# Patient Record
Sex: Male | Born: 1935 | Race: White | Hispanic: No | Marital: Married | Smoking: Former smoker
Health system: Southern US, Community
[De-identification: ages and names within clinical notes are randomized; demographics above are authoritative.]

## PROBLEM LIST (undated history)

## (undated) DIAGNOSIS — H3530 Unspecified macular degeneration: Secondary | ICD-10-CM

## (undated) DIAGNOSIS — N400 Enlarged prostate without lower urinary tract symptoms: Secondary | ICD-10-CM

## (undated) DIAGNOSIS — Z9289 Personal history of other medical treatment: Secondary | ICD-10-CM

## (undated) DIAGNOSIS — M1990 Unspecified osteoarthritis, unspecified site: Secondary | ICD-10-CM

## (undated) DIAGNOSIS — E785 Hyperlipidemia, unspecified: Secondary | ICD-10-CM

## (undated) DIAGNOSIS — K56609 Unspecified intestinal obstruction, unspecified as to partial versus complete obstruction: Principal | ICD-10-CM

## (undated) DIAGNOSIS — R803 Bence Jones proteinuria: Secondary | ICD-10-CM

## (undated) DIAGNOSIS — R531 Weakness: Secondary | ICD-10-CM

## (undated) DIAGNOSIS — G629 Polyneuropathy, unspecified: Secondary | ICD-10-CM

## (undated) DIAGNOSIS — J90 Pleural effusion, not elsewhere classified: Secondary | ICD-10-CM

## (undated) DIAGNOSIS — I2510 Atherosclerotic heart disease of native coronary artery without angina pectoris: Secondary | ICD-10-CM

## (undated) DIAGNOSIS — R569 Unspecified convulsions: Secondary | ICD-10-CM

## (undated) DIAGNOSIS — R809 Proteinuria, unspecified: Secondary | ICD-10-CM

## (undated) HISTORY — DX: Proteinuria, unspecified: R80.9

## (undated) HISTORY — DX: Pleural effusion, not elsewhere classified: J90

## (undated) HISTORY — DX: Atherosclerotic heart disease of native coronary artery without angina pectoris: I25.10

## (undated) HISTORY — DX: Hyperlipidemia, unspecified: E78.5

## (undated) HISTORY — DX: Unspecified intestinal obstruction, unspecified as to partial versus complete obstruction: K56.609

## (undated) HISTORY — DX: Weakness: R53.1

## (undated) HISTORY — DX: Unspecified convulsions: R56.9

## (undated) HISTORY — DX: Bence Jones proteinuria: R80.3

---
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
## 1990-09-20 HISTORY — PX: TRANSURETHRAL RESECTION OF PROSTATE: SHX73

## 1995-09-21 HISTORY — PX: ANKLE FRACTURE SURGERY: SHX122

## 1998-09-20 HISTORY — PX: HAND SURGERY: SHX662

## 1999-07-07 ENCOUNTER — Ambulatory Visit (HOSPITAL_BASED_OUTPATIENT_CLINIC_OR_DEPARTMENT_OTHER): Admission: RE | Admit: 1999-07-07 | Discharge: 1999-07-07 | Payer: Self-pay | Admitting: Orthopedic Surgery

## 2002-01-18 ENCOUNTER — Ambulatory Visit (HOSPITAL_COMMUNITY): Admission: RE | Admit: 2002-01-18 | Discharge: 2002-01-18 | Payer: Self-pay | Admitting: Gastroenterology

## 2002-01-18 ENCOUNTER — Encounter (INDEPENDENT_AMBULATORY_CARE_PROVIDER_SITE_OTHER): Payer: Self-pay | Admitting: Specialist

## 2002-09-20 DIAGNOSIS — Z9289 Personal history of other medical treatment: Secondary | ICD-10-CM

## 2002-09-20 HISTORY — DX: Personal history of other medical treatment: Z92.89

## 2002-09-20 HISTORY — PX: CORONARY ARTERY BYPASS GRAFT: SHX141

## 2003-01-19 DIAGNOSIS — I2510 Atherosclerotic heart disease of native coronary artery without angina pectoris: Secondary | ICD-10-CM

## 2003-01-19 HISTORY — DX: Atherosclerotic heart disease of native coronary artery without angina pectoris: I25.10

## 2003-01-27 ENCOUNTER — Encounter: Payer: Self-pay | Admitting: Emergency Medicine

## 2003-01-27 ENCOUNTER — Inpatient Hospital Stay (HOSPITAL_COMMUNITY): Admission: EM | Admit: 2003-01-27 | Discharge: 2003-02-02 | Payer: Self-pay | Admitting: Emergency Medicine

## 2003-01-27 HISTORY — PX: CARDIAC CATHETERIZATION: SHX172

## 2003-01-28 ENCOUNTER — Encounter: Payer: Self-pay | Admitting: Thoracic Surgery (Cardiothoracic Vascular Surgery)

## 2003-01-28 ENCOUNTER — Encounter: Payer: Self-pay | Admitting: Cardiothoracic Surgery

## 2003-01-29 ENCOUNTER — Encounter: Payer: Self-pay | Admitting: Thoracic Surgery (Cardiothoracic Vascular Surgery)

## 2003-01-30 ENCOUNTER — Encounter: Payer: Self-pay | Admitting: Thoracic Surgery (Cardiothoracic Vascular Surgery)

## 2003-02-01 ENCOUNTER — Encounter: Payer: Self-pay | Admitting: Thoracic Surgery (Cardiothoracic Vascular Surgery)

## 2003-02-25 ENCOUNTER — Encounter (HOSPITAL_COMMUNITY): Admission: RE | Admit: 2003-02-25 | Discharge: 2003-05-26 | Payer: Self-pay | Admitting: Cardiology

## 2010-07-30 ENCOUNTER — Ambulatory Visit: Payer: Self-pay | Admitting: Cardiology

## 2011-01-13 ENCOUNTER — Telehealth: Payer: Self-pay | Admitting: Cardiology

## 2011-01-13 DIAGNOSIS — E785 Hyperlipidemia, unspecified: Secondary | ICD-10-CM

## 2011-01-13 MED ORDER — ATORVASTATIN CALCIUM 40 MG PO TABS: 40.0000 mg | ORAL_TABLET | Freq: Every day | ORAL | Status: DC

## 2011-01-13 NOTE — Telephone Encounter (Signed)
Patient would like to pick up a prescription for Lipitor. He also has a question regarding the generic of Lipitor

## 2011-01-13 NOTE — Telephone Encounter (Signed)
Called wanting RX for Lipitor so he can mail to [facility]. Not sure if he wants generic. Will print Rx to mail to pt.

## 2011-01-22 ENCOUNTER — Other Ambulatory Visit: Payer: Self-pay | Admitting: Cardiology

## 2011-01-22 NOTE — Telephone Encounter (Signed)
LM that he did not get Rx Lipitor.  Left him a message that was mailed out same day I spoke w/ him.  Apparently address has changed. Updated by front office. LM to call back if needs for me to mail another RX.

## 2011-01-22 NOTE — Telephone Encounter (Signed)
Pt wants to know if you mailed the Lipitor prescription to him yet. He has not received it.

## 2011-01-22 NOTE — Telephone Encounter (Signed)
PATIENT'S DEMOGRAPHICS HAVE BEEN UPDATED - ADDRESS CHANGE.  This is why he did not get his Lipitor prescription

## 2011-01-25 ENCOUNTER — Other Ambulatory Visit: Payer: Self-pay | Admitting: Cardiology

## 2011-01-25 DIAGNOSIS — E785 Hyperlipidemia, unspecified: Secondary | ICD-10-CM

## 2011-01-25 MED ORDER — ATORVASTATIN CALCIUM 40 MG PO TABS: 40.0000 mg | ORAL_TABLET | Freq: Every day | ORAL | Status: DC

## 2011-01-25 NOTE — Telephone Encounter (Signed)
Called stating he never received Rx for Lipitor. Apparently we did not have current mailing address. Will reprint Rx and he will come by to pick up.

## 2011-01-25 NOTE — Telephone Encounter (Signed)
CALL PATIENT ABOUT A SCRIPT THAT WAS SENT TO THE WRONG ADDRESS.

## 2011-02-05 NOTE — H&P (Signed)
Brandon Ray NO.:  [id number]   MEDICAL RECORD NO.:  [id number]                   PATIENT TYPE:  EMS   LOCATION:  [facility]                                 FACILITY:  [facility]   PHYSICIAN:  Peter M. Martinique, M.D.               DATE OF BIRTH:  01/10/1935   DATE OF ADMISSION:  01/27/2003  DATE OF DISCHARGE:                                HISTORY & PHYSICAL   HISTORY OF PRESENT ILLNESS:  [name] is a 76 year old white male  generally in good health who presents with refractory chest pain today. He  states his pain began Friday morning at work. He thought it improved after  taking Tums; however, after lunch, it recurred and he presented to [facility] where apparently ECG was normal. The patient was given a  prescription for Aciphex and scheduled for followup stress test this week;  however, he reports that over the weekend his pain has never resolved but  has waxed and waned in intensity. Today, his pain became more severe, rated  as a 7/10 and was unrelenting, and he came to the emergency room. The  patient is described as mid sternal with radiation to both arms. Describes  it as a severe heaviness. He has no shortness of breath, nausea, vomiting,  diaphoresis. He has no prior history of cardiac disease.   PAST MEDICAL HISTORY:  1. Significant for seizure disorder.  2. He has had a history of prostate surgery for BPH.  3. He has had a tendon release in his left hand.  4. He has a remote left foot injury.   CURRENT MEDICATIONS:  1. Dilantin 200 mg q.h.s.  2. Phenobarbital 100 mg q.h.s.  3. Aspirin 81 mg per day.   He has no known allergies.   FAMILY HISTORY:  His father died in his 84s with myocardial infarction.  Mother died of rheumatic heart disease at age 73.   SOCIAL HISTORY:  The patient works as a Pharmacist, hospital.  He is married and has four children. He quit smoking in 1965 and rarely  drinks  alcohol.   REVIEW OF SYSTEMS:  He reports his prior cholesterol level was 205. He has  no history of hypertension or diabetes. He is active and exercises  regularly. He has had some intermittent right groin discomfort but reports  he had evaluation by surgery, and they felt that he did not have a hernia.  Other review of systems are negative.   PHYSICAL EXAMINATION:  GENERAL:  The patient is a pleasant white male in mid  distress.  VITAL SIGNS:  Blood pressure is 132/72, pulse is 80 and regular.  Respirations are 20. He is afebrile.  HEENT:  He wears glasses. Pupils are equal, round, and reactive.  Conjunctivae are clear. Oropharynx is clear.  NECK:  Without JVD, adenopathy, or bruits.  LUNGS:  Clear.  CARDIAC:  Reveals regular rate and rhythm without murmurs, rubs, gallops, or  clicks.  ABDOMEN:  Soft and nontender. Bowel sounds are positive.  EXTREMITIES:  Without edema. Pulses are 2+ and symmetric.  NEUROLOGICAL:  Intact.   LABORATORY DATA:  ECG shows normal sinus rhythm with incomplete right bundle  branch block and nonspecific ST abnormalities. Chest x-ray shows no active  disease. White count is 4,900, hemoglobin 14.7, hematocrit 43.9, platelets  235,000. Sodium 140, potassium 4.0, chloride 105, CO2 29, BUN 14, creatinine  0.8, glucose 107. LFTs are normal. Lipase is normal at 19. CK is 132 with  5.5 MB, troponin 0.23.   IMPRESSION:  1. Non-Q-wave myocardial infarction with ongoing chest pain.  2. Mild hypercholesterolemia.  3. History of seizure disorder.   PLAN:  The patient will be admitted to the transitional care unit. He will  be treated aggressively with antianginal therapy including aspirin, subcu  Lovenox, IV Lopressor, IV Integrilin, and IV nitroglycerin. Followup lipid  panel. Will plan on cardiac catheterization in the morning.                                               Peter M. Martinique, M.D.    PMJ/MEDQ  D:  01/27/2003  T:  01/29/2003  Job:  [id number]    cc:   Lennette Bihari L. Little, M.D.  [address]  [place]  [place] [address]  Fax: [phone]

## 2011-02-05 NOTE — Cardiovascular Report (Signed)
Brandon Ray, Brandon Ray NO.:  [id number]   MEDICAL RECORD NO.:  [id number]                   PATIENT TYPE:  INP   LOCATION:  2303                                 FACILITY:  [facility]   PHYSICIAN:  Peter M. Martinique, M.D.               DATE OF BIRTH:  May 17, 1935   DATE OF PROCEDURE:  DATE OF DISCHARGE:                              CARDIAC CATHETERIZATION   INDICATIONS FOR PROCEDURE:  The patient is a 76 year old white male who  presented with a non Q wave myocardial infarction with ongoing chest pain.  Despite aggressive medical therapy, his pain intensified, and so emergent  cardiac catheterization was recommended.   ACCESS:  Via the right femoral artery using the standard Seldinger  technique.   EQUIPMENT:  1. 6 French 4 cm right and left Judkins catheter.  2. 6 French pigtail catheter.  3. 7 French arterial sheath .   MEDICATIONS:  The patient was  on continuous IV infusion with Integrilin.  Started on a heparin drip at 1200 units per hour. He had already received a  bolus of heparin with and ACT of 327. He was  on IV nitroglycerin at 16  drops per hour. He received Lopressor 5 mg IV, morphine  2 mg IV.   CONTRAST:  125 cc Omnipaque.   HEMODYNAMIC DATA:  Aortic pressure was 124/68 with a mean of 94. Left  ventricular pressure was 121 with an EDP of 128 mmHg.   ANGIOGRAPHIC DATA:  The left coronary artery arises and distributes  normally. The left main coronary has diffuse narrowing up to 30% to 40%. The  left anterior descending artery is diffusely diseased. There is 30%  narrowing proximally. The mid vessel is severely diseased in a segmental  fashion up to 90% with moderate calcification. The 1st diagonal branch is a  moderate sized vessel which has 60% to 70% disease in the proximal vessel.   The left circumflex coronary artery gives rise to 2 marginal branches. The  proximal left circumflex coronary has a focal 95% stenosis. There is a 60%  stenosis in the 1st obtuse marginal branch.   The right coronary artery has moderate irregularities in the proximal to mid  vessel up to 20%. In the distal vessel there is a focal 50% stenosis prior  to  the terminal bifurcation.   Left ventricular angiography is performed in the RAO and LAO cranial views.  This demonstrates normal left ventricular size with mild focal lateral wall  hypokinesia. Overall ejection fraction is estimated at 60%. There is no  mitral regurgitation or prolapse.   FINAL INTERPRETATION:  1. Severe 3-vessel obstructive atherosclerotic coronary artery disease.  2. Good biventricular systolic function.   PLAN:  At this point the patient's chest pain had resolved. His ECG has  remained stable. He is transferred to the coronary intensive care unit for  further medical stabilization. I will obtain a surgical consultation.  Peter M. Martinique, M.D.    PMJ/MEDQ  D:  01/28/2003  T:  01/30/2003  Job:  [id number]   cc:   Lennette Bihari L. Little, M.D.  [address]  [place]  [place] [address]  Fax: [place]. Roxan Hockey, M.D.  [address]  [place]  [place] [address]  Fax: [phone]

## 2011-02-05 NOTE — Procedures (Signed)
[facility]  Patient:    Brandon Ray, Brandon Ray Visit Number: [id number] MRN: [id number]          Service Type: END Location: ENDO Attending Physician:  Sherrin Daisy Dictated by:   Joyice Faster. Oletta Lamas, M.D. Proc. Date: 01/18/02 Admit Date:  01/18/2002   CC:         Lennette Bihari L. Little, M.D.   Procedure Report  DATE OF BIRTH:  [birth year]  PROCEDURE:  Colonoscopy and coagulation of polyp.  ENDOSCOPIST:  Joyice Faster. Edwards, M.D.  MEDICATIONS:  Fentanyl 50 mcg, Versed 3 mg IV.  SCOPE:  Olympus adult videocolonoscope.  INDICATION:  Colon cancer screening.  DESCRIPTION OF PROCEDURE:  The procedure had been explained to the patient and consent obtained.  With the patient in the left lateral decubitus position, the Olympus adult videocolonoscope inserted, advanced under direct visualization.  Prep was excellent and was able to reach to the cecum. Ileocecal valve and appendiceal orifice seen.  The scope withdrawn.  The cecum, ascending colon, hepatic flexure, transverse colon, splenic flexure, descending and sigmoid colon seen well.  Free of polyps.  Scattered diverticula in the sigmoid colon.  Scope withdrawn to the rectum.  In the proximal rectum, a 4-mm sessile polyp that was cauterized.  No other polyp was seen.  The scope withdrawn.  The patient tolerated the procedure well and maintained on low-flow oxygen and pulse oximeter throughout the procedure.  ASSESSMENT:  Rectal polyp cauterized.  PLAN:  Will check pathology and probably recommend repeat colonoscopy in three years. Dictated by:   Joyice Faster. Oletta Lamas, M.D. Attending Physician:  Sherrin Daisy DD:  01/19/02 TD:  01/19/02 Job: [id number]

## 2011-02-05 NOTE — Discharge Summary (Signed)
Brandon Ray, Brandon Ray                         ACCOUNT NO.:  [id number]   MEDICAL RECORD NO.:  [id number]                   PATIENT TYPE:  INP   LOCATION:  2037                                 FACILITY:  [facility]   PHYSICIAN:  Revonda Standard. Roxan Hockey, M.D.         DATE OF BIRTH:  1935/01/06   DATE OF ADMISSION:  01/27/2003  DATE OF DISCHARGE:  02/02/2003                                 DISCHARGE SUMMARY   PHYSICIAN:  1. Revonda Standard Roxan Hockey, M.D., admitting doctor.  2. Priscille Heidelberg. Little, M.D., primary doctor.  3. Peter M. Martinique, M.D., cardiologist.   DISCHARGE DIAGNOSES:  1. Non-Q-wave myocardial infarction on this admission.  2. Unstable angina.  3. Preserved left ventricular function.  4. Severe three vessel coronary artery disease.  5. Acute blood loss anemia postoperatively requiring transfusion.   SECONDARY DIAGNOSES:  1. History of seizure disorder.  2. Benign prostatic hypertrophy, status post prostate surgery.  3. Status post tendon release left hand.  4. Remote left foot injury.  5. Remote history of tobacco abuse.   PROCEDURE:  1. Jan 27, 2003, left heart catheterization, left ventriculogram, and     coronary angiography with Dr. Peter M. Martinique, cardiologist.  The study     showed that the left main has a 30-40% diffuse narrowing, left anterior     descending has a 30% narrowing proximally.  The midpoint has a segmental     disease up to 90%.  The first diagonal is moderate sized and has a 60-70%     proximal stenosis.  The left circumflex has a 95% focal proximal     stenosis.  There is a 60% stenosis in the first obtuse marginal.  The     right coronary artery has a 50% stenosis in the distal vessel.  Left     ventriculogram shows ejection fraction estimated at 60%.  The patient has     been maintained on Integrilin and heparin drip as well as IV     nitroglycerin.  He has also been started on Lopressor and Morphine     sulfate.  2. Jan 28, 2003, carotid duplex  ultrasound.  The study shows no     hemodynamically significant internal carotid artery stenosis bilaterally     and his ankle brachial indexes are greater than 1.0 bilaterally.  3. Jan 28, 2003, coronary artery bypass graft surgery x 4 by Dr. Remo Lipps C.     Hendrickson.  In this procedure, the left internal mammary artery was     connected in an end-to-side fashion to the left anterior descending     artery.  Reverse saphenous vein grafts were fashioned from the aorta,     diagonal, to the first obtuse marginal, and to the right coronary artery.     The patient tolerated the procedure well and transferred to the intensive     care unit.  He did have postoperative coagulopathy requiring transfusion  with one unit of packed red blood cells and he was maintained on IV     dopamine and Neo-Synephrine.   DISPOSITION:  The patient is ready for discharge on postoperative day five.  He did have a fever spike on the midnight postoperative day three but it had  abated with Tylenol and had not recurred.  The patient has been on room air  and has ambulated without desaturation.  The patient has maintained sinus  rhythm in the postoperative period.  He did have postoperative acute blood  loss anemia which has corrected his hemoglobin on postoperative day 4 at  9.4.  He did receive transfusions both on the day of surgery and on  postoperative day three for hemoglobin of 7.3.  He is ambulating  independently.  His incisions are healing nicely.  He is taking oral  nourishment and has full bowel function.  The patient will go home on the  following medications.   DISCHARGE MEDICATIONS:  1. Oxycodone 5 mg 1-2 tablets q.4-6h. p.r.n. pain.  2. Toprol XL 25 mg q.d.  3. Lipitor 10 mg q.d. preferably at bed time.  4. Enteric-coated aspirin 325 mg q.d.  5. Dilantin 200 mg at bed time.  6. Phenobarbital 100 mg at bed time.  7. Lasix 40 mg q.d.  8. Potassium chloride 40 mEq.   ACTIVITY:  Both the  diuretic and potassium supplement for a 10-day course.  The patient did have volume overload in the postoperative period and by  postoperative day four was still 11 pounds fluid positive.  The patient is  to walk daily to keep up his strength.  He is asked not to lift more than 10  pounds for the next six weeks.   DIET:  Low sodium, low cholesterol diet.  He may shower.  He is to call Dr.  Remo Lipps C. Hendrickson's office if his incision becomes inflamed or starts to  drain.  He will see Dr. Peter M. Martinique in the office in two weeks and he  will see Dr. Remo Lipps C. Hendrickson on Thursday, February 21, 2003 at 11:45 in the  morning.   HISTORY OF PRESENT ILLNESS:  The patient is a 76 year old male who was in  generally good health but presented to [facility] on Jan 27, 2003 with refractory chest pain.  He says the pain began in the morning  at work and thought it might improve after taking some Tums.  However, after  lunch, it recurred and he presented to the [facility] where an  electrocardiogram was normal.   He was given a prescription for Aciphex and is scheduled for a follow-up  stress test.  However, over the weekend, the pain has not resolved and has  waxed and waned in intensity.   On the day of admission, his pain became more severe, rated as a 7 over 10.  It was unrelenting and he came to the emergency room at [facility].  The patient described that the pain is midsternal with radiation  to both arms.  He describes it as severe heaviness.  He has no shortness of  breath, nausea, vomiting, or diaphoresis.  The patient has no prior history  of cardiac disease.   HOSPITAL COURSE:  After admission to [facility] through  the emergency room with unstable angina and ruled in for myocardial  infarction on Jan 27, 2003, he was admitted to [facility] by Dr.  Peter M. Martinique.  He underwent left heart  catheterization the same  day which showed severe three vessel atherosclerotic coronary artery disease  with preserved left ventricular function.  He was placed on the following  drips, IV nitroglycerin, Integrilin, heparin, and was given IV Lopressor and  IV Morphine sulfate.   Dr. Revonda Standard. Roxan Hockey of [facility] was called in consultation.  After reviewing the CINE films, he  agreed that the patient would benefit from revascularization surgery.  This  was scheduled after the carotid duplex ultrasound was obtained on Jan 28, 2003.  Four bypasses were placed.   The patient has had a fairly unremarkable recovery.  After the surgery, he  was maintained on IV dopamine and Neo-Synephrine for hypertension.  These  were weaned by postoperative day two.  He had a coagulopathy immediately  postoperatively and was transfused with one unit of packed red blood cells.  He had acute blood loss anemia the postoperative period and was transfused  with a second unit of packed red blood cells on postoperative day three for  hemoglobin in the 7.3.   On postoperative day four, his hemoglobin had risen to 9.4.  He maintained  sinus rhythm in the postoperative period and has had no trouble weaning off  oxygen. He was on room air by postoperative day three and has not  desaturated with ambulation after that.  He has had volume overload which  has responded to diuresis throughout the postoperative period, but he will  need continued diuresis for approximately 10 days as an outpatient.  The  patient goes home with the medications and follow-up as dictated above.      Sueanne Margarita, P.A.                    Revonda Standard Roxan Hockey, M.D.    GM/MEDQ  D:  02/01/2003  T:  02/02/2003  Job:  [id number]   cc:   Revonda Standard. Roxan Hockey, M.D.  [address]  [place]  [place] [address]  Fax: [place]. Little, M.D.  [address]  [place]  [place] [address]  Fax: [phone]   Peter M. Martinique, M.D.  [phone], [place], [place] [address]  Fax: [phone]

## 2011-02-05 NOTE — Op Note (Signed)
Brandon Ray, Brandon Ray                         ACCOUNT NO.:  [id number]   MEDICAL RECORD NO.:  [id number]                   PATIENT TYPE:  INP   LOCATION:  2303                                 FACILITY:  [facility]   PHYSICIAN:  Revonda Standard. Roxan Hockey, M.D.         DATE OF BIRTH:  [birth year]   DATE OF PROCEDURE:  01/28/2003  DATE OF DISCHARGE:                                 OPERATIVE REPORT   PREOPERATIVE DIAGNOSIS:  Three vessel coronary disease status post  myocardial infarction.   POSTOPERATIVE DIAGNOSIS:  Three vessel coronary disease status post  myocardial infarction.   PROCEDURE:  Median sternotomy, extracorporeal circulation, coronary artery  bypass grafting x 4 (left internal mammary artery to left anterior  descending , saphenous vein graft to first diagonal, saphenous vein graft to  first obtuse marginal, saphenous vein graft to posterior descending),  endoscopic vein harvest from right thigh, open vein harvest left groin.   SURGEON:  Revonda Standard. Roxan Hockey, M.D.   ASSISTANT:  Sueanne Margarita, P.A.   ANESTHESIA:  General.   FINDINGS:  The LAD and diagonal were small, fair quality vessels.  OM1 and  posterior descending good quality vessels, OM2 and posterolateral too small  to graft.  Good quality conduit from the mammary and right thigh, right  lower leg vein varicosities and sclerotic areas, not usable, good quality  vein from the left groin.   CLINICAL NOTE:  Brandon Ray is a 76 year old gentleman who presented with  waxing and waning chest pain.  The pain became severe on Sunday evening and  he presented and ruled in for MI by enzymes.  He was taken to the cardiac  catheterization laboratory where he was found to have severe three vessel  disease.  At that point, he had stabilized clinically and was having no  further pain.  The patient remained stable overnight and was seen and  evaluated on the morning of Jan 28, 2003.  He was advised to undergo  coronary  artery bypass grafting for survival benefit and relief of symptoms.  He understood the indications, risks, benefits, and alternatives, accepted  the risks, and agreed to proceed with surgery.   OPERATIVE NOTE:  [name] was brought from the CCU to the preop holding  area on Jan 29, 2003.  Lines were placed to monitor arterial, central venous  and pulmonary arterial pressure.  EKG leads were placed for continuous  telemetry.  The patient was taken to the operating room, anesthetized, and  intubated.  A Foley catheter was placed.  Intravenous  antibiotics were  administered.  The chest, abdomen, and legs were prepped and draped in the  usual fashion.  A median sternotomy was performed and the left internal  mammary artery was harvested using standard technique.  This was small  caliber but good quality vessel.  Simultaneously, an incision was made in  the medial aspect of the right leg.  The greater saphenous vein was  harvested from the right thigh endoscopically.  An additional segment was  harvested open from the right lower leg but this was not usable.  Therefore,  an additional segment was harvested from the left groin which was of good  quality.   After harvesting the conduits, the pericardium was opened.  The ascending  aorta was inspected and palpated, there was no palpable atherosclerotic  disease.  The aorta was cannulated via concentric 2-0 Ethibond pledgeted  purse-string sutures.  A dual stage venous cannula was placed through a  purse-string suture in the right atrial appendage.  Cardiopulmonary bypass  was instituted and the patient was cooled to 32 degrees Celsius.  The  coronary arteries were inspected and anastomotic sites were chosen.  The  conduits were inspected and cut to length.  A foam pad was placed in the  pericardium to protect the left phrenic nerve.  A temperature probe was  placed in the myocardial septum.  A cardioplegic cannula was placed in the   ascending aorta.  The aorta was crossclamped, the left ventricle was emptied  via the aortic root vent, cardiac arrest was then achieved with a  combination of cold antegrade blood cardioplegia and topical iced saline.  500 mL of cardioplegia was administered a and the myocardial surface  temperature was 10 degrees Celsius.  The following distal anastomoses were  performed:  First, a reversed saphenous vein graft was placed end-to-side to  the posterior descending branch of the right coronary, posterior descending  of the 1.5 mm good quality target.  The anastomosis was performed end-to-  side with a running segment of Prolene suture.  There was excellent flow  through the graft, cardioplegia was administered, and there was good  hemostasis.   Next, a reversed saphenous vein graft was placed end-to-side to obtuse  marginal one, this vessel was compromised by a tight 99% stenosis in the  circumflex, a 1.5 mm probe did pass retrograde to the level of the tight  stenosis.  This was a 1.5 mm good quality target.  The vein graft was a good  quality.  The anastomosis was performed with a running 7-0 Prolene suture.  There was excellent flow through the graft, cardioplegia was administered,  and there was good hemostasis at the anastomosis.   Next, a reversed saphenous vein graft was placed end-to-side to the first  diagonal branch of the LAD.  This diagonal was a diffusely diseased 1.3 mm  fair quality target.  The vein graft was anastomosed to the diagonal using a  running 7-0 Prolene.  There was good flow through the graft, cardioplegia  was administered, and there was good hemostasis at the anastomosis.   Next, the left internal mammary artery was brought through a window in the  pericardium, the distal end was spatulated, it was anastomosed end-to-side  to the distal LAD.  The LAD Was a 1.3 mm small fair quality target.  The anastomosis was performed with a running 8-0 Prolene suture.  At  the  completion of the mammary to the LAD anastomosis the clamp was removed from  the mammary artery, there was an excellent flash of blood in the distal  vessel and immediately rapid septal rewarming was noted, the bulldog clamp  was replaced.  The mammary pedicle was tacked to the epicardial surface of  the heart with 6-0 Prolene sutures.   The myocardial septal temperature was still 14 degrees Celsius.  No  additional cardioplegia was administered.  The cardioplegia cannula was  removed from the ascending aorta, the vein grafts were cut to length, and  the proximal vein graft anastomoses were performed to 4 mm punch aortotomies  with running 6-0 Prolene sutures.  At the completion of the final proximal  anastomosis, the patient was placed in Trendelenburg position.  The bulldog  clamp was removed from the mammary artery, immediate rewarming was noted.  Lidocaine was administered.  The aortic root was deaired and the aortic  Cross clamp was removed, total crossclamp time was 69 minutes.  The patient  required a single defibrillation with 20 joules and then resumed sinus  rhythm.  All proximal and distal anastomoses were inspected for hemostasis.  Epicardial pacing wires were placed on the right ventricle and right atrium.  A dopamine drip was initiated at 5 mcg per kg per minute.  When the patient  had been rewarmed to 37 degrees Celsius, he was weaned from cardiopulmonary  bypass without difficulty.  Total bypass time was 135 minutes.  The initial  cardiac index was greater than 2 liters per minute per meter squared and the  patient remained hemodynamically stable throughout post bypass.   A test dose of protamine was administered and was well tolerated.  The  atrial and aortic cannulae were removed.  There was good hemostasis at both  cannulation sites.  The remainder of the protamine was administered without  incident.  The chest was irrigated with 1 liter of warm normal saline and  1  gram vancomycin.  Hemostasis was achieved.  The pericardium was  reapproximated with interrupted 3-0 silk sutures.  Left pleural and two  mediastinal chest tubes were placed through separate subcostal incisions.  The sternum was closed with heavy gauge stainless steel wires.  The  pectoralis fascia was closed with running #1 Vicryl suture.  The  subcutaneous tissue was closed  with running 2-0 Vicryl suture and the skin was closed 0 Vicryl subcuticular  suture.  All sponge, instrument, and needle counts were correct at the end  of the procedure.  The patient remained hemodynamically stable and was taken  from the operating room to the surgical intensive care unit in critical but  stable condition.                                               Revonda Standard Roxan Hockey, M.D.    SCH/MEDQ  D:  01/28/2003  T:  01/29/2003  Job:  [id number]   cc:   Peter M. Martinique, M.D.  [address], [place],  [address] Fax: [phone]

## 2011-03-05 ENCOUNTER — Encounter: Payer: Self-pay | Admitting: Cardiology

## 2011-03-11 ENCOUNTER — Encounter: Payer: Self-pay | Admitting: Cardiology

## 2011-03-11 ENCOUNTER — Ambulatory Visit (INDEPENDENT_AMBULATORY_CARE_PROVIDER_SITE_OTHER): Payer: BC Managed Care – PPO | Admitting: Cardiology

## 2011-03-11 DIAGNOSIS — I2510 Atherosclerotic heart disease of native coronary artery without angina pectoris: Secondary | ICD-10-CM | POA: Insufficient documentation

## 2011-03-11 DIAGNOSIS — E785 Hyperlipidemia, unspecified: Secondary | ICD-10-CM | POA: Insufficient documentation

## 2011-03-11 NOTE — Progress Notes (Signed)
   Brandon Ray Date of Birth: 1935/09/10   History of Present Illness: Brandon Ray is seen today for followup. He continues to do very well. He retired this past Thursday and is looking forward to more time travel. He does note some achiness in his legs and wonders if this is related to his Lipitor. He has had no chest pain or shortness of breath.  Current Outpatient Prescriptions on File Prior to Visit  Medication Sig Dispense Refill  . aspirin 81 MG tablet Take 81 mg by mouth daily. 2 DAILY       . atorvastatin (LIPITOR) 40 MG tablet Take 1 tablet (40 mg total) by mouth daily.  90 tablet  3  . calcium-vitamin D (OSCAL WITH D) 500-200 MG-UNIT per tablet Take 1 tablet by mouth daily.        . Coenzyme Q10 (COQ10) 400 MG CAPS Take by mouth daily.        . ergocalciferol (VITAMIN D2) 50000 UNITS capsule Take 2,000 Units by mouth once a week.       . fish oil-omega-3 fatty acids 1000 MG capsule Take 1,200 mg by mouth daily.        . folic acid (FOLVITE) 300 MCG tablet Take 400 mcg by mouth daily.        . Multiple Vitamin (MULTIVITAMIN) tablet Take 1 tablet by mouth daily.        . Multiple Vitamins-Minerals (PRESERVISION AREDS PO) Take by mouth daily. 2 DAILY       . PHENobarbital (LUMINAL) 97.2 MG tablet Take 97.2 mg by mouth daily.        . phenytoin (DILANTIN) 100 MG ER capsule Take 200 mg by mouth daily.          No Known Allergies  Past Medical History  Diagnosis Date  . Coronary artery disease   . Hyperlipidemia   . Seizure disorder     Past Surgical History  Procedure Date  . Cardiac catheterization 01/27/2003    NORMAL LEFT VENTRICULAR SIZE WITH MILD FOCAL LATERAL WALL HYPOKINESIA. EF 60%  . Coronary artery bypass graft 2004    LIMA GRAFT TO THE LAD, SAPHENOUS VEIN GRAFT TO THE DIAGONAL, SAPHENOUS VEIN GRAFT TO THE FIRST OM, SAPHENOUS VEIN GRAFT TO THE PDA  . Transurethral resection of prostate   . Hand surgery     History  Smoking status  . Former Smoker  . Quit  date: 03/05/1971  Smokeless tobacco  . Not on file    History  Alcohol Use No    Family History  Problem Relation Age of Onset  . Asthma Mother   . Heart disease Mother   . Coronary artery disease Father     Review of Systems: The review of systems is positive for myalgias.  All other systems were reviewed and are negative.  Physical Exam: BP 118/70  Pulse 70  Wt 187 lb (84.823 kg) He is a pleasant white male in no acute distress. HEENT exam is unremarkable. No JVD or bruits. Lungs are clear. Cardiac exam reveals a regular rate and rhythm without gallop or murmur. Abdomen is soft and nontender without mass or bruits. She remains without edema. Pedal pulses are good. He is alert and oriented x3. Cranial nerves II through XII are intact. LABORATORY DATA:   Assessment / Plan:

## 2011-03-11 NOTE — Assessment & Plan Note (Signed)
He is experiencing some myalgias and weakness. I recommended a trial off of Lipitor to see if his symptoms resolve. If so we will need to consider alternative therapy possibly with a lower dose statin and niacin. Otherwise we will follow up again in 6 months.

## 2011-03-11 NOTE — Patient Instructions (Signed)
Continue your current medications.  Get regular aerobic exercise.  I will see you back in 6 months.

## 2011-03-11 NOTE — Assessment & Plan Note (Signed)
He remains asymptomatic. His stress Cardiolite study in May of 2001 was normal. We will continue with his current therapy with aspirin and lipid-lowering therapy.

## 2011-05-22 DIAGNOSIS — K56609 Unspecified intestinal obstruction, unspecified as to partial versus complete obstruction: Secondary | ICD-10-CM

## 2011-05-22 DIAGNOSIS — J90 Pleural effusion, not elsewhere classified: Secondary | ICD-10-CM

## 2011-05-22 DIAGNOSIS — G629 Polyneuropathy, unspecified: Secondary | ICD-10-CM

## 2011-05-22 HISTORY — DX: Pleural effusion, not elsewhere classified: J90

## 2011-05-22 HISTORY — PX: ABDOMINAL SURGERY: SHX537

## 2011-05-22 HISTORY — DX: Unspecified intestinal obstruction, unspecified as to partial versus complete obstruction: K56.609

## 2011-05-22 HISTORY — DX: Polyneuropathy, unspecified: G62.9

## 2011-06-16 ENCOUNTER — Inpatient Hospital Stay (HOSPITAL_COMMUNITY)
Admission: EM | Admit: 2011-06-16 | Discharge: 2011-06-26 | DRG: 330 | Disposition: A | Discharge status: Home Health | Payer: Medicare Other | Attending: General Surgery | Admitting: General Surgery

## 2011-06-16 ENCOUNTER — Emergency Department (HOSPITAL_COMMUNITY): Payer: Medicare Other

## 2011-06-16 DIAGNOSIS — K565 Intestinal adhesions [bands], unspecified as to partial versus complete obstruction: Principal | ICD-10-CM | POA: Diagnosis present

## 2011-06-16 DIAGNOSIS — I10 Essential (primary) hypertension: Secondary | ICD-10-CM | POA: Diagnosis present

## 2011-06-16 DIAGNOSIS — K56609 Unspecified intestinal obstruction, unspecified as to partial versus complete obstruction: Secondary | ICD-10-CM

## 2011-06-16 DIAGNOSIS — N138 Other obstructive and reflux uropathy: Secondary | ICD-10-CM | POA: Diagnosis present

## 2011-06-16 DIAGNOSIS — J9819 Other pulmonary collapse: Secondary | ICD-10-CM | POA: Diagnosis not present

## 2011-06-16 DIAGNOSIS — K560 Paralytic ileus: Secondary | ICD-10-CM | POA: Diagnosis not present

## 2011-06-16 DIAGNOSIS — K929 Disease of digestive system, unspecified: Secondary | ICD-10-CM | POA: Diagnosis not present

## 2011-06-16 DIAGNOSIS — Z951 Presence of aortocoronary bypass graft: Secondary | ICD-10-CM

## 2011-06-16 DIAGNOSIS — J988 Other specified respiratory disorders: Secondary | ICD-10-CM | POA: Diagnosis not present

## 2011-06-16 DIAGNOSIS — R339 Retention of urine, unspecified: Secondary | ICD-10-CM | POA: Diagnosis not present

## 2011-06-16 DIAGNOSIS — I2510 Atherosclerotic heart disease of native coronary artery without angina pectoris: Secondary | ICD-10-CM | POA: Diagnosis present

## 2011-06-16 DIAGNOSIS — Z79899 Other long term (current) drug therapy: Secondary | ICD-10-CM

## 2011-06-16 DIAGNOSIS — Z7982 Long term (current) use of aspirin: Secondary | ICD-10-CM

## 2011-06-16 DIAGNOSIS — N401 Enlarged prostate with lower urinary tract symptoms: Secondary | ICD-10-CM | POA: Diagnosis present

## 2011-06-16 DIAGNOSIS — IMO0002 Reserved for concepts with insufficient information to code with codable children: Secondary | ICD-10-CM | POA: Diagnosis not present

## 2011-06-16 LAB — COMPREHENSIVE METABOLIC PANEL
AST: 32 U/L (ref 0–37)
Albumin: 4 g/dL (ref 3.5–5.2)
Alkaline Phosphatase: 95 U/L (ref 39–117)
BUN: 29 mg/dL — ABNORMAL HIGH (ref 6–23)
Potassium: 4.2 mEq/L (ref 3.5–5.1)
Sodium: 141 mEq/L (ref 135–145)
Total Protein: 7.3 g/dL (ref 6.0–8.3)

## 2011-06-16 LAB — DIFFERENTIAL
Basophils Absolute: 0 10*3/uL (ref 0.0–0.1)
Basophils Relative: 0 % (ref 0–1)
Eosinophils Absolute: 0 10*3/uL (ref 0.0–0.7)
Neutro Abs: 8.3 10*3/uL — ABNORMAL HIGH (ref 1.7–7.7)
Neutrophils Relative %: 83 % — ABNORMAL HIGH (ref 43–77)

## 2011-06-16 LAB — POCT I-STAT TROPONIN I: Troponin i, poc: 0.01 ng/mL (ref 0.00–0.08)

## 2011-06-16 LAB — CBC
MCH: 32.8 pg (ref 26.0–34.0)
Platelets: 217 10*3/uL (ref 150–400)
RBC: 4.7 MIL/uL (ref 4.22–5.81)
WBC: 10 10*3/uL (ref 4.0–10.5)

## 2011-06-16 LAB — LIPASE, BLOOD: Lipase: 18 U/L (ref 11–59)

## 2011-06-16 MED ORDER — IOHEXOL 300 MG/ML  SOLN
100.0000 mL | Freq: Once | INTRAMUSCULAR | Status: AC | PRN
  Administered 2011-06-16: 100 mL via INTRAVENOUS

## 2011-06-17 ENCOUNTER — Inpatient Hospital Stay (HOSPITAL_COMMUNITY): Payer: Medicare Other

## 2011-06-17 LAB — CBC
HCT: 43.3 % (ref 39.0–52.0)
Hemoglobin: 14.4 g/dL (ref 13.0–17.0)
MCH: 31.8 pg (ref 26.0–34.0)
MCHC: 33.3 g/dL (ref 30.0–36.0)
MCV: 95.6 fL (ref 78.0–100.0)
Platelets: 206 10*3/uL (ref 150–400)
RBC: 4.53 MIL/uL (ref 4.22–5.81)
RDW: 13.5 % (ref 11.5–15.5)
WBC: 8 10*3/uL (ref 4.0–10.5)

## 2011-06-17 LAB — BASIC METABOLIC PANEL
BUN: 21 mg/dL (ref 6–23)
CO2: 29 mEq/L (ref 19–32)
Calcium: 8.5 mg/dL (ref 8.4–10.5)
Chloride: 106 mEq/L (ref 96–112)
Creatinine, Ser: 0.75 mg/dL (ref 0.50–1.35)
GFR calc Af Amer: >60 mL/min (ref >60)
GFR calc non Af Amer: >60 mL/min (ref >60)
Glucose, Bld: 151 mg/dL — ABNORMAL HIGH (ref 70–99)
Potassium: 4.1 mEq/L (ref 3.5–5.1)
Sodium: 140 mEq/L (ref 135–145)

## 2011-06-17 LAB — DIFFERENTIAL
Basophils Absolute: 0 10*3/uL (ref 0.0–0.1)
Lymphocytes Relative: 8 % — ABNORMAL LOW (ref 12–46)
Monocytes Absolute: 0.5 10*3/uL (ref 0.1–1.0)
Neutro Abs: 6.8 10*3/uL (ref 1.7–7.7)

## 2011-06-18 ENCOUNTER — Inpatient Hospital Stay (HOSPITAL_COMMUNITY): Payer: Medicare Other

## 2011-06-18 DIAGNOSIS — K660 Peritoneal adhesions (postprocedural) (postinfection): Secondary | ICD-10-CM

## 2011-06-18 DIAGNOSIS — K565 Intestinal adhesions [bands], unspecified as to partial versus complete obstruction: Secondary | ICD-10-CM

## 2011-06-20 LAB — BASIC METABOLIC PANEL
BUN: 24 mg/dL — ABNORMAL HIGH (ref 6–23)
CO2: 28 mEq/L (ref 19–32)
Chloride: 102 mEq/L (ref 96–112)
Creatinine, Ser: 0.87 mg/dL (ref 0.50–1.35)

## 2011-06-20 LAB — CBC
HCT: 38.3 % — ABNORMAL LOW (ref 39.0–52.0)
MCV: 96 fL (ref 78.0–100.0)
RBC: 3.99 MIL/uL — ABNORMAL LOW (ref 4.22–5.81)
WBC: 8.4 10*3/uL (ref 4.0–10.5)

## 2011-06-22 DIAGNOSIS — R339 Retention of urine, unspecified: Secondary | ICD-10-CM

## 2011-06-22 LAB — BASIC METABOLIC PANEL
BUN: 10 mg/dL (ref 6–23)
Chloride: 103 mEq/L (ref 96–112)
Creatinine, Ser: 0.58 mg/dL (ref 0.50–1.35)
GFR calc Af Amer: >90 mL/min (ref >90)
Glucose, Bld: 141 mg/dL — ABNORMAL HIGH (ref 70–99)

## 2011-06-22 LAB — CBC
HCT: 33.2 % — ABNORMAL LOW (ref 39.0–52.0)
MCHC: 35.2 g/dL (ref 30.0–36.0)
MCV: 92.5 fL (ref 78.0–100.0)
RDW: 13.4 % (ref 11.5–15.5)
WBC: 6.8 10*3/uL (ref 4.0–10.5)

## 2011-06-22 LAB — HEPARIN INDUCED THROMBOCYTOPENIA PNL
UFH High Dose UFH H: 0 % Release
UFH Low Dose 0.1 IU/mL: 0 % Release

## 2011-06-23 ENCOUNTER — Inpatient Hospital Stay (HOSPITAL_COMMUNITY): Payer: Medicare Other

## 2011-06-23 LAB — URINALYSIS, ROUTINE W REFLEX MICROSCOPIC
Glucose, UA: NEGATIVE mg/dL
Nitrite: NEGATIVE
Protein, ur: 100 mg/dL — AB
Urobilinogen, UA: 1 mg/dL (ref 0.0–1.0)

## 2011-06-23 LAB — URINE MICROSCOPIC-ADD ON

## 2011-06-24 LAB — BASIC METABOLIC PANEL
CO2: 27 mEq/L (ref 19–32)
Chloride: 101 mEq/L (ref 96–112)
Creatinine, Ser: 0.56 mg/dL (ref 0.50–1.35)
GFR calc Af Amer: >90 mL/min (ref >90)
Sodium: 133 mEq/L — ABNORMAL LOW (ref 135–145)

## 2011-06-24 LAB — CBC
HCT: 37 % — ABNORMAL LOW (ref 39.0–52.0)
Hemoglobin: 12.6 g/dL — ABNORMAL LOW (ref 13.0–17.0)
MCH: 32 pg (ref 26.0–34.0)
MCHC: 34.1 g/dL (ref 30.0–36.0)
MCV: 93.9 fL (ref 78.0–100.0)
RBC: 3.94 MIL/uL — ABNORMAL LOW (ref 4.22–5.81)

## 2011-06-24 LAB — URINE CULTURE
Colony Count: NO GROWTH
Culture: NO GROWTH

## 2011-06-28 ENCOUNTER — Telehealth (INDEPENDENT_AMBULATORY_CARE_PROVIDER_SITE_OTHER): Payer: Self-pay

## 2011-06-28 ENCOUNTER — Telehealth (INDEPENDENT_AMBULATORY_CARE_PROVIDER_SITE_OTHER): Payer: Self-pay | Admitting: General Surgery

## 2011-06-28 NOTE — Telephone Encounter (Signed)
Rec'd call from pt wife stating that she wanted to move up the appoint set up for her husband. She stated the sister was the one that called and made the original appt. Advised her (after talking to [facility]) that the appt was the closest available for him and was just over the 3 week post op appt time based on his release date from the hospital. Advised her that if he develops any complications such as fever, discharge, redness/inflammation, or discharge with odor to call us. Spouse asked if there was a cancellation list, advised we did not have one, but if there was a cancellation that we would try to contact to advise.

## 2011-06-28 NOTE — Telephone Encounter (Signed)
Wife called with c/o bilateral  Ankle & Feet swelling. No pain- using compression hose- voiding- per Dr. Barry Dienes call primary provider.  Wife aware, she left message for follow up appointment with [facility]. RMP

## 2011-07-02 ENCOUNTER — Encounter (INDEPENDENT_AMBULATORY_CARE_PROVIDER_SITE_OTHER): Payer: Self-pay | Admitting: General Surgery

## 2011-07-02 ENCOUNTER — Other Ambulatory Visit (INDEPENDENT_AMBULATORY_CARE_PROVIDER_SITE_OTHER): Payer: Self-pay | Admitting: General Surgery

## 2011-07-02 ENCOUNTER — Telehealth (INDEPENDENT_AMBULATORY_CARE_PROVIDER_SITE_OTHER): Payer: Self-pay

## 2011-07-02 ENCOUNTER — Encounter (HOSPITAL_COMMUNITY): Payer: Self-pay

## 2011-07-02 ENCOUNTER — Inpatient Hospital Stay (HOSPITAL_COMMUNITY)
Admission: AD | Admit: 2011-07-02 | Discharge: 2011-07-15 | DRG: 371 | Disposition: A | Discharge status: Home Health | Payer: Medicare Other | Source: Ambulatory Visit | Attending: General Surgery | Admitting: General Surgery

## 2011-07-02 ENCOUNTER — Ambulatory Visit (HOSPITAL_COMMUNITY)
Admission: RE | Admit: 2011-07-02 | Discharge: 2011-07-02 | Disposition: A | Discharge status: Home / Self Care | Payer: Medicare Other | Source: Ambulatory Visit | Attending: General Surgery | Admitting: General Surgery

## 2011-07-02 ENCOUNTER — Ambulatory Visit (INDEPENDENT_AMBULATORY_CARE_PROVIDER_SITE_OTHER): Payer: Medicare Other | Admitting: General Surgery

## 2011-07-02 VITALS — BP 112/58 | HR 98 | Temp 97.2°F | Resp 16 | Ht 73.0 in | Wt 181.0 lb

## 2011-07-02 DIAGNOSIS — R1031 Right lower quadrant pain: Secondary | ICD-10-CM | POA: Insufficient documentation

## 2011-07-02 DIAGNOSIS — K651 Peritoneal abscess: Principal | ICD-10-CM | POA: Diagnosis present

## 2011-07-02 DIAGNOSIS — N400 Enlarged prostate without lower urinary tract symptoms: Secondary | ICD-10-CM | POA: Diagnosis present

## 2011-07-02 DIAGNOSIS — R509 Fever, unspecified: Secondary | ICD-10-CM

## 2011-07-02 DIAGNOSIS — J9819 Other pulmonary collapse: Secondary | ICD-10-CM | POA: Diagnosis present

## 2011-07-02 DIAGNOSIS — K560 Paralytic ileus: Secondary | ICD-10-CM | POA: Diagnosis present

## 2011-07-02 DIAGNOSIS — IMO0002 Reserved for concepts with insufficient information to code with codable children: Secondary | ICD-10-CM

## 2011-07-02 DIAGNOSIS — R5082 Postprocedural fever: Secondary | ICD-10-CM

## 2011-07-02 DIAGNOSIS — Z7982 Long term (current) use of aspirin: Secondary | ICD-10-CM

## 2011-07-02 DIAGNOSIS — J90 Pleural effusion, not elsewhere classified: Secondary | ICD-10-CM | POA: Insufficient documentation

## 2011-07-02 DIAGNOSIS — K56609 Unspecified intestinal obstruction, unspecified as to partial versus complete obstruction: Secondary | ICD-10-CM | POA: Insufficient documentation

## 2011-07-02 DIAGNOSIS — I2510 Atherosclerotic heart disease of native coronary artery without angina pectoris: Secondary | ICD-10-CM | POA: Diagnosis present

## 2011-07-02 DIAGNOSIS — B372 Candidiasis of skin and nail: Secondary | ICD-10-CM | POA: Diagnosis present

## 2011-07-02 DIAGNOSIS — R52 Pain, unspecified: Secondary | ICD-10-CM

## 2011-07-02 DIAGNOSIS — Z79899 Other long term (current) drug therapy: Secondary | ICD-10-CM

## 2011-07-02 DIAGNOSIS — J869 Pyothorax without fistula: Secondary | ICD-10-CM | POA: Diagnosis present

## 2011-07-02 DIAGNOSIS — K4090 Unilateral inguinal hernia, without obstruction or gangrene, not specified as recurrent: Secondary | ICD-10-CM | POA: Insufficient documentation

## 2011-07-02 DIAGNOSIS — Z01818 Encounter for other preprocedural examination: Secondary | ICD-10-CM

## 2011-07-02 LAB — CBC WITH DIFFERENTIAL/PLATELET
Hemoglobin: 12.6 g/dL — ABNORMAL LOW (ref 13.0–17.0)
Lymphs Abs: 0.6 10*3/uL — ABNORMAL LOW (ref 0.7–4.0)
MCH: 33.6 pg (ref 26.0–34.0)
MCHC: 35.2 g/dL (ref 30.0–36.0)
MCV: 95.2 fL (ref 78.0–100.0)
Monocytes Relative: 4 % (ref 3–12)
RBC: 3.75 MIL/uL — ABNORMAL LOW (ref 4.22–5.81)

## 2011-07-02 MED ORDER — IOHEXOL 300 MG/ML  SOLN
100.0000 mL | Freq: Once | INTRAMUSCULAR | Status: AC | PRN
  Administered 2011-07-02: 100 mL via INTRAVENOUS

## 2011-07-02 NOTE — Telephone Encounter (Signed)
Patient's wife called in and states they talked to Dr Harlow Asa last night.  He wanted him to have a CBC today and be seen in urgent office.  I will order the CBC and have them see Dr Zella Richer this pm.

## 2011-07-02 NOTE — Progress Notes (Signed)
Chief Complaint  Patient presents with  . Routine Post Op    pain and fever - to get cbc today    HPI Brandon Ray is a 76 y.o. male.   HPI  He underwent laparoscopic lysis of adhesions and repair of enterotomy on June 18, 2011 for small bowel obstruction. He was discharged June 26, 2011. He was doing well until yesterday when he developed right lower quadrant pain nausea and fever as high as 101.7. He called the office and was told to come in today to be evaluated. He had a CBC done today and a white blood cell count was elevated at 14,000. He's also had some constipation with this.  Past Medical History  Diagnosis Date  . Coronary artery disease   . Hyperlipidemia   . Seizure disorder   . Seizures   . Heart attack   . Fever   . Weakness   . Leg swelling     both legs  . Abdominal distension   . Abdominal pain   . Constipation   . Bruises easily     Past Surgical History  Procedure Date  . Cardiac catheterization 01/27/2003    NORMAL LEFT VENTRICULAR SIZE WITH MILD FOCAL LATERAL WALL HYPOKINESIA. EF 60%  . Coronary artery bypass graft 2004    LIMA GRAFT TO THE LAD, SAPHENOUS VEIN GRAFT TO THE DIAGONAL, SAPHENOUS VEIN GRAFT TO THE FIRST OM, SAPHENOUS VEIN GRAFT TO THE PDA  . Transurethral resection of prostate   . Hand surgery 2000    left  . Abdominal surgery 05/2011    bowel obstruction  . Ankle fracture surgery 1997    left    Family History  Problem Relation Age of Onset  . Asthma Mother   . Heart disease Mother   . Coronary artery disease Father   . Heart disease Father     Social History History  Substance Use Topics  . Smoking status: Former Smoker    Quit date: 07/01/1965  . Smokeless tobacco: Never Used  . Alcohol Use: No    Allergies  Allergen Reactions  . Amoxicillin Rash    Upper torso only.    Current Outpatient Prescriptions  Medication Sig Dispense Refill  . Cholecalciferol (VITAMIN D) 2000 UNITS tablet Take 2,000 Units by mouth  daily.        . furosemide (LASIX) 40 MG tablet Take 40 mg by mouth daily.        Marland Kitchen aspirin 81 MG tablet Take 81 mg by mouth daily. 2 DAILY       . atorvastatin (LIPITOR) 40 MG tablet Take 1 tablet (40 mg total) by mouth daily.  90 tablet  3  . calcium-vitamin D (OSCAL WITH D) 500-200 MG-UNIT per tablet Take 1 tablet by mouth daily.        . Coenzyme Q10 (COQ10) 400 MG CAPS Take by mouth daily.        . fish oil-omega-3 fatty acids 1000 MG capsule Take 1,200 mg by mouth daily.        . folic acid (FOLVITE) 130 MCG tablet Take 400 mcg by mouth daily.        . Multiple Vitamin (MULTIVITAMIN) tablet Take 1 tablet by mouth daily.        . Multiple Vitamins-Minerals (PRESERVISION AREDS PO) Take by mouth daily. 2 DAILY       . PHENobarbital (LUMINAL) 97.2 MG tablet Take 97.2 mg by mouth daily.        Marland Kitchen  phenytoin (DILANTIN) 100 MG ER capsule Take 200 mg by mouth daily.          Review of Systems Review of Systems  Constitutional: Positive for fever.  HENT: Negative for rhinorrhea and postnasal drip.   Respiratory: Negative for cough and shortness of breath.   Cardiovascular: Negative for chest pain.  Gastrointestinal:       As per HPI  Genitourinary: Negative for dysuria, frequency and difficulty urinating.    Blood pressure 112/58, pulse 98, temperature 97.2 F (36.2 C), temperature source Temporal, resp. rate 16, height $RemoveBe'6\' 1"'ZHmxLGgKM$  (1.854 m), weight 181 lb (82.101 kg).  Physical Exam Physical Exam  Constitutional: No distress.       Elderly weak-appearing male. Walks with a walker.  Eyes: Conjunctivae and EOM are normal.  Cardiovascular: Normal rate and regular rhythm.   No murmur heard. Pulmonary/Chest: Effort normal and breath sounds normal.  Abdominal: Soft. He exhibits no distension. There is no tenderness.       Multiple small well-healing scars without erythema.  Musculoskeletal: He exhibits edema.    Data Reviewed CBC with WBC 14k.  Assessment    Postoperative fever and  right lower quadrant pain with leukocytosis. This is suspicious for postop infection possibly intra-abdominal abscess.    Plan       He'll be sent to [facility] for a stat CT scan. If intra-abdominal infection is found he will be admitted, treated with IV abxs and possibly have a drainage procedure.    [name] J 07/02/2011, 6:13 PM

## 2011-07-03 ENCOUNTER — Inpatient Hospital Stay (HOSPITAL_COMMUNITY): Payer: Medicare Other

## 2011-07-03 LAB — COMPREHENSIVE METABOLIC PANEL
ALT: 36 U/L (ref 0–53)
AST: 39 U/L — ABNORMAL HIGH (ref 0–37)
Albumin: 2.1 g/dL — ABNORMAL LOW (ref 3.5–5.2)
Alkaline Phosphatase: 98 U/L (ref 39–117)
Calcium: 8.2 mg/dL — ABNORMAL LOW (ref 8.4–10.5)
Potassium: 4.7 mEq/L (ref 3.5–5.1)
Sodium: 130 mEq/L — ABNORMAL LOW (ref 135–145)
Total Protein: 6.3 g/dL (ref 6.0–8.3)

## 2011-07-03 LAB — CBC
HCT: 32.5 % — ABNORMAL LOW (ref 39.0–52.0)
Hemoglobin: 11.2 g/dL — ABNORMAL LOW (ref 13.0–17.0)
MCH: 32.2 pg (ref 26.0–34.0)
MCHC: 34.5 g/dL (ref 30.0–36.0)
MCV: 93.4 fL (ref 78.0–100.0)

## 2011-07-04 LAB — DIFFERENTIAL
Basophils Relative: 0 % (ref 0–1)
Eosinophils Absolute: 0 10*3/uL (ref 0.0–0.7)
Lymphocytes Relative: 6 % — ABNORMAL LOW (ref 12–46)
Lymphs Abs: 0.6 10*3/uL — ABNORMAL LOW (ref 0.7–4.0)
Monocytes Absolute: 0.8 10*3/uL (ref 0.1–1.0)
Neutro Abs: 9.3 10*3/uL — ABNORMAL HIGH (ref 1.7–7.7)

## 2011-07-04 LAB — CBC
HCT: 29.5 % — ABNORMAL LOW (ref 39.0–52.0)
Hemoglobin: 10.4 g/dL — ABNORMAL LOW (ref 13.0–17.0)
MCH: 32.7 pg (ref 26.0–34.0)
MCHC: 35.3 g/dL (ref 30.0–36.0)
MCV: 92.8 fL (ref 78.0–100.0)
Platelets: 290 10*3/uL (ref 150–400)
RBC: 3.18 MIL/uL — ABNORMAL LOW (ref 4.22–5.81)
RDW: 12.9 % (ref 11.5–15.5)
WBC: 10.7 10*3/uL — ABNORMAL HIGH (ref 4.0–10.5)

## 2011-07-05 ENCOUNTER — Inpatient Hospital Stay (HOSPITAL_COMMUNITY): Payer: Medicare Other

## 2011-07-06 LAB — CBC
Hemoglobin: 11.4 g/dL — ABNORMAL LOW (ref 13.0–17.0)
MCH: 31.8 pg (ref 26.0–34.0)
MCHC: 33.6 g/dL (ref 30.0–36.0)

## 2011-07-06 NOTE — H&P (Signed)
Brandon Ray, Brandon Ray NO.:  [id number]  MEDICAL RECORD NO.:  [id number]  LOCATION:  [facility]                         FACILITY:  [facility]  PHYSICIAN:  Gwenyth Ober, M.D.    DATE OF BIRTH:  22-Feb-1935  DATE OF ADMISSION:  06/16/2011 DATE OF DISCHARGE:                             HISTORY & PHYSICAL   CHIEF COMPLAINT:  The patient is a 76 year old male with a small bowel obstruction.  HISTORY OF PRESENT ILLNESS:  The patient was on a trip to the [place] over the weekend up into the beginning of this week.  He started getting sick on Monday 2 days ago approximately 48 hours with abdominal discomfort and nausea, vomiting.  He was even able to take a flight with the severe nausea, vomiting on Monday right back home where the symptoms continued.  He came in today with continued nausea, vomiting.  X-rays of abdomen showed air-fluid levels and the CT scan demonstrated a high-grade small bowel obstruction.  Surgicalconsultation was obtained.  PAST HISTORY:  Significant for; 1. Benign prostatic obstruction with a TURP in the 90s. 2. Coronary artery disease status post CABG in 2000 and history of     epilepsy.  PAST SURGERIES:  Had CABG times unknown number by Dr. Roxan Hockey in 2000 and a TURP in the 1990s.  FAMILY HISTORY:  Noncontributory.  He is a nonsmoker, nondrinker.  Takes no illicit drugs.  REVIEW OF SYSTEMS:  He has had no fevers or chills.  He has had soft stools and not obstipated.  MEDICATIONS: 1. Dilantin 2. Phenobarbital 3. Lipitor. 4. Baby aspirin. 5. Calcium carbonate. 6. Folic acid 7. Multivitamin. 8. Fish oil.  He claims no allergies.  However when asked specifically, he does say he had a rash with amoxicillin.  On examination, the patient does not appear to be in severe distress although he has got a chronically hacking cough possibly indicative of some mild aspiration.  He is normocephalic and atraumatic.  He  is anicteric.  Neck is supple.  Vital signs; temperature 97.6, pulse of 70, blood pressure 133/67, respirations of 20, O2 sats 99% on room air.  His neck is supple with no bruits.  There is no JVD.  Carotids are without bruits.  Lungs are clear to auscultation.  No rales, rhonchi or wheezing.  His cardiac exam, regular rhythm and rate with no murmurs. His abdomen is soft but mildly distended with no tenderness, mild discomfort diffusely, hypoactive bowel sounds with some were present. No tinkles or rushes.  Rectal exam is noncontributory.  No blood. Prostate was not palpated.  He has no exhibited decubitus ulcers.  LABORATORY STUDIES:  He has got normal electrolyte pattern, however, creatinine is 0.92, BUN is 29, hemoglobin of 15.0 and hematocrit at 48. White cell count 10.  Lactic acid level is 1.2.  CT scan of the abdomen and pelvis demonstrates multiple dilated small bowel loops mostly filled with fluid.  KUB supports a bowel obstruction.  It should be noted on examination, no hernias were noted.  IMPRESSION: 1. Small bowel obstruction, possible high-grade ileus secondary to     unknown etiology.  Possibilities include 2. Gallstone ileus, although this possibility  has no biliary air and     is not likely. 3. Adhesive small bowel obstruction.  No previous bowel abdominal     surgery, although he has had a prostatectomy, there was no     peritoneal penetration. 4. Internal hernia with obstruction.  This is a possibility too with a     transition zone noted on the right lower quadrant and CT scan that     maybe just a twist with a loop around the mesentery 5. High-grade ileus secondary to inflammatory gastroenteritis or     possibly Crohn disease.  PLAN:  To place an NG tube, hydrate the patient with a bolus of 500 mL of saline and increase his IV fluid up to 115 an hour of D5 half-normal saline plus 20k.  We will repeat his labs in the morning along with repeat of his abdominal  films.  An NG tube has been placed and quickly approximately 250 mL of foul-smelling light-brown tense fluid.  If his ileus and/or bowel obstruction does not resolve with simple NG-tube placement for short period of time, he will require exploration.  With his history of no prior abdominal surgery what appears to be a mechanical obstruction on CT and plain films, the likelihood is that he will require surgery for some type of mechanical process.  As mentioned before something differential also should be mentioned as a possible intussusception at the site of the knuckles or otherwise.     Gwenyth Ober, M.D.     JOW/MEDQ  D:  06/16/2011  T:  06/16/2011  Job:  [id number]  Electronically Signed by Judeth Horn M.D. on 07/06/2011 07:29:43 AM

## 2011-07-06 NOTE — Op Note (Signed)
  NAMEDEITRICK, FERRERI             ACCOUNT NO.:  [id number]  MEDICAL RECORD NO.:  [id number]  LOCATION:  5008                         FACILITY:  [facility]  PHYSICIAN:  Stark Klein, MD       DATE OF BIRTH:  1935/08/20  DATE OF PROCEDURE:  06/18/2011 DATE OF DISCHARGE:                              OPERATIVE REPORT   PREOPERATIVE DIAGNOSIS:  Small bowel obstruction.  POSTOPERATIVE DIAGNOSIS:  Small bowel obstruction.  PROCEDURES: 1. Laparoscopic lysis of adhesions. 2. Repair of small bowel enterotomy.  SURGEON:  Stark Klein, MD  ASSISTANT:  Henreitta Cea, PA  ANESTHESIA:  General and local.  FINDINGS:  Single band adhesion and small bowel enterotomy in weakened area from the band.  SPECIMEN:  None.  ESTIMATED BLOOD LOSS:  Minimal.  COMPLICATIONS:  None known.  DESCRIPTION OF PROCEDURE:  Mr. Housel was identified in the holding area and taken to the operating room where he was placed in supine on the operating room table.  General anesthesia was induced.  His left arm was tucked.  The infraumbilical skin was anesthetized with local anesthetic and a transverse incision was made with #11 blade.  The subcutaneous tissues were spread with a Claiborne Billings.  The midline fascia was elevated with 2 Kocher clamps.  This was incised with #11 blade.  The pursestring suture was placed around the fascial incision with a 0 Vicryl.  The Hasson was introduced in to the abdomen.  The trocar was held in place to the abdominal wall with the tails of the suture. Pneumoperitoneum was achieved to a pressure of 15 mmHg.  The patient was placed in to Trendelenburg position and rotated to the left.  A 5-mm trocar was placed in the suprapubic region and one in the left lower quadrant.  The dilated loops of bowel were seen in the pelvis as well as decompressed loops.  These were followed back and there was a single adhesive band overlying the bowel.  This was clipped.  The bowel was run.  In the weak  area of the bowel where the band was, a small enterotomy was created just from the gentle traction.  This was oversewn laparoscopically with 3-0 Vicryl sutures times 3.  The knot pusher was used. The area was tested and there was no residual succus coming out.  The abdomen was irrigated copiously.  A 4-quadrant inspection was performed that demonstrated no other gross pathology.  There was no mass found. The pneumoperitoneum was evacuated.  The pursestring was tied down at the umbilicus.  There was no residual palpable fascial defect.  The skin of all the incisions was closed with 4-0 Monocryl in subcuticular fashion.  The wounds were cleaned, dried, and dressed with Dermabond.  The patient was awakened from anesthesia and taken to the PACU in stable condition.  Needle, sponge, and instrument counts were correct.     Stark Klein, MD     FB/MEDQ  D:  06/18/2011  T:  06/18/2011  Job:  [id number]  Electronically Signed by Stark Klein MD on 07/06/2011 07:47:02 PM

## 2011-07-07 ENCOUNTER — Inpatient Hospital Stay (HOSPITAL_COMMUNITY): Payer: Medicare Other

## 2011-07-07 DIAGNOSIS — J90 Pleural effusion, not elsewhere classified: Secondary | ICD-10-CM

## 2011-07-08 ENCOUNTER — Inpatient Hospital Stay (HOSPITAL_COMMUNITY): Payer: Medicare Other

## 2011-07-08 LAB — CBC
HCT: 32.9 % — ABNORMAL LOW (ref 39.0–52.0)
Hemoglobin: 11.1 g/dL — ABNORMAL LOW (ref 13.0–17.0)
WBC: 9.7 10*3/uL (ref 4.0–10.5)

## 2011-07-08 LAB — BASIC METABOLIC PANEL
BUN: 6 mg/dL (ref 6–23)
Chloride: 95 mEq/L — ABNORMAL LOW (ref 96–112)
Glucose, Bld: 149 mg/dL — ABNORMAL HIGH (ref 70–99)
Potassium: 4 mEq/L (ref 3.5–5.1)

## 2011-07-08 MED ORDER — IOHEXOL 300 MG/ML  SOLN
80.0000 mL | Freq: Once | INTRAMUSCULAR | Status: AC | PRN
  Administered 2011-07-08: 80 mL via INTRAVENOUS

## 2011-07-09 ENCOUNTER — Inpatient Hospital Stay (HOSPITAL_COMMUNITY): Payer: Medicare Other

## 2011-07-09 DIAGNOSIS — J869 Pyothorax without fistula: Secondary | ICD-10-CM

## 2011-07-09 LAB — BASIC METABOLIC PANEL
CO2: 29 mEq/L (ref 19–32)
Calcium: 8 mg/dL — ABNORMAL LOW (ref 8.4–10.5)
GFR calc non Af Amer: >90 mL/min (ref >90)
Glucose, Bld: 174 mg/dL — ABNORMAL HIGH (ref 70–99)
Potassium: 3.6 mEq/L (ref 3.5–5.1)
Sodium: 131 mEq/L — ABNORMAL LOW (ref 135–145)

## 2011-07-09 LAB — BODY FLUID CULTURE: Gram Stain: NONE SEEN

## 2011-07-09 LAB — PREALBUMIN: Prealbumin: 5.5 mg/dL — ABNORMAL LOW (ref 17.0–34.0)

## 2011-07-09 LAB — CBC
Hemoglobin: 11 g/dL — ABNORMAL LOW (ref 13.0–17.0)
Platelets: 343 10*3/uL (ref 150–400)
RBC: 3.53 MIL/uL — ABNORMAL LOW (ref 4.22–5.81)

## 2011-07-09 NOTE — Discharge Summary (Signed)
[name], [name] NO.:  [id number]  MEDICAL RECORD NO.:  [id number]  LOCATION:  7628                         FACILITY:  [facility]  PHYSICIAN:  Brandon Ray, MDDATE OF BIRTH:  Aug 26, 1935  DATE OF ADMISSION:  06/16/2011 DATE OF DISCHARGE:  06/26/2011                              DISCHARGE SUMMARY   ADMITTING PHYSICIAN:  Brandon Ober, MD  DISCHARGING PHYSICIAN:  Brandon Gave, MD  CONSULTANTS:  None.  PROCEDURES:  Laparoscopic lysis of adhesions with a repair of small bowel enterotomy Dr. Stark Ray on June 18, 2011.  REASON FOR ADMISSION:  Brandon Ray is a 76 year old male who was recently on a trip to the [place] portion of [place].  Over the weekend after returning, he began having abdominal discomfort with nausea and vomiting for approximately 48 hours.  He presented to the emergency department due to continued complaints.  An x-Ray of the abdomen revealed air fluid levels.  A CT scan was obtained, which demonstrated a high-grade small bowel obstruction.  Please see admitting history for further details.  ADMITTING DIAGNOSES: 1. Small bowel obstruction. 2. Benign prostatic hypertrophy. 3. Coronary artery disease.  HOSPITAL COURSE:  The patient was admitted.  An NG tube was placed. Repeat abdominal x-rays were obtained, which revealed no changes in his obstructive pattern over the first two hospital days.  Because of continued high-grade bowel obstruction, the patient was taken to the operating room where he underwent diagnostic laparoscopy.  It was found that the patient had a single band adhesion in the mid small bowel, this was wise.  An enterotomy was made, but this was repaired laparoscopically.  The patient tolerated the procedure well. Postoperatively, the patient had an ileus and his NG tube remained in place.  However, by postoperative day #2, the patient did have some positive flatus.  Therefore, his NG tube was  discontinued and he was started on clear liquids.  On postoperative day #3, the patient was decreased back to sips of clears only as he became more distended.  He was still passing some flatus.  On postoperative day #4 due to worsening abdominal distention, he was made n.p.o. except for ice chips.  This continued for several more days.  By postoperative #6, his ileus was resolving.  His abdomen was much softer and nondistended.  His diet was then advanced as tolerated.  By postoperative day #7, the patient was able to tolerate a regular diet.  His abdomen was soft, nontender, and nondistended with active bowel sounds.  The patient did have some urinary retention issues.  Initially, his Foley was discontinued, but had to be replaced.  Ultimately on postoperative #6, his Foley was discontinued again and he was able to void on his own.  By postoperative day #7, this was no longer an issue.  DISCHARGE DIAGNOSES: 1. Small bowel obstruction, status post laparoscopic lysis of     adhesions and repair of small bowel enterotomy. 2. Urinary retention, improved and resolved. 3. Benign prostatic hypertrophy. 4. Coronary artery disease.  DISCHARGE MEDICATIONS:  The patient may resume all home medications including aspirin, calcium, and vitamin D, CoQ10, Dilantin, fish oil, folic acid, Lipitor, multivitamin, phenobarbital, PreserVision, and vitamin  D.  He was given a new prescription for Vicodin 5/325 one to two p.o. q.4 h. p.r.n. pain.  DISCHARGE INSTRUCTIONS:  He may increase his activity slowly and walk up steps.  He may shower; however, he is not to bathe.  He is not to do any heavy lifting for the next 1-2 weeks over 15 pounds.  He has no dietary restrictions.  He is to follow up with Dr. Stark Ray in our office in 2-3 weeks.  He is to call for fever worse over 101.5 or worsening abdominal pain.     Brandon Cea, PA   ______________________________ Brandon Gave,  MD    KEO/MEDQ  D:  06/26/2011  T:  06/26/2011  Job:  [id number]  cc:   Brandon Klein, MD  Electronically Signed by Brandon Danker PA on 07/08/2011 05:15:11 PM Electronically Signed by Brandon Bookbinder MD on 07/09/2011 05:25:52 PM

## 2011-07-10 ENCOUNTER — Inpatient Hospital Stay (HOSPITAL_COMMUNITY): Payer: Medicare Other

## 2011-07-10 DIAGNOSIS — K565 Intestinal adhesions [bands], unspecified as to partial versus complete obstruction: Secondary | ICD-10-CM

## 2011-07-11 ENCOUNTER — Inpatient Hospital Stay (HOSPITAL_COMMUNITY): Payer: Medicare Other

## 2011-07-11 DIAGNOSIS — K565 Intestinal adhesions [bands], unspecified as to partial versus complete obstruction: Secondary | ICD-10-CM

## 2011-07-11 DIAGNOSIS — J869 Pyothorax without fistula: Secondary | ICD-10-CM

## 2011-07-11 LAB — GLUCOSE, CAPILLARY: Glucose-Capillary: 145 mg/dL — ABNORMAL HIGH (ref 70–99)

## 2011-07-12 ENCOUNTER — Inpatient Hospital Stay (HOSPITAL_COMMUNITY): Payer: Medicare Other

## 2011-07-12 DIAGNOSIS — J90 Pleural effusion, not elsewhere classified: Secondary | ICD-10-CM

## 2011-07-13 ENCOUNTER — Inpatient Hospital Stay (HOSPITAL_COMMUNITY): Payer: Medicare Other

## 2011-07-13 DIAGNOSIS — J90 Pleural effusion, not elsewhere classified: Secondary | ICD-10-CM

## 2011-07-13 MED ORDER — IOHEXOL 300 MG/ML  SOLN
80.0000 mL | Freq: Once | INTRAMUSCULAR | Status: AC | PRN
  Administered 2011-07-13: 80 mL via INTRAVENOUS

## 2011-07-14 ENCOUNTER — Inpatient Hospital Stay (HOSPITAL_COMMUNITY): Payer: Medicare Other

## 2011-07-15 DIAGNOSIS — J90 Pleural effusion, not elsewhere classified: Secondary | ICD-10-CM

## 2011-07-15 LAB — PROTHROMBIN GENE MUTATION

## 2011-07-16 ENCOUNTER — Telehealth: Payer: Self-pay | Admitting: *Deleted

## 2011-07-16 DIAGNOSIS — J90 Pleural effusion, not elsewhere classified: Secondary | ICD-10-CM

## 2011-07-16 NOTE — Telephone Encounter (Signed)
Message copied by Inge Rise on Fri Jul 16, 2011  9:09 AM ------      Message from: Chesley Mires      Created: Thu Jul 15, 2011  9:19 AM       Please schedule [name] for HFU with CXR for next week.  He should be d/c home later today, so can call to schedule appointment tomorrow.  Okay to over book if needed.  He will need 15 min slot.  Thanks.

## 2011-07-16 NOTE — Telephone Encounter (Signed)
I spoke with pt and she made pt apt for 1/1 at 9:00. Wife is aware to have pt arrive 15 min to have cxr done first. Nothing further was needed

## 2011-07-20 ENCOUNTER — Encounter (INDEPENDENT_AMBULATORY_CARE_PROVIDER_SITE_OTHER): Payer: Medicare Other | Admitting: General Surgery

## 2011-07-22 ENCOUNTER — Ambulatory Visit (INDEPENDENT_AMBULATORY_CARE_PROVIDER_SITE_OTHER): Payer: Medicare Other | Admitting: Pulmonary Disease

## 2011-07-22 ENCOUNTER — Ambulatory Visit (INDEPENDENT_AMBULATORY_CARE_PROVIDER_SITE_OTHER)
Admission: RE | Admit: 2011-07-22 | Discharge: 2011-07-22 | Disposition: A | Discharge status: Home / Self Care | Payer: Medicare Other | Source: Ambulatory Visit | Attending: Pulmonary Disease | Admitting: Pulmonary Disease

## 2011-07-22 ENCOUNTER — Encounter: Payer: Self-pay | Admitting: Pulmonary Disease

## 2011-07-22 DIAGNOSIS — J90 Pleural effusion, not elsewhere classified: Secondary | ICD-10-CM

## 2011-07-22 DIAGNOSIS — R000 Tachycardia, unspecified: Secondary | ICD-10-CM

## 2011-07-22 NOTE — Progress Notes (Signed)
Chief Complaint  Patient presents with  . HFU w/ CXR    Pt states he is feeling better everyday. Pt states he gets SOB w/ exercise. Pt c/o cough every other day w/ clear phlem.    History of Present Illness:  CC: Faeya Byerly, Peter [place]  76 yo male with pleural effusion.  He has been feeling better.  He denies chest pain, fever, cough, sputum, or wheeze.  He gets winded with PT, but does okay at rest.  He has noticed his heart rate goes up with PT and sometimes at rest.  Past Medical History  Diagnosis Date  . Coronary artery disease   . Hyperlipidemia   . Seizure disorder   . Seizures   . Heart attack   . Fever   . Weakness   . Leg swelling     both legs  . Abdominal distension   . Abdominal pain   . Constipation   . Bruises easily     Past Surgical History  Procedure Date  . Cardiac catheterization 01/27/2003    NORMAL LEFT VENTRICULAR SIZE WITH MILD FOCAL LATERAL WALL HYPOKINESIA. EF 60%  . Coronary artery bypass graft 2004    LIMA GRAFT TO THE LAD, SAPHENOUS VEIN GRAFT TO THE DIAGONAL, SAPHENOUS VEIN GRAFT TO THE FIRST OM, SAPHENOUS VEIN GRAFT TO THE PDA  . Transurethral resection of prostate   . Hand surgery 2000    left  . Abdominal surgery 05/2011    bowel obstruction  . Ankle fracture surgery 1997    left    Current Outpatient Prescriptions on File Prior to Visit  Medication Sig Dispense Refill  . aspirin 81 MG tablet Take 81 mg by mouth daily.       Marland Kitchen atorvastatin (LIPITOR) 40 MG tablet Take 1 tablet (40 mg total) by mouth daily.  90 tablet  3  . calcium-vitamin D (OSCAL WITH D) 500-200 MG-UNIT per tablet Take 1 tablet by mouth daily.        . Cholecalciferol (VITAMIN D) 2000 UNITS tablet Take 2,000 Units by mouth daily.        . Coenzyme Q10 (COQ10) 400 MG CAPS Take by mouth daily.        . fish oil-omega-3 fatty acids 1000 MG capsule Take 1,200 mg by mouth daily.        . folic acid (FOLVITE) 517 MCG tablet Take 400 mcg by mouth daily.        .  furosemide (LASIX) 40 MG tablet Take 40 mg by mouth daily.        . Multiple Vitamin (MULTIVITAMIN) tablet Take 1 tablet by mouth daily.        . Multiple Vitamins-Minerals (PRESERVISION AREDS PO) Take by mouth daily. 2 DAILY       . PHENobarbital (LUMINAL) 97.2 MG tablet Take 97.2 mg by mouth daily.        . phenytoin (DILANTIN) 100 MG ER capsule Take 200 mg by mouth daily.          Allergies  Allergen Reactions  . Amoxicillin Rash    Upper torso only.    Physical Exam:  Blood pressure 120/72, pulse 105, temperature 97.1 F (36.2 C), temperature source Oral, height $RemoveBefo'6\' 1"'aUgbRHhdgaq$  (1.854 m), weight 175 lb 9.6 oz (79.652 kg), SpO2 95.00%.  General - Thin HEENT - no sinus tenderness, no oral exudate, no LAN Cardiac - s1s2 regular Chest - no wheeze/rales, decreased BS Rt base, well healed chest tube site Abdomen -  soft, nontender Extremities - no edema Skin - no rashes Neurologic - normal strength Psychiatric - normal mood, behavior  CHEST - 2 VIEW 07/22/11: Comparison: 07/14/2011, 07/13/2011  Findings: Continued improvement in the small right pleural  effusion. There is residual right lower lobe atelectasis and  pleural thickening. Previous coronary bypass changes noted.  Normal heart size and vascularity. Calcified granuloma noted in  the lingula. Left lung remains clear. No pneumothorax. Trachea  is midline. Degenerative changes of the spine with a mild  scoliosis.  IMPRESSION:  Continued improvement in the right effusion and right lower lobe  atelectasis.  No pneumothorax  Stable chronic findings  Assessment/Plan:  Pleural effusion on right Continues to have clinical and radiographic improvement.  Will continue observation and repeat chest xray in one month.  Tachycardia He has noticed increased heart rate with PT.  Advised him to discuss with primary care and cardiology.     Outpatient Encounter Prescriptions as of 07/22/2011  Medication Sig Dispense Refill  . aspirin  81 MG tablet Take 81 mg by mouth daily.       Marland Kitchen atorvastatin (LIPITOR) 40 MG tablet Take 1 tablet (40 mg total) by mouth daily.  90 tablet  3  . calcium-vitamin D (OSCAL WITH D) 500-200 MG-UNIT per tablet Take 1 tablet by mouth daily.        . Cholecalciferol (VITAMIN D) 2000 UNITS tablet Take 2,000 Units by mouth daily.        . Coenzyme Q10 (COQ10) 400 MG CAPS Take by mouth daily.        . fish oil-omega-3 fatty acids 1000 MG capsule Take 1,200 mg by mouth daily.        . folic acid (FOLVITE) 886 MCG tablet Take 400 mcg by mouth daily.        . furosemide (LASIX) 40 MG tablet Take 40 mg by mouth daily.        . Multiple Vitamin (MULTIVITAMIN) tablet Take 1 tablet by mouth daily.        . Multiple Vitamins-Minerals (PRESERVISION AREDS PO) Take by mouth daily. 2 DAILY       . pantoprazole (PROTONIX) 40 MG tablet Once a day      . PHENobarbital (LUMINAL) 97.2 MG tablet Take 97.2 mg by mouth daily.        . phenytoin (DILANTIN) 100 MG ER capsule Take 200 mg by mouth daily.          Akeema Broder 07/22/2011, 9:34 AM

## 2011-07-22 NOTE — Assessment & Plan Note (Signed)
Continues to have clinical and radiographic improvement.  Will continue observation and repeat chest xray in one month.

## 2011-07-22 NOTE — Patient Instructions (Signed)
Follow up in one month with a chest xray

## 2011-07-22 NOTE — Discharge Summary (Signed)
Brandon Ray, Brandon Ray NO.:  [id number]  MEDICAL RECORD NO.:  [id number]  LOCATION:  2725                         FACILITY:  [facility]  PHYSICIAN:  Gwenyth Ober, M.D.    DATE OF BIRTH:  Jan 18, 1935  DATE OF ADMISSION:  07/02/2011 DATE OF DISCHARGE:  07/15/2011                              DISCHARGE SUMMARY   HISTORY OF PRESENT ILLNESS:  Brandon Ray is a 76 year old gentleman who underwent laparoscopic lysis of adhesions and repair a small enterotomy on June 18, 2011 for small bowel obstruction.  This was performed by Dr. Barry Dienes.  He was subsequently discharged on June 26, 2011 and had been doing well until he developed right lower quadrant pain, nausea, and fever up to 101.  He called the office and was asked to come in to be evaluated.  He saw Dr. Zella Richer in the office and had an elevated white blood cell count.  Decision was made by Dr. Zella Richer to admit the patient, as there was significant suspicion for intra- abdominal abscess.  Again, Dr. Zella Richer decided to admit the patient on July 02, 2011 for IV antibiotics and further workup including CT scan.  [place] COURSE:  The patient was admitted on July 02, 2011, IV antibiotics were initiated, and the patient was sent to Radiology for a CT scan.  The CT scan showed evidence of 2 small pelvic fluid collections but they were not amenable to Interventional Radiology drainage.  Decision was made to continue IV antibiotics and inpatient management.  Clinically, the patient continued to do well.  Incidentally noted on his CT was a small right-sided pleural effusion.  He denied any shortness of breath, but had progressively deteriorating numbers on incentive spirometer.  Chest x-ray showed increased evidence of the pleural effusion.  A thoracentesis was performed on July 05, 2011 yielding about 350 mL of brownish serous fluid, however, the culture ended being negative on that.  The  patient again persisted with improvement of his abdominal complaints and pain.  He no longer had any fever and no longer had any leukocytosis.  The patient's treatment of his pleural effusion consisted of aggressive pulmonary toilet.  However, serial chest x-rays showed continued evidence of effusion and atelectasis.  A followup CT for the abdominal fluid collections on July 08, 2011 showed again increased right base pleural effusion with consolidation.  However, in the abdomen, the fluid collections in the pelvis were significantly smaller and no new fluid collections were seen and no other inflammatory changes were noted.  Antibiotics again were continued, however, based on the CT scan findings, Pulmonary consult was obtained.  They saw the patient on July 09, 2011 and recommended repeat drainage of the pleural effusion, however, this times recommending a pigtail catheter be left in place.  Therefore, Intervention Radiology was consulted on the same day as well for drainage and subsequently proceeded with a CT-guided right chest tube placement on July 10, 2011.  This yielded again serous fluid and the culture again grew out negative.  This was hooked up to a Pleur-Evac suction and monitored.  Symptomatically, the patient essentially had no symptoms regarding shortness of breath or chest discomfort.  His incentive spirometer  numbers improved.  The patient did have some residual gastroesophageal reflux-type symptoms, but was gradually tolerating a regular diet and having good bowel function.  A couple doses of MiraLax allowed for a large bowel movement and after that point bowel movements were fairly regular and the MiraLax was discontinued. Serial x-rays after the chest tube was placed showed improvement in the effusion as well as improved airspace.  The Pulmonary Team had reviewed his findings with the CT Surgery Team and apparently no further recommendations were encountered.   They did not feel he needed a large- bore chest tube, and felt that the chest tube could actually be removed. A CT scan was performed showing significant improvement today in that right pleural effusion and therefore the chest tube was discontinued.  A followup chest x-ray showed no evidence of pneumothorax.  By July 15, 2011, the patient's overall clinical picture looks significantly improved.  He is not having any shortness of breath.  He has not had recurrence of discomfort on the right side since the chest tube was removed.  His abdomen was soft and nontender.  His bowel movements have been fairly regular.  His incisions from his surgery are now well-healed and showed no evidence of complication.  DISCHARGE DIAGNOSES: 1. Postoperative pelvic fluid collections from previous laparoscopic     lysis of adhesions - resolving. 2. Right-sided pleural effusions secondary to intra-abdominal     inflammatory process - resolving after chest tube placement. 3. Multiple medical problems, stable including hyperlipidemia and     coronary artery disease,  hypertension, and seizure disorder.  DISCHARGE MEDICATIONS: 1. Nystatin cream applications twice daily. 2. Percocet 5/325 one tablet q.4-6 hours p.r.n. 3. Protonix 40 mg daily. 4. Zofran 4 mg every 6 hours p.r.n. 5. Aspirin 81 mg daily. 6. Calcium once daily. 7. Enzyme CoQ10 daily. 8. Dilantin 100 mg 2 tablets at bedtime. 9. Fish oil daily. 10.Folic acid daily. 11.Lipitor 20 mg daily. 12.Multivitamin once daily. 13.Phenobarbital 97.2 mg once daily at bedtime. 14.Vitamin D once daily.  DISCHARGE INSTRUCTIONS:  The patient is given discharge instructions regarding abdominal surgery.  He is to encourage Ensure or Boost to supplement his oral intake.  He is to continue activities and exercises as directed.  He will follow up with [facility] Pulmonology regarding his pleural effusion, and will follow up with Dr. Barry Dienes regarding  his abdominal issue.     Ascencion Dike, PA-C   ______________________________ Gwenyth Ober, M.D.    KB/MEDQ  D:  07/21/2011  T:  07/21/2011  Job:  [id number]  Electronically Signed by Ascencion Dike  on 07/22/2011 01:32:05 PM Electronically Signed by Judeth Horn M.D. on 07/22/2011 06:53:09 PM

## 2011-07-22 NOTE — Assessment & Plan Note (Signed)
He has noticed increased heart rate with PT.  Advised him to discuss with primary care and cardiology.

## 2011-07-26 ENCOUNTER — Ambulatory Visit (INDEPENDENT_AMBULATORY_CARE_PROVIDER_SITE_OTHER): Payer: Medicare Other | Admitting: General Surgery

## 2011-07-26 ENCOUNTER — Encounter (INDEPENDENT_AMBULATORY_CARE_PROVIDER_SITE_OTHER): Payer: Self-pay | Admitting: General Surgery

## 2011-07-26 VITALS — BP 136/68 | HR 100 | Temp 97.4°F | Resp 20 | Ht 73.0 in | Wt 178.4 lb

## 2011-07-26 DIAGNOSIS — K56609 Unspecified intestinal obstruction, unspecified as to partial versus complete obstruction: Secondary | ICD-10-CM

## 2011-07-26 NOTE — Progress Notes (Signed)
HISTORY: Pt is doing better since discharge.  He had to be readmitted after his initial hospitalization for abdominal abscess and pleural effusion.  He had to have the pleural effusion drained times 2.  He also had some tachycardia.  He is improving with PT.      EXAM: Head: Normocephalic and atraumatic.  Eyes:  Conjunctivae are normal. Pupils are equal, round, and reactive to light. No scleral icterus.  Neck:  Normal range of motion. Neck supple. No tracheal deviation present. No thyromegaly present.  Resp: No respiratory distress, normal effort. Abd:  Abdomen is soft, non tender, slightly distended. No masses are palpable.  There is no rebound and no guarding. Incisions are well healed.   Neurological: Alert and oriented to person, place, and time. Coordination normal.  Skin: Skin is warm and dry. No rash noted. No diaphoretic. No erythema. No pallor.  Psychiatric: Normal mood and affect. Normal behavior. Judgment and thought content normal.    ASSESSMENT AND PLAN:   Small bowel obstruction, s/p lap LOA, c/b post op abscess Doing well. Increase nutritional intake, especially protein.   Lifting restrictions are over, but pt will still feel debilitated for another 8 weeks, likely. If tachycardia continues, pt may need to see cardiologist.   Increase stool softener to BID and increase water intake for constipation. Follow up with me in 4-6 weeks.      Milus Height, MD Surgical Oncology, [facility]  [name], MD Rex Kras, [employer], MD

## 2011-07-26 NOTE — Assessment & Plan Note (Signed)
Doing well. Increase nutritional intake, especially protein.   Lifting restrictions are over, but pt will still feel debilitated for another 8 weeks, likely. If tachycardia continues, pt may need to see cardiologist.   Increase stool softener to BID and increase water intake for constipation. Follow up with me in 4-6 weeks.

## 2011-07-29 ENCOUNTER — Telehealth (INDEPENDENT_AMBULATORY_CARE_PROVIDER_SITE_OTHER): Payer: Self-pay | Admitting: General Surgery

## 2011-07-29 NOTE — Telephone Encounter (Signed)
Discharging pt from home health care today, and is recommending physical therapy,next step, for mobility and flexibility and the pt would like to go to [facility] @ [employer].  If you have any questions please call.

## 2011-08-03 ENCOUNTER — Ambulatory Visit: Payer: Medicare Other | Attending: General Surgery | Admitting: Physical Therapy

## 2011-08-03 ENCOUNTER — Telehealth (INDEPENDENT_AMBULATORY_CARE_PROVIDER_SITE_OTHER): Payer: Self-pay

## 2011-08-03 DIAGNOSIS — M6281 Muscle weakness (generalized): Secondary | ICD-10-CM | POA: Insufficient documentation

## 2011-08-03 DIAGNOSIS — R5381 Other malaise: Secondary | ICD-10-CM | POA: Insufficient documentation

## 2011-08-03 DIAGNOSIS — IMO0001 Reserved for inherently not codable concepts without codable children: Secondary | ICD-10-CM | POA: Insufficient documentation

## 2011-08-03 DIAGNOSIS — R269 Unspecified abnormalities of gait and mobility: Secondary | ICD-10-CM | POA: Insufficient documentation

## 2011-08-03 NOTE — Telephone Encounter (Signed)
Faxed order for pt's PT to [facility] at [facility].

## 2011-08-05 ENCOUNTER — Ambulatory Visit: Payer: Medicare Other | Admitting: Physical Therapy

## 2011-08-09 ENCOUNTER — Other Ambulatory Visit (INDEPENDENT_AMBULATORY_CARE_PROVIDER_SITE_OTHER): Payer: Self-pay | Admitting: General Surgery

## 2011-08-09 DIAGNOSIS — J90 Pleural effusion, not elsewhere classified: Secondary | ICD-10-CM

## 2011-08-09 DIAGNOSIS — IMO0002 Reserved for concepts with insufficient information to code with codable children: Secondary | ICD-10-CM

## 2011-08-10 ENCOUNTER — Encounter: Payer: Medicare Other | Admitting: Physical Therapy

## 2011-08-11 ENCOUNTER — Ambulatory Visit: Payer: Medicare Other | Admitting: Physical Therapy

## 2011-08-17 ENCOUNTER — Ambulatory Visit: Payer: Medicare Other | Admitting: Physical Therapy

## 2011-08-19 ENCOUNTER — Ambulatory Visit: Payer: Medicare Other | Admitting: Physical Therapy

## 2011-08-20 ENCOUNTER — Ambulatory Visit: Payer: Medicare Other | Admitting: Physical Therapy

## 2011-08-23 ENCOUNTER — Encounter: Payer: Self-pay | Admitting: Pulmonary Disease

## 2011-08-23 ENCOUNTER — Ambulatory Visit (INDEPENDENT_AMBULATORY_CARE_PROVIDER_SITE_OTHER)
Admission: RE | Admit: 2011-08-23 | Discharge: 2011-08-23 | Disposition: A | Discharge status: Home / Self Care | Payer: Medicare Other | Source: Ambulatory Visit | Attending: Pulmonary Disease | Admitting: Pulmonary Disease

## 2011-08-23 ENCOUNTER — Other Ambulatory Visit: Payer: Self-pay | Admitting: Family Medicine

## 2011-08-23 ENCOUNTER — Ambulatory Visit (INDEPENDENT_AMBULATORY_CARE_PROVIDER_SITE_OTHER): Payer: Medicare Other | Admitting: Pulmonary Disease

## 2011-08-23 ENCOUNTER — Other Ambulatory Visit: Payer: Self-pay | Admitting: Pulmonary Disease

## 2011-08-23 DIAGNOSIS — J90 Pleural effusion, not elsewhere classified: Secondary | ICD-10-CM

## 2011-08-23 DIAGNOSIS — M21379 Foot drop, unspecified foot: Secondary | ICD-10-CM

## 2011-08-23 NOTE — Assessment & Plan Note (Signed)
Resolved.  He does not need additional follow up with pulmonary medicine for this.  He can return to pulmonary as needed.

## 2011-08-23 NOTE — Progress Notes (Signed)
Chief Complaint  Patient presents with  . HFU    w/ CXR. Pt states he is feeling fine and has no compliants today. Pt states he is doing outpatient pt   CC: Faeya Byerly, Peter Martinique  History of Present Illness: Korin Hartwell is a 76 y.o. male with pleural effusion.  He has been feeling better.  He denies cough, wheeze, fever, chest pain.  He does not feel like his breathing limits his activity.   Past Medical History  Diagnosis Date  . Coronary artery disease   . Hyperlipidemia   . Seizure disorder   . Seizures   . Heart attack   . Fever   . Weakness   . Leg swelling     both legs  . Abdominal distension   . Abdominal pain   . Constipation   . Bruises easily   . Pleural effusion   . Small bowel obstruction     Past Surgical History  Procedure Date  . Cardiac catheterization 01/27/2003    NORMAL LEFT VENTRICULAR SIZE WITH MILD FOCAL LATERAL WALL HYPOKINESIA. EF 60%  . Coronary artery bypass graft 2004    LIMA GRAFT TO THE LAD, SAPHENOUS VEIN GRAFT TO THE DIAGONAL, SAPHENOUS VEIN GRAFT TO THE FIRST OM, SAPHENOUS VEIN GRAFT TO THE PDA  . Transurethral resection of prostate   . Hand surgery 2000    left  . Abdominal surgery 05/2011    bowel obstruction  . Ankle fracture surgery 1997    left    Current Outpatient Prescriptions on File Prior to Visit  Medication Sig Dispense Refill  . aspirin 81 MG tablet Take 81 mg by mouth daily.       . calcium-vitamin D (OSCAL WITH D) 500-200 MG-UNIT per tablet Take 1 tablet by mouth daily.        . Cholecalciferol (VITAMIN D) 2000 UNITS tablet Take 2,000 Units by mouth daily.        . fish oil-omega-3 fatty acids 1000 MG capsule Take 1,200 mg by mouth daily.        . folic acid (FOLVITE) 833 MCG tablet Take 800 mcg by mouth daily.       . Multiple Vitamin (MULTIVITAMIN) tablet Take 1 tablet by mouth daily.        . Multiple Vitamins-Minerals (PRESERVISION AREDS PO) Take by mouth daily. 2 DAILY       . PHENobarbital (LUMINAL)  97.2 MG tablet Take 97.2 mg by mouth daily.        . phenytoin (DILANTIN) 100 MG ER capsule Take 200 mg by mouth daily.          Allergies  Allergen Reactions  . Amoxicillin Rash    Upper torso only.    Physical Exam:  Blood pressure 104/66, pulse 89, temperature 97.4 F (36.3 C), temperature source Oral, height $RemoveBefo'6\' 1"'uuaFHrzyAag$  (1.854 m), weight 183 lb 3.2 oz (83.099 kg), SpO2 98.00%. Body mass index is 24.17 kg/(m^2).  General - Thin  HEENT - no sinus tenderness, no oral exudate, no LAN  Cardiac - s1s2 regular  Chest - no wheeze/rales  Abdomen - soft, nontender  Extremities - no edema  Skin - no rashes  Neurologic - normal strength  Psychiatric - normal mood, behavior  Dg Chest 2 View  08/23/2011  *RADIOLOGY REPORT*  Clinical Data: Follow up pleural effusion  CHEST - 2 VIEW  Comparison: 07/22/2011  Findings: Cardiomediastinal silhouette is stable.  Status post CABG again noted.  Chronic blunting of the right  costophrenic angle is stable.  No convincing pulmonary edema or pleural effusion.  Stable mild degenerative changes thoracic spine.  Trace fluid is noted in the right fissure.  Calcified granuloma in the lingula is stable.   IMPRESSION: Chronic blunting of the right costophrenic angle is stable.  No convincing pulmonary edema or pleural effusion.  Stable mild degenerative changes thoracic spine.  Trace fluid is noted in the right fissure.   Original Report Authenticated By: Lahoma Crocker, M.D.   Assessment/Plan:  Pleural effusion on right Resolved.  He does not need additional follow up with pulmonary medicine for this.  He can return to pulmonary as needed.     Outpatient Encounter Prescriptions as of 08/23/2011  Medication Sig Dispense Refill  . aspirin 81 MG tablet Take 81 mg by mouth daily.       Marland Kitchen atorvastatin (LIPITOR) 20 MG tablet Take 20 mg by mouth daily.        . calcium-vitamin D (OSCAL WITH D) 500-200 MG-UNIT per tablet Take 1 tablet by mouth daily.        .  Cholecalciferol (VITAMIN D) 2000 UNITS tablet Take 2,000 Units by mouth daily.        . fish oil-omega-3 fatty acids 1000 MG capsule Take 1,200 mg by mouth daily.        . folic acid (FOLVITE) 749 MCG tablet Take 800 mcg by mouth daily.       . Multiple Vitamin (MULTIVITAMIN) tablet Take 1 tablet by mouth daily.        . Multiple Vitamins-Minerals (PRESERVISION AREDS PO) Take by mouth daily. 2 DAILY       . PHENobarbital (LUMINAL) 97.2 MG tablet Take 97.2 mg by mouth daily.        . phenytoin (DILANTIN) 100 MG ER capsule Take 200 mg by mouth daily.        Marland Kitchen DISCONTD: atorvastatin (LIPITOR) 40 MG tablet Take 1 tablet (40 mg total) by mouth daily.  90 tablet  3  . DISCONTD: Coenzyme Q10 (COQ10) 400 MG CAPS Take by mouth daily.        Marland Kitchen DISCONTD: furosemide (LASIX) 40 MG tablet Take 40 mg by mouth daily.        Marland Kitchen DISCONTD: pantoprazole (PROTONIX) 40 MG tablet Once a day        Neysha Criado Pager:  [phone] 08/23/2011, 9:19 AM

## 2011-08-23 NOTE — Patient Instructions (Signed)
Follow up with pulmonary as needed

## 2011-08-24 ENCOUNTER — Ambulatory Visit: Payer: Medicare Other | Attending: General Surgery | Admitting: Physical Therapy

## 2011-08-24 DIAGNOSIS — M6281 Muscle weakness (generalized): Secondary | ICD-10-CM | POA: Insufficient documentation

## 2011-08-24 DIAGNOSIS — R269 Unspecified abnormalities of gait and mobility: Secondary | ICD-10-CM | POA: Insufficient documentation

## 2011-08-24 DIAGNOSIS — R5381 Other malaise: Secondary | ICD-10-CM | POA: Insufficient documentation

## 2011-08-24 DIAGNOSIS — IMO0001 Reserved for inherently not codable concepts without codable children: Secondary | ICD-10-CM | POA: Insufficient documentation

## 2011-08-26 ENCOUNTER — Ambulatory Visit: Payer: Medicare Other | Admitting: Physical Therapy

## 2011-08-27 ENCOUNTER — Ambulatory Visit: Payer: Medicare Other | Admitting: Physical Therapy

## 2011-08-27 ENCOUNTER — Ambulatory Visit
Admission: RE | Admit: 2011-08-27 | Discharge: 2011-08-27 | Disposition: A | Discharge status: Home / Self Care | Payer: Medicare Other | Source: Ambulatory Visit | Attending: Family Medicine | Admitting: Family Medicine

## 2011-08-27 DIAGNOSIS — M21379 Foot drop, unspecified foot: Secondary | ICD-10-CM

## 2011-08-31 ENCOUNTER — Ambulatory Visit: Payer: Medicare Other | Admitting: Physical Therapy

## 2011-09-02 ENCOUNTER — Encounter: Payer: Medicare Other | Admitting: Physical Therapy

## 2011-09-03 ENCOUNTER — Encounter (INDEPENDENT_AMBULATORY_CARE_PROVIDER_SITE_OTHER): Payer: Self-pay | Admitting: General Surgery

## 2011-09-03 ENCOUNTER — Ambulatory Visit (INDEPENDENT_AMBULATORY_CARE_PROVIDER_SITE_OTHER): Payer: Medicare Other | Admitting: General Surgery

## 2011-09-03 ENCOUNTER — Encounter: Payer: Medicare Other | Admitting: Physical Therapy

## 2011-09-03 VITALS — BP 119/62 | HR 84 | Temp 97.1°F | Resp 16 | Ht 73.0 in | Wt 186.8 lb

## 2011-09-03 DIAGNOSIS — K56609 Unspecified intestinal obstruction, unspecified as to partial versus complete obstruction: Secondary | ICD-10-CM

## 2011-09-03 MED ORDER — POLYETHYLENE GLYCOL 3350 17 GM/SCOOP PO POWD: 8.5000 g | Freq: Every day | ORAL | Status: AC

## 2011-09-03 NOTE — Progress Notes (Signed)
HISTORY: Pt doing much better.  He is getting around with much less difficulty.  His appetite has improved.  He is still having some constipation.  He denies pain.     EXAM: Head: Normocephalic and atraumatic.  Eyes:  Conjunctivae are normal. Pupils are equal, round, and reactive to light. No scleral icterus.  Resp: No respiratory distress, normal effort. Abd:  Abdomen is soft, non distended and non tender. No masses are palpable.  There is no rebound and no guarding.  Neurological: Alert and oriented to person, place, and time. Coordination normal.  Skin: Skin is warm and dry. No rash noted. No diaphoretic. No erythema. No pallor.  Psychiatric: Normal mood and affect. Normal behavior. Judgment and thought content normal.    ASSESSMENT AND PLAN:   Small bowel obstruction, s/p lap LOA, c/b post op abscess Doing better Released from PT. Follow up with me prn. Control constipation with stool softeners, laxatives, and drinking lots of water.      Milus Height, MD Surgical Oncology, [facility]  [name], MD Rex Kras, [employer], MD

## 2011-09-03 NOTE — Assessment & Plan Note (Addendum)
Doing better Released from PT. Follow up with me prn. Control constipation with stool softeners, laxatives, and drinking lots of water.

## 2011-09-03 NOTE — Patient Instructions (Signed)
Take Miralax as a laxative daily if needed for constipation  Drink a lot of water!!!

## 2011-09-07 ENCOUNTER — Encounter: Payer: Medicare Other | Admitting: Physical Therapy

## 2011-09-09 ENCOUNTER — Encounter: Payer: Medicare Other | Admitting: Physical Therapy

## 2011-09-10 ENCOUNTER — Encounter: Payer: Medicare Other | Admitting: Physical Therapy

## 2011-09-13 ENCOUNTER — Other Ambulatory Visit: Payer: Self-pay | Admitting: *Deleted

## 2011-09-13 MED ORDER — ATORVASTATIN CALCIUM 20 MG PO TABS: 20.0000 mg | ORAL_TABLET | Freq: Every day | ORAL | Status: DC

## 2011-09-15 ENCOUNTER — Encounter: Payer: Medicare Other | Admitting: Physical Therapy

## 2011-09-16 ENCOUNTER — Telehealth: Payer: Self-pay | Admitting: Cardiology

## 2011-09-16 NOTE — Telephone Encounter (Signed)
New Problem:    [facility] called needing clarification on the patient's Lipitor dose. Please call back.

## 2011-09-16 NOTE — Telephone Encounter (Signed)
[facility] called to clarify Lipitor dosage. According to his d/c summary from Oct. states he is on Lipitor 20 mg. Called pt to clarify dosage. He states he is taking 40 mg and he may have told the doctors wrong dose. Advised [facility] that the dose should be 40 mg.

## 2011-09-17 ENCOUNTER — Encounter: Payer: Medicare Other | Admitting: Physical Therapy

## 2011-09-23 ENCOUNTER — Other Ambulatory Visit: Payer: Self-pay | Admitting: Neurosurgery

## 2011-09-23 DIAGNOSIS — M5418 Radiculopathy, sacral and sacrococcygeal region: Secondary | ICD-10-CM

## 2011-09-28 ENCOUNTER — Ambulatory Visit
Admission: RE | Admit: 2011-09-28 | Discharge: 2011-09-28 | Disposition: A | Discharge status: Home / Self Care | Payer: Medicare Other | Source: Ambulatory Visit | Attending: Neurosurgery | Admitting: Neurosurgery

## 2011-09-28 DIAGNOSIS — M5418 Radiculopathy, sacral and sacrococcygeal region: Secondary | ICD-10-CM

## 2011-09-28 MED ORDER — GADOBENATE DIMEGLUMINE 529 MG/ML IV SOLN
16.0000 mL | Freq: Once | INTRAVENOUS | Status: AC | PRN
  Administered 2011-09-28: 16 mL via INTRAVENOUS

## 2011-10-15 ENCOUNTER — Ambulatory Visit (INDEPENDENT_AMBULATORY_CARE_PROVIDER_SITE_OTHER): Payer: Medicare Other | Admitting: Cardiology

## 2011-10-15 ENCOUNTER — Encounter: Payer: Self-pay | Admitting: Cardiology

## 2011-10-15 VITALS — BP 122/68 | HR 78 | Ht 73.0 in | Wt 188.0 lb

## 2011-10-15 DIAGNOSIS — I2510 Atherosclerotic heart disease of native coronary artery without angina pectoris: Secondary | ICD-10-CM

## 2011-10-15 DIAGNOSIS — E785 Hyperlipidemia, unspecified: Secondary | ICD-10-CM

## 2011-10-15 MED ORDER — ATORVASTATIN CALCIUM 40 MG PO TABS: 40.0000 mg | ORAL_TABLET | Freq: Every day | ORAL | Status: DC

## 2011-10-15 NOTE — Patient Instructions (Signed)
Continue your current medication.  I will see you again in 6 months.   

## 2011-10-15 NOTE — Assessment & Plan Note (Signed)
He is status post CABG in 2007. He remains asymptomatic. His last nuclear stress test in May of 2011 was normal. We will continue with his medical therapy and risk factor modification.

## 2011-10-15 NOTE — Assessment & Plan Note (Signed)
Lipid levels are excellent. Continue current dose of Lipitor.

## 2011-10-15 NOTE — Progress Notes (Signed)
Brandon Ray Date of Birth: Oct 05, 1934   History of Present Illness: Brandon Ray is seen today for followup. Brandon Ray reports Brandon Ray is doing well from a cardiac standpoint. Brandon Ray denies any chest pain, shortness of breath, or palpitations. Brandon Ray did develop a small bowel obstruction in September. Brandon Ray underwent surgery. Brandon Ray subsequently developed a pleural effusion that required a Pleurx drainage. Altogether Brandon Ray was in the hospital for 23 days. Brandon Ray reports that during some of that time his heart rate went up to 108 but since then has come back down. Brandon Ray complains that Brandon Ray lost a lot of muscle mass and is just getting back into exercising. Brandon Ray also has developed some neuropathy with foot drop and is undergoing neurologic evaluation.  Current Outpatient Prescriptions on File Prior to Visit  Medication Sig Dispense Refill  . aspirin 81 MG tablet Take 81 mg by mouth daily.       . calcium-vitamin D (OSCAL WITH D) 500-200 MG-UNIT per tablet Take 1 tablet by mouth daily.        . Cholecalciferol (VITAMIN D) 2000 UNITS tablet Take 2,000 Units by mouth daily.        . fish oil-omega-3 fatty acids 1000 MG capsule Take 1,200 mg by mouth daily.        . folic acid (FOLVITE) 818 MCG tablet Take 800 mcg by mouth daily.       . Multiple Vitamin (MULTIVITAMIN) tablet Take 1 tablet by mouth daily.        . Multiple Vitamins-Minerals (PRESERVISION AREDS PO) Take by mouth daily. 2 DAILY       . PHENobarbital (LUMINAL) 97.2 MG tablet Take 97.2 mg by mouth daily.        . phenytoin (DILANTIN) 100 MG ER capsule Take 200 mg by mouth daily.          Allergies  Allergen Reactions  . Amoxicillin Rash    Upper torso only.    Past Medical History  Diagnosis Date  . Coronary artery disease   . Hyperlipidemia   . Seizure disorder   . Seizures   . Heart attack   . Fever   . Weakness   . Leg swelling     both legs  . Abdominal distension   . Abdominal pain   . Constipation   . Bruises easily   . Pleural effusion   .  Small bowel obstruction     Past Surgical History  Procedure Date  . Cardiac catheterization 01/27/2003    NORMAL LEFT VENTRICULAR SIZE WITH MILD FOCAL LATERAL WALL HYPOKINESIA. EF 60%  . Coronary artery bypass graft 2004    LIMA GRAFT TO THE LAD, SAPHENOUS VEIN GRAFT TO THE DIAGONAL, SAPHENOUS VEIN GRAFT TO THE FIRST OM, SAPHENOUS VEIN GRAFT TO THE PDA  . Transurethral resection of prostate   . Hand surgery 2000    left  . Abdominal surgery 05/2011    bowel obstruction  . Ankle fracture surgery 1997    left    History  Smoking status  . Former Smoker -- 1.0 packs/day for 5 years  . Types: Cigarettes  . Quit date: 07/01/1965  Smokeless tobacco  . Never Used    History  Alcohol Use  . Yes    Family History  Problem Relation Age of Onset  . Asthma Mother   . Heart disease Mother   . Coronary artery disease Father   . Heart disease Father     Review of Systems: As noted in  history of present illness.  All other systems were reviewed and are negative.  Physical Exam: BP 122/68  Pulse 78  Ht $R'6\' 1"'ej$  (1.854 m)  Wt 188 lb (85.276 kg)  BMI 24.80 kg/m2 Brandon Ray is a pleasant white male in no acute distress. HEENT exam is unremarkable. No JVD or bruits. Lungs are clear. Cardiac exam reveals a regular rate and rhythm without gallop or murmur. Abdomen is soft and nontender without mass or bruits. She remains without edema. Pedal pulses are good. Brandon Ray is alert and oriented x3. Cranial nerves II through XII are intact. LABORATORY DATA: Dated 09/30/2011 cholesterol 151, triglycerides 91, LDL 71, HDL 61. TSH, chemistry panel, and CBC were overall normal.  Assessment / Plan:

## 2011-12-01 ENCOUNTER — Other Ambulatory Visit (HOSPITAL_COMMUNITY): Payer: Self-pay | Admitting: Neurology

## 2011-12-01 DIAGNOSIS — E785 Hyperlipidemia, unspecified: Secondary | ICD-10-CM

## 2011-12-01 DIAGNOSIS — Z8669 Personal history of other diseases of the nervous system and sense organs: Secondary | ICD-10-CM

## 2011-12-01 DIAGNOSIS — I2510 Atherosclerotic heart disease of native coronary artery without angina pectoris: Secondary | ICD-10-CM

## 2011-12-01 DIAGNOSIS — R269 Unspecified abnormalities of gait and mobility: Secondary | ICD-10-CM

## 2011-12-01 DIAGNOSIS — G609 Hereditary and idiopathic neuropathy, unspecified: Secondary | ICD-10-CM

## 2011-12-14 ENCOUNTER — Other Ambulatory Visit: Payer: Self-pay | Admitting: Neurology

## 2011-12-14 DIAGNOSIS — R569 Unspecified convulsions: Secondary | ICD-10-CM

## 2011-12-20 ENCOUNTER — Ambulatory Visit
Admission: RE | Admit: 2011-12-20 | Discharge: 2011-12-20 | Disposition: A | Discharge status: Home / Self Care | Payer: Medicare Other | Source: Ambulatory Visit | Attending: Neurology | Admitting: Neurology

## 2011-12-20 DIAGNOSIS — R569 Unspecified convulsions: Secondary | ICD-10-CM

## 2011-12-22 ENCOUNTER — Other Ambulatory Visit (HOSPITAL_COMMUNITY): Payer: Self-pay | Admitting: Neurology

## 2011-12-22 DIAGNOSIS — Z8669 Personal history of other diseases of the nervous system and sense organs: Secondary | ICD-10-CM

## 2011-12-22 DIAGNOSIS — E785 Hyperlipidemia, unspecified: Secondary | ICD-10-CM

## 2011-12-22 DIAGNOSIS — I2510 Atherosclerotic heart disease of native coronary artery without angina pectoris: Secondary | ICD-10-CM

## 2011-12-22 DIAGNOSIS — G609 Hereditary and idiopathic neuropathy, unspecified: Secondary | ICD-10-CM

## 2011-12-22 DIAGNOSIS — R269 Unspecified abnormalities of gait and mobility: Secondary | ICD-10-CM

## 2011-12-29 ENCOUNTER — Other Ambulatory Visit (HOSPITAL_COMMUNITY): Payer: Self-pay | Admitting: Physician Assistant

## 2011-12-30 ENCOUNTER — Ambulatory Visit (HOSPITAL_COMMUNITY)
Admission: RE | Admit: 2011-12-30 | Discharge: 2011-12-30 | Disposition: A | Discharge status: Home / Self Care | Payer: Medicare Other | Source: Ambulatory Visit | Attending: Neurology | Admitting: Neurology

## 2011-12-30 DIAGNOSIS — R269 Unspecified abnormalities of gait and mobility: Secondary | ICD-10-CM

## 2011-12-30 DIAGNOSIS — E785 Hyperlipidemia, unspecified: Secondary | ICD-10-CM

## 2011-12-30 DIAGNOSIS — G609 Hereditary and idiopathic neuropathy, unspecified: Secondary | ICD-10-CM

## 2011-12-30 DIAGNOSIS — I2510 Atherosclerotic heart disease of native coronary artery without angina pectoris: Secondary | ICD-10-CM

## 2011-12-30 DIAGNOSIS — Z8669 Personal history of other diseases of the nervous system and sense organs: Secondary | ICD-10-CM

## 2011-12-30 LAB — CSF CELL COUNT WITH DIFFERENTIAL
RBC Count, CSF: 840 /mm3 — ABNORMAL HIGH
Tube #: 4

## 2011-12-30 LAB — GRAM STAIN

## 2011-12-30 LAB — PROTEIN AND GLUCOSE, CSF: Total  Protein, CSF: 43 mg/dL (ref 15–45)

## 2011-12-30 MED ORDER — ACETAMINOPHEN 325 MG PO TABS: 650.0000 mg | ORAL_TABLET | ORAL | Status: DC | PRN

## 2011-12-30 NOTE — Procedures (Signed)
  Technique:  Informed consent was obtained from the patient prior to the procedure, including potential complications of headache, allergy, and pain.   With the patient prone, the lower back was prepped with Betadine.  1% Lidocaine was used for local anesthesia.  Lumbar puncture was performed at the [L3-L4] level using a [20] gauge needle with return of [tinged] CSF with an opening pressure of [12] cm water.   [11] ml of CSF were obtained for laboratory studies.  The patient tolerated the procedure well and there were no apparent complications.  [Successful lumbar puncture.]  The CSF was tinged.

## 2011-12-30 NOTE — Discharge Instructions (Signed)
Post Lumbar Puncture You have had a lumbar puncture. This is also called a spinal tap. This test is done by inserting a needle between the bones of the low back. A small amount of the fluid that surrounds the spinal cord is then removed. This fluid is tested for signs of infection or bleeding. This procedure will usually cause some soreness around the puncture site for a day or two. Occasionally there is a headache for 1 to 3 days after a lumbar puncture. These are made worse by standing and may be associated with dizziness, nausea or vomiting. These symptoms are best relieved by lying flat for several days and drinking extra fluids. Caffeinated beverages (black tea, coffee, or colas) every 6 hours can help reduce the pain. Only take over-the-counter or prescription medicines for pain, discomfort, or fever as directed by your caregiver. If the headache is very severe or your symptoms are not better after 1 to 2 days of treatment, contact your caregiver. An injection called an epidural blood patch can be used to stop the spinal fluid from further leaking and improve the headache. SEEK IMMEDIATE MEDICAL CARE IF:  You develop a fever, chills, or neck stiffness.   You develop a severe headache, dizziness, passing out, or a seizure.   You develop unusual swelling, pain, or redness at the puncture site.   You develop a rash.   You develop a stiff neck with or without a stiff back.   You develop weakness, numbness, or tingling in your legs.  Document Released: 10/14/2004 Document Revised: 08/26/2011 Document Reviewed: 10/14/2008 [facility] Patient Information 2012 [place].

## 2012-01-03 ENCOUNTER — Telehealth (HOSPITAL_COMMUNITY): Payer: Self-pay | Admitting: *Deleted

## 2012-01-03 LAB — CSF CULTURE W GRAM STAIN

## 2012-01-03 NOTE — Telephone Encounter (Signed)
Pt doing well post lumbar puncture.  Site ok, no leaking, no headache.

## 2012-01-06 ENCOUNTER — Other Ambulatory Visit: Payer: Self-pay | Admitting: Neurology

## 2012-01-10 ENCOUNTER — Other Ambulatory Visit: Payer: Self-pay | Admitting: Neurology

## 2012-01-10 ENCOUNTER — Encounter (HOSPITAL_COMMUNITY)
Admission: RE | Admit: 2012-01-10 | Discharge: 2012-01-10 | Disposition: A | Discharge status: Home / Self Care | Payer: Medicare Other | Source: Ambulatory Visit | Attending: Neurology | Admitting: Neurology

## 2012-01-10 DIAGNOSIS — G6189 Other inflammatory polyneuropathies: Secondary | ICD-10-CM | POA: Insufficient documentation

## 2012-01-10 DIAGNOSIS — G622 Polyneuropathy due to other toxic agents: Secondary | ICD-10-CM | POA: Insufficient documentation

## 2012-01-10 MED ORDER — DIPHENHYDRAMINE HCL 25 MG PO CAPS
25.0000 mg | ORAL_CAPSULE | ORAL | Status: DC | PRN
  Administered 2012-01-10: 25 mg via ORAL
  Filled 2012-01-10: qty 1

## 2012-01-10 MED ORDER — DIPHENHYDRAMINE HCL 25 MG PO CAPS
ORAL_CAPSULE | ORAL | Status: AC
  Filled 2012-01-10: qty 1

## 2012-01-10 MED ORDER — IMMUNE GLOBULIN (HUMAN) 20 GM/200ML IV SOLN
0.4000 g/kg | INTRAVENOUS | Status: DC
  Administered 2012-01-10: 35 g via INTRAVENOUS
  Filled 2012-01-10 (×2): qty 350

## 2012-01-10 MED ORDER — ACETAMINOPHEN 325 MG PO TABS
ORAL_TABLET | ORAL | Status: AC
  Filled 2012-01-10: qty 2

## 2012-01-10 MED ORDER — DEXTROSE 5 % IV SOLN
Freq: Every day | INTRAVENOUS | Status: DC
  Administered 2012-01-10: 250 mL via INTRAVENOUS

## 2012-01-10 MED ORDER — IMMUNE GLOBULIN (HUMAN) 1 GM/10ML IV SOLN: 0.5000 mg/kg | INTRAVENOUS | Status: DC

## 2012-01-10 MED ORDER — ACETAMINOPHEN 325 MG PO TABS
650.0000 mg | ORAL_TABLET | ORAL | Status: DC | PRN
  Administered 2012-01-10: 650 mg via ORAL

## 2012-01-10 NOTE — Progress Notes (Signed)
Pt states he cannot come thurs 25 for IVIG treatment as scheduled per md.  Spoke to sandy Young, RN at dr Greer [place] office OK to do 3 days this week and do #4 on May 7

## 2012-01-10 NOTE — Discharge Instructions (Signed)

## 2012-01-11 ENCOUNTER — Other Ambulatory Visit: Payer: Self-pay | Admitting: Neurology

## 2012-01-11 ENCOUNTER — Encounter (HOSPITAL_COMMUNITY): Payer: Self-pay

## 2012-01-11 ENCOUNTER — Encounter (HOSPITAL_COMMUNITY)
Admission: RE | Admit: 2012-01-11 | Discharge: 2012-01-11 | Disposition: A | Discharge status: Home / Self Care | Payer: Medicare Other | Source: Ambulatory Visit | Attending: Neurology | Admitting: Neurology

## 2012-01-11 MED ORDER — ACETAMINOPHEN 325 MG PO TABS
ORAL_TABLET | ORAL | Status: AC
  Filled 2012-01-11: qty 2

## 2012-01-11 MED ORDER — DIPHENHYDRAMINE HCL 25 MG PO CAPS
ORAL_CAPSULE | ORAL | Status: AC
  Filled 2012-01-11: qty 1

## 2012-01-11 MED ORDER — DIPHENHYDRAMINE HCL 25 MG PO CAPS
25.0000 mg | ORAL_CAPSULE | ORAL | Status: DC | PRN
  Administered 2012-01-11: 25 mg via ORAL

## 2012-01-11 MED ORDER — ACETAMINOPHEN 325 MG PO TABS
650.0000 mg | ORAL_TABLET | ORAL | Status: DC | PRN
  Administered 2012-01-11: 650 mg via ORAL

## 2012-01-11 MED ORDER — DEXTROSE 5 % IV SOLN
Freq: Every day | INTRAVENOUS | Status: DC
  Administered 2012-01-11: 250 mL via INTRAVENOUS

## 2012-01-11 MED ORDER — IMMUNE GLOBULIN (HUMAN) 20 GM/200ML IV SOLN
0.4000 g/kg | INTRAVENOUS | Status: DC
  Administered 2012-01-11: 35 g via INTRAVENOUS
  Filled 2012-01-11 (×2): qty 350

## 2012-01-11 NOTE — Progress Notes (Signed)
Patient ID: Brandon Ray, male   DOB: Mar 23, 1935, 77 y.o.   MRN: [id number]

## 2012-01-11 NOTE — Discharge Instructions (Signed)

## 2012-01-12 ENCOUNTER — Encounter (HOSPITAL_COMMUNITY)
Admission: RE | Admit: 2012-01-12 | Discharge: 2012-01-12 | Disposition: A | Discharge status: Home / Self Care | Payer: Medicare Other | Source: Ambulatory Visit | Attending: Neurology | Admitting: Neurology

## 2012-01-12 ENCOUNTER — Encounter (HOSPITAL_COMMUNITY): Payer: Self-pay

## 2012-01-12 MED ORDER — IMMUNE GLOBULIN (HUMAN) 20 GM/200ML IV SOLN
0.4000 g/kg | INTRAVENOUS | Status: DC
  Administered 2012-01-12: 35 g via INTRAVENOUS
  Filled 2012-01-12 (×2): qty 350

## 2012-01-12 MED ORDER — DEXTROSE 5 % IV SOLN
Freq: Every day | INTRAVENOUS | Status: DC
  Administered 2012-01-12: 11:00:00 via INTRAVENOUS

## 2012-01-12 MED ORDER — DIPHENHYDRAMINE HCL 25 MG PO CAPS
25.0000 mg | ORAL_CAPSULE | ORAL | Status: DC | PRN
  Administered 2012-01-12: 25 mg via ORAL

## 2012-01-12 MED ORDER — DIPHENHYDRAMINE HCL 25 MG PO CAPS
ORAL_CAPSULE | ORAL | Status: AC
  Administered 2012-01-12: 25 mg via ORAL
  Filled 2012-01-12: qty 1

## 2012-01-12 MED ORDER — ACETAMINOPHEN 325 MG PO TABS
ORAL_TABLET | ORAL | Status: AC
  Administered 2012-01-12: 650 mg via ORAL
  Filled 2012-01-12: qty 2

## 2012-01-12 MED ORDER — ACETAMINOPHEN 325 MG PO TABS
650.0000 mg | ORAL_TABLET | ORAL | Status: DC | PRN
  Administered 2012-01-12: 650 mg via ORAL

## 2012-01-12 NOTE — Discharge Instructions (Signed)
Refer to printed sheet for next appointment. Short Stay Phone # [place] IS  01/25/12 1130

## 2012-01-13 ENCOUNTER — Encounter (HOSPITAL_COMMUNITY): Payer: Medicare Other

## 2012-01-25 ENCOUNTER — Encounter (HOSPITAL_COMMUNITY)
Admission: RE | Admit: 2012-01-25 | Discharge: 2012-01-25 | Disposition: A | Discharge status: Home / Self Care | Payer: Medicare Other | Source: Ambulatory Visit | Attending: Neurology | Admitting: Neurology

## 2012-01-25 DIAGNOSIS — G6189 Other inflammatory polyneuropathies: Secondary | ICD-10-CM | POA: Insufficient documentation

## 2012-01-25 DIAGNOSIS — G622 Polyneuropathy due to other toxic agents: Secondary | ICD-10-CM | POA: Insufficient documentation

## 2012-01-25 MED ORDER — DIPHENHYDRAMINE HCL 25 MG PO CAPS
25.0000 mg | ORAL_CAPSULE | ORAL | Status: DC | PRN
Start: 2012-01-25 — End: 2012-01-25

## 2012-01-25 MED ORDER — ACETAMINOPHEN 325 MG PO TABS: 650.0000 mg | ORAL_TABLET | ORAL | Status: DC | PRN

## 2012-01-25 MED ORDER — DEXTROSE 5 % IV SOLN
Freq: Every day | INTRAVENOUS | Status: DC
Start: 2012-01-25 — End: 2012-01-25

## 2012-01-25 MED ORDER — IMMUNE GLOBULIN (HUMAN) 20 GM/200ML IV SOLN: 0.4000 g/kg | INTRAVENOUS | Status: DC

## 2012-01-25 NOTE — Progress Notes (Signed)
Received call from [place] per Dr Krista Blue to Hold IVIG today discharge patient and her office will call with a follow-up appointment in 1-2 weeks

## 2012-01-25 NOTE — Discharge Instructions (Signed)
CONTACT DR Rhea Belton OFFICE FOR ANY INCREASED RASH OR ITCHING. EXPECT A CALL FROM HER OFFICE FOR A FOLLOW-UP APPOINTMENT IN THE NEXT 1-2 WEEKS

## 2012-01-25 NOTE — Progress Notes (Signed)
Pt arrived today and states:on 01/19/12 he developed a rash on back that today has spread to chest and arms. The itching has become most noticeable in the last 2 days but he has not taken anything for it. Pt received IVIG as ordered 01/10/12,01/11/12,01/11/14. Then went to [place] for a family trip. Pt arrives today for #4/4 infusion of the IVIG. Placed call to [place] at Dr Rhea Belton office to describe pt's rash and await orders as to whether to infuse IVIG today or no.t

## 2012-01-28 LAB — FUNGUS CULTURE W SMEAR: Fungal Smear: NONE SEEN

## 2012-03-24 ENCOUNTER — Other Ambulatory Visit (INDEPENDENT_AMBULATORY_CARE_PROVIDER_SITE_OTHER): Payer: Self-pay | Admitting: General Surgery

## 2012-03-30 ENCOUNTER — Telehealth (INDEPENDENT_AMBULATORY_CARE_PROVIDER_SITE_OTHER): Payer: Self-pay | Admitting: General Surgery

## 2012-03-30 NOTE — Telephone Encounter (Signed)
PT CALLED TO ASK FOR A REFILL OF MIRALAX RX/ I REVIEWED THIS WITH DR. BYERLY/ SHE SAID HE COULD PURCHASE IT OVER THE COUNTER OR HAVE HIS MEDICAL DR. PRESCRIBE IT/ I NOTIFIED Brandon Ray/GY

## 2012-05-18 ENCOUNTER — Encounter (HOSPITAL_COMMUNITY): Payer: Medicare Other

## 2012-05-19 ENCOUNTER — Ambulatory Visit (HOSPITAL_COMMUNITY): Admission: RE | Admit: 2012-05-19 | Payer: Medicare Other | Source: Ambulatory Visit

## 2012-05-19 ENCOUNTER — Ambulatory Visit (HOSPITAL_COMMUNITY): Payer: Medicare Other

## 2012-05-25 ENCOUNTER — Ambulatory Visit: Payer: Medicare Other | Admitting: *Deleted

## 2012-06-05 ENCOUNTER — Other Ambulatory Visit: Payer: Self-pay | Admitting: Dermatology

## 2012-06-07 ENCOUNTER — Emergency Department (HOSPITAL_COMMUNITY): Payer: Medicare Other

## 2012-06-07 ENCOUNTER — Encounter (HOSPITAL_COMMUNITY): Payer: Self-pay

## 2012-06-07 ENCOUNTER — Inpatient Hospital Stay (HOSPITAL_COMMUNITY)
Admission: EM | Admit: 2012-06-07 | Discharge: 2012-06-08 | DRG: 556 | Disposition: A | Discharge status: Home Health | Payer: Medicare Other | Attending: Internal Medicine | Admitting: Internal Medicine

## 2012-06-07 DIAGNOSIS — G609 Hereditary and idiopathic neuropathy, unspecified: Secondary | ICD-10-CM | POA: Diagnosis present

## 2012-06-07 DIAGNOSIS — R569 Unspecified convulsions: Secondary | ICD-10-CM

## 2012-06-07 DIAGNOSIS — K56609 Unspecified intestinal obstruction, unspecified as to partial versus complete obstruction: Secondary | ICD-10-CM

## 2012-06-07 DIAGNOSIS — J90 Pleural effusion, not elsewhere classified: Secondary | ICD-10-CM

## 2012-06-07 DIAGNOSIS — M47812 Spondylosis without myelopathy or radiculopathy, cervical region: Secondary | ICD-10-CM | POA: Diagnosis present

## 2012-06-07 DIAGNOSIS — M6281 Muscle weakness (generalized): Secondary | ICD-10-CM

## 2012-06-07 DIAGNOSIS — R209 Unspecified disturbances of skin sensation: Secondary | ICD-10-CM | POA: Diagnosis present

## 2012-06-07 DIAGNOSIS — G40909 Epilepsy, unspecified, not intractable, without status epilepticus: Secondary | ICD-10-CM | POA: Diagnosis present

## 2012-06-07 DIAGNOSIS — R000 Tachycardia, unspecified: Secondary | ICD-10-CM

## 2012-06-07 DIAGNOSIS — I2510 Atherosclerotic heart disease of native coronary artery without angina pectoris: Secondary | ICD-10-CM | POA: Diagnosis present

## 2012-06-07 DIAGNOSIS — Z951 Presence of aortocoronary bypass graft: Secondary | ICD-10-CM

## 2012-06-07 DIAGNOSIS — R202 Paresthesia of skin: Secondary | ICD-10-CM

## 2012-06-07 DIAGNOSIS — I252 Old myocardial infarction: Secondary | ICD-10-CM

## 2012-06-07 DIAGNOSIS — Z87891 Personal history of nicotine dependence: Secondary | ICD-10-CM

## 2012-06-07 DIAGNOSIS — E785 Hyperlipidemia, unspecified: Secondary | ICD-10-CM | POA: Diagnosis present

## 2012-06-07 DIAGNOSIS — R29898 Other symptoms and signs involving the musculoskeletal system: Principal | ICD-10-CM | POA: Diagnosis present

## 2012-06-07 DIAGNOSIS — R531 Weakness: Secondary | ICD-10-CM | POA: Diagnosis present

## 2012-06-07 HISTORY — DX: Polyneuropathy, unspecified: G62.9

## 2012-06-07 LAB — BASIC METABOLIC PANEL
BUN: 18 mg/dL (ref 6–23)
Calcium: 9.7 mg/dL (ref 8.4–10.5)
Chloride: 103 mEq/L (ref 96–112)
Creatinine, Ser: 0.82 mg/dL (ref 0.50–1.35)
GFR calc Af Amer: >90 mL/min (ref >90)

## 2012-06-07 LAB — URINALYSIS, ROUTINE W REFLEX MICROSCOPIC
Bilirubin Urine: NEGATIVE
Glucose, UA: NEGATIVE mg/dL
Ketones, ur: NEGATIVE mg/dL
Nitrite: NEGATIVE
Protein, ur: NEGATIVE mg/dL
pH: 7.5 (ref 5.0–8.0)

## 2012-06-07 LAB — RAPID URINE DRUG SCREEN, HOSP PERFORMED
Amphetamines: NOT DETECTED
Barbiturates: POSITIVE — AB
Opiates: NOT DETECTED
Tetrahydrocannabinol: NOT DETECTED

## 2012-06-07 LAB — CBC WITH DIFFERENTIAL/PLATELET
Basophils Absolute: 0 10*3/uL (ref 0.0–0.1)
Basophils Relative: 1 % (ref 0–1)
Eosinophils Relative: 6 % — ABNORMAL HIGH (ref 0–5)
HCT: 45 % (ref 39.0–52.0)
Hemoglobin: 15.5 g/dL (ref 13.0–17.0)
MCH: 32.8 pg (ref 26.0–34.0)
MCHC: 34.4 g/dL (ref 30.0–36.0)
MCV: 95.1 fL (ref 78.0–100.0)
Monocytes Absolute: 0.2 10*3/uL (ref 0.1–1.0)
Monocytes Relative: 6 % (ref 3–12)
Neutro Abs: 1.9 10*3/uL (ref 1.7–7.7)
RDW: 12.5 % (ref 11.5–15.5)

## 2012-06-07 LAB — POCT I-STAT TROPONIN I: Troponin i, poc: 0 ng/mL (ref 0.00–0.08)

## 2012-06-07 LAB — URINE MICROSCOPIC-ADD ON

## 2012-06-07 MED ORDER — FOLIC ACID 800 MCG PO TABS: 800.0000 ug | ORAL_TABLET | Freq: Every day | ORAL | Status: DC

## 2012-06-07 MED ORDER — PHENOBARBITAL 97.2 MG PO TABS
97.2000 mg | ORAL_TABLET | Freq: Every day | ORAL | Status: DC
  Administered 2012-06-08: 97.2 mg via ORAL
  Filled 2012-06-07 (×2): qty 1

## 2012-06-07 MED ORDER — SODIUM CHLORIDE 0.9 % IV SOLN
Freq: Once | INTRAVENOUS | Status: AC
  Administered 2012-06-07: 10:00:00 via INTRAVENOUS

## 2012-06-07 MED ORDER — ASPIRIN 81 MG PO CHEW
81.0000 mg | CHEWABLE_TABLET | Freq: Every day | ORAL | Status: DC
  Administered 2012-06-08: 81 mg via ORAL
  Filled 2012-06-07: qty 1

## 2012-06-07 MED ORDER — ACETAMINOPHEN 325 MG PO TABS: 650.0000 mg | ORAL_TABLET | Freq: Four times a day (QID) | ORAL | Status: DC | PRN

## 2012-06-07 MED ORDER — CALCIUM CARBONATE-VITAMIN D 500-200 MG-UNIT PO TABS
1.0000 | ORAL_TABLET | Freq: Every day | ORAL | Status: DC
  Administered 2012-06-08: 1 via ORAL
  Filled 2012-06-07: qty 1

## 2012-06-07 MED ORDER — ADULT MULTIVITAMIN W/MINERALS CH
1.0000 | ORAL_TABLET | Freq: Every day | ORAL | Status: DC
  Administered 2012-06-08: 1 via ORAL
  Filled 2012-06-07: qty 1

## 2012-06-07 MED ORDER — ACETAMINOPHEN 650 MG RE SUPP: 650.0000 mg | Freq: Four times a day (QID) | RECTAL | Status: DC | PRN

## 2012-06-07 MED ORDER — SODIUM CHLORIDE 0.9 % IJ SOLN
3.0000 mL | Freq: Two times a day (BID) | INTRAMUSCULAR | Status: DC
  Administered 2012-06-07 – 2012-06-08 (×2): 3 mL via INTRAVENOUS

## 2012-06-07 MED ORDER — PHENYTOIN SODIUM EXTENDED 100 MG PO CAPS
100.0000 mg | ORAL_CAPSULE | Freq: Every day | ORAL | Status: DC
  Administered 2012-06-07 – 2012-06-08 (×2): 100 mg via ORAL
  Filled 2012-06-07 (×2): qty 1

## 2012-06-07 MED ORDER — OMEGA-3-ACID ETHYL ESTERS 1 G PO CAPS
1.0000 g | ORAL_CAPSULE | Freq: Every day | ORAL | Status: DC
  Administered 2012-06-08: 1 g via ORAL
  Filled 2012-06-07: qty 1

## 2012-06-07 MED ORDER — ATORVASTATIN CALCIUM 20 MG PO TABS
20.0000 mg | ORAL_TABLET | Freq: Every day | ORAL | Status: DC
  Administered 2012-06-07: 20 mg via ORAL
  Filled 2012-06-07 (×2): qty 1

## 2012-06-07 MED ORDER — FOLIC ACID 1 MG PO TABS
1.0000 mg | ORAL_TABLET | Freq: Every day | ORAL | Status: DC
  Administered 2012-06-08: 1 mg via ORAL
  Filled 2012-06-07: qty 1

## 2012-06-07 MED ORDER — ONDANSETRON HCL 4 MG PO TABS: 4.0000 mg | ORAL_TABLET | Freq: Four times a day (QID) | ORAL | Status: DC | PRN

## 2012-06-07 MED ORDER — SODIUM CHLORIDE 0.9 % IJ SOLN
3.0000 mL | Freq: Two times a day (BID) | INTRAMUSCULAR | Status: DC
  Administered 2012-06-08: 3 mL via INTRAVENOUS

## 2012-06-07 MED ORDER — FISH OIL 1200 MG PO CAPS: 1200.0000 mg | ORAL_CAPSULE | Freq: Every day | ORAL | Status: DC

## 2012-06-07 MED ORDER — ONDANSETRON HCL 4 MG/2ML IJ SOLN: 4.0000 mg | Freq: Four times a day (QID) | INTRAMUSCULAR | Status: DC | PRN

## 2012-06-07 NOTE — ED Notes (Signed)
Attempted to collect urine pt could not void.

## 2012-06-07 NOTE — ED Provider Notes (Signed)
Assumed pt care in CDU.  Pt presents with R sided paresthesia, last normal 10 hrs ago.  No prior hx of stroke.  Pt currently awaits Brain MRI for further evaluation.  Neurology will be consulted and pt received ASA prior to arrival.  Pt has an ABCD2 score of 5, putting him at moderate risk.  On exam, eye with PEERL, EOMI, normal field of gaze, pt has subjective paresthesia to R forarm and fingers, however has normal grip strength and FROM, no overlying skin changes.  Pt also has normal strength to BLE.  Pt does not want to ambulate because he feels weak.  I consulted with neurologist Dr. Tressie Ellis, who recommend noncontrast MRI of Cspine for further evaluation and to call him with result.  Will order the appropriate test.    3:05 PM Report given to oncoming PA, who will f/u on Cspine MRI and will consult neurology.  Pt remains NPO at this time.      Domenic Moras, PA-C 06/08/12 [phone]

## 2012-06-07 NOTE — ED Provider Notes (Signed)
History     CSN: [id number]  Arrival date & time 06/07/12  0750   First MD Initiated Contact with Patient 06/07/12 [phone]      Chief Complaint  Patient presents with  . Numbness    (Consider location/radiation/quality/duration/timing/severity/associated sxs/prior treatment) The history is provided by the patient, the spouse and medical records.    Brandon Ray is a 77 y.o. male presents to the emergency department complaining of numbness and weakness.  The onset of the symptoms was  abrupt starting 10 hours ago.  The patient has associated paresthesias and weakness of the R leg.  The symptoms have been  persistent, gradually worsened.  nothing makes the symptoms worse and nothing makes symptoms better.  The patient denies fever, chills, headache, changes in vision, slurred speech, facial droop, chest pain, shortness of breath, abdominal pain, nausea, vomiting, diarrhea.  He states last eye before going to bed he noticed there was some paresthesias in his anterior forearm.  He woke this morning he complains of paresthesias down the entire right arm. He also complained of paresthesias down the right leg. When he got out of bed this morning his right leg gave way and he caught himself before falling. He denies bowel or bladder incontinence, saddle anesthesia.  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 He also states he had a lumbar puncture in May 2013 for diagnosis of this. He's never had any back surgery, no history of spinal hematoma, no history of DVT.    Past Medical History  Diagnosis Date  . Coronary artery disease   . Hyperlipidemia   . Seizure disorder   . Seizures   . Heart attack   . Fever   . Weakness   . Leg swelling     both legs  . Abdominal distension   . Abdominal pain   . Constipation   . Bruises easily   . Pleural effusion   . Small bowel obstruction   . Peripheral neuropathy     Past  Surgical History  Procedure Date  . Cardiac catheterization 01/27/2003    NORMAL LEFT VENTRICULAR SIZE WITH MILD FOCAL LATERAL WALL HYPOKINESIA. EF 60%  . Coronary artery bypass graft 2004    LIMA GRAFT TO THE LAD, SAPHENOUS VEIN GRAFT TO THE DIAGONAL, SAPHENOUS VEIN GRAFT TO THE FIRST OM, SAPHENOUS VEIN GRAFT TO THE PDA  . Transurethral resection of prostate   . Hand surgery 2000    left  . Abdominal surgery 05/2011    bowel obstruction  . Ankle fracture surgery 1997    left    Family History  Problem Relation Age of Onset  . Asthma Mother   . Heart disease Mother   . Coronary artery disease Father   . Heart disease Father     History  Substance Use Topics  . Smoking status: Former Smoker -- 1.0 packs/day for 5 years    Types: Cigarettes    Quit date: 07/01/1965  . Smokeless tobacco: Never Used  . Alcohol Use: Yes      Review of Systems  Constitutional: Negative for fever, diaphoresis, appetite change, fatigue and unexpected weight change.  HENT: Negative for mouth sores, neck pain and neck stiffness.   Eyes: Negative for visual disturbance.  Respiratory: Negative for cough, chest tightness, shortness of breath and wheezing.   Cardiovascular: Negative for chest pain.  Gastrointestinal: Negative for nausea, vomiting, abdominal pain, diarrhea and constipation.  Genitourinary: Negative for dysuria, urgency, frequency and hematuria.  Musculoskeletal: Positive for gait problem. Negative for back pain.  Skin: Negative for rash.  Neurological: Positive for weakness and numbness. Negative for syncope, light-headedness and headaches.  Psychiatric/Behavioral: Negative for disturbed wake/sleep cycle. The patient is not nervous/anxious.     Allergies  Amoxicillin  Home Medications   Current Outpatient Rx  Name Route Sig Dispense Refill  . ASPIRIN 81 MG PO TABS Oral Take 81 mg by mouth daily.     . ATORVASTATIN CALCIUM 20 MG PO TABS Oral Take 20 mg by mouth daily.    Marland Kitchen  CALCIUM CARBONATE-VITAMIN D 500-200 MG-UNIT PO TABS Oral Take 1 tablet by mouth daily.      Marland Kitchen VITAMIN D 2000 UNITS PO TABS Oral Take 2,000 Units by mouth daily.      . CO Q-10 PO Oral Take 1 tablet by mouth daily.    Marland Kitchen FOLIC ACID 762 MCG PO TABS Oral Take 800 mcg by mouth daily.     Marland Kitchen ONE-DAILY MULTI VITAMINS PO TABS Oral Take 1 tablet by mouth daily.      Marland Kitchen PRESERVISION AREDS PO Oral Take 1 tablet by mouth 2 (two) times daily.     Marland Kitchen FISH OIL 1200 MG PO CAPS Oral Take 1,200 mg by mouth daily.    Marland Kitchen PHENOBARBITAL 97.2 MG PO TABS Oral Take 97.2 mg by mouth daily.      Marland Kitchen PHENYTOIN SODIUM EXTENDED 100 MG PO CAPS Oral Take 100 mg by mouth daily.       BP 111/55  Pulse 66  Temp 98 F (36.7 C) (Oral)  Resp 14  Ht 6' (1.829 m)  Wt 190 lb (86.183 kg)  BMI 25.77 kg/m2  SpO2 95%  Physical Exam  Nursing note and vitals reviewed. Constitutional: He is oriented to person, place, and time. He appears well-developed and well-nourished. No distress.  HENT:  Head: Normocephalic and atraumatic.  Right Ear: Tympanic membrane, external ear and ear canal normal.  Left Ear: Tympanic membrane, external ear and ear canal normal.  Nose: Nose normal.  Mouth/Throat: Uvula is midline, oropharynx is clear and moist and mucous membranes are normal. No oropharyngeal exudate.  Eyes: Conjunctivae normal are normal. No scleral icterus. Right pupil is round and reactive. Left pupil is round and reactive. Pupils are unequal.       Pupils round reactive to light bilaterally Left pupil approx 60mm larger than right pupil  Neck: Normal range of motion. Neck supple.  Cardiovascular: Normal rate, regular rhythm, normal heart sounds and intact distal pulses.  Exam reveals no gallop and no friction rub.   No murmur heard. Pulmonary/Chest: Effort normal and breath sounds normal. No respiratory distress. He has no wheezes. He has no rales.  Abdominal: Soft. Bowel sounds are normal. He exhibits no mass. There is no tenderness.  There is no rebound and no guarding.  Musculoskeletal: Normal range of motion. He exhibits no edema and no tenderness.  Lymphadenopathy:    He has no cervical adenopathy.  Neurological: He is alert and oriented to person, place, and time. He displays abnormal reflex (decreased deep tendon reflex of the lower extremities bilaterally, patient states this is normal). No cranial nerve deficit. He exhibits normal muscle tone. Coordination normal.       Speech is clear and goal oriented, follows commands Major Cranial nerves without deficit, no facial droop Strength in Left upper and lower extremity including dorsiflexion and plantar flexion, strong with strong  Strength  in right upper and lower extremity decreased including dorsiflexion and grip strength  (patient states dorsiflexion weakness on the right is normal, however weakness in the right leg is abnormal.) Sensation normal to light and sharp touch in the upper extremities Sensation decreased to light and sharp touch in the lower extremities Moves extremities without ataxia, coordination intact Normal finger to nose and rapid alternating movements Neg modified romberg, no pronator drift Balance and gait not assessed at this time secondary to weakness of the right leg and concern for fall.  Skin: Skin is warm and dry. No rash noted. He is not diaphoretic.  Psychiatric: He has a normal mood and affect.    ED Course  Procedures (including critical care time)  Labs Reviewed  CBC WITH DIFFERENTIAL - Abnormal; Notable for the following:    WBC 3.8 (*)     Eosinophils Relative 6 (*)     All other components within normal limits  BASIC METABOLIC PANEL - Abnormal; Notable for the following:    Glucose, Bld 102 (*)     GFR calc non Af Amer 85 (*)     All other components within normal limits  URINALYSIS, ROUTINE W REFLEX MICROSCOPIC - Abnormal; Notable for the following:    Leukocytes, UA TRACE (*)     All other components within normal  limits  URINE RAPID DRUG SCREEN (HOSP PERFORMED) - Abnormal; Notable for the following:    Barbiturates POSITIVE (*)     All other components within normal limits  GLUCOSE, CAPILLARY - Abnormal; Notable for the following:    Glucose-Capillary 101 (*)     All other components within normal limits  PROTIME-INR  APTT  POCT I-STAT TROPONIN I  URINE MICROSCOPIC-ADD ON   Ct Head Wo Contrast  06/07/2012  *RADIOLOGY REPORT*  Clinical Data: Right-sided extremity numbness.  CT HEAD WITHOUT CONTRAST  Technique:  Contiguous axial images were obtained from the base of the skull through the vertex without contrast.  Comparison: MRI brain 12/20/2011.  Findings: The ventricles are normal.  No extra-axial fluid collections.  No CT findings for acute hemispheric infarction or intracranial hemorrhage.  Dilated perivascular spaces are noted. No mass lesion.  The brainstem and cerebellum appear normal.  The bony structures are intact.  The paranasal sinuses and mastoid air cells are clear except for scattered ethmoid disease.  The globes are intact.  IMPRESSION:  No acute intracranial findings or mass lesion.   Original Report Authenticated By: P. Kalman Jewels, M.D.      ECG:  Date: 06/07/2012  Rate: 64  Rhythm: normal sinus rhythm  QRS Axis: normal  Intervals: normal  ST/T Wave abnormalities: normal  Conduction Disutrbances:none  Narrative Interpretation: NSR without evidence of ischemia or infarct  Old EKG Reviewed: changes noted    1. Weakness   2. Paresthesias       MDM  [name] resents her right-sided weakness onset greater than 10 hours ago.  Concern for CVA versus spinal epidural hematoma. We'll proceed with a CVA workup and reassess.  BMP, PT/INR, APTT and i-STAT troponin all unremarkable.  CBC with white blood cells 3.8 but otherwise unremarkable. Head CT negative. ECG negative. AP CT to score is a 5 exam at moderate risk.  Reevaluation patient continues to have right leg and  right arm weakness.  MRI ordered.  Will consult neurology.  Patient transferred to the CDU awaiting MRI.  Neurology will be consulted after MRI results. Report given to Domenic Moras PA-C.  Jarrett Soho  [place], PA-C 06/07/12 1417

## 2012-06-07 NOTE — ED Notes (Signed)
Gave report to [place], Therapist, sports in CDU.  Pt will be transferred to CDU 1.

## 2012-06-07 NOTE — ED Notes (Signed)
Pt states he went to bed last night around 2100 and noticed some mild numbness in his right forearm. Woke up this morning at 0600 and felt the rest of the right arm was numb and the right knee down to the foot was numb as well. Did take 325 mg EC ASA at 0700 this morning as well. States his elbow hurts a little but not significant.

## 2012-06-07 NOTE — ED Notes (Signed)
Attempted to collect urine, pt could not void

## 2012-06-07 NOTE — H&P (Signed)
Brandon Ray is an 77 y.o. male.  Patient was seen and examined on June 07, 2012 at 9:10 PM. PCP - Dr. Hulan Fess. Chief Complaint: Right-sided weakness and numbness. HPI: 77 year old male with history of CAD status post CABG, seizure disorder, hyperlipidemia and polyneuropathy for which patient was initially treated in May of this year with IVIG and subsequently developed some rash and was discontinued presents with complaints of increasing numbness in the right upper since last night which worsened today morning with some weakness and difficulty to walk. In the ER patient had MRI of the brain and C-spine with MRA of the brain. MRI C-spine was showing neural encroachment at C4-C5 and C6-C7 levels. Neurologist Dr. Leonel Ramsay has already evaluated the patient and feels patient's weakness may be related to the neural encroachment at the C-spine level but suggested further workup including an admission, MRI L-spine and also EEG. Patient otherwise denies any difficulty speaking, swallowing, visual disturbances, headache, chest pain shortness of breath nausea or vomiting.  Past Medical History  Diagnosis Date  . Coronary artery disease   . Hyperlipidemia   . Seizure disorder   . Seizures   . Heart attack   . Fever   . Weakness   . Leg swelling     both legs  . Abdominal distension   . Abdominal pain   . Constipation   . Bruises easily   . Pleural effusion   . Small bowel obstruction   . Peripheral neuropathy     Past Surgical History  Procedure Date  . Cardiac catheterization 01/27/2003    NORMAL LEFT VENTRICULAR SIZE WITH MILD FOCAL LATERAL WALL HYPOKINESIA. EF 60%  . Coronary artery bypass graft 2004    LIMA GRAFT TO THE LAD, SAPHENOUS VEIN GRAFT TO THE DIAGONAL, SAPHENOUS VEIN GRAFT TO THE FIRST OM, SAPHENOUS VEIN GRAFT TO THE PDA  . Transurethral resection of prostate   . Hand surgery 2000    left  . Abdominal surgery 05/2011    bowel obstruction  . Ankle fracture  surgery 1997    left    Family History  Problem Relation Age of Onset  . Asthma Mother   . Heart disease Mother   . Coronary artery disease Father   . Heart disease Father    Social History:  reports that he quit smoking about 46 years ago. His smoking use included Cigarettes. He has a 5 pack-year smoking history. He has never used smokeless tobacco. He reports that he drinks alcohol. He reports that he does not use illicit drugs.  Allergies:  Allergies  Allergen Reactions  . Amoxicillin Rash    Upper torso only.     (Not in a hospital admission)  Results for orders placed during the hospital encounter of 06/07/12 (from the past 48 hour(s))  CBC WITH DIFFERENTIAL     Status: Abnormal   Collection Time   06/07/12  8:14 AM      Component Value Range Comment   WBC 3.8 (*) 4.0 - 10.5 K/uL    RBC 4.73  4.22 - 5.81 MIL/uL    Hemoglobin 15.5  13.0 - 17.0 g/dL    HCT 45.0  39.0 - 52.0 %    MCV 95.1  78.0 - 100.0 fL    MCH 32.8  26.0 - 34.0 pg    MCHC 34.4  30.0 - 36.0 g/dL    RDW 12.5  11.5 - 15.5 %    Platelets 158  150 - 400 K/uL  Neutrophils Relative 50  43 - 77 %    Neutro Abs 1.9  1.7 - 7.7 K/uL    Lymphocytes Relative 36  12 - 46 %    Lymphs Abs 1.4  0.7 - 4.0 K/uL    Monocytes Relative 6  3 - 12 %    Monocytes Absolute 0.2  0.1 - 1.0 K/uL    Eosinophils Relative 6 (*) 0 - 5 %    Eosinophils Absolute 0.2  0.0 - 0.7 K/uL    Basophils Relative 1  0 - 1 %    Basophils Absolute 0.0  0.0 - 0.1 K/uL   BASIC METABOLIC PANEL     Status: Abnormal   Collection Time   06/07/12  8:14 AM      Component Value Range Comment   Sodium 139  135 - 145 mEq/L    Potassium 4.4  3.5 - 5.1 mEq/L    Chloride 103  96 - 112 mEq/L    CO2 29  19 - 32 mEq/L    Glucose, Bld 102 (*) 70 - 99 mg/dL    BUN 18  6 - 23 mg/dL    Creatinine, Ser 0.82  0.50 - 1.35 mg/dL    Calcium 9.7  8.4 - 10.5 mg/dL    GFR calc non Af Amer 85 (*) >90 mL/min    GFR calc Af Amer >90  >90 mL/min   POCT I-STAT  TROPONIN I     Status: Normal   Collection Time   06/07/12  8:37 AM      Component Value Range Comment   Troponin i, poc 0.00  0.00 - 0.08 ng/mL    Comment 3            PROTIME-INR     Status: Normal   Collection Time   06/07/12  8:38 AM      Component Value Range Comment   Prothrombin Time 13.6  11.6 - 15.2 seconds    INR 1.05  0.00 - 1.49   APTT     Status: Normal   Collection Time   06/07/12  8:38 AM      Component Value Range Comment   aPTT 31  24 - 37 seconds   GLUCOSE, CAPILLARY     Status: Abnormal   Collection Time   06/07/12  9:01 AM      Component Value Range Comment   Glucose-Capillary 101 (*) 70 - 99 mg/dL    Comment 1 Notify RN      Comment 2 Documented in Chart     URINALYSIS, ROUTINE W REFLEX MICROSCOPIC     Status: Abnormal   Collection Time   06/07/12 12:26 PM      Component Value Range Comment   Color, Urine YELLOW  YELLOW    APPearance CLEAR  CLEAR    Specific Gravity, Urine 1.013  1.005 - 1.030    pH 7.5  5.0 - 8.0    Glucose, UA NEGATIVE  NEGATIVE mg/dL    Hgb urine dipstick NEGATIVE  NEGATIVE    Bilirubin Urine NEGATIVE  NEGATIVE    Ketones, ur NEGATIVE  NEGATIVE mg/dL    Protein, ur NEGATIVE  NEGATIVE mg/dL    Urobilinogen, UA 0.2  0.0 - 1.0 mg/dL    Nitrite NEGATIVE  NEGATIVE    Leukocytes, UA TRACE (*) NEGATIVE   URINE RAPID DRUG SCREEN (HOSP PERFORMED)     Status: Abnormal   Collection Time   06/07/12 12:26 PM  Component Value Range Comment   Opiates NONE DETECTED  NONE DETECTED    Cocaine NONE DETECTED  NONE DETECTED    Benzodiazepines NONE DETECTED  NONE DETECTED    Amphetamines NONE DETECTED  NONE DETECTED    Tetrahydrocannabinol NONE DETECTED  NONE DETECTED    Barbiturates POSITIVE (*) NONE DETECTED   URINE MICROSCOPIC-ADD ON     Status: Normal   Collection Time   06/07/12 12:26 PM      Component Value Range Comment   Squamous Epithelial / LPF RARE  RARE    WBC, UA 0-2  <3 WBC/hpf    RBC / HPF 0-2  <3 RBC/hpf    Bacteria, UA RARE   RARE    Ct Head Wo Contrast  06/07/2012  *RADIOLOGY REPORT*  Clinical Data: Right-sided extremity numbness.  CT HEAD WITHOUT CONTRAST  Technique:  Contiguous axial images were obtained from the base of the skull through the vertex without contrast.  Comparison: MRI brain 12/20/2011.  Findings: The ventricles are normal.  No extra-axial fluid collections.  No CT findings for acute hemispheric infarction or intracranial hemorrhage.  Dilated perivascular spaces are noted. No mass lesion.  The brainstem and cerebellum appear normal.  The bony structures are intact.  The paranasal sinuses and mastoid air cells are clear except for scattered ethmoid disease.  The globes are intact.  IMPRESSION:  No acute intracranial findings or mass lesion.   Original Report Authenticated By: P. Kalman Jewels, M.D.    Mr Jodene Nam Head Wo Contrast  06/07/2012  *RADIOLOGY REPORT*  Clinical Data:  Numbness and weakness.  Rule out stroke.  MRI HEAD WITHOUT CONTRAST MRA HEAD WITHOUT CONTRAST  Technique:  Multiplanar, multiecho pulse sequences of the brain and surrounding structures were obtained without intravenous contrast. Angiographic images of the head were obtained using MRA technique without contrast.  Comparison:  CT 06/07/2012, MRI 12/20/2011  MRI HEAD  Findings:  Negative for acute infarct.  Small white matter hyperintensities in the cerebral white matter bilaterally, unchanged from the prior MRI and appear chronic.  These are likely related to chronic microvascular ischemia.  Negative for intracranial hemorrhage or mass lesion.  No edema or mass effect.  Chronic sinusitis is present.  IMPRESSION: Mild chronic microvascular ischemic change.  No acute abnormality.  MRA HEAD  Findings: Both vertebral arteries are patent to the basilar. Dominant right PICA is widely patent.  Left PICA not visualized. The superior cerebellar and posterior cerebral arteries are patent bilaterally.  The posterior communicating artery is patent  bilaterally.  Internal carotid artery is patent bilaterally.  Anterior and middle cerebral arteries are patent bilaterally.  Negative for cerebral aneurysm.  IMPRESSION: Negative   Original Report Authenticated By: Truett Perna, M.D.    Mr Brain Wo Contrast  06/07/2012  *RADIOLOGY REPORT*  Clinical Data:  Numbness and weakness.  Rule out stroke.  MRI HEAD WITHOUT CONTRAST MRA HEAD WITHOUT CONTRAST  Technique:  Multiplanar, multiecho pulse sequences of the brain and surrounding structures were obtained without intravenous contrast. Angiographic images of the head were obtained using MRA technique without contrast.  Comparison:  CT 06/07/2012, MRI 12/20/2011  MRI HEAD  Findings:  Negative for acute infarct.  Small white matter hyperintensities in the cerebral white matter bilaterally, unchanged from the prior MRI and appear chronic.  These are likely related to chronic microvascular ischemia.  Negative for intracranial hemorrhage or mass lesion.  No edema or mass effect.  Chronic sinusitis is present.  IMPRESSION: Mild chronic microvascular ischemic  change.  No acute abnormality.  MRA HEAD  Findings: Both vertebral arteries are patent to the basilar. Dominant right PICA is widely patent.  Left PICA not visualized. The superior cerebellar and posterior cerebral arteries are patent bilaterally.  The posterior communicating artery is patent bilaterally.  Internal carotid artery is patent bilaterally.  Anterior and middle cerebral arteries are patent bilaterally.  Negative for cerebral aneurysm.  IMPRESSION: Negative   Original Report Authenticated By: Truett Perna, M.D.    Mr Cervical Spine Wo Contrast  06/07/2012  *RADIOLOGY REPORT*  Clinical Data: Right arm and right leg numbness and weakness.  MRI CERVICAL SPINE WITHOUT CONTRAST  Technique:  Multiplanar and multiecho pulse sequences of the cervical spine, to include the craniocervical junction and cervicothoracic junction, were obtained according to standard  protocol without intravenous contrast.  Comparison: None.  Findings: Moderately exaggerated cervical lordosis.  No traumatic subluxation or compression fracture.  Heterogeneous marrow likely reflects aging.  Mild pannus.   Normal cord signal throughout.  The individual disc spaces were examined as follows:  C2-3:  Mild bulge.  Mild facet arthropathy.  No neural compression.  C3-4:  Central and rightward protrusion.  Asymmetric right-sided facet arthropathy.  Right C4 nerve root encroachment. Slight effacement anterior subarachnoid space without cord compression.  C4-5:  Mild bulge.  Moderate facet arthropathy.  No definite C5 nerve root encroachment.  Slight effacement anterior subarachnoid space without cord compression.  C5-6:  Central protrusion.  Bilateral facet arthropathy.  Mild effacement anterior subarachnoid space that cord compression. Slight bilateral foraminal narrowing without clear-cut C6 nerve root encroachment.  C6-7:  Central and rightward protrusion with right greater than left uncinate spurring and disc space narrowing.  Slight effacement anterior subarachnoid space without cord compression.  Right greater than left C7 nerve root encroachment likely.  C7-T1:  2 mm facet mediated anterolisthesis.  No disc protrusion or spinal stenosis.  Mild facet arthropathy.  IMPRESSION: Multilevel spondylosis as described.  Potentially symptomatic right- sided neural encroachment could occur at C3-4, and/or C6-7.  No significant cord compression or abnormal cord signal to suggest a myelopathic process.   Original Report Authenticated By: Staci Righter, M.D.     Review of Systems  Constitutional: Negative.   HENT: Negative.   Eyes: Negative.   Respiratory: Negative.   Cardiovascular: Negative.   Gastrointestinal: Negative.   Genitourinary: Negative.   Musculoskeletal: Negative.   Skin: Negative.   Neurological: Positive for tingling.       Numbness of the upper extremity with weaknes of lower  extremity.  Endo/Heme/Allergies: Negative.   Psychiatric/Behavioral: Negative.     Blood pressure 133/58, pulse 65, temperature 98 F (36.7 C), temperature source Oral, resp. rate 14, height 6' (1.829 m), weight 86.183 kg (190 lb), SpO2 95.00%. Physical Exam  Constitutional: He is oriented to person, place, and time. He appears well-developed and well-nourished. No distress.  HENT:  Head: Normocephalic and atraumatic.  Right Ear: External ear normal.  Left Ear: External ear normal.  Nose: Nose normal.  Mouth/Throat: Oropharynx is clear and moist. No oropharyngeal exudate.  Eyes: Conjunctivae normal are normal. Pupils are equal, round, and reactive to light. Right eye exhibits no discharge. Left eye exhibits no discharge. No scleral icterus.  Neck: Normal range of motion. Neck supple.  Cardiovascular: Normal rate, regular rhythm and normal heart sounds.   Respiratory: Effort normal and breath sounds normal. No respiratory distress. He has no wheezes. He has no rales.  GI: Soft. Bowel sounds are normal. He  exhibits no distension. There is no tenderness. There is no rebound.  Musculoskeletal: He exhibits no edema and no tenderness.  Neurological: He is alert and oriented to person, place, and time.       Mild weakness in the upper and lower extremities. No facial asymmetry.  Tongue is midline.  Skin: Skin is warm and dry. He is not diaphoretic.  Psychiatric: His behavior is normal.     Assessment/Plan #1. Right-sided weakness and numbness with difficulty walking  - patient will be placed on neurochecks. MRI L-spine and EEG has been ordered. PT consult. Further recommendations per neurologist.  #2. CAD status post CABG - presently has no chest pain. Continue present medications. #3. Seizure disorder - continue present medications.  CODE STATUS - full code.  Rise Patience 06/07/2012, 9:23 PM

## 2012-06-07 NOTE — ED Provider Notes (Signed)
Assumed care of patient at change of shift.  Sign out received from Brandon Moras, PA-C.  Patient with right sided extremity paresthesias.  Brain MRI/MRA negative.  Plan is for MRI c-spine, consult to neurology following result.    Pt seen in CDU, awaiting c-spine MRI.  Pt reports mild change in right arm numbness, states it is now starting to ache more than it is numb.  Reports continued weakness on his right side.  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  States this is largely unchanged.  CN II-XII intact, EOMs intact, no pronator drift, grip strengths equal bilaterally; strength decreased in triceps of right arm, otherwise strength is equal in all extremities, sensation intact in arms, decreased uniformly in lower extremities.  Unable to test gait as pt states he cannot walk due to current symptoms.    5:28 PM C-spine MRI has resulted.  Will consult neurology as planned.    5:49 PM Pt states he continues to have some sensation return to his right arm.  Does note that he is not feeling confident in his right leg, is unsure if he is going to be able to walk.    6:56 PM I spoke with Dr Brandon Ray regarding c-spine MRI.  He recommends [facility] admission, he will consult. I have discussed this with patient who verbalizes understanding and agrees with plan.    7:29 PM I spoke with Dr Brandon Ray, [facility] hospitalist, who will admit the patient.  Requests that I not place bed request at this time - he will do so after speaking with the neurologist.    Neurologist Dr Brandon Ray has seen and examined patient and agrees with admission.     Results for orders placed during the hospital encounter of 06/07/12  CBC WITH DIFFERENTIAL      Component Value Range   WBC 3.8 (*) 4.0 - 10.5 K/uL   RBC 4.73  4.22 - 5.81 MIL/uL   Hemoglobin 15.5  13.0 - 17.0 g/dL   HCT 45.0  39.0 - 52.0 %   MCV 95.1  78.0 - 100.0 fL   MCH 32.8  26.0 - 34.0 pg   MCHC 34.4  30.0 -  36.0 g/dL   RDW 12.5  11.5 - 15.5 %   Platelets 158  150 - 400 K/uL   Neutrophils Relative 50  43 - 77 %   Neutro Abs 1.9  1.7 - 7.7 K/uL   Lymphocytes Relative 36  12 - 46 %   Lymphs Abs 1.4  0.7 - 4.0 K/uL   Monocytes Relative 6  3 - 12 %   Monocytes Absolute 0.2  0.1 - 1.0 K/uL   Eosinophils Relative 6 (*) 0 - 5 %   Eosinophils Absolute 0.2  0.0 - 0.7 K/uL   Basophils Relative 1  0 - 1 %   Basophils Absolute 0.0  0.0 - 0.1 K/uL  BASIC METABOLIC PANEL      Component Value Range   Sodium 139  135 - 145 mEq/L   Potassium 4.4  3.5 - 5.1 mEq/L   Chloride 103  96 - 112 mEq/L   CO2 29  19 - 32 mEq/L   Glucose, Bld 102 (*) 70 - 99 mg/dL   BUN 18  6 - 23 mg/dL   Creatinine, Ser 0.82  0.50 - 1.35 mg/dL   Calcium 9.7  8.4 - 10.5 mg/dL   GFR calc non Af Amer 85 (*) >90 mL/min  GFR calc Af Amer >90  >90 mL/min  URINALYSIS, ROUTINE W REFLEX MICROSCOPIC      Component Value Range   Color, Urine YELLOW  YELLOW   APPearance CLEAR  CLEAR   Specific Gravity, Urine 1.013  1.005 - 1.030   pH 7.5  5.0 - 8.0   Glucose, UA NEGATIVE  NEGATIVE mg/dL   Hgb urine dipstick NEGATIVE  NEGATIVE   Bilirubin Urine NEGATIVE  NEGATIVE   Ketones, ur NEGATIVE  NEGATIVE mg/dL   Protein, ur NEGATIVE  NEGATIVE mg/dL   Urobilinogen, UA 0.2  0.0 - 1.0 mg/dL   Nitrite NEGATIVE  NEGATIVE   Leukocytes, UA TRACE (*) NEGATIVE  PROTIME-INR      Component Value Range   Prothrombin Time 13.6  11.6 - 15.2 seconds   INR 1.05  0.00 - 1.49  APTT      Component Value Range   aPTT 31  24 - 37 seconds  URINE RAPID DRUG SCREEN (HOSP PERFORMED)      Component Value Range   Opiates NONE DETECTED  NONE DETECTED   Cocaine NONE DETECTED  NONE DETECTED   Benzodiazepines NONE DETECTED  NONE DETECTED   Amphetamines NONE DETECTED  NONE DETECTED   Tetrahydrocannabinol NONE DETECTED  NONE DETECTED   Barbiturates POSITIVE (*) NONE DETECTED  POCT I-STAT TROPONIN I      Component Value Range   Troponin i, poc 0.00  0.00 - 0.08  ng/mL   Comment 3           GLUCOSE, CAPILLARY      Component Value Range   Glucose-Capillary 101 (*) 70 - 99 mg/dL   Comment 1 Notify RN     Comment 2 Documented in Chart    URINE MICROSCOPIC-ADD ON      Component Value Range   Squamous Epithelial / LPF RARE  RARE   WBC, UA 0-2  <3 WBC/hpf   RBC / HPF 0-2  <3 RBC/hpf   Bacteria, UA RARE  RARE   Ct Head Wo Contrast  06/07/2012  *RADIOLOGY REPORT*  Clinical Data: Right-sided extremity numbness.  CT HEAD WITHOUT CONTRAST  Technique:  Contiguous axial images were obtained from the base of the skull through the vertex without contrast.  Comparison: MRI brain 12/20/2011.  Findings: The ventricles are normal.  No extra-axial fluid collections.  No CT findings for acute hemispheric infarction or intracranial hemorrhage.  Dilated perivascular spaces are noted. No mass lesion.  The brainstem and cerebellum appear normal.  The bony structures are intact.  The paranasal sinuses and mastoid air cells are clear except for scattered ethmoid disease.  The globes are intact.  IMPRESSION:  No acute intracranial findings or mass lesion.   Original Report Authenticated By: P. Loralie Champagne, M.D.    Mr Brandon Ray Head Wo Contrast  06/07/2012  *RADIOLOGY REPORT*  Clinical Data:  Numbness and weakness.  Rule out stroke.  MRI HEAD WITHOUT CONTRAST MRA HEAD WITHOUT CONTRAST  Technique:  Multiplanar, multiecho pulse sequences of the brain and surrounding structures were obtained without intravenous contrast. Angiographic images of the head were obtained using MRA technique without contrast.  Comparison:  CT 06/07/2012, MRI 12/20/2011  MRI HEAD  Findings:  Negative for acute infarct.  Small white matter hyperintensities in the cerebral white matter bilaterally, unchanged from the prior MRI and appear chronic.  These are likely related to chronic microvascular ischemia.  Negative for intracranial hemorrhage or mass lesion.  No edema or mass effect.  Chronic sinusitis is  present.   IMPRESSION: Mild chronic microvascular ischemic change.  No acute abnormality.  MRA HEAD  Findings: Both vertebral arteries are patent to the basilar. Dominant right PICA is widely patent.  Left PICA not visualized. The superior cerebellar and posterior cerebral arteries are patent bilaterally.  The posterior communicating artery is patent bilaterally.  Internal carotid artery is patent bilaterally.  Anterior and middle cerebral arteries are patent bilaterally.  Negative for cerebral aneurysm.  IMPRESSION: Negative   Original Report Authenticated By: Truett Perna, M.D.    Mr Brain Wo Contrast  06/07/2012  *RADIOLOGY REPORT*  Clinical Data:  Numbness and weakness.  Rule out stroke.  MRI HEAD WITHOUT CONTRAST MRA HEAD WITHOUT CONTRAST  Technique:  Multiplanar, multiecho pulse sequences of the brain and surrounding structures were obtained without intravenous contrast. Angiographic images of the head were obtained using MRA technique without contrast.  Comparison:  CT 06/07/2012, MRI 12/20/2011  MRI HEAD  Findings:  Negative for acute infarct.  Small white matter hyperintensities in the cerebral white matter bilaterally, unchanged from the prior MRI and appear chronic.  These are likely related to chronic microvascular ischemia.  Negative for intracranial hemorrhage or mass lesion.  No edema or mass effect.  Chronic sinusitis is present.  IMPRESSION: Mild chronic microvascular ischemic change.  No acute abnormality.  MRA HEAD  Findings: Both vertebral arteries are patent to the basilar. Dominant right PICA is widely patent.  Left PICA not visualized. The superior cerebellar and posterior cerebral arteries are patent bilaterally.  The posterior communicating artery is patent bilaterally.  Internal carotid artery is patent bilaterally.  Anterior and middle cerebral arteries are patent bilaterally.  Negative for cerebral aneurysm.  IMPRESSION: Negative   Original Report Authenticated By: Truett Perna, M.D.    Mr  Cervical Spine Wo Contrast  06/07/2012  *RADIOLOGY REPORT*  Clinical Data: Right arm and right leg numbness and weakness.  MRI CERVICAL SPINE WITHOUT CONTRAST  Technique:  Multiplanar and multiecho pulse sequences of the cervical spine, to include the craniocervical junction and cervicothoracic junction, were obtained according to standard protocol without intravenous contrast.  Comparison: None.  Findings: Moderately exaggerated cervical lordosis.  No traumatic subluxation or compression fracture.  Heterogeneous marrow likely reflects aging.  Mild pannus.   Normal cord signal throughout.  The individual disc spaces were examined as follows:  C2-3:  Mild bulge.  Mild facet arthropathy.  No neural compression.  C3-4:  Central and rightward protrusion.  Asymmetric right-sided facet arthropathy.  Right C4 nerve root encroachment. Slight effacement anterior subarachnoid space without cord compression.  C4-5:  Mild bulge.  Moderate facet arthropathy.  No definite C5 nerve root encroachment.  Slight effacement anterior subarachnoid space without cord compression.  C5-6:  Central protrusion.  Bilateral facet arthropathy.  Mild effacement anterior subarachnoid space that cord compression. Slight bilateral foraminal narrowing without clear-cut C6 nerve root encroachment.  C6-7:  Central and rightward protrusion wit  C7-T1:  2 mm facet mediated anterolisthesis.  No disc protrusion or spinal stenosis.  Mild facet arthropathy.  IMPRESSION: Multilevel spondylosis as described.  Potentially symptomatic right- sided neural encroachment could occur at C3-4, and/or C6-7.  No significant cord compression or abnormal cord signal to suggest a myelopathic process.   Original Report Authenticated By: Staci Righter, M.D.       [place], [place] 06/07/12 2046

## 2012-06-07 NOTE — ED Notes (Signed)
CBG completed. [place]

## 2012-06-07 NOTE — Consult Note (Addendum)
Reason for Consult: Right-sided paresthesia and weakness Referring Physician: Vanita Panda, R  CC: Right-sided paresthesia  History is obtained from: Patient, wife  HPI: Brandon Ray is an 77 y.o. 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 He is presenting today with right-sided weakness and paresthesia. He states that last night, he noticed some numbness in his forearm. He subsequently went to sleep, and when he awoke he noticed that he had significant tingling and numbness in his right arm, more on the outside and inside of the arm but the entire length. He also noticed that his leg seems to give way more than it normally does. He was unable to walk, and normally walks unassisted.  He denies any recent illnesses, but did get the flu shot last Friday.   His history of seizures goes back to his mid 50s, with no seizure since that time. He has, however, reduced his Dilantin a couple of months ago. He states that his seizures were "grand mal" but obviously given the time lapse since that diagnosis, this is unclear.  He has a history of lower extremity numbness that started in October of last year. He had an extensive workup performed by Dr. Krista Blue, and received IVIG a single time without improvement. He was referred to [facility], and the physician there is getting records am planning to see him back in October. They may proceed with immunomodulation, per the patient(three choices, one of which is IVIG, but he is unsure what the other two are), but I do not have these records.  He does state that his symptoms, though not resolved, have showed some improvement.  ROS: An 11 point ROS was performed and is negative except as noted in the HPI.  Past Medical History  Diagnosis Date  . Coronary artery disease   . Hyperlipidemia   . Seizure disorder   . Seizures   . Heart  attack   . Fever   . Weakness   . Leg swelling     both legs  . Abdominal distension   . Abdominal pain   . Constipation   . Bruises easily   . Pleural effusion   . Small bowel obstruction   . Peripheral neuropathy     Family History: No history of neuropathy  Social History: Tob: Previous smoker  Exam: Current vital signs: BP 133/58  Pulse 65  Temp 98 F (36.7 C) (Oral)  Resp 14  Ht 6' (1.829 m)  Wt 86.183 kg (190 lb)  BMI 25.77 kg/m2  SpO2 95% Vital signs in last 24 hours: Temp:  [97.7 F (36.5 C)-98 F (36.7 C)] 98 F (36.7 C) (09/18 1009) Pulse Rate:  [59-66] 65  (09/18 1845) Resp:  [8-20] 14  (09/18 1152) BP: (111-139)/(53-63) 133/58 mmHg (09/18 1845) SpO2:  [95 %-98 %] 95 % (09/18 1845) Weight:  [86.183 kg (190 lb)] 86.183 kg (190 lb) (09/18 0756)  General: In bed, no apparent distress CV: Regular rate, no edema Mental Status: Patient is awake, alert, oriented to person, place, month, year, and situation. Immediate and remote memory are intact. Patient is able to give a clear and coherent history. Cranial Nerves: II: Visual Fields are full. Pupils are equal, round, and reactive to light.  Discs are sharp. III,IV, VI: EOMI without ptosis or diploplia.  V,VII: Facial sensation and movement are symmetric.  VIII: hearing is intact to voice X: Uvula  elevates symmetrically XI: Shoulder shrug is symmetric. XII: tongue is midline without atrophy or fasciculations.  Motor: Tone is normal. Bulk is normal.      Right  Left Shoulder Abduction  5/5  5/5 Elbow Flexion   5/5  5/5 Elbow Extension  4/5  5/5 Wrist Flexion   5/5  5/5 Wrist Extension  5/5  5/5 Interossei   5/5  5/5   Able to stand from seated position, but needed to use his arms extensively.  Hip Flexion   4+/5  4+/5 Knee Flexion   4/5  4/5 Knee Extension  4/5  4/5 Ankle Dorsiflexion  4-/5  4/5 Ankle Plantarflexion  4-/5  4/5  Sensory: Sensation is symmetric in the legs to light touch and  temperature, no change circumferentially, but he has decreased sensation bilaterally below the knees.   Deep Tendon Reflexes: 2+ and symmetric in the biceps and triceps, absent at the ankle and knee.  Cerebellar: FNF intact bilaterally Gait: Patient is able to stand with assistance, but is very unsteady.    I have reviewed labs in epic and the results pertinent to this consultation are: CBC, BMP unremarkable  I have reviewed the images obtained:MRI brain-no acute stroke or other cause of his symptoms  Impression: 77 year old male with right arm paresthesias, elbow extension weakness, and right greater than left leg weakness. His history is symmetric polyneuropathy is suggestive that many of his leg symptoms are chronic. The fact that he does feel that his right leg is worse, is concerning. His cervical spine disease could certainly be causing a C7 radiculopathy responsible for paresthesias and elbow extension weakness, but would not explain the worsening of his right leg. If his symptoms are not improving, then a neurosurgical evaluation may be helpful.   His history of seizure and recent reduction in AEDs raises the specter of possible Todd's paralysis. His history of lower back pain and assymetric symptoms would also prompt me to perform an MRI of his L-spine.   An autoimmune cause of both right arm and leg weakness would be unusual, as I suspect a symmetric polyneuropathy previously, not a mononeuritis multiplex.  Recommendations: 1) MRI L-Spine 2) EEG in the morning to look for left-sided seizure focus that could explain a todd's phenomenon. 3) PT evaluation tomorrow.  4) It would be helpful to obtain workup records from Dr. Rhea Belton office as well as office note from [place], MD [facility] [phone]

## 2012-06-07 NOTE — ED Notes (Signed)
Dr. Lockwood at the bedside. 

## 2012-06-07 NOTE — ED Notes (Signed)
Family at bedside. 

## 2012-06-07 NOTE — ED Notes (Signed)
Hannah, PA at the bedside.  

## 2012-06-07 NOTE — ED Notes (Signed)
Patient remains unable to void at this time.

## 2012-06-08 ENCOUNTER — Inpatient Hospital Stay (HOSPITAL_COMMUNITY): Payer: Medicare Other

## 2012-06-08 DIAGNOSIS — R5383 Other fatigue: Secondary | ICD-10-CM

## 2012-06-08 DIAGNOSIS — R5381 Other malaise: Secondary | ICD-10-CM

## 2012-06-08 DIAGNOSIS — R209 Unspecified disturbances of skin sensation: Secondary | ICD-10-CM

## 2012-06-08 LAB — COMPREHENSIVE METABOLIC PANEL
Alkaline Phosphatase: 93 U/L (ref 39–117)
BUN: 16 mg/dL (ref 6–23)
CO2: 28 mEq/L (ref 19–32)
Chloride: 106 mEq/L (ref 96–112)
Creatinine, Ser: 0.76 mg/dL (ref 0.50–1.35)
GFR calc Af Amer: >90 mL/min (ref >90)
GFR calc non Af Amer: 88 mL/min — ABNORMAL LOW (ref >90)
Glucose, Bld: 90 mg/dL (ref 70–99)
Potassium: 4.1 mEq/L (ref 3.5–5.1)
Total Bilirubin: 0.4 mg/dL (ref 0.3–1.2)

## 2012-06-08 LAB — CBC WITH DIFFERENTIAL/PLATELET
Basophils Absolute: 0 10*3/uL (ref 0.0–0.1)
Eosinophils Relative: 7 % — ABNORMAL HIGH (ref 0–5)
Lymphocytes Relative: 33 % (ref 12–46)
MCV: 93.5 fL (ref 78.0–100.0)
Neutro Abs: 2.6 10*3/uL (ref 1.7–7.7)
Neutrophils Relative %: 53 % (ref 43–77)
Platelets: 163 10*3/uL (ref 150–400)
RBC: 4.17 MIL/uL — ABNORMAL LOW (ref 4.22–5.81)
RDW: 12.7 % (ref 11.5–15.5)
WBC: 4.9 10*3/uL (ref 4.0–10.5)

## 2012-06-08 LAB — TSH: TSH: 2.954 u[IU]/mL (ref 0.350–4.500)

## 2012-06-08 NOTE — Care Management Note (Unsigned)
    Page 1 of 1   06/08/2012     4:16:51 PM   CARE MANAGEMENT NOTE 06/08/2012  Patient:  PHILLIPPE, ORLICK   Account Number:  [id number]  Date Initiated:  06/08/2012  Documentation initiated by:  [facility]  Subjective/Objective Assessment:   Admitted with rt sided weakness and numbness.Lives with spouse.     Action/Plan:   PT/OT evals-recommending HHPT, no OT or equipment recommended   Anticipated DC Date:  06/09/2012   Anticipated DC Plan:  [place]  CM consult      Choice offered to / List presented to:  C-1 Patient        [place] arranged  [facility]      [place].   Status of service:  In process, will continue to follow Medicare Important Message given?   (If response is "NO", the following Medicare IM given date fields will be blank) Date Medicare IM given:   Date Additional Medicare IM given:    Discharge Disposition:    Per UR Regulation:  Reviewed for med. necessity/level of care/duration of stay  If discussed at [place] of Stay Meetings, dates discussed:    Comments:  06/08/12 PT recommending HHPT. Spoke with patient and his wife about HHC, they selected [facility] from the [place] list. Alfonzo Beers at [facility] and requested HHPT, order pending. No requipment needs identified. Will continue to follow for d/c needs. Fuller Plan RN, BSN, CCM

## 2012-06-08 NOTE — ED Provider Notes (Signed)
This patient was seen by myself and PA Mut[place]baugh, and sent to the CDU for completion of his care.  I was available for consult during the patient's CDU course, while I was in the ED.  Carmin Muskrat, MD 06/08/12 0800

## 2012-06-08 NOTE — ED Provider Notes (Signed)
Medical screening examination/treatment/procedure(s) were conducted as a shared visit with non-physician practitioner(s) and myself.  I personally evaluated the patient during the encounter On my exam the patient was in no distress.  Deficits noted on PA exam.  I saw the ECG, relevant labs and studies - I agree with the interpretation.  Patient sent to CDU for completion of his care.  Carmin Muskrat, MD 06/08/12 [phone]

## 2012-06-08 NOTE — Progress Notes (Signed)
Occupational Therapy Evaluation Patient Details Name: Brandon Ray MRN: [id number] DOB: January 06, 1935 Today's Date: 06/08/2012 Time: 8786-7672 OT Time Calculation (min): 19 min  OT Assessment / Plan / Recommendation Clinical Impression  Pt admitted with RUE and RLE weakness thus affecting PLOF. MRI revealed cervical spine compression at C3/4, C6/7. Will benefit from acute OT services to address below problem list in prep for d/c home with wife.    OT Assessment  Patient does not need any further OT services    Follow Up Recommendations  Supervision/Assistance - 24 hour;No OT follow up    Barriers to Discharge      Equipment Recommendations  None recommended by OT    Recommendations for Other Services    Frequency       Precautions / Restrictions Precautions Precautions: Fall Restrictions Weight Bearing Restrictions: No   Pertinent Vitals/Pain See vitals    ADL  Eating/Feeding: Performed;Independent Where Assessed - Eating/Feeding: Chair Lower Body Bathing: Simulated;Supervision/safety Where Assessed - Lower Body Bathing: Unsupported sitting Lower Body Dressing: Performed;Supervision/safety Where Assessed - Lower Body Dressing: Unsupported sitting Toilet Transfer: Simulated;Minimal assistance Toilet Transfer Method: Sit to stand Toilet Transfer Equipment:  (chair) Equipment Used: Gait belt Transfers/Ambulation Related to ADLs: min assist for steadying due to posterior lean ADL Comments: Pt able to don socks/shoes with supervision for safety while sitting in chair and leaning anteriorly to reach feet.  Pt able to tie laces on shoes using RUE with no difficulty. Educated pt and wife on using shower seat during bathing for safety and to decrease fall risk.    OT Diagnosis:    OT Problem List:   OT Treatment Interventions:     OT Goals Acute Rehab OT Goals OT Goal Formulation: With patient Time For Goal Achievement: 06/15/12 Potential to Achieve Goals: Good ADL  Goals Pt Will Perform Grooming: with modified independence;Standing at sink ADL Goal: Grooming - Progress: Goal set today Pt Will Transfer to Toilet: with modified independence;Ambulation;Comfort height toilet;with DME ADL Goal: Toilet Transfer - Progress: Goal set today Pt Will Perform Tub/Shower Transfer: Shower transfer;with modified independence;Ambulation;with DME;Shower seat with back ADL Goal: Tub/Shower Transfer - Progress: Goal set today  Visit Information  Last OT Received On: 06/08/12 Assistance Needed: +1    Subjective Data      Prior Functioning  Vision/Perception  Home Living Lives With: Spouse Available Help at Discharge: Available 24 hours/day (spouse of 16 years, and can have some help from [facility]) Type of Home: House Home Access: Stairs to enter [employer] of Steps: 1 Entrance Stairs-Rails: None Home Layout: Two level;Full bath on main level;Able to live on main level with bedroom/bathroom Alternate Level Stairs-Number of Steps: 14 Alternate Level Stairs-Rails:  (pt doesn't need to go upstairs) Bathroom Shower/Tub: Walk-in shower;Door [employer] Toilet: Standard Bathroom Accessibility: Yes How Accessible: Accessible via walker Home Adaptive Equipment: Shower chair with back;Straight cane;Walker - rolling;Built-in shower seat Prior Function Level of Independence: Independent Able to Take Stairs?: Yes Driving: Yes Vocation: Retired Comments: Theatre manager, walking and hiking.  Very active PTA.  Reports some weakness and fatigue after last hospitalization (10 days in hospital) Communication Communication: No difficulties      Cognition  Overall Cognitive Status: Appears within functional limits for tasks assessed/performed Arousal/Alertness: Awake/alert Orientation Level: Appears intact for tasks assessed Behavior During Session: [facility] for tasks performed    Extremity/Trunk Assessment Right Upper Extremity Assessment RUE ROM/Strength/Tone: [facility]  for tasks assessed (4/5 throughout) RUE ROM/Strength/Tone Deficits: weaker grip right (4/5), weaker biceps and triceps (  4/5) right, shoulder WNL 5/5 RUE Sensation: Deficits RUE Sensation Deficits: reports numbness and tingling in just his fingertips now.  He reported that upon admission his whole right arm was numb and tingly RUE Coordination: WFL - gross/fine motor Left Upper Extremity Assessment LUE ROM/Strength/Tone: Within functional levels LUE Sensation: WFL - Light Touch LUE Coordination: WFL - gross/fine motor Right Lower Extremity Assessment RLE ROM/Strength/Tone: Deficits RLE ROM/Strength/Tone Deficits: 4/5 hip flexion, knee flexion and extension, hip abduct/adduct, foot impaired PTA.  DF3-/5, PF 4/5 RLE Sensation: WFL - Light Touch RLE Coordination: WFL - gross/fine motor Left Lower Extremity Assessment LLE ROM/Strength/Tone: Within functional levels LLE Sensation: WFL - Light Touch LLE Coordination: WFL - gross/fine motor   Mobility  Shoulder Instructions  Bed Mobility Bed Mobility: Not assessed Supine to Sit: 7: Independent;HOB flat Sitting - Scoot to Edge of Bed: 7: Independent Details for Bed Mobility Assistance: no issues with bed mobility.   Transfers Sit to Stand: 4: Min assist;From chair/3-in-1;With upper extremity assist Stand to Sit: 4: Min guard;To chair/3-in-1;With armrests;With upper extremity assist Details for Transfer Assistance: Assist for steadying       Exercise General Exercises - Lower Extremity Long Arc Quad: AROM;Both;10 reps;Seated (5 second holds) Hip ABduction/ADduction: Other (comment) (talked about doing seated abduction with t-band) Hip Flexion/Marching: AROM;Both;10 reps;Seated (5 second holds) Toe Raises: AROM;Both;10 reps;Seated Heel Raises: AROM;Both;10 reps;Seated   Balance Balance Balance Assessed: Yes Static Standing Balance Static Standing - Balance Support: No upper extremity supported Static Standing - Level of Assistance:  4: Min assist Static Standing - Comment/# of Minutes: requires assist due to posterior lean   End of Session OT - End of Session Equipment Utilized During Treatment: Gait belt Activity Tolerance: Patient tolerated treatment well Patient left: in chair;with call bell/phone within reach;with family/visitor present Nurse Communication: Mobility status  GO    06/08/2012 Darrol Jump OTR/L Pager [phone] Office [phone]  Darrol Jump 06/08/2012, 5:00 PM

## 2012-06-08 NOTE — Progress Notes (Signed)
EEG completed as ordered.

## 2012-06-08 NOTE — Progress Notes (Signed)
[facility] PROGRESS NOTE    SUBJECTIVE   Still having tingling sensation in arm in the C5-7 distribution.   OBJECTIVE   Vital signs in last 24 hours: Temp:  [97.2 F (36.2 C)-97.9 F (36.6 C)] 97.3 F (36.3 C) (09/19 1040) Pulse Rate:  [65-69] 67  (09/19 1040) Resp:  [14-18] 18  (09/19 1040) BP: (108-133)/(50-70) 120/50 mmHg (09/19 1040) SpO2:  [95 %-98 %] 98 % (09/19 1040) Weight:  [84.6 kg (186 lb 8.2 oz)] 84.6 kg (186 lb 8.2 oz) (09/18 2310)  Intake/Output from previous day:   Intake/Output this shift:   Nutritional status: Cardiac  Past Medical History  Diagnosis Date  . Coronary artery disease   . Hyperlipidemia   . Seizure disorder   . Seizures   . Heart attack   . Fever   . Weakness   . Leg swelling     both legs  . Abdominal distension   . Abdominal pain   . Constipation   . Bruises easily   . Pleural effusion   . Small bowel obstruction   . Peripheral neuropathy     Neurologic ROS negative with exception of above. Musculoskeletal ROS weakness right arm  Neurologic Exam:   Mental Status: Patient is awake, alert, oriented to person, place, month, year, and situation.  Immediate and remote memory are intact.  Patient is able to give a clear and coherent history.   Cranial Nerves:  II: Visual Fields are full.  Discs are sharp.  III,IV, VI: EOMI intact. Pupils are equal, round, and reactive to light. V,VII: Facial sensation and movement are symmetric.  VIII: hearing is intact to voice  X: Uvula elevates symmetrically  XI: Shoulder shrug is symmetric.  XII: tongue is midline without atrophy or fasciculations.  Motor:  Tone is normal. Bulk is normal.  5/5 throughout with exception of right C6 pincher grasp which I was easily able to break.  Sensory: Sensation is symmetric in the legs to light touch and temperature he has decreased sensation bilaterally below the knees to vibration, cold and  light touch.  Deep Tendon Reflexes:  2+ and symmetric in the biceps and triceps except on right bicep--I felt this was decreased compared to left.  absent at the ankle and knee.  Cerebellar:  FNF intact bilaterally  Gait:  Walking hall with therapy with limited problems.    Lab Results: No components found with this basename: cbc,  bmp,  coags,  chol,  tri,  ldl,  hga1c   Lipid Panel No results found for this basename: CHOL,TRIG,HDL,CHOLHDL,VLDL,LDLCALC in the last 72 hours  Studies/Results: Ct Head Wo Contrast  06/07/2012  *RADIOLOGY REPORT*  Clinical Data: Right-sided extremity numbness.  CT HEAD WITHOUT CONTRAST  Technique:  Contiguous axial images were obtained from the base of the skull through the vertex without contrast.  Comparison: MRI brain 12/20/2011.  Findings: The ventricles are normal.  No extra-axial fluid collections.  No CT findings for acute hemispheric infarction or intracranial hemorrhage.  Dilated perivascular spaces are noted. No mass lesion.  The brainstem and cerebellum appear normal.  The bony structures are intact.  The paranasal sinuses and mastoid air cells are clear except for scattered ethmoid disease.  The globes are intact.  IMPRESSION:  No acute intracranial findings  or mass lesion.   Original Report Authenticated By: P. Kalman Jewels, M.D.    Mr Jodene Nam Head Wo Contrast  06/07/2012  *RADIOLOGY REPORT*  Clinical Data:  Numbness and weakness.  Rule out stroke.  MRI HEAD WITHOUT CONTRAST MRA HEAD WITHOUT CONTRAST  Technique:  Multiplanar, multiecho pulse sequences of the brain and surrounding structures were obtained without intravenous contrast. Angiographic images of the head were obtained using MRA technique without contrast.  Comparison:  CT 06/07/2012, MRI 12/20/2011  MRI HEAD  Findings:  Negative for acute infarct.  Small white matter hyperintensities in the cerebral white matter bilaterally, unchanged from the prior MRI and appear chronic.  These are likely  related to chronic microvascular ischemia.  Negative for intracranial hemorrhage or mass lesion.  No edema or mass effect.  Chronic sinusitis is present.  IMPRESSION: Mild chronic microvascular ischemic change.  No acute abnormality.  MRA HEAD  Findings: Both vertebral arteries are patent to the basilar. Dominant right PICA is widely patent.  Left PICA not visualized. The superior cerebellar and posterior cerebral arteries are patent bilaterally.  The posterior communicating artery is patent bilaterally.  Internal carotid artery is patent bilaterally.  Anterior and middle cerebral arteries are patent bilaterally.  Negative for cerebral aneurysm.  IMPRESSION: Negative   Original Report Authenticated By: Truett Perna, M.D.    Mr Brain Wo Contrast  06/07/2012  *RADIOLOGY REPORT*  Clinical Data:  Numbness and weakness.  Rule out stroke.  MRI HEAD WITHOUT CONTRAST MRA HEAD WITHOUT CONTRAST  Technique:  Multiplanar, multiecho pulse sequences of the brain and surrounding structures were obtained without intravenous contrast. Angiographic images of the head were obtained using MRA technique without contrast.  Comparison:  CT 06/07/2012, MRI 12/20/2011  MRI HEAD  Findings:  Negative for acute infarct.  Small white matter hyperintensities in the cerebral white matter bilaterally, unchanged from the prior MRI and appear chronic.  These are likely related to chronic microvascular ischemia.  Negative for intracranial hemorrhage or mass lesion.  No edema or mass effect.  Chronic sinusitis is present.  IMPRESSION: Mild chronic microvascular ischemic change.  No acute abnormality.  MRA HEAD  Findings: Both vertebral arteries are patent to the basilar. Dominant right PICA is widely patent.  Left PICA not visualized. The superior cerebellar and posterior cerebral arteries are patent bilaterally.  The posterior communicating artery is patent bilaterally.  Internal carotid artery is patent bilaterally.  Anterior and middle  cerebral arteries are patent bilaterally.  Negative for cerebral aneurysm.  IMPRESSION: Negative   Original Report Authenticated By: Truett Perna, M.D.    Mr Cervical Spine Wo Contrast  06/07/2012  *RADIOLOGY REPORT*  Clinical Data: Right arm and right leg numbness and weakness.  MRI CERVICAL SPINE WITHOUT CONTRAST  Technique:  Multiplanar and multiecho pulse sequences of the cervical spine, to include the craniocervical junction and cervicothoracic junction, were obtained according to standard protocol without intravenous contrast.  Comparison: None.  Findings: Moderately exaggerated cervical lordosis.  No traumatic subluxation or compression fracture.  Heterogeneous marrow likely reflects aging.  Mild pannus.   Normal cord signal throughout.  The individual disc spaces were examined as follows:  C2-3:  Mild bulge.  Mild facet arthropathy.  No neural compression.  C3-4:  Central and rightward protrusion.  Asymmetric right-sided facet arthropathy.  Right C4 nerve root encroachment. Slight effacement anterior subarachnoid space without cord compression.  C4-5:  Mild bulge.  Moderate facet arthropathy.  No definite C5 nerve root encroachment.  Slight effacement anterior  subarachnoid space without cord compression.  C5-6:  Central protrusion.  Bilateral facet arthropathy.  Mild effacement anterior subarachnoid space that cord compression. Slight bilateral foraminal narrowing without clear-cut C6 nerve root encroachment.  C6-7:  Central and rightward protrusion with right greater than left uncinate spurring and disc space narrowing.  Slight effacement anterior subarachnoid space without cord compression.  Right greater than left C7 nerve root encroachment likely.  C7-T1:  2 mm facet mediated anterolisthesis.  No disc protrusion or spinal stenosis.  Mild facet arthropathy.  IMPRESSION: Multilevel spondylosis as described.  Potentially symptomatic right- sided neural encroachment could occur at C3-4, and/or C6-7.  No  significant cord compression or abnormal cord signal to suggest a myelopathic process.   Original Report Authenticated By: Staci Righter, M.D.    Mr Lumbar Spine Wo Contrast  06/08/2012  *RADIOLOGY REPORT*  Clinical Data: Right leg pain and weakness.  MRI LUMBAR SPINE WITHOUT CONTRAST  Technique:  Multiplanar and multiecho pulse sequences of the lumbar spine were obtained without intravenous contrast.  Comparison: 09/28/2011 sacral MR.  08/27/2011 lumbar spine MR.  Findings: Last fully open disc space is labeled L5-S1.  Present examination incorporates from T11-12 disc space through the S3 level.  Conus just below the T12-L1 disc space.  Bladder is full.  Visualized paravertebral structures otherwise unremarkable.  T11-12:  Minimal Schmorl's node deformity.  T12-L1: Minimal Schmorl's node deformity.  Right lateral disc/osteophyte touches but does not compress the exiting nerve root.  Minimal facet joint degenerative changes on the left.  L1-2:  Minimal facet joint degenerative changes.  L2-3:  Minimal facet joint degenerative changes.  L3-4:  Bulge slightly greater right lateral position approaching but not compressing the exiting right L3 nerve root.  Minimal impression upon the right ventral thecal sac.  Mild facet joint degenerative changes.  L4-5:  Bulge/osteophyte greater right lateral position approaching but not compressing the exiting right L4 nerve root.  Mild facet joint degenerative changes.  L5-S1:  Mild bulge/spur.  Facet joint degenerative changes.  No significant spinal stenosis or nerve root compression.  IMPRESSION: Mild degenerative changes without significant nerve root compression as detailed above.   Original Report Authenticated By: Doug Sou, M.D.     Medications:     Scheduled:    . aspirin  81 mg Oral Daily  . atorvastatin  20 mg Oral Daily  . calcium-vitamin D  1 tablet Oral Daily  . folic acid  1 mg Oral Daily  . multivitamin with minerals  1 tablet Oral Daily  .  omega-3 acid ethyl esters  1 g Oral Daily  . PHENobarbital  97.2 mg Oral Daily  . phenytoin  100 mg Oral Daily  . sodium chloride  3 mL Intravenous Q12H  . sodium chloride  3 mL Intravenous Q12H  . DISCONTD: Fish Oil  1,200 mg Oral Daily  . DISCONTD: folic acid  008 mcg Oral Daily    Assessment/Plan:    Patient Active Hospital Problem List: Right sided weakness (06/07/2012)   Assessment: Right- sided neural encroachment could occur at C3-4, and/or C6-7.   Recommend out patient neurosurgical evaluation.   Seizure (06/07/2012)   Assessment: No further seizures    Plan: Continue current regime  EEG pending.   If EEG normal will sign off.  I have discussed with Dr. Algis Liming.   Etta Quill PA-C [facility] [phone]  06/08/2012, 1:45 PM

## 2012-06-08 NOTE — Discharge Summary (Signed)
Physician Discharge Summary  DEVUN ANNA FYB:[id number] DOB: [birth year] DOA: 06/07/2012  PCP: [name], MD  Admit date: 06/07/2012 Discharge date: 06/08/2012  Recommendations for Outpatient Follow-up:  1. Followup with Dr. Hulan Fess, PCP. 2. Followup with Dr. Marcial Pacas, Neurologist in one week from hospital discharge.  Discharge Diagnoses:  Principal Problem:  *Right sided weakness Active Problems:  Coronary artery disease  Hyperlipidemia  Seizure   Discharge Condition: Improved and stable.  Diet recommendation: Heart healthy diet.  Filed Weights   06/07/12 0756 06/07/12 2310  Weight: 86.183 kg (190 lb) 84.6 kg (186 lb 8.2 oz)    History of present illness:  77 y.o. male with a history of "grand mal seizures" when he was in his 57s, coronary artery disease, peripheral polyneuropathy (currently of unclear etiology, possibly autoimmune) presented on 9/18 with right-sided weakness and paresthesia. Patient said that his last seizure was in 1961 and his Dilantin and Phenobarbital dose was reduced in May 2013 (attributing these medications to his neuropathy). He has had 2 EMGs done recently. He was seen by neurology at [facility] too.   Hospital Course:  1. Right-sided weakness and paresthesia: Unclear etiology. Admitted to the hospital. Neurology consulted. MRI brain did not reveal acute stroke. MRI of the cervical spine revealed spondylosis with concern for encroachment at C3-4 and 6-7 level. EEG is said to be normal. Discussed with neurology who indicated that they do not have clear explanation for his presentation. His symptoms however are improving. Neurology has cleared him for discharge and advised outpatient followup with his primary neurologist. Discussed with neurosurgery on call who reviewed patient's C-spine MRI and indicate that patient has mild spondylosis and would be unlikely to contribute to patient's presentation and did not  recommend any surgical intervention at this time. 2. CAD status post CABG: Asymptomatic and stable. 3. History of seizure disorders: No recent seizure. Continue prior home medications-Dilantin and phenobarbital. 4. History of hyperlipidemia: Continue statins. 5. Peripheral neuropathy: Unclear etiology.? Autoimmune. Outpatient followup with neurology.  Procedures:   EEG.   Consultations:  Neurology  Phone consultation with neurosurgeon on call  Discharge Exam:  Complaints Patient indicates that his right-sided numbness, tingling and weakness are better than last night.  Filed Vitals:   06/08/12 0136 06/08/12 0525 06/08/12 1040 06/08/12 1421  BP: 108/70 119/54 120/50 118/68  Pulse: 69 67 67 86  Temp: 97.9 F (36.6 C) 97.9 F (36.6 C) 97.3 F (36.3 C) 97.8 F (36.6 C)  TempSrc: Oral Oral Oral Oral  Resp: $Remo'16 18 18 18  'tgESW$ Height:      Weight:      SpO2: 95% 95% 98% 93%    General exam: Moderately built and nourished male who is in no obvious distress. Respiratory system: Clear to auscultation. No increased work of breathing. Cardiovascular system: S1 and S2 heard, regular rate and rhythm. No JVD, murmurs or pedal edema. Telemetry shows sinus rhythm. Gastrointestinal system: Abdomen is nondistended, soft and nontender. Normal bowel sounds heard. Central nervous system: Alert and oriented. No cranial nerve deficits. Extremities: Symmetric 5 x 5 power. Patchy decreased sensation to light touch lateral aspect of the right distal forearm and right leg below knee.  Discharge Instructions      Discharge Orders    Future Orders Please Complete By Expires   Diet - low sodium heart healthy      Increase activity slowly      Call MD for:      Comments:  Worsening weakness.       Medication List     As of 06/08/2012  3:25 PM    TAKE these medications         aspirin 81 MG tablet   Take 81 mg by mouth daily.      atorvastatin 20 MG tablet   Commonly known as: LIPITOR    Take 20 mg by mouth daily.      calcium-vitamin D 500-200 MG-UNIT per tablet   Commonly known as: OSCAL WITH D   Take 1 tablet by mouth daily.      CO Q-10 PO   Take 1 tablet by mouth daily.      Fish Oil 1200 MG Caps   Take 1,200 mg by mouth daily.      folic acid 008 MCG tablet   Commonly known as: FOLVITE   Take 800 mcg by mouth daily.      multivitamin tablet   Take 1 tablet by mouth daily.      PHENobarbital 97.2 MG tablet   Commonly known as: LUMINAL   Take 97.2 mg by mouth daily.      phenytoin 100 MG ER capsule   Commonly known as: DILANTIN   Take 100 mg by mouth daily.      PRESERVISION AREDS PO   Take 1 tablet by mouth 2 (two) times daily.      Vitamin D 2000 UNITS tablet   Take 2,000 Units by mouth daily.        Follow-up Information    Schedule an appointment as soon as possible for a visit with [name], MD.   Contact information:   [place] [address] [phone]       Follow up with Marcial Pacas, MD. Schedule an appointment as soon as possible for a visit in 1 week.   Contact information:   [place] [address] [phone]           The results of significant diagnostics from this hospitalization (including imaging, microbiology, ancillary and laboratory) are listed below for reference.    Significant Diagnostic Studies: Ct Head Wo Contrast  06/07/2012  *RADIOLOGY REPORT*  Clinical Data: Right-sided extremity numbness.  CT HEAD WITHOUT CONTRAST  Technique:  Contiguous axial images were obtained from the base of the skull through the vertex without contrast.  Comparison: MRI brain 12/20/2011.  Findings: The ventricles are normal.  No extra-axial fluid collections.  No CT findings for acute hemispheric infarction or intracranial hemorrhage.  Dilated perivascular spaces are noted. No mass lesion.  The brainstem and cerebellum appear normal.  The bony structures are intact.  The paranasal sinuses  and mastoid air cells are clear except for scattered ethmoid disease.  The globes are intact.  IMPRESSION:  No acute intracranial findings or mass lesion.   Original Report Authenticated By: P. Kalman Jewels, M.D.    Mr Jodene Nam Head Wo Contrast  06/07/2012  *RADIOLOGY REPORT*  Clinical Data:  Numbness and weakness.  Rule out stroke.  MRI HEAD WITHOUT CONTRAST MRA HEAD WITHOUT CONTRAST  Technique:  Multiplanar, multiecho pulse sequences of the brain and surrounding structures were obtained without intravenous contrast. Angiographic images of the head were obtained using MRA technique without contrast.  Comparison:  CT 06/07/2012, MRI 12/20/2011  MRI HEAD  Findings:  Negative for acute infarct.  Small white matter hyperintensities in the cerebral white matter bilaterally, unchanged from the prior MRI and appear chronic.  These are likely  related to chronic microvascular ischemia.  Negative for intracranial hemorrhage or mass lesion.  No edema or mass effect.  Chronic sinusitis is present.  IMPRESSION: Mild chronic microvascular ischemic change.  No acute abnormality.  MRA HEAD  Findings: Both vertebral arteries are patent to the basilar. Dominant right PICA is widely patent.  Left PICA not visualized. The superior cerebellar and posterior cerebral arteries are patent bilaterally.  The posterior communicating artery is patent bilaterally.  Internal carotid artery is patent bilaterally.  Anterior and middle cerebral arteries are patent bilaterally.  Negative for cerebral aneurysm.  IMPRESSION: Negative   Original Report Authenticated By: Truett Perna, M.D.    Mr Cervical Spine Wo Contrast  06/07/2012  *RADIOLOGY REPORT*  Clinical Data: Right arm and right leg numbness and weakness.  MRI CERVICAL SPINE WITHOUT CONTRAST  Technique:  Multiplanar and multiecho pulse sequences of the cervical spine, to include the craniocervical junction and cervicothoracic junction, were obtained according to standard protocol without  intravenous contrast.  Comparison: None.  Findings: Moderately exaggerated cervical lordosis.  No traumatic subluxation or compression fracture.  Heterogeneous marrow likely reflects aging.  Mild pannus.   Normal cord signal throughout.  The individual disc spaces were examined as follows:  C2-3:  Mild bulge.  Mild facet arthropathy.  No neural compression.  C3-4:  Central and rightward protrusion.  Asymmetric right-sided facet arthropathy.  Right C4 nerve root encroachment. Slight effacement anterior subarachnoid space without cord compression.  C4-5:  Mild bulge.  Moderate facet arthropathy.  No definite C5 nerve root encroachment.  Slight effacement anterior subarachnoid space without cord compression.  C5-6:  Central protrusion.  Bilateral facet arthropathy.  Mild effacement anterior subarachnoid space that cord compression. Slight bilateral foraminal narrowing without clear-cut C6 nerve root encroachment.  C6-7:  Central and rightward protrusion with right greater than left uncinate spurring and disc space narrowing.  Slight effacement anterior subarachnoid space without cord compression.  Right greater than left C7 nerve root encroachment likely.  C7-T1:  2 mm facet mediated anterolisthesis.  No disc protrusion or spinal stenosis.  Mild facet arthropathy.  IMPRESSION: Multilevel spondylosis as described.  Potentially symptomatic right- sided neural encroachment could occur at C3-4, and/or C6-7.  No significant cord compression or abnormal cord signal to suggest a myelopathic process.   Original Report Authenticated By: Staci Righter, M.D.    Mr Lumbar Spine Wo Contrast  06/08/2012  *RADIOLOGY REPORT*  Clinical Data: Right leg pain and weakness.  MRI LUMBAR SPINE WITHOUT CONTRAST  Technique:  Multiplanar and multiecho pulse sequences of the lumbar spine were obtained without intravenous contrast.  Comparison: 09/28/2011 sacral MR.  08/27/2011 lumbar spine MR.  Findings: Last fully open disc space is labeled  L5-S1.  Present examination incorporates from T11-12 disc space through the S3 level.  Conus just below the T12-L1 disc space.  Bladder is full.  Visualized paravertebral structures otherwise unremarkable.  T11-12:  Minimal Schmorl's node deformity.  T12-L1: Minimal Schmorl's node deformity.  Right lateral disc/osteophyte touches but does not compress the exiting nerve root.  Minimal facet joint degenerative changes on the left.  L1-2:  Minimal facet joint degenerative changes.  L2-3:  Minimal facet joint degenerative changes.  L3-4:  Bulge slightly greater right lateral position approaching but not compressing the exiting right L3 nerve root.  Minimal impression upon the right ventral thecal sac.  Mild facet joint degenerative changes.  L4-5:  Bulge/osteophyte greater right lateral position approaching but not compressing the exiting right L4 nerve root.  Mild facet joint degenerative changes.  L5-S1:  Mild bulge/spur.  Facet joint degenerative changes.  No significant spinal stenosis or nerve root compression.  IMPRESSION: Mild degenerative changes without significant nerve root compression as detailed above.   Original Report Authenticated By: Doug Sou, M.D.     Microbiology: No results found for this or any previous visit (from the past 240 hour(s)).   Labs: Basic Metabolic Panel:  Lab 16/10/96 0535 06/07/12 0814  NA 142 139  K 4.1 4.4  CL 106 103  CO2 28 29  GLUCOSE 90 102*  BUN 16 18  CREATININE 0.76 0.82  CALCIUM 9.0 9.7  MG -- --  PHOS -- --   Liver Function Tests:  Lab 06/08/12 0535  AST 25  ALT 25  ALKPHOS 93  BILITOT 0.4  PROT 6.2  ALBUMIN 3.4*   No results found for this basename: LIPASE:5,AMYLASE:5 in the last 168 hours No results found for this basename: AMMONIA:5 in the last 168 hours CBC:  Lab 06/08/12 0535 06/07/12 0814  WBC 4.9 3.8*  NEUTROABS 2.6 1.9  HGB 13.4 15.5  HCT 39.0 45.0  MCV 93.5 95.1  PLT 163 158   Cardiac Enzymes: No results found for  this basename: CKTOTAL:5,CKMB:5,CKMBINDEX:5,TROPONINI:5 in the last 168 hours BNP: BNP (last 3 results) No results found for this basename: PROBNP:3 in the last 8760 hours CBG:  Lab 06/07/12 0901  GLUCAP 101*    Other lab data: 1. Troponin, POC: 0 2. TSH: 2.954. 3. UA: Negative for features of urinary tract infection. 4. UDS: Positive for barbiturates (patient is taking same for seizures)   Time coordinating discharge:  Greater than 30 minutes  Signed:  Niguel Moure  [facility] 06/08/2012, 3:25 PM

## 2012-06-08 NOTE — Evaluation (Signed)
Physical Therapy Evaluation Patient Details Name: Brandon Ray MRN: [id number] DOB: November 02, 1934 Today's Date: 06/08/2012 Time: 3086-5784 PT Time Calculation (min): 69 min  PT Assessment / Plan / Recommendation Clinical Impression  77 y.o. male admitted to [facility] for acutely progressive weakness and inability to walk.  MRI revealed cercical spine compression at C3/4, C6/7.  He presents with not only right upper, but right lower extremity weakness when compared to the left side.  Roughly 4/5 upper and lower extremity (see notes for specific MMT at each joint).  He also presents with significant balance deficits and weakness with gait as demonstrated by gait pattern.  He would benefit from HHPT with quick transition to outpatient physical therapy (he was scheduled to go to [facility] neurorehabilitation center for balance therapy even before all of this happend-I wonder if they could also try conservative management of his neck?).  I recommend HHPT for transition off of the RW to a cane and progression of HEP and well as a home safety evaluation.      PT Assessment  Patient needs continued PT services    Follow Up Recommendations  Home health PT;Supervision/Assistance - 24 hour    Barriers to Discharge  NA      Equipment Recommendations  None recommended by PT    Recommendations for Other Services   NA  Frequency Min 3X/week    Precautions / Restrictions Precautions Precautions: Fall Restrictions Weight Bearing Restrictions: No   Pertinent Vitals/Pain No reports of pain     Mobility  Bed Mobility Bed Mobility: Supine to Sit;Sitting - Scoot to Edge of Bed Supine to Sit: 7: Independent;HOB flat Sitting - Scoot to Edge of Bed: 7: Independent Details for Bed Mobility Assistance: no issues with bed mobility.   Transfers Transfers: Sit to Stand;Stand to Sit Sit to Stand: 4: Min assist;3: Mod assist;From bed;With upper extremity assist Stand to Sit: 4: Min assist;With upper extremity  assist;With armrests;To chair/3-in-1;To bed Details for Transfer Assistance: min assist to lower to recliner chair, however mod assist at first and min assist with practice to raise up from sitting surface over weak right leg.   Ambulation/Gait Ambulation/Gait Assistance: 1: +2 Total assist Ambulation/Gait: Patient Percentage: 80% Ambulation Distance (Feet): 120 Feet Assistive device: 2 person hand held assist Ambulation/Gait Assistance Details: two person hand held assist needed due to abnormal gait pattern and pt reported weak feeling right leg which at times snaps back into hyperextension due to weak knee stabilizers.  He has increased hip and knee flexion and foot slap type gait pattern that his wife reports is not how he usually walks.  The patient does report he has some issues with toe drag bil feet PTA and thougth that was due to neuropathy and reports "I just had to be careful, nothing like this." Gait Pattern: Step-through pattern;Right hip hike;Wide base of support (right foot slap, increased hip and knee flexion during swing) General Gait Details: high guard gait with wide BOS arms flexed and up    Exercises General Exercises - Lower Extremity Long Arc Quad: AROM;Both;10 reps;Seated (5 second holds) Hip ABduction/ADduction: Other (comment) (talked about doing seated abduction with t-band) Hip Flexion/Marching: AROM;Both;10 reps;Seated (5 second holds) Toe Raises: AROM;Both;10 reps;Seated Heel Raises: AROM;Both;10 reps;Seated   PT Diagnosis: Difficulty walking;Abnormality of gait;Generalized weakness  PT Problem List: Decreased strength;Decreased activity tolerance;Decreased balance;Decreased knowledge of use of DME PT Treatment Interventions: DME instruction;Gait training;Stair training;Functional mobility training;Therapeutic activities;Therapeutic exercise;Balance training;Neuromuscular re-education;Patient/family education   PT Goals Acute Rehab PT Goals  PT Goal Formulation:  With patient/family Time For Goal Achievement: 06/22/12 Potential to Achieve Goals: Good Pt will go Sit to Stand: with supervision PT Goal: Sit to Stand - Progress: Goal set today Pt will go Stand to Sit: with supervision PT Goal: Stand to Sit - Progress: Goal set today Pt will Ambulate: >150 feet;with supervision;with least restrictive assistive device PT Goal: Ambulate - Progress: Goal set today Pt will Perform Home Exercise Program: with supervision, verbal cues required/provided (HEP given on Eval, see copy in hard chart) PT Goal: Perform Home Exercise Program - Progress: Goal set today  Visit Information  Last PT Received On: 06/08/12 Assistance Needed: +1    Subjective Data  Subjective: Pt reports that he normally walks without an assistive device.  He does have a history of significant peripherial neuropathy in his feet bil.   Patient Stated Goal: to go home, improve strength in his right leg and gait.     Prior Functioning  Home Living Lives With: Spouse Available Help at Discharge: Available 24 hours/day (spouse of 65 years, and can have some help from dtr) Type of Home: House Home Access: Stairs to enter [employer] of Steps: 1 Entrance Stairs-Rails: None Home Layout: Two level;Full bath on main level;Able to live on main level with bedroom/bathroom Alternate Level Stairs-Number of Steps: 14 Alternate Level Stairs-Rails:  (pt doesn't need to go upstairs) Bathroom Shower/Tub: Walk-in shower;Door [employer] Toilet: Standard Home Adaptive Equipment: Walker - rolling;Straight cane;Built-in shower seat Prior Function Level of Independence: Independent Able to Take Stairs?: Yes Driving: Yes Vocation: Retired Comments: Theatre manager, walking and hiking.  Very active PTA.  Reports some weakness and fatigue after last hospitalization (10 days in hospital) Communication Communication: No difficulties    Cognition  Overall Cognitive Status: Appears within  functional limits for tasks assessed/performed    Extremity/Trunk Assessment Right Upper Extremity Assessment RUE ROM/Strength/Tone: Deficits RUE ROM/Strength/Tone Deficits: weaker grip right (4/5), weaker biceps and triceps (4/5) right, shoulder WNL 5/5 RUE Sensation: Deficits RUE Sensation Deficits: reports numbness and tingling in just his fingertips now.  He reported that upon admission his whole right arm was numb and tingly RUE Coordination: WFL - gross/fine motor Left Upper Extremity Assessment LUE ROM/Strength/Tone: Within functional levels LUE Sensation: WFL - Light Touch Right Lower Extremity Assessment RLE ROM/Strength/Tone: Deficits RLE ROM/Strength/Tone Deficits: 4/5 hip flexion, knee flexion and extension, hip abduct/adduct, foot impaired PTA.  DF3-/5, PF 4/5 RLE Sensation: WFL - Light Touch RLE Coordination: WFL - gross/fine motor Left Lower Extremity Assessment LLE ROM/Strength/Tone: Within functional levels LLE Sensation: WFL - Light Touch LLE Coordination: WFL - gross/fine motor      End of Session PT - End of Session Activity Tolerance: Patient limited by fatigue Patient left: in chair;with call bell/phone within reach;with family/visitor present (wife in room) Nurse Communication: Other (comment) (d/c recs)   Barbarann Ehlers. [place], [place], DPT [phone]   06/08/2012, 2:49 PM

## 2012-06-08 NOTE — ED Provider Notes (Signed)
Please see my accompanying notes.  Carmin Muskrat, MD 06/08/12 0800

## 2012-06-09 NOTE — Procedures (Signed)
EEG NUMBER:  [id number].  REFERRING PHYSICIAN:  Vernell Leep, MD  INDICATION FOR STUDY:  A 77 year old man with complaint of weakness involving right upper and lower extremities as well as numbness involving right upper extremity.  MRI has not shown signs of stroke nor cervical myelopathy.  Study is being performed to rule out possible epileptic focus.  DESCRIPTION:  This is a routine EEG recording performed during wakefulness.  Predominant background activity consisted of 9 Hz symmetrical alpha rhythm, which attenuated well with eye opening as well as with alerting procedures.  Photic stimulation produced a symmetrical occipital driving response.  Hyperventilation produced a normal transient, symmetrical, generalized slowing response.  No epileptiform discharges were recorded.  There were no areas of abnormal slowing.  INTERPRETATION:  This is a normal EEG recording during wakefulness.  No evidence of an epileptic disorder was demonstrated.     Brandon Char, MD    ZW:CHEN D:  06/08/2012 12:50:08  T:  06/09/2012 03:13:05  Job #:  [id number]

## 2012-06-11 ENCOUNTER — Encounter (HOSPITAL_COMMUNITY): Payer: Self-pay | Admitting: *Deleted

## 2012-06-11 ENCOUNTER — Inpatient Hospital Stay (HOSPITAL_COMMUNITY)
Admission: EM | Admit: 2012-06-11 | Discharge: 2012-06-20 | DRG: 074 | Disposition: A | Discharge status: Rehabilitation Facility | Payer: Medicare Other | Attending: Internal Medicine | Admitting: Internal Medicine

## 2012-06-11 DIAGNOSIS — R292 Abnormal reflex: Secondary | ICD-10-CM

## 2012-06-11 DIAGNOSIS — N319 Neuromuscular dysfunction of bladder, unspecified: Secondary | ICD-10-CM | POA: Diagnosis not present

## 2012-06-11 DIAGNOSIS — I2510 Atherosclerotic heart disease of native coronary artery without angina pectoris: Secondary | ICD-10-CM | POA: Diagnosis present

## 2012-06-11 DIAGNOSIS — D649 Anemia, unspecified: Secondary | ICD-10-CM | POA: Diagnosis not present

## 2012-06-11 DIAGNOSIS — R531 Weakness: Secondary | ICD-10-CM

## 2012-06-11 DIAGNOSIS — Z7982 Long term (current) use of aspirin: Secondary | ICD-10-CM

## 2012-06-11 DIAGNOSIS — R200 Anesthesia of skin: Secondary | ICD-10-CM

## 2012-06-11 DIAGNOSIS — R339 Retention of urine, unspecified: Secondary | ICD-10-CM | POA: Diagnosis present

## 2012-06-11 DIAGNOSIS — Z79899 Other long term (current) drug therapy: Secondary | ICD-10-CM

## 2012-06-11 DIAGNOSIS — Z87891 Personal history of nicotine dependence: Secondary | ICD-10-CM

## 2012-06-11 DIAGNOSIS — E785 Hyperlipidemia, unspecified: Secondary | ICD-10-CM | POA: Diagnosis present

## 2012-06-11 DIAGNOSIS — D696 Thrombocytopenia, unspecified: Secondary | ICD-10-CM | POA: Diagnosis not present

## 2012-06-11 DIAGNOSIS — J9819 Other pulmonary collapse: Secondary | ICD-10-CM | POA: Diagnosis not present

## 2012-06-11 DIAGNOSIS — R569 Unspecified convulsions: Secondary | ICD-10-CM

## 2012-06-11 DIAGNOSIS — Z951 Presence of aortocoronary bypass graft: Secondary | ICD-10-CM

## 2012-06-11 DIAGNOSIS — R338 Other retention of urine: Secondary | ICD-10-CM | POA: Diagnosis not present

## 2012-06-11 DIAGNOSIS — I252 Old myocardial infarction: Secondary | ICD-10-CM

## 2012-06-11 DIAGNOSIS — G6181 Chronic inflammatory demyelinating polyneuritis: Principal | ICD-10-CM | POA: Diagnosis present

## 2012-06-11 DIAGNOSIS — G40909 Epilepsy, unspecified, not intractable, without status epilepticus: Secondary | ICD-10-CM | POA: Diagnosis present

## 2012-06-11 DIAGNOSIS — K5900 Constipation, unspecified: Secondary | ICD-10-CM

## 2012-06-11 LAB — CBC WITH DIFFERENTIAL/PLATELET
Basophils Absolute: 0 10*3/uL (ref 0.0–0.1)
Eosinophils Absolute: 0.2 10*3/uL (ref 0.0–0.7)
Eosinophils Relative: 4 % (ref 0–5)
Lymphocytes Relative: 34 % (ref 12–46)
MCH: 32.8 pg (ref 26.0–34.0)
MCV: 92.9 fL (ref 78.0–100.0)
Neutrophils Relative %: 57 % (ref 43–77)
Platelets: 162 10*3/uL (ref 150–400)
RBC: 4.36 MIL/uL (ref 4.22–5.81)
RDW: 12.4 % (ref 11.5–15.5)
WBC: 4.2 10*3/uL (ref 4.0–10.5)

## 2012-06-11 LAB — COMPREHENSIVE METABOLIC PANEL
ALT: 29 U/L (ref 0–53)
AST: 24 U/L (ref 0–37)
Alkaline Phosphatase: 102 U/L (ref 39–117)
Calcium: 9.1 mg/dL (ref 8.4–10.5)
Potassium: 4.1 mEq/L (ref 3.5–5.1)
Sodium: 139 mEq/L (ref 135–145)
Total Protein: 6.7 g/dL (ref 6.0–8.3)

## 2012-06-11 MED ORDER — ACETAMINOPHEN 650 MG RE SUPP: 650.0000 mg | Freq: Four times a day (QID) | RECTAL | Status: DC | PRN

## 2012-06-11 MED ORDER — ACETAMINOPHEN 325 MG PO TABS: 650.0000 mg | ORAL_TABLET | Freq: Four times a day (QID) | ORAL | Status: DC | PRN

## 2012-06-11 MED ORDER — SODIUM CHLORIDE 0.9 % IV SOLN: INTRAVENOUS | Status: DC

## 2012-06-11 MED ORDER — ASPIRIN 81 MG PO TABS: 81.0000 mg | ORAL_TABLET | Freq: Every day | ORAL | Status: DC

## 2012-06-11 MED ORDER — BISACODYL 10 MG RE SUPP
10.0000 mg | Freq: Every day | RECTAL | Status: DC | PRN
  Administered 2012-06-12 – 2012-06-16 (×2): 10 mg via RECTAL
  Filled 2012-06-11 (×2): qty 1

## 2012-06-11 MED ORDER — FOLIC ACID 800 MCG PO TABS: 800.0000 ug | ORAL_TABLET | Freq: Every day | ORAL | Status: DC

## 2012-06-11 MED ORDER — PHENYTOIN SODIUM EXTENDED 100 MG PO CAPS
100.0000 mg | ORAL_CAPSULE | Freq: Every day | ORAL | Status: DC
  Administered 2012-06-11 – 2012-06-19 (×9): 100 mg via ORAL
  Filled 2012-06-11 (×11): qty 1

## 2012-06-11 MED ORDER — FOLIC ACID 1 MG PO TABS
1.0000 mg | ORAL_TABLET | Freq: Every day | ORAL | Status: DC
  Administered 2012-06-13 – 2012-06-20 (×8): 1 mg via ORAL
  Filled 2012-06-11 (×9): qty 1

## 2012-06-11 MED ORDER — CALCIUM CARBONATE-VITAMIN D 500-200 MG-UNIT PO TABS
1.0000 | ORAL_TABLET | Freq: Every day | ORAL | Status: DC
  Administered 2012-06-12 – 2012-06-17 (×6): 1 via ORAL
  Filled 2012-06-11 (×9): qty 1

## 2012-06-11 MED ORDER — ATORVASTATIN CALCIUM 20 MG PO TABS
20.0000 mg | ORAL_TABLET | Freq: Every day | ORAL | Status: DC
  Administered 2012-06-11 – 2012-06-13 (×3): 20 mg via ORAL
  Filled 2012-06-11 (×4): qty 1

## 2012-06-11 MED ORDER — SODIUM CHLORIDE 0.9 % IJ SOLN
3.0000 mL | Freq: Two times a day (BID) | INTRAMUSCULAR | Status: DC
  Administered 2012-06-11 – 2012-06-20 (×12): 3 mL via INTRAVENOUS

## 2012-06-11 MED ORDER — SODIUM CHLORIDE 0.9 % IJ SOLN: 3.0000 mL | INTRAMUSCULAR | Status: DC | PRN

## 2012-06-11 MED ORDER — SODIUM CHLORIDE 0.9 % IV SOLN: 250.0000 mL | INTRAVENOUS | Status: DC | PRN

## 2012-06-11 MED ORDER — ASPIRIN EC 81 MG PO TBEC
81.0000 mg | DELAYED_RELEASE_TABLET | Freq: Every day | ORAL | Status: DC
  Administered 2012-06-13 – 2012-06-20 (×8): 81 mg via ORAL
  Filled 2012-06-11 (×9): qty 1

## 2012-06-11 MED ORDER — SODIUM CHLORIDE 0.9 % IJ SOLN
3.0000 mL | Freq: Two times a day (BID) | INTRAMUSCULAR | Status: DC
  Administered 2012-06-18 – 2012-06-19 (×2): 3 mL via INTRAVENOUS

## 2012-06-11 MED ORDER — SODIUM CHLORIDE 0.9 % IV SOLN
INTRAVENOUS | Status: DC
  Administered 2012-06-11: 18:00:00 via INTRAVENOUS

## 2012-06-11 MED ORDER — PHENOBARBITAL 97.2 MG PO TABS
97.2000 mg | ORAL_TABLET | Freq: Every day | ORAL | Status: DC
  Administered 2012-06-11 – 2012-06-19 (×9): 97.2 mg via ORAL
  Filled 2012-06-11 (×9): qty 1

## 2012-06-11 MED ORDER — POLYETHYLENE GLYCOL 3350 17 G PO PACK
17.0000 g | PACK | Freq: Every day | ORAL | Status: DC
  Administered 2012-06-12 – 2012-06-18 (×6): 17 g via ORAL
  Filled 2012-06-11 (×9): qty 1

## 2012-06-11 NOTE — Progress Notes (Signed)
Pt has the urge to void but is unable to start, bladder scan showed >300 cc urine. NP Rogue Bussing made aware and new order to I&O cath. I&O cathed 975cc urine returned. Pt resting and tolerated well.

## 2012-06-11 NOTE — ED Provider Notes (Addendum)
History     CSN: [id number]  Arrival date & time 06/11/12  1504   First MD Initiated Contact with Patient 06/11/12 1636      Chief Complaint  Patient presents with  . Weakness  . Numbness    (Consider location/radiation/quality/duration/timing/severity/associated sxs/prior treatment) The history is provided by the patient, the spouse and medical records.   77 year old, male, with a history of peripheral neuropathy, presents emergency department complaining of progressive lower extremity weakness and numbness.  For the past several days.  He, says previously, the numbness was below his knees, and now.  It has progressed up to his lower abdomen. He was admitted to the hospital for similar symptoms.  A few days ago, and had an evaluation for possible stroke.  His evaluation was negative so, he was released to the hospital.  Since being released to his symptoms have progressed.  He has not had a fever, chills, cough, or shortness of breath.  He has not had nausea, vomiting, or diarrhea.  He denies pain anywhere.  His wife states that he had received a vaccination.  Approximately one week ago.  Past Medical History  Diagnosis Date  . Coronary artery disease   . Hyperlipidemia   . Seizure disorder   . Seizures   . Heart attack   . Fever   . Weakness   . Leg swelling     both legs  . Abdominal distension   . Abdominal pain   . Constipation   . Bruises easily   . Pleural effusion   . Small bowel obstruction   . Peripheral neuropathy     Past Surgical History  Procedure Date  . Cardiac catheterization 01/27/2003    NORMAL LEFT VENTRICULAR SIZE WITH MILD FOCAL LATERAL WALL HYPOKINESIA. EF 60%  . Coronary artery bypass graft 2004    LIMA GRAFT TO THE LAD, SAPHENOUS VEIN GRAFT TO THE DIAGONAL, SAPHENOUS VEIN GRAFT TO THE FIRST OM, SAPHENOUS VEIN GRAFT TO THE PDA  . Transurethral resection of prostate   . Hand surgery 2000    left  . Abdominal surgery 05/2011    bowel obstruction  .  Ankle fracture surgery 1997    left    Family History  Problem Relation Age of Onset  . Asthma Mother   . Heart disease Mother   . Coronary artery disease Father   . Heart disease Father     History  Substance Use Topics  . Smoking status: Former Smoker -- 1.0 packs/day for 5 years    Types: Cigarettes    Quit date: 07/01/1965  . Smokeless tobacco: Never Used  . Alcohol Use: Yes      Review of Systems  Constitutional: Negative for fever, chills and diaphoresis.  Respiratory: Negative for cough and shortness of breath.   Cardiovascular: Negative for chest pain.  Gastrointestinal: Positive for constipation. Negative for nausea, vomiting and abdominal pain.  Musculoskeletal: Negative for back pain.  Skin: Negative for rash.  Neurological: Positive for weakness and numbness.  Psychiatric/Behavioral: Negative for confusion.  All other systems reviewed and are negative.    Allergies  Amoxicillin  Home Medications   Current Outpatient Rx  Name Route Sig Dispense Refill  . ASPIRIN 81 MG PO TABS Oral Take 81 mg by mouth daily.     . ATORVASTATIN CALCIUM 20 MG PO TABS Oral Take 20 mg by mouth daily.    Marland Kitchen CALCIUM CARBONATE-VITAMIN D 500-200 MG-UNIT PO TABS Oral Take 1 tablet by mouth daily.      Marland Kitchen  VITAMIN D 2000 UNITS PO TABS Oral Take 2,000 Units by mouth daily.      . CO Q-10 PO Oral Take 1 tablet by mouth daily.    Marland Kitchen FOLIC ACID 030 MCG PO TABS Oral Take 800 mcg by mouth daily.     Marland Kitchen ONE-DAILY MULTI VITAMINS PO TABS Oral Take 1 tablet by mouth daily.      Marland Kitchen PRESERVISION AREDS PO Oral Take 1 tablet by mouth 2 (two) times daily.     Marland Kitchen FISH OIL 1200 MG PO CAPS Oral Take 1,200 mg by mouth daily.    Marland Kitchen PHENOBARBITAL 97.2 MG PO TABS Oral Take 97.2 mg by mouth daily.      Marland Kitchen PHENYTOIN SODIUM EXTENDED 100 MG PO CAPS Oral Take 100 mg by mouth daily.     Marland Kitchen POLYETHYLENE GLYCOL 3350 PO PACK Oral Take 17 g by mouth daily.      BP 123/64  Pulse 75  Temp 97.6 F (36.4 C) (Oral)   Resp 16  Ht 6' (1.829 m)  Wt 187 lb (84.823 kg)  BMI 25.36 kg/m2  SpO2 97%  Physical Exam  Nursing note and vitals reviewed. Constitutional: He is oriented to person, place, and time. He appears well-developed and well-nourished. No distress.  HENT:  Head: Normocephalic and atraumatic.  Eyes: Conjunctivae normal and EOM are normal.  Neck: Normal range of motion. Neck supple.  Cardiovascular: Normal rate, regular rhythm and intact distal pulses.   No murmur heard. Pulmonary/Chest: Effort normal and breath sounds normal.  Abdominal: Soft. Bowel sounds are normal. He exhibits no distension. There is no tenderness.  Musculoskeletal: Normal range of motion. He exhibits no edema and no tenderness.  Neurological: He is alert and oriented to person, place, and time. A sensory deficit is present. No cranial nerve deficit. He displays no Babinski's sign on the right side. He displays no Babinski's sign on the left side.  Reflex Scores:      Patellar reflexes are 0 on the right side and 0 on the left side.      Achilles reflexes are 0 on the right side and 0 on the left side.      He has subjective decreased sensation in his bilateral extremities from his feet.  All the way up to his lower abdomen and around his perineal area.  Decreased strength in dorsi flexion of his ankles.  Skin: Skin is dry. No rash noted.  Psychiatric: He has a normal mood and affect. Thought content normal.    ED Course  Procedures (including critical care time) 77 year old, male, with progressive, numbness, and weakness in his lower extremities, with a reflex.  He, on, examination of his lower extremities.  He has no respiratory symptoms.  My concern is that he may have Guillain-Barr following a vaccination.  Approximately a week ago.  We will perform screening laboratory testing, and consult medicine, and neurology, for admission  Labs Reviewed  COMPREHENSIVE METABOLIC PANEL - Abnormal; Notable for the following:     Glucose, Bld 111 (*)     Total Bilirubin 0.2 (*)     GFR calc non Af Amer 88 (*)     All other components within normal limits  CBC WITH DIFFERENTIAL   No results found.   No diagnosis found.    MDM  Possible Guilian- barre. Lower extremity numbness,weakness, areflexia        Barbara Cower, MD 06/11/12 0923  Barbara Cower, MD 06/11/12 [phone]

## 2012-06-11 NOTE — Progress Notes (Signed)
Pt transferred to 3309 via stretcher with RN and NT on monitor. Pt VVS, alert and oriented, continues to complain of numbness/tingleing/weakness to bilateral lower ext and right upper ext. Will continue to monitor.

## 2012-06-11 NOTE — H&P (Signed)
Brandon Ray is an 77 y.o. male.   PCP - Dr. Hulan Fess. Chief Complaint: Right-sided ue numbness and weakness and LLE numbness  HPI: 77 year old male with history of CAD status post CABG, seizure disorder, hyperlipidemia and polyneuropathy for which patient was initially treated in May of this year with IVIG and subsequently developed some rash and was discontinued presented last week with complaints of increasing numbness in the right upper extremity. Patient had MRI of the brain and C-spine with MRA of the brain. MRI C-spine was showing neural encroachment at C4-C5 and C6-C7 levels. He was discharged home without any new treatment. Neurologist Dr. Leonel Ramsay has already evaluated the patient and feels patient's weakness may be related to the CIDP and that we should attempt to get office records and start plasmapheresis   Past Medical History  Diagnosis Date  . Coronary artery disease   . Hyperlipidemia   . Seizure disorder   . Seizures   . Heart attack   . Fever   . Weakness   . Leg swelling     both legs  . Abdominal distension   . Abdominal pain   . Constipation   . Bruises easily   . Pleural effusion   . Small bowel obstruction   . Peripheral neuropathy     Past Surgical History  Procedure Date  . Cardiac catheterization 01/27/2003    NORMAL LEFT VENTRICULAR SIZE WITH MILD FOCAL LATERAL WALL HYPOKINESIA. EF 60%  . Coronary artery bypass graft 2004    LIMA GRAFT TO THE LAD, SAPHENOUS VEIN GRAFT TO THE DIAGONAL, SAPHENOUS VEIN GRAFT TO THE FIRST OM, SAPHENOUS VEIN GRAFT TO THE PDA  . Transurethral resection of prostate   . Hand surgery 2000    left  . Abdominal surgery 05/2011    bowel obstruction  . Ankle fracture surgery 1997    left    Family History  Problem Relation Age of Onset  . Asthma Mother   . Heart disease Mother   . Coronary artery disease Father   . Heart disease Father    Social History:  reports that he quit smoking about 46 years ago. His  smoking use included Cigarettes. He has a 5 pack-year smoking history. He has never used smokeless tobacco. He reports that he drinks alcohol. He reports that he does not use illicit drugs.  Allergies:  Allergies  Allergen Reactions  . Amoxicillin Rash    Upper torso only.    Medications Prior to Admission  Medication Sig Dispense Refill  . aspirin 81 MG tablet Take 81 mg by mouth daily.       Marland Kitchen atorvastatin (LIPITOR) 20 MG tablet Take 20 mg by mouth daily.      . calcium-vitamin D (OSCAL WITH D) 500-200 MG-UNIT per tablet Take 1 tablet by mouth daily.        . Cholecalciferol (VITAMIN D) 2000 UNITS tablet Take 2,000 Units by mouth daily.        . Coenzyme Q10 (CO Q-10 PO) Take 1 tablet by mouth daily.      . folic acid (FOLVITE) 270 MCG tablet Take 800 mcg by mouth daily.       . Multiple Vitamin (MULTIVITAMIN) tablet Take 1 tablet by mouth daily.        . Multiple Vitamins-Minerals (PRESERVISION AREDS PO) Take 1 tablet by mouth 2 (two) times daily.       . Omega-3 Fatty Acids (FISH OIL) 1200 MG CAPS Take 1,200 mg by mouth  daily.      Marland Kitchen PHENobarbital (LUMINAL) 97.2 MG tablet Take 97.2 mg by mouth daily.        . phenytoin (DILANTIN) 100 MG ER capsule Take 100 mg by mouth daily.       . polyethylene glycol (MIRALAX / GLYCOLAX) packet Take 17 g by mouth daily.        Results for orders placed during the hospital encounter of 06/11/12 (from the past 48 hour(s))  CBC WITH DIFFERENTIAL     Status: Normal   Collection Time   06/11/12  3:18 PM      Component Value Range Comment   WBC 4.2  4.0 - 10.5 K/uL    RBC 4.36  4.22 - 5.81 MIL/uL    Hemoglobin 14.3  13.0 - 17.0 g/dL    HCT 17.0  52.8 - 61.3 %    MCV 92.9  78.0 - 100.0 fL    MCH 32.8  26.0 - 34.0 pg    MCHC 35.3  30.0 - 36.0 g/dL    RDW 36.4  93.2 - 27.9 %    Platelets 162  150 - 400 K/uL    Neutrophils Relative 57  43 - 77 %    Neutro Abs 2.4  1.7 - 7.7 K/uL    Lymphocytes Relative 34  12 - 46 %    Lymphs Abs 1.4  0.7 - 4.0  K/uL    Monocytes Relative 5  3 - 12 %    Monocytes Absolute 0.2  0.1 - 1.0 K/uL    Eosinophils Relative 4  0 - 5 %    Eosinophils Absolute 0.2  0.0 - 0.7 K/uL    Basophils Relative 1  0 - 1 %    Basophils Absolute 0.0  0.0 - 0.1 K/uL   COMPREHENSIVE METABOLIC PANEL     Status: Abnormal   Collection Time   06/11/12  3:18 PM      Component Value Range Comment   Sodium 139  135 - 145 mEq/L    Potassium 4.1  3.5 - 5.1 mEq/L    Chloride 103  96 - 112 mEq/L    CO2 26  19 - 32 mEq/L    Glucose, Bld 111 (*) 70 - 99 mg/dL    BUN 21  6 - 23 mg/dL    Creatinine, Ser 0.07  0.50 - 1.35 mg/dL    Calcium 9.1  8.4 - 54.0 mg/dL    Total Protein 6.7  6.0 - 8.3 g/dL    Albumin 3.6  3.5 - 5.2 g/dL    AST 24  0 - 37 U/L    ALT 29  0 - 53 U/L    Alkaline Phosphatase 102  39 - 117 U/L    Total Bilirubin 0.2 (*) 0.3 - 1.2 mg/dL    GFR calc non Af Amer 88 (*) >90 mL/min    GFR calc Af Amer >90  >90 mL/min    No results found.  Review of Systems  Constitutional: Negative.   HENT: Negative.   Eyes: Negative.   Respiratory: Negative.   Cardiovascular: Negative.   Gastrointestinal: Negative.   Genitourinary: Negative.   Musculoskeletal: Negative.   Skin: Negative.   Neurological: Positive for tingling.       Numbness of the upper extremity with weaknes of lower extremity.  Endo/Heme/Allergies: Negative.   Psychiatric/Behavioral: Negative.     Blood pressure 131/70, pulse 72, temperature 97.6 F (36.4 C), temperature source Oral, resp. rate 14, height  6' (1.829 m), weight 84.823 kg (187 lb), SpO2 97.00%. Physical Exam  Constitutional: He is oriented to person, place, and time. He appears well-developed and well-nourished. No distress.  HENT:  Head: Normocephalic and atraumatic.  Right Ear: External ear normal.  Left Ear: External ear normal.  Nose: Nose normal.  Mouth/Throat: Oropharynx is clear and moist. No oropharyngeal exudate.  Eyes: Conjunctivae normal are normal. Pupils are equal,  round, and reactive to light. Right eye exhibits no discharge. Left eye exhibits no discharge. No scleral icterus.  Neck: Normal range of motion. Neck supple.  Cardiovascular: Normal rate, regular rhythm and normal heart sounds.   Respiratory: Effort normal and breath sounds normal. No respiratory distress. He has no wheezes. He has no rales.  GI: Soft. Bowel sounds are normal. He exhibits no distension. There is no tenderness. There is no rebound.  Musculoskeletal: He exhibits no edema and no tenderness.  Neurological: He is alert and oriented to person, place, and time.       Mild weakness in the upper and lower extremities. No facial asymmetry.  Tongue is midline.  Skin: Skin is warm and dry. He is not diaphoretic.  Psychiatric: His behavior is normal.     Assessment/Plan #1. Multiple neurological deficits suggestive of a polyneuropathy - ? Symptoms are worse now after vaccination 10 days ago. Plan per neurology. #2. CAD status post CABG - presently has no chest pain. Continue home medications.9aspirin and statin) #3. Seizure disorder - continue phenobarbital and phenytoin   CODE STATUS - full code.  Latonia Conrow 06/11/2012, 9:31 PM

## 2012-06-11 NOTE — Consult Note (Signed)
Reason for Consult: Lower extremity weakness Referring Physician: Barbara Cower  CC: Weakness and numbness  History is obtained from: Patient, wife  HPI: Brandon Ray is an 77 y.o. male with a history of progressive lower from a neuropathy that has been progressively getting worse since October of last year. At that time, he had surgery and following surgery had significant worsening. Since that time, he has had steady worsening and was evaluated by Dr. Krista Blue. She felt like it may be an immune mediated neuropathy and therefore gave him a course of IVIG in the past. He states that this may have helped about "10%" but he developed a rash and therefore treatment was aborted after 3 of 5 doses. He has not had other immunosuppression.  He recently saw a physician at [facility] who offered him the choice of plasma exchange, IVIG, or steroids. He had not seen him back yet, but began to have an acute worsening of his symptoms. He states that couple of weeks ago he received multiple vaccinations. Subsequently he noticed worsening of his symptoms, initially in his right leg and right arm, but subsequently in his left leg as well. This has been steadily a sending, and he is now concerned that if he continues to not able to function at home. He also notes that he has been constipated.   ROS: An 11 point ROS was performed and is negative except as noted in the HPI.  Past Medical History  Diagnosis Date  . Coronary artery disease   . Hyperlipidemia   . Seizure disorder   . Seizures   . Heart attack   . Fever   . Weakness   . Leg swelling     both legs  . Abdominal distension   . Abdominal pain   . Constipation   . Bruises easily   . Pleural effusion   . Small bowel obstruction   . Peripheral neuropathy     Family History: No history of neuropathy  Social History: Tob: Previous smoker  Exam: Current vital signs: BP 131/70  Pulse 72  Temp 97.6 F (36.4 C) (Oral)  Resp 14  Ht  6' (1.829 m)  Wt 84.823 kg (187 lb)  BMI 25.36 kg/m2  SpO2 97% Vital signs in last 24 hours: Temp:  [97.6 F (36.4 C)] 97.6 F (36.4 C) (09/22 1512) Pulse Rate:  [70-75] 72  (09/22 1904) Resp:  [14-16] 14  (09/22 1904) BP: (123-131)/(64-70) 131/70 mmHg (09/22 1904) SpO2:  [97 %-98 %] 97 % (09/22 1904) Weight:  [84.823 kg (187 lb)] 84.823 kg (187 lb) (09/22 1514)  General: In bed, no apparent distress CV: Regular rate and rhythm Mental Status: Patient is awake, alert, oriented to person, place, month, year, and situation. Immediate and remote memory are intact. Patient is able to give a clear and coherent history. Cranial Nerves: II: Visual Fields are full. Pupils are equal, round, and reactive to light.  No evidence of papilledema. III,IV, VI: EOMI without ptosis or diploplia.  V,VII: Facial sensation and movement are symmetric.  VIII: hearing is intact to voice X: Uvula elevates symmetrically XI: Shoulder shrug is symmetric. XII: tongue is midline without atrophy or fasciculations.  Motor: Tone is normal. Bulk is normal.     Right  Left Shoulder Abduction  5/5  5/5 Elbow Flexion   5/5  5/5 Elbow Extension  Difficult to test on rightdue to IV placement, but appears to have continued weakness of right extension Left is 5/5. Interossei  5/5  5/5  Hip Flexion   4/5  4+/5 Knee Flexion   4/5  4/5 Knee Extension  4/5  4/5 Ankle Dorsiflexion  4-/5  4/5 Ankle Plantarflexion  4/5  4+/5    Sensory: Sensation is markedly diminished to vibration at the toes and ankle, mildly diminished at the knees bilaterally. Intact to vibration in the fingers Deep Tendon Reflexes: Absent at the knees and ankles, present at the biceps brachii bilaterally Cerebellar: FNF intact bilaterally Gait: Not tested due to safety concerns with patient's weakness.   I have reviewed labs in epic and the results pertinent to this consultation are: CMP unremarkable CBC, no leukocytosis,  unremarkable   I have reviewed the images obtained: MRI L-spine 09/19-no changes to explain his lower extremity weakness  Impression: A 77 year old male with acute worsening of underlying neuropathy in the setting of recent immunizations. 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 I do not have his EMG results to definitively say that this is demyelinating, however by exam he does have a neuropathy and his history does suggest an autoimmune nature.  Given this, I would suggest immune modulating therapy, given his poor response to IVIG coupled with the side effects he experienced previously I feel that he would more likely benefit from plasmapheresis.   He also has a seizure disorder, and without a seizure in 50 years it may be reasonable over the long term to look at reducing his AEDs. I would wait to do this until we have addressed his more acute issues.    Recommendations: 1) admit for plasma exchange 2) I will put a request for IR to place a pheresis catheter tomorrow 3) likely will be able to start plasma exchange tomorrow 4) obtain outside LP and EMG results 5) PT consult 6) Continue phenobarb,dilantin at home doses 7) Will ensure bowel regimen is ordered.    Roland Rack, MD [facility] [phone]

## 2012-06-11 NOTE — ED Notes (Signed)
Patient states he was seen here on Wed and Thurs for weakness and tingling.  Today he has noted weakness and tingling in his left leg and in his lower torso.  Patient has not been able to have bm for 5 days.  He denies incont.  Patient denies headache.  He denies trauma,  Denies back surgery.  Patient had MRI studies during last visit

## 2012-06-12 ENCOUNTER — Inpatient Hospital Stay (HOSPITAL_COMMUNITY): Payer: Medicare Other

## 2012-06-12 ENCOUNTER — Encounter (HOSPITAL_COMMUNITY): Payer: Self-pay | Admitting: Radiology

## 2012-06-12 DIAGNOSIS — R338 Other retention of urine: Secondary | ICD-10-CM | POA: Diagnosis not present

## 2012-06-12 DIAGNOSIS — N319 Neuromuscular dysfunction of bladder, unspecified: Secondary | ICD-10-CM | POA: Diagnosis not present

## 2012-06-12 DIAGNOSIS — R5381 Other malaise: Secondary | ICD-10-CM

## 2012-06-12 DIAGNOSIS — G40909 Epilepsy, unspecified, not intractable, without status epilepticus: Secondary | ICD-10-CM

## 2012-06-12 DIAGNOSIS — R5383 Other fatigue: Secondary | ICD-10-CM

## 2012-06-12 DIAGNOSIS — G6181 Chronic inflammatory demyelinating polyneuritis: Principal | ICD-10-CM

## 2012-06-12 LAB — CBC
HCT: 41.8 % (ref 39.0–52.0)
MCH: 32.4 pg (ref 26.0–34.0)
MCHC: 34.4 g/dL (ref 30.0–36.0)
MCV: 94.1 fL (ref 78.0–100.0)
RDW: 12.7 % (ref 11.5–15.5)

## 2012-06-12 LAB — COMPREHENSIVE METABOLIC PANEL
Albumin: 3.6 g/dL (ref 3.5–5.2)
BUN: 16 mg/dL (ref 6–23)
Calcium: 9.5 mg/dL (ref 8.4–10.5)
Creatinine, Ser: 0.81 mg/dL (ref 0.50–1.35)
GFR calc Af Amer: >90 mL/min (ref >90)
Glucose, Bld: 101 mg/dL — ABNORMAL HIGH (ref 70–99)
Total Protein: 6.6 g/dL (ref 6.0–8.3)

## 2012-06-12 MED ORDER — SODIUM CHLORIDE 0.9 % IV SOLN
INTRAVENOUS | Status: DC
  Administered 2012-06-12: 18:00:00 via INTRAVENOUS
  Filled 2012-06-12 (×9): qty 500

## 2012-06-12 MED ORDER — ALBUMIN HUMAN 25 % IV SOLN
INTRAVENOUS | Status: AC
  Administered 2012-06-12 (×2): via INTRAVENOUS
  Filled 2012-06-12 (×2): qty 800

## 2012-06-12 MED ORDER — CALCIUM CARBONATE ANTACID 500 MG PO CHEW: 2.0000 | CHEWABLE_TABLET | ORAL | Status: DC

## 2012-06-12 MED ORDER — ACD FORMULA A 0.73-2.45-2.2 GM/100ML VI SOLN
Status: AC
  Administered 2012-06-12: 500 mL via INTRAVENOUS
  Filled 2012-06-12: qty 500

## 2012-06-12 MED ORDER — CALCIUM CARBONATE ANTACID 500 MG PO CHEW
2.0000 | CHEWABLE_TABLET | ORAL | Status: DC | PRN
  Filled 2012-06-12 (×2): qty 2

## 2012-06-12 MED ORDER — ACD FORMULA A 0.73-2.45-2.2 GM/100ML VI SOLN
500.0000 mL | Status: DC
  Administered 2012-06-12 – 2012-06-14 (×2): 500 mL via INTRAVENOUS
  Filled 2012-06-12 (×4): qty 500

## 2012-06-12 MED ORDER — SODIUM CHLORIDE 0.9 % IV SOLN
INTRAVENOUS | Status: AC
  Administered 2012-06-12: 16:00:00 via INTRAVENOUS
  Filled 2012-06-12 (×7): qty 1000

## 2012-06-12 MED ORDER — CEFAZOLIN SODIUM-DEXTROSE 2-3 GM-% IV SOLR: 2.0000 g | Freq: Once | INTRAVENOUS | Status: DC

## 2012-06-12 MED ORDER — MIDAZOLAM HCL 5 MG/5ML IJ SOLN
INTRAMUSCULAR | Status: AC | PRN
  Administered 2012-06-12: 1 mg via INTRAVENOUS

## 2012-06-12 MED ORDER — SODIUM CHLORIDE 0.9 % IV SOLN
Freq: Once | INTRAVENOUS | Status: DC
  Filled 2012-06-12: qty 500

## 2012-06-12 MED ORDER — DIPHENHYDRAMINE HCL 25 MG PO CAPS: 25.0000 mg | ORAL_CAPSULE | Freq: Four times a day (QID) | ORAL | Status: DC | PRN

## 2012-06-12 MED ORDER — CALCIUM GLUCONATE 10 % IV SOLN
2.0000 g | INTRAVENOUS | Status: DC | PRN
  Filled 2012-06-12: qty 20

## 2012-06-12 MED ORDER — SODIUM CHLORIDE 0.9 % IV SOLN
4.0000 g | INTRAVENOUS | Status: DC | PRN
  Filled 2012-06-12 (×2): qty 40

## 2012-06-12 MED ORDER — HEPARIN SODIUM (PORCINE) 1000 UNIT/ML IJ SOLN: 1000.0000 [IU] | Freq: Once | INTRAMUSCULAR | Status: DC

## 2012-06-12 MED ORDER — SODIUM CHLORIDE 0.9 % IV SOLN
2.0000 g | INTRAVENOUS | Status: DC | PRN
  Administered 2012-06-16: 2 g via INTRAVENOUS
  Filled 2012-06-12 (×2): qty 20

## 2012-06-12 MED ORDER — VANCOMYCIN HCL IN DEXTROSE 1-5 GM/200ML-% IV SOLN
1000.0000 mg | Freq: Once | INTRAVENOUS | Status: AC
  Administered 2012-06-12: 1000 mg via INTRAVENOUS
  Filled 2012-06-12: qty 200

## 2012-06-12 MED ORDER — HEPARIN SODIUM (PORCINE) 1000 UNIT/ML IJ SOLN
1000.0000 [IU] | Freq: Once | INTRAMUSCULAR | Status: AC
  Administered 2012-06-12: 1000 [IU]

## 2012-06-12 MED ORDER — ONDANSETRON HCL 4 MG/2ML IJ SOLN
4.0000 mg | Freq: Four times a day (QID) | INTRAMUSCULAR | Status: DC | PRN
  Administered 2012-06-12 – 2012-06-16 (×2): 4 mg via INTRAVENOUS
  Filled 2012-06-12 (×2): qty 2

## 2012-06-12 MED ORDER — FENTANYL CITRATE 0.05 MG/ML IJ SOLN
INTRAMUSCULAR | Status: AC | PRN
  Administered 2012-06-12: 25 ug via INTRAVENOUS

## 2012-06-12 NOTE — Progress Notes (Signed)
[facility] Progress Note [place] TEAM 1 - Stepdown/ICU TEAM   MIGUEL CHRISTIANA QAS:[id number] DOB: Oct 02, 1934 DOA: 06/11/2012 PCP: [name], MD  Brief narrative: 77 year old male patient with primary underlying medical problems of coronary artery disease and seizure disorder. He is previously been treated for polyneuropathy of unknown etiology in May of this year. At that time he received IVIG but later developed a rash therefore this medication was discontinued. He once again presented to the hospital last week for increasing numbness in the right upper extremity. Evaluation at that time including MRI revealed neural encroachment at the C4-C5 and C6-C7 levels. He was discharged on 06/08/2012. He was evaluated by neurology during that admission and plans were to follow up in neurology after discharge. At time of discharge no new treatment modalities were initiated. After presentation to the emergency department this time the neurologist Dr. 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 According to the neurologist notes the patient had recently been evaluated by a nurse specialist at [facility] who offered the patient the choice of plasma exchange, IVIG her steroids. Unfortunately the patient had not yet had the chance to return back to this physician before having worsening of his symptoms. He did report to the neurologist that several weeks ago he had received multiple vaccinations and subsequently noticed worsening of his symptoms initially in the right leg and right arm but then also affecting the left leg. The symptoms have steadily been ascending and patient did not been able to function at home. He also endorsed constipation. Because of his significant neurological symptoms he was admitted to the step down unit for further evaluation and treatment. In addition to a neurologist was recommending plasma exchange and has  requested for interventional radiology to place a pheresis catheter on 06/12/2012.  Assessment/Plan:  Chronic inflammatory demyelinating polyneuropathy *Appreciate neurology assistance * as of today in addition to the previously mentioned symptoms this patient is also complaining of sacral weakness that he describes as "feeling like there is a balloon on his bottom" * interventional radiology to place a phoresis catheter and plans are to proceed with plasmapheresis today which is the primary treatment for this condition *Unclear if concomitant use of steroids is indicated  Acute urinary retention/Neurogenic bladder *patient had developed acute urinary retention after arrival and has required in and out catheterization times one * on second episode of retention a Foley catheter was placed and 1 L of urine was obtained so the catheter was left in place *Suspect these symptoms are evidence of the ascending nature of CIDP  Coronary artery disease *asymptomatic and EKG without ischemic change  Hyperlipidemia *Continue statin-no evidence of myopathy or statin induced rhabdomyolysis  Seizure disorder *Continue phenobarbital and Dilantin  DVT prophylaxis:  SCDs Code Status: full Family Communication: spoke directly with patient and his wife Disposition Plan: remain in step down  Consultants: Neurology Interventional radiology  Procedures: Placement of a right IJ approach tunneled HemoSplit HD catheter for the purpose of plasmapheresis interventional radiology  Antibiotics: none  HPI/Subjective: Patient is alert and currently denies chest pain or shortness of breath. States extremity weakness has remained stable and has not worsened. Does complain now of the unusual sensation around his sacrum especially noted when he attempts to stand.   Objective: Blood pressure 149/64, pulse 70, temperature 97.6 F (36.4 C), temperature source Oral, resp. rate 20, height 6' (1.829 m), weight  89.7 kg (197 lb 12 oz), SpO2  99.00%.  Intake/Output Summary (Last 24 hours) at 06/12/12 1404 Last data filed at 06/12/12 1111  Gross per 24 hour  Intake    120 ml  Output   1175 ml  Net  -1055 ml     Exam: General: No acute respiratory distress Lungs: Clear to auscultation bilaterally without wheezes or crackles Cardiovascular: Regular rate and rhythm without murmur gallop or rub normal S1 and S2, no extremity edema Abdomen: Nontender, nondistended, soft, bowel sounds positive, no rebound, no ascites, no appreciable mass Musculoskeletal: No significant cyanosis, clubbing of extremities Neurological: bilateral lower extremity weakness is minimal at 4/5, right upper extremity weakness is negligible when tested extremities are 5/5 and patient seems to be preferential to using the left upper extremity, no other focal neurological deficits appreciated, otherwise patient is alert and oriented x3  Data Reviewed: Basic Metabolic Panel:  Lab 59/09/31 1518 06/08/12 0535 06/07/12 0814  NA 139 142 139  K 4.1 4.1 4.4  CL 103 106 103  CO2 $Re'26 28 29  'xIN$ GLUCOSE 111* 90 102*  BUN $Re'21 16 18  'BVx$ CREATININE 0.76 0.76 0.82  CALCIUM 9.1 9.0 9.7  MG -- -- --  PHOS -- -- --   Liver Function Tests:  Lab 06/11/12 1518 06/08/12 0535  AST 24 25  ALT 29 25  ALKPHOS 102 93  BILITOT 0.2* 0.4  PROT 6.7 6.2  ALBUMIN 3.6 3.4*   CBC:  Lab 06/11/12 1518 06/08/12 0535 06/07/12 0814  WBC 4.2 4.9 3.8*  NEUTROABS 2.4 2.6 1.9  HGB 14.3 13.4 15.5  HCT 40.5 39.0 45.0  MCV 92.9 93.5 95.1  PLT 162 163 158   CBG:  Lab 06/07/12 0901  GLUCAP 101*    Recent Results (from the past 240 hour(s))  MRSA PCR SCREENING     Status: Normal   Collection Time   06/11/12  9:21 PM      Component Value Range Status Comment   MRSA by PCR NEGATIVE  NEGATIVE Final      Studies:  Recent x-ray studies have been reviewed in detail by the Attending Physician  Scheduled Meds:  Reviewed in detail by the Attending  Physician   Erin Hearing, ANP [facility] Office  [phone] Pager [phone]  On-Call/Text Page:      [email or web address]      password [email or web address]  If 7PM-7AM, please contact night-coverage [email or web address] Password [email or web address] 06/12/2012, 2:04 PM   LOS: 1 day   I have personally examined this patient and reviewed the entire database. I have reviewed the above note, made any necessary editorial changes, and agree with its content.  Cherene Altes, MD [facility]

## 2012-06-12 NOTE — Progress Notes (Signed)
Subjective: No new complaints. No change in lower extremity weakness and numbness no numbness involving right upper extremity. No acute events overnight.  Objective: Current vital signs: BP 149/64  Pulse 70  Temp 97.6 F (36.4 C) (Oral)  Resp 20  Ht 6' (1.829 m)  Wt 89.7 kg (197 lb 12 oz)  BMI 26.82 kg/m2  SpO2 99%  Neurologic Exam: Alert and in no acute distress. Mental status was normal. Speech was normal and face was symmetrical. Strength of upper extremities was normal proximally. Hand grip strength was normal bilaterally. Patient had moderate hip flexor weakness bilaterally and moderate to severe weakness of dorsiflexors of his feet bilaterally, right greater than left.  Lab Results: Results for orders placed during the hospital encounter of 06/11/12 (from the past 48 hour(s))  CBC WITH DIFFERENTIAL     Status: Normal   Collection Time   06/11/12  3:18 PM      Component Value Range Comment   WBC 4.2  4.0 - 10.5 K/uL    RBC 4.36  4.22 - 5.81 MIL/uL    Hemoglobin 14.3  13.0 - 17.0 g/dL    HCT 40.5  39.0 - 52.0 %    MCV 92.9  78.0 - 100.0 fL    MCH 32.8  26.0 - 34.0 pg    MCHC 35.3  30.0 - 36.0 g/dL    RDW 12.4  11.5 - 15.5 %    Platelets 162  150 - 400 K/uL    Neutrophils Relative 57  43 - 77 %    Neutro Abs 2.4  1.7 - 7.7 K/uL    Lymphocytes Relative 34  12 - 46 %    Lymphs Abs 1.4  0.7 - 4.0 K/uL    Monocytes Relative 5  3 - 12 %    Monocytes Absolute 0.2  0.1 - 1.0 K/uL    Eosinophils Relative 4  0 - 5 %    Eosinophils Absolute 0.2  0.0 - 0.7 K/uL    Basophils Relative 1  0 - 1 %    Basophils Absolute 0.0  0.0 - 0.1 K/uL   COMPREHENSIVE METABOLIC PANEL     Status: Abnormal   Collection Time   06/11/12  3:18 PM      Component Value Range Comment   Sodium 139  135 - 145 mEq/L    Potassium 4.1  3.5 - 5.1 mEq/L    Chloride 103  96 - 112 mEq/L    CO2 26  19 - 32 mEq/L    Glucose, Bld 111 (*) 70 - 99 mg/dL    BUN 21  6 - 23 mg/dL    Creatinine, Ser 0.76  0.50 -  1.35 mg/dL    Calcium 9.1  8.4 - 10.5 mg/dL    Total Protein 6.7  6.0 - 8.3 g/dL    Albumin 3.6  3.5 - 5.2 g/dL    AST 24  0 - 37 U/L    ALT 29  0 - 53 U/L    Alkaline Phosphatase 102  39 - 117 U/L    Total Bilirubin 0.2 (*) 0.3 - 1.2 mg/dL    GFR calc non Af Amer 88 (*) >90 mL/min    GFR calc Af Amer >90  >90 mL/min   MRSA PCR SCREENING     Status: Normal   Collection Time   06/11/12  9:21 PM      Component Value Range Comment   MRSA by PCR NEGATIVE  NEGATIVE  Studies/Results: Ir Fluoro Guide Cv Line Right  06/12/2012  *RADIOLOGY REPORT*  IR INSERTION OF A TUNNELED HEMODIALYSIS (PERMCATHETER) DIALYSIS ACCESS CATHETER WITH ULTRASOUND AND FLUOROSCOPIC GUIDANCE  Date: 06/12/2012  Clinical History: 77 year old male with myasthenia gravis/autoimmune neuropathy.  He requires durable central venous access for plasmapheresis.  Procedures Performed: 1. Ultrasound-guided puncture of the right internal jugular vein 2.  Fluoroscopic guidance for placement of a tunneled hemodialysis catheter  Interventional Radiologist:  Criselda Peaches, MD  Sedation: Moderate (conscious) sedation was used.  1 mg Versed, 25 mcg Fentanyl were administered intravenously.  The patient's vital signs were monitored continuously by radiology nursing throughout the procedure.  Sedation Time: 18 minutes  Fluoroscopy time: 0.6 minutes  Contrast volume: None  PROCEDURE/FINDINGS:   Informed consent was obtained from the patient following explanation of the procedure, risks, benefits and alternatives. The patient understands, agrees and consents for the procedure. All questions were addressed. A time out was performed.  Maximal barrier sterile technique utilized including caps, mask, sterile gowns, sterile gloves, large sterile drape, hand hygiene, and chlorhexidine skin prep.  The right neck was interrogated with ultrasound.  The right internal jugular vein was identified and found to be widely patent. Local anesthesia was  achieved with infiltration of 1% lidocaine. Under direct sonographic guidance, the right internal jugular vein was punctured with a 21-gauge micropuncture needle.  An image was obtained stored electronic medical record.  The 5-French transitional dilator was then advanced over a 0.018- inch wire.  A 23 cm tip to cuff HemoSplit tunneled hemodialysis catheter was selected.  An appropriate skin exit site was selected inferior to the clavicle.  Local anesthesia was again achieved with infiltration of 1% lidocaine.  A small dermatotomy was made at the skin exit site.  The 23 cm HemoSplit catheter was then tunneled through the subcutaneous soft tissues from the exit site, to the venous entry site.  The 5-French sheath was then exchanged over a J wire for a peel-away sheath.  The HemoSplit catheter was then advanced through the peel-away sheath and the catheter tip positioned in the upper right atrium.  The catheter flushed and aspirated with ease.  It was secured to the skin with 0 Prolene suture.  The dermatotomy overlying the venous access site was closed with a single, interrupted and inverted 3-0 Vicryl suture.  The epidermis was sealed with Dermabond.  The catheter was flushed and locked with heparinized saline (1000 unit/ml).  There was no immediate complication.  The patient tolerated the procedure well and was returned to his room in stable condition.  IMPRESSION:  Successful placement of a right IJ tunneled hemodialysis catheter for the purposes of plasmapheresis.  The catheter tip is positioned in the upper right atrium and is ready for immediate use.  Signed,  Criselda Peaches, MD Vascular & Interventional Radiologist [facility]   Original Report Authenticated By: Myrle Sheng    Ir US Guide Vasc Access Right  06/12/2012  *RADIOLOGY REPORT*  IR INSERTION OF A TUNNELED HEMODIALYSIS (PERMCATHETER) DIALYSIS ACCESS CATHETER WITH ULTRASOUND AND FLUOROSCOPIC GUIDANCE  Date: 06/12/2012  Clinical History:  76 year old male with myasthenia gravis/autoimmune neuropathy.  He requires durable central venous access for plasmapheresis.  Procedures Performed: 1. Ultrasound-guided puncture of the right internal jugular vein 2.  Fluoroscopic guidance for placement of a tunneled hemodialysis catheter  Interventional Radiologist:  Criselda Peaches, MD  Sedation: Moderate (conscious) sedation was used.  1 mg Versed, 25 mcg Fentanyl were administered intravenously.  The patient's vital signs were  monitored continuously by radiology nursing throughout the procedure.  Sedation Time: 18 minutes  Fluoroscopy time: 0.6 minutes  Contrast volume: None  PROCEDURE/FINDINGS:   Informed consent was obtained from the patient following explanation of the procedure, risks, benefits and alternatives. The patient understands, agrees and consents for the procedure. All questions were addressed. A time out was performed.  Maximal barrier sterile technique utilized including caps, mask, sterile gowns, sterile gloves, large sterile drape, hand hygiene, and chlorhexidine skin prep.  The right neck was interrogated with ultrasound.  The right internal jugular vein was identified and found to be widely patent. Local anesthesia was achieved with infiltration of 1% lidocaine. Under direct sonographic guidance, the right internal jugular vein was punctured with a 21-gauge micropuncture needle.  An image was obtained stored electronic medical record.  The 5-French transitional dilator was then advanced over a 0.018- inch wire.  A 23 cm tip to cuff HemoSplit tunneled hemodialysis catheter was selected.  An appropriate skin exit site was selected inferior to the clavicle.  Local anesthesia was again achieved with infiltration of 1% lidocaine.  A small dermatotomy was made at the skin exit site.  The 23 cm HemoSplit catheter was then tunneled through the subcutaneous soft tissues from the exit site, to the venous entry site.  The 5-French sheath was then  exchanged over a J wire for a peel-away sheath.  The HemoSplit catheter was then advanced through the peel-away sheath and the catheter tip positioned in the upper right atrium.  The catheter flushed and aspirated with ease.  It was secured to the skin with 0 Prolene suture.  The dermatotomy overlying the venous access site was closed with a single, interrupted and inverted 3-0 Vicryl suture.  The epidermis was sealed with Dermabond.  The catheter was flushed and locked with heparinized saline (1000 unit/ml).  There was no immediate complication.  The patient tolerated the procedure well and was returned to his room in stable condition.  IMPRESSION:  Successful placement of a right IJ tunneled hemodialysis catheter for the purposes of plasmapheresis.  The catheter tip is positioned in the upper right atrium and is ready for immediate use.  Signed,  Criselda Peaches, MD Vascular & Interventional Radiologist [facility]   Original Report Authenticated By: Myrle Sheng     Medications:  Scheduled:   . aspirin EC  81 mg Oral Daily  . atorvastatin  20 mg Oral QHS  . calcium-vitamin D  1 tablet Oral Q breakfast  . citrate dextrose      . folic acid  1 mg Oral Daily  . heparin  1,000 Units Intracatheter Once  . PHENobarbital  97.2 mg Oral QHS  . phenytoin  100 mg Oral QHS  . polyethylene glycol  17 g Oral Daily  . sodium chloride  3 mL Intravenous Q12H  . sodium chloride  3 mL Intravenous Q12H  . vancomycin  1,000 mg Intravenous Once  . DISCONTD: sodium chloride   Intravenous STAT  . DISCONTD: aspirin  81 mg Oral Daily  . DISCONTD:  ceFAZolin (ANCEF) IV  2 g Intravenous Once  . 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  Patient is being scheduled for plasmapheresis, every other day for total of 5 treatments. Physical therapy intervention  is to continue as well. Hemodialysis catheter has been ordered for plasmapheresis access.  C.R. Nicole Kindred, MD [facility] [phone]  06/12/2012  2:47 PM

## 2012-06-12 NOTE — Procedures (Signed)
Interventional Radiology Procedure Note  Procedure: Placement of a right IJ approach tunneled HemoSplit HD catheter for the purpose of plasmapheresis.  Catheter tip positioned in the upper right artium and ready for immediate use.  Complications: None.  Recommendations: - May begin PLEX when needed - Routine line care - Retaining suture may be removed in 14 days - Return to IR for surgical removal when catheter no longer needed  Signed,  Criselda Peaches, MD Vascular & Interventional Radiologist [facility]

## 2012-06-12 NOTE — Interval H&P Note (Cosign Needed)
History and Physical Interval Note:  06/12/2012 9:59 AM  Brandon Ray  has been admitted with autoimmune neuropathy.  The various methods of treatment have been discussed with the patient and family and after consideration of risks, benefits and other options for treatment, the patient has agreed to plasmapheresis as treatment. IR is requested to place pheresis catheter. The patient's history has been reviewed, patient examined, no change in status, stable for surgery.  I have reviewed the patient's chart and labs.  Questions were answered to the patient's satisfaction.  Consent signed in chart. BP 115/66  Pulse 90  Temp 97.9 F (36.6 C) (Oral)  Resp 20  Ht 6' (1.829 m)  Wt 197 lb 12 oz (89.7 kg)  BMI 26.82 kg/m2  SpO2 96% Neck: no scars or defects, trachea midline Lungs: CTA Heart: Reg  Results for orders placed during the hospital encounter of 06/11/12  CBC WITH DIFFERENTIAL      Component Value Range   WBC 4.2  4.0 - 10.5 K/uL   RBC 4.36  4.22 - 5.81 MIL/uL   Hemoglobin 14.3  13.0 - 17.0 g/dL   HCT 40.5  39.0 - 52.0 %   MCV 92.9  78.0 - 100.0 fL   MCH 32.8  26.0 - 34.0 pg   MCHC 35.3  30.0 - 36.0 g/dL   RDW 12.4  11.5 - 15.5 %   Platelets 162  150 - 400 K/uL   Neutrophils Relative 57  43 - 77 %   Neutro Abs 2.4  1.7 - 7.7 K/uL   Lymphocytes Relative 34  12 - 46 %   Lymphs Abs 1.4  0.7 - 4.0 K/uL   Monocytes Relative 5  3 - 12 %   Monocytes Absolute 0.2  0.1 - 1.0 K/uL   Eosinophils Relative 4  0 - 5 %   Eosinophils Absolute 0.2  0.0 - 0.7 K/uL   Basophils Relative 1  0 - 1 %   Basophils Absolute 0.0  0.0 - 0.1 K/uL  COMPREHENSIVE METABOLIC PANEL      Component Value Range   Sodium 139  135 - 145 mEq/L   Potassium 4.1  3.5 - 5.1 mEq/L   Chloride 103  96 - 112 mEq/L   CO2 26  19 - 32 mEq/L   Glucose, Bld 111 (*) 70 - 99 mg/dL   BUN 21  6 - 23 mg/dL   Creatinine, Ser 0.76  0.50 - 1.35 mg/dL   Calcium 9.1  8.4 - 10.5 mg/dL   Total Protein 6.7  6.0 - 8.3 g/dL   Albumin 3.6  3.5 - 5.2 g/dL   AST 24  0 - 37 U/L   ALT 29  0 - 53 U/L   Alkaline Phosphatase 102  39 - 117 U/L   Total Bilirubin 0.2 (*) 0.3 - 1.2 mg/dL   GFR calc non Af Amer 88 (*) >90 mL/min   GFR calc Af Amer >90  >90 mL/min  MRSA PCR SCREENING      Component Value Range   MRSA by PCR NEGATIVE  NEGATIVE     Seen for Dr. Marlowe Sax, [facility] PA-C

## 2012-06-12 NOTE — Progress Notes (Signed)
Utilization review completed.  

## 2012-06-13 DIAGNOSIS — I2510 Atherosclerotic heart disease of native coronary artery without angina pectoris: Secondary | ICD-10-CM

## 2012-06-13 DIAGNOSIS — R209 Unspecified disturbances of skin sensation: Secondary | ICD-10-CM

## 2012-06-13 MED ORDER — CALCIUM GLUCONATE 10 % IV SOLN
2.0000 g | INTRAVENOUS | Status: DC | PRN
  Administered 2012-06-18: 2 g via INTRAVENOUS
  Filled 2012-06-13 (×3): qty 20

## 2012-06-13 MED ORDER — HEPARIN SODIUM (PORCINE) 1000 UNIT/ML IJ SOLN
1000.0000 [IU] | Freq: Once | INTRAMUSCULAR | Status: DC
  Filled 2012-06-13 (×2): qty 1

## 2012-06-13 MED ORDER — ACD FORMULA A 0.73-2.45-2.2 GM/100ML VI SOLN: 500.0000 mL | Status: DC

## 2012-06-13 MED ORDER — SODIUM CHLORIDE 0.9 % IV SOLN
Freq: Once | INTRAVENOUS | Status: AC
  Administered 2012-06-14: 07:00:00 via INTRAVENOUS_CENTRAL
  Filled 2012-06-13: qty 200

## 2012-06-13 MED ORDER — CALCIUM CARBONATE ANTACID 500 MG PO CHEW
2.0000 | CHEWABLE_TABLET | ORAL | Status: AC
  Administered 2012-06-13 (×2): 400 mg via ORAL
  Filled 2012-06-13 (×2): qty 2

## 2012-06-13 MED ORDER — CALCIUM CARBONATE ANTACID 500 MG PO CHEW
2.0000 | CHEWABLE_TABLET | ORAL | Status: DC | PRN
  Administered 2012-06-14: 400 mg via ORAL
  Filled 2012-06-13: qty 2

## 2012-06-13 MED ORDER — DIPHENHYDRAMINE HCL 25 MG PO CAPS: 25.0000 mg | ORAL_CAPSULE | Freq: Four times a day (QID) | ORAL | Status: DC | PRN

## 2012-06-13 MED ORDER — CALCIUM GLUCONATE 10 % IV SOLN: 2.0000 g | INTRAVENOUS | Status: DC | PRN

## 2012-06-13 MED ORDER — SODIUM CHLORIDE 0.9 % IV SOLN: 4.0000 g | INTRAVENOUS | Status: DC | PRN

## 2012-06-13 MED ORDER — ACETAMINOPHEN 325 MG PO TABS: 650.0000 mg | ORAL_TABLET | ORAL | Status: DC | PRN

## 2012-06-13 MED ORDER — POLYETHYLENE GLYCOL 3350 17 G PO PACK
17.0000 g | PACK | Freq: Every day | ORAL | Status: DC | PRN
  Administered 2012-06-13 – 2012-06-16 (×3): 17 g via ORAL
  Filled 2012-06-13 (×4): qty 1

## 2012-06-13 MED FILL — Midazolam HCl Inj 2 MG/2ML (Base Equivalent): INTRAMUSCULAR | Qty: 2 | Status: AC

## 2012-06-13 MED FILL — Fentanyl Citrate Inj 0.05 MG/ML: INTRAMUSCULAR | Qty: 2 | Status: AC

## 2012-06-13 MED FILL — Heparin Sodium (Porcine) Inj 1000 Unit/ML: INTRAMUSCULAR | Qty: 10 | Status: AC

## 2012-06-13 NOTE — Progress Notes (Signed)
Subjective: Patient other complaints. He's no longer having pain involving his right upper extremity. Tingling is less pronounced than right upper extremity as well.Marland Kitchen Has not experienced any changes in sensation involving lower extremities. He received his first of 5 courses of plasmapheresis yesterday and tolerated the procedure well. Objective: Current vital signs: BP 124/51  Pulse 76  Temp 97.4 F (36.3 C) (Oral)  Resp 13  Ht 6' (1.829 m)  Wt 85.5 kg (188 lb 7.9 oz)  BMI 25.56 kg/m2  SpO2 93%  Neurologic Exam: Alert and in no acute distress. Mental status was normal. Strength of upper extremities was normal proximally and distally. Hip flexor strength on the right was 4/5, and 4 minus over 5 on the left. Moderately severe weakness of dorsiflexors of the feet, right greater than left, is unchanged.  Medications:  Scheduled:   . aspirin EC  81 mg Oral Daily  . atorvastatin  20 mg Oral QHS  . calcium-vitamin D  1 tablet Oral Q breakfast  . folic acid  1 mg Oral Daily  . heparin  1,000 Units Intracatheter Once  . PHENobarbital  97.2 mg Oral QHS  . phenytoin  100 mg Oral QHS  . polyethylene glycol  17 g Oral Daily  . sodium chloride 0.9 % 500 mL with albumin human 25 g infusion   Intravenous Once  . sodium chloride 0.9 % 800 mL with albumin human 50 g infusion   Intravenous Q30 min  . sodium chloride  3 mL Intravenous Q12H  . sodium chloride  3 mL Intravenous Q12H  . vancomycin  1,000 mg Intravenous Once  . DISCONTD: calcium carbonate  2 tablet Oral Q3H  . DISCONTD:  ceFAZolin (ANCEF) IV  2 g Intravenous Once  . 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  We will continue with this course of plasmapheresis is planned. No changes in current management participated.  C.R. Nicole Kindred, MD [facility] [phone]  06/13/2012  10:27 AM

## 2012-06-13 NOTE — Progress Notes (Signed)
[facility] Progress Note [place] TEAM 1 - Stepdown/ICU TEAM   Brandon Ray BLT:[id number] DOB: 02/24/1935 DOA: 06/11/2012 PCP: [name], MD  Brief narrative: 77 year old male patient with primary underlying medical problems of coronary artery disease and seizure disorder. He is previously been treated for polyneuropathy of unknown etiology in May of this year. At that time he received IVIG but later developed a rash therefore this medication was discontinued. He once again presented to the hospital last week for increasing numbness in the right upper extremity. Evaluation at that time including MRI revealed neural encroachment at the C4-C5 and C6-C7 levels. He was discharged on 06/08/2012. He was evaluated by neurology during that admission and plans were to follow up in neurology after discharge. At time of discharge no new treatment modalities were initiated. After presentation to the emergency department this time the neurologist Dr. 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 According to the neurologist notes the patient had recently been evaluated by a nurse specialist at [facility] who offered the patient the choice of plasma exchange, IVIG her steroids. Unfortunately the patient had not yet had the chance to return back to this physician before having worsening of his symptoms. He did report to the neurologist that several weeks ago he had received multiple vaccinations and subsequently noticed worsening of his symptoms initially in the right leg and right arm but then also affecting the left leg. The symptoms have steadily been ascending and patient did not been able to function at home. He also endorsed constipation. Because of his significant neurological symptoms he was admitted to the step down unit for further evaluation and treatment. In addition to a neurologist was recommending plasma exchange and has  requested for interventional radiology to place a pheresis catheter on 06/12/2012.  Assessment/Plan:  Chronic inflammatory demyelinating polyneuropathy *Appreciate neurology assistance * s/p plasmapheresis yesterday with some improvement noted- will have repeat pheresis tomorrow  Acute urinary retention/Neurogenic bladder *patient had developed acute urinary retention after arrival and has required in and out catheterization times one * on second episode of retention, a Foley catheter was placed and 1 L of urine was obtained, therefore, the catheter was left in place *Suspect these symptoms are evidence of the ascending nature of CIDP  Coronary artery disease *asymptomatic and EKG without ischemic change  Hyperlipidemia *Continue statin-no evidence of myopathy or statin induced rhabdomyolysis  Seizure disorder *Continue phenobarbital and Dilantin  DVT prophylaxis:  SCDs Code Status: full Family Communication: spoke directly with patient  Disposition Plan: remain in step down  Consultants: Neurology Interventional radiology  Procedures: Placement of a right IJ approach tunneled HemoSplit HD catheter for the purpose of plasmapheresis interventional radiology  Antibiotics: none  HPI/Subjective: Patient is alert and currently denies chest pain or shortness of breath. States extremity weakness has remained stable and has not worsened. Does complain now of the unusual sensation around his sacrum especially noted when he attempts to stand.   Objective: Blood pressure 124/51, pulse 76, temperature 98.1 F (36.7 C), temperature source Oral, resp. rate 13, height 6' (1.829 m), weight 85.5 kg (188 lb 7.9 oz), SpO2 93.00%.  Intake/Output Summary (Last 24 hours) at 06/13/12 1646 Last data filed at 06/13/12 1530  Gross per 24 hour  Intake      0 ml  Output   1300 ml  Net  -1300 ml     Exam: General: No acute respiratory distress Lungs: Clear to  auscultation bilaterally  without wheezes or crackles Cardiovascular: Regular rate and rhythm without murmur gallop or rub normal S1 and S2, no extremity edema Abdomen: Nontender, nondistended, soft, bowel sounds positive, no rebound, no ascites, no appreciable mass Musculoskeletal: No significant cyanosis, clubbing of extremities Neurological: bilateral lower extremity weakness is minimal at 4/5, right upper extremity weakness is negligible when tested extremities are 5/5 and patient seems to be preferential to using the left upper extremity, no other focal neurological deficits appreciated, otherwise patient is alert and oriented x3  Data Reviewed: Basic Metabolic Panel:  Lab 84/73/08 1434 06/11/12 1518 06/08/12 0535 06/07/12 0814  NA 140 139 142 139  K 4.3 4.1 4.1 4.4  CL 102 103 106 103  CO2 $Re'30 26 28 29  'QAW$ GLUCOSE 101* 111* 90 102*  BUN $Re'16 21 16 18  'ewZ$ CREATININE 0.81 0.76 0.76 0.82  CALCIUM 9.5 9.1 9.0 9.7  MG -- -- -- --  PHOS -- -- -- --   Liver Function Tests:  Lab 06/12/12 1434 06/11/12 1518 06/08/12 0535  AST $Re'22 24 25  'OeG$ ALT $R'26 29 25  'bB$ ALKPHOS 98 102 93  BILITOT 0.4 0.2* 0.4  PROT 6.6 6.7 6.2  ALBUMIN 3.6 3.6 3.4*   CBC:  Lab 06/12/12 1434 06/11/12 1518 06/08/12 0535 06/07/12 0814  WBC 5.3 4.2 4.9 3.8*  NEUTROABS -- 2.4 2.6 1.9  HGB 14.4 14.3 13.4 15.5  HCT 41.8 40.5 39.0 45.0  MCV 94.1 92.9 93.5 95.1  PLT 165 162 163 158   CBG:  Lab 06/07/12 0901  GLUCAP 101*    Recent Results (from the past 240 hour(s))  MRSA PCR SCREENING     Status: Normal   Collection Time   06/11/12  9:21 PM      Component Value Range Status Comment   MRSA by PCR NEGATIVE  NEGATIVE Final      Studies:  Recent x-ray studies have been reviewed in detail by the Attending Physician  Scheduled Meds:  Reviewed in detail by the Attending Physician   Debbe Odea, MD [phone]  If 7PM-7AM, please contact night-coverage [email or web address] Password [facility] 06/13/2012, 4:46 PM   LOS: 2 days

## 2012-06-14 ENCOUNTER — Inpatient Hospital Stay (HOSPITAL_COMMUNITY): Payer: Medicare Other

## 2012-06-14 DIAGNOSIS — N319 Neuromuscular dysfunction of bladder, unspecified: Secondary | ICD-10-CM

## 2012-06-14 MED ORDER — SODIUM CHLORIDE 0.9 % IV SOLN
Freq: Once | INTRAVENOUS | Status: AC
  Administered 2012-06-14: 09:00:00 via INTRAVENOUS_CENTRAL
  Filled 2012-06-14: qty 200

## 2012-06-14 MED ORDER — ACD FORMULA A 0.73-2.45-2.2 GM/100ML VI SOLN
Status: AC
  Filled 2012-06-14: qty 500

## 2012-06-14 MED ORDER — SODIUM CHLORIDE 0.9 % IV SOLN
Freq: Once | INTRAVENOUS | Status: AC
  Administered 2012-06-14: 08:00:00 via INTRAVENOUS_CENTRAL
  Filled 2012-06-14: qty 200

## 2012-06-14 MED ORDER — CALCIUM CARBONATE ANTACID 500 MG PO CHEW
CHEWABLE_TABLET | ORAL | Status: AC
  Filled 2012-06-14: qty 1

## 2012-06-14 NOTE — Progress Notes (Signed)
[facility] PRO[facility]ESS NOTE    SUBJECTIVE   States his numbness in his hand has resolved but the weakness in his LE remains. No adverse affects to the PLX. He has received 2 doses.   OBJECTIVE   Vital signs in last 24 hours: Temp:  [96.7 F (35.9 C)-98.1 F (36.7 C)] 97 F (36.1 C) (09/25 0906) Pulse Rate:  [72-81] 74  (09/25 0906) Resp:  [12-23] 12  (09/25 0906) BP: (103-129)/(44-62) 108/50 mmHg (09/25 0906) SpO2:  [92 %-100 %] 98 % (09/25 0906) Weight:  [84.4 kg (186 lb 1.1 oz)-85.2 kg (187 lb 13.3 oz)] 84.4 kg (186 lb 1.1 oz) (09/25 0700)  Intake/Output from previous day: 09/24 0701 - 09/25 0700 In: 360 [P.O.:360] Out: 2450 [Urine:2450] Intake/Output this shift:   Nutritional status: General  Past Medical History  Diagnosis Date  . Coronary artery disease   . Hyperlipidemia   . Seizure disorder   . Seizures   . Heart attack   . Fever   . Weakness   . Leg swelling     both legs  . Abdominal distension   . Abdominal pain   . Constipation   . Bruises easily   . Pleural effusion   . Small bowel obstruction   . Peripheral neuropathy     Neurologic ROS negative with exception of above . Musculoskeletal ROS negative  Neurologic Exam:   Mental Status: Alert, oriented, thought content appropriate.  Speech fluent without evidence of aphasia.  Able to follow 3 step commands without difficulty. Cranial Nerves: II: Discs flat bilaterally; Visual fields grossly normal, pupils equal, round, reactive to light and accommodation III,IV, VI: ptosis not present, extra-ocular motions intact bilaterally V,VII: smile symmetric, facial light touch sensation normal bilaterally VIII: hearing normal bilaterally IX,X: gag reflex present XI: bilateral shoulder shrug XII: midline tongue extension Motor: Right : Upper extremity   5/5    Left:     Upper extremity   5/5  Lower extremity   4/5     Lower extremity   4/5 Tone and  bulk:normal tone throughout; no atrophy noted Sensory: Pinprick and light touch intact throughout, bilaterally Deep Tendon Reflexes: 2+ UE bilaterally, no KJ or AJ.  Plantars: Right: mute  Left: mute Cerebellar: normal finger-to-nose,  normal heel-to-shin test CV: pulses palpable throughout    Lab Results: No results found for this basename: cbc, bmp, coags, chol, tri, ldl, hga1c   Lipid Panel No results found for this basename: CHOL,TRIG,HDL,CHOLHDL,VLDL,LDLCALC in the last 72 hours  Studies/Results: Ir Fluoro Guide Cv Line Right  06/12/2012  *RADIOLOGY REPORT*  IR INSERTION OF A TUNNELED HEMODIALYSIS (PERMCATHETER) DIALYSIS ACCESS CATHETER WITH ULTRASOUND AND FLUOROSCOPIC GUIDANCE  Date: 06/12/2012  Clinical History: 77 year old male with myasthenia gravis/autoimmune neuropathy.  He requires durable central venous access for plasmapheresis.  Procedures Performed: 1. Ultrasound-guided puncture of the right internal jugular vein 2.  Fluoroscopic guidance for placement of a tunneled hemodialysis catheter  Interventional Radiologist:  Criselda Peaches, MD  Sedation: Moderate (conscious) sedation was used.  1 mg Versed, 25 mcg Fentanyl were administered intravenously.  The patient's vital signs were monitored continuously by radiology nursing throughout the procedure.  Sedation Time: 18 minutes  Fluoroscopy time: 0.6 minutes  Contrast volume: None  PROCEDURE/FINDINGS:   Informed consent  was obtained from the patient following explanation of the procedure, risks, benefits and alternatives. The patient understands, agrees and consents for the procedure. All questions were addressed. A time out was performed.  Maximal barrier sterile technique utilized including caps, mask, sterile gowns, sterile gloves, large sterile drape, hand hygiene, and chlorhexidine skin prep.  The right neck was interrogated with ultrasound.  The right internal jugular vein was identified and found to be widely patent. Local  anesthesia was achieved with infiltration of 1% lidocaine. Under direct sonographic guidance, the right internal jugular vein was punctured with a 21-gauge micropuncture needle.  An image was obtained stored electronic medical record.  The 5-French transitional dilator was then advanced over a 0.018- inch wire.  A 23 cm tip to cuff HemoSplit tunneled hemodialysis catheter was selected.  An appropriate skin exit site was selected inferior to the clavicle.  Local anesthesia was again achieved with infiltration of 1% lidocaine.  A small dermatotomy was made at the skin exit site.  The 23 cm HemoSplit catheter was then tunneled through the subcutaneous soft tissues from the exit site, to the venous entry site.  The 5-French sheath was then exchanged over a J wire for a peel-away sheath.  The HemoSplit catheter was then advanced through the peel-away sheath and the catheter tip positioned in the upper right atrium.  The catheter flushed and aspirated with ease.  It was secured to the skin with 0 Prolene suture.  The dermatotomy overlying the venous access site was closed with a single, interrupted and inverted 3-0 Vicryl suture.  The epidermis was sealed with Dermabond.  The catheter was flushed and locked with heparinized saline (1000 unit/ml).  There was no immediate complication.  The patient tolerated the procedure well and was returned to his room in stable condition.  IMPRESSION:  Successful placement of a right IJ tunneled hemodialysis catheter for the purposes of plasmapheresis.  The catheter tip is positioned in the upper right atrium and is ready for immediate use.  Signed,  Criselda Peaches, MD Vascular & Interventional Radiologist [facility] Radiology   Original Report Authenticated By: Myrle Sheng    Ir US Guide Vasc Access Right  06/12/2012  *RADIOLOGY REPORT*  IR INSERTION OF A TUNNELED HEMODIALYSIS (PERMCATHETER) DIALYSIS ACCESS CATHETER WITH ULTRASOUND AND FLUOROSCOPIC GUIDANCE  Date: 06/12/2012   Clinical History: 77 year old male with myasthenia gravis/autoimmune neuropathy.  He requires durable central venous access for plasmapheresis.  Procedures Performed: 1. Ultrasound-guided puncture of the right internal jugular vein 2.  Fluoroscopic guidance for placement of a tunneled hemodialysis catheter  Interventional Radiologist:  Criselda Peaches, MD  Sedation: Moderate (conscious) sedation was used.  1 mg Versed, 25 mcg Fentanyl were administered intravenously.  The patient's vital signs were monitored continuously by radiology nursing throughout the procedure.  Sedation Time: 18 minutes  Fluoroscopy time: 0.6 minutes  Contrast volume: None  PROCEDURE/FINDINGS:   Informed consent was obtained from the patient following explanation of the procedure, risks, benefits and alternatives. The patient understands, agrees and consents for the procedure. All questions were addressed. A time out was performed.  Maximal barrier sterile technique utilized including caps, mask, sterile gowns, sterile gloves, large sterile drape, hand hygiene, and chlorhexidine skin prep.  The right neck was interrogated with ultrasound.  The right internal jugular vein was identified and found to be widely patent. Local anesthesia was achieved with infiltration of 1% lidocaine. Under direct sonographic guidance, the right internal jugular vein was punctured with a 21-gauge micropuncture needle.  An  image was obtained stored electronic medical record.  The 5-French transitional dilator was then advanced over a 0.018- inch wire.  A 23 cm tip to cuff HemoSplit tunneled hemodialysis catheter was selected.  An appropriate skin exit site was selected inferior to the clavicle.  Local anesthesia was again achieved with infiltration of 1% lidocaine.  A small dermatotomy was made at the skin exit site.  The 23 cm HemoSplit catheter was then tunneled through the subcutaneous soft tissues from the exit site, to the venous entry site.  The 5-French  sheath was then exchanged over a J wire for a peel-away sheath.  The HemoSplit catheter was then advanced through the peel-away sheath and the catheter tip positioned in the upper right atrium.  The catheter flushed and aspirated with ease.  It was secured to the skin with 0 Prolene suture.  The dermatotomy overlying the venous access site was closed with a single, interrupted and inverted 3-0 Vicryl suture.  The epidermis was sealed with Dermabond.  The catheter was flushed and locked with heparinized saline (1000 unit/ml).  There was no immediate complication.  The patient tolerated the procedure well and was returned to his room in stable condition.  IMPRESSION:  Successful placement of a right IJ tunneled hemodialysis catheter for the purposes of plasmapheresis.  The catheter tip is positioned in the upper right atrium and is ready for immediate use.  Signed,  Criselda Peaches, MD Vascular & Interventional Radiologist [facility] Radiology   Original Report Authenticated By: Myrle Sheng     Medications:     Scheduled:   . therapeutic plasma exchange solution   Dialysis Once in dialysis  . therapeutic plasma exchange solution   Dialysis Once in dialysis  . therapeutic plasma exchange solution   Dialysis Once in dialysis  . aspirin EC  81 mg Oral Daily  . atorvastatin  20 mg Oral QHS  . calcium carbonate  2 tablet Oral Q3H  . calcium-vitamin D  1 tablet Oral Q breakfast  . folic acid  1 mg Oral Daily  . heparin  1,000 Units Intracatheter Once  . PHENobarbital  97.2 mg Oral QHS  . phenytoin  100 mg Oral QHS  . polyethylene glycol  17 g Oral Daily  . sodium chloride 0.9 % 500 mL with albumin human 25 g infusion   Intravenous Once  . sodium chloride  3 mL Intravenous Q12H  . 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 He has received 2 doses PLX with continued improvement of right UE but no significant improvement of bilateral proximal weakness. He is scheduled  for 3 more PLX.  We will continue with this course of plasmapheresis is planned. No changes in current management participated  Etta Quill PA-C [facility] [phone]  06/14/2012, 10:52 AM

## 2012-06-14 NOTE — Progress Notes (Signed)
[facility] Progress Note [place] TEAM 1 - Stepdown/ICU TEAM   Brandon Ray ASN:[id number] DOB: 1935/07/15 DOA: 06/11/2012 PCP: [name], MD  Brief narrative: 77 year old male patient with primary underlying medical problems of coronary artery disease and seizure disorder. He is previously been treated for polyneuropathy of unknown etiology in May of this year. At that time he received IVIG but later developed a rash therefore this medication was discontinued. He once again presented to the hospital last week for increasing numbness in the right upper extremity. Evaluation at that time including MRI revealed neural encroachment at the C4-C5 and C6-C7 levels. He was discharged on 06/08/2012. He was evaluated by neurology during that admission and plans were to follow up in neurology after discharge. At time of discharge no new treatment modalities were initiated. After presentation to the emergency department this time the neurologist Dr. 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 According to the neurologist notes the patient had recently been evaluated by a nurse specialist at [facility] who offered the patient the choice of plasma exchange, IVIG her steroids. Unfortunately the patient had not yet had the chance to return back to this physician before having worsening of his symptoms. He did report to the neurologist that several weeks ago he had received multiple vaccinations and subsequently noticed worsening of his symptoms initially in the right leg and right arm but then also affecting the left leg. The symptoms have steadily been ascending and patient did not been able to function at home. He also endorsed constipation. Because of his significant neurological symptoms he was admitted to the step down unit for further evaluation and treatment. In addition to a neurologist was recommending plasma exchange and has  requested for interventional radiology to place a pheresis catheter on 06/12/2012.  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  Coronary artery disease *asymptomatic and EKG without ischemic change  Hyperlipidemia *no evidence of myopathy or statin induced rhabdomyolysis  Seizure disorder *Continue phenobarbital and Dilantin  DVT prophylaxis:  SCDs Code Status: full Family Communication: spoke directly with patient  Disposition Plan: transfer to neuro floor  Consultants: Neurology Interventional radiology  Procedures: Placement of a right IJ approach tunneled HemoSplit HD catheter for the purpose of plasmapheresis interventional radiology  Antibiotics: none  HPI/Subjective: Alert and endorses right arm pain has now resolved and has noticed decreased paresthesias and lower extremities but feels the strength is unchanged.   Objective: Blood pressure 108/50, pulse 74, temperature 97.6 F (36.4 C), temperature source Oral, resp. rate 12, height 6' (1.829 m), weight 84.4 kg (186 lb 1.1 oz), SpO2 98.00%.  Intake/Output Summary (Last 24 hours) at 06/14/12 1257 Last data filed at 06/14/12 1100  Gross per 24 hour  Intake    360 ml  Output   2775 ml  Net  -2415 ml     Exam: General: No acute respiratory distress Lungs: Clear to auscultation bilaterally without wheezes  or crackles  Cardiovascular: Regular rate and rhythm without murmur gallop or rub normal S1 and S2, no extremity edema Abdomen: Nontender, nondistended, soft, bowel sounds positive, no rebound, no ascites, no appreciable mass Musculoskeletal: No significant cyanosis, clubbing of extremities Neurological: bilateral lower extremity weakness is minimal at 4/5, right upper extremity weakness is negligible when tested extremities are 5/5 and patient seems to be preferential to using the left upper extremity, no other focal neurological deficits appreciated, otherwise patient is alert and oriented x3  Data Reviewed: Basic Metabolic Panel:  Lab 40/37/09 1434 06/11/12 1518 06/08/12 0535  NA 140 139 142  K 4.3 4.1 4.1  CL 102 103 106  CO2 $Re'30 26 28  'zaw$ GLUCOSE 101* 111* 90  BUN $Re'16 21 16  'szJ$ CREATININE 0.81 0.76 0.76  CALCIUM 9.5 9.1 9.0  MG -- -- --  PHOS -- -- --   Liver Function Tests:  Lab 06/12/12 1434 06/11/12 1518 06/08/12 0535  AST $Re'22 24 25  'jAI$ ALT $R'26 29 25  'Lm$ ALKPHOS 98 102 93  BILITOT 0.4 0.2* 0.4  PROT 6.6 6.7 6.2  ALBUMIN 3.6 3.6 3.4*   CBC:  Lab 06/12/12 1434 06/11/12 1518 06/08/12 0535  WBC 5.3 4.2 4.9  NEUTROABS -- 2.4 2.6  HGB 14.4 14.3 13.4  HCT 41.8 40.5 39.0  MCV 94.1 92.9 93.5  PLT 165 162 163    Recent Results (from the past 240 hour(s))  MRSA PCR SCREENING     Status: Normal   Collection Time   06/11/12  9:21 PM      Component Value Range Status Comment   MRSA by PCR NEGATIVE  NEGATIVE Final      Studies:  Recent x-ray studies have been reviewed in detail by the Attending Physician  Scheduled Meds:  Reviewed in detail by the Attending Physician   Erin Hearing, ANP [phone]  If 7PM-7AM, please contact night-coverage [email or web address] Password [email or web address] 06/14/2012, 12:57 PM   LOS: 3 days   I have personally examined this patient and reviewed the entire database. I have reviewed the above note, made any necessary editorial changes, and agree with its  content.  Cherene Altes, MD [facility]

## 2012-06-14 NOTE — Progress Notes (Signed)
Transferred Pt. To [facility] room 4 [facility] reported hand been called to [place] Pt resting no complaints at this time.

## 2012-06-15 DIAGNOSIS — R292 Abnormal reflex: Secondary | ICD-10-CM

## 2012-06-15 NOTE — Discharge Summary (Addendum)
Wrong data entry pt not discharged disregard previous data entry

## 2012-06-15 NOTE — Progress Notes (Signed)
Pt ambulated with Pt this afternoon tolerated fairly well , was  In chair at bed side for couple of hours and transfered back to bed .  With 2 person assist . Denied any c/o at this time. No acute distress , V/S stable.

## 2012-06-15 NOTE — Evaluation (Signed)
Physical Therapy Evaluation Patient Details Name: Brandon Ray MRN: [id number] DOB: 1934/12/14 Today's Date: 06/15/2012 Time: 8850-2774 PT Time Calculation (min): 48 min  PT Assessment / Plan / Recommendation Clinical Impression  77 y/o male admitted to [facility] with progressive right-sided UE numbness and weakness in addition to bilateral LE sensory changes and weakness. 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 Presents to PT today with sensory and strength deficits affecting pt's balance, gait and independence with functional mobility. Will benefit physical therapy in the acute setting to address these so as to maxmize function and facilitate safe d/c to next venue. Rec CIR for f/u prior to d/c home.     PT Assessment  Patient needs continued PT services    Follow Up Recommendations  Inpatient Rehab   Barriers to Discharge        Equipment Recommendations  None recommended by PT    Recommendations for Other Services Rehab consult; OT consult  Frequency Min 4X/week    Precautions / Restrictions Precautions Precautions: Fall         Mobility  Bed Mobility Bed Mobility: Rolling Left;Left Sidelying to Sit Rolling Left: 4: Min guard;With rail Left Sidelying to Sit: 4: Min guard;With rails Sitting - Scoot to Edge of Bed: 4: Min guard Details for Bed Mobility Assistance: increased effort with bed mobility however no physical assist needed Transfers Transfers: Sit to Stand;Stand to Sit (3 trials) Sit to Stand: 3: Mod assist;With upper extremity assist;From elevated surface;From bed Stand to Sit: 4: Min assist;With upper extremity assist;To bed;To chair/3-in-1;With armrests Details for Transfer Assistance: verbal sequencing cues and proper body positioning in prep to stand (specifically pt using vision to position his feet), heavy mod facilitation for anterior translation of trunk over BOS and upward facilitation for power up to stand and stabilize  using RW, heavy use of upper extremities to assist secondary to weaker lower extremities; decreased eccentric control lowering to chair and bed needing upper extremity assist Ambulation/Gait Ambulation/Gait Assistance: 4: Min assist (with extra person for chair follow) Ambulation Distance (Feet): 50 Feet Ambulation/Gait Assistance Details: pt very focused on his feet secondary to decreased sensation, cues for tall posture and proper technique with RW, easily fatigued Gait Pattern: Trunk flexed;Narrow base of support;Decreased step length - right;Decreased step length - left;Decreased hip/knee flexion - right;Decreased hip/knee flexion - left;Decreased dorsiflexion - right;Decreased dorsiflexion - left General Gait Details: decreased step height              PT Diagnosis: Difficulty walking;Abnormality of gait;Generalized weakness  PT Problem List: Decreased strength;Impaired sensation;Decreased mobility;Decreased balance;Decreased activity tolerance PT Treatment Interventions: DME instruction;Gait training;Functional mobility training;Therapeutic activities;Therapeutic exercise;Balance training;Neuromuscular re-education;Patient/family education   PT Goals Acute Rehab PT Goals PT Goal Formulation: With patient Time For Goal Achievement: 06/22/12 Potential to Achieve Goals: Good Pt will go Sit to Stand: with supervision PT Goal: Sit to Stand - Progress: Goal set today Pt will go Stand to Sit: with supervision PT Goal: Stand to Sit - Progress: Goal set today Pt will Ambulate: 51 - 150 feet;with supervision;with least restrictive assistive device PT Goal: Ambulate - Progress: Goal set today Pt will Perform Home Exercise Program: Independently PT Goal: Perform Home Exercise Program - Progress: Goal set today  Visit Information  Last PT Received On: 06/15/12 Assistance Needed: +2 (for safety)    Subjective Data  Subjective: I haven't been out of bed since Sunday.    Prior  Functioning  Home Living Lives With: Spouse Available Help at  Discharge: Available 24 hours/day Type of Home: House Home Access: Stairs to enter [employer] of Steps: 1 Entrance Stairs-Rails: None Home Layout: Two level;Able to live on main level with bedroom/bathroom Bathroom Shower/Tub: Walk-in shower;Door [employer] Toilet: Standard Bathroom Accessibility: Yes How Accessible: Accessible via walker Home Adaptive Equipment: Built-in shower seat;Walker - rolling;Straight cane Prior Function Level of Independence: Independent Able to Take Stairs?: Yes Driving: Yes Comments: likes photography, walking and hiking. Very active prior to last admission. For the past two weeks ambulation has been getting much harder for him because of weakness Communication Communication: No difficulties    Cognition  Overall Cognitive Status: Appears within functional limits for tasks assessed/performed Arousal/Alertness: Awake/alert Orientation Level: Appears intact for tasks assessed Behavior During Session: [facility] for tasks performed Cognition - Other Comments: Very pleasant intelligent gentleman    Extremity/Trunk Assessment Right Upper Extremity Assessment RUE ROM/Strength/Tone: Deficits RUE ROM/Strength/Tone Deficits: grossly 4/5 RUE Sensation: Deficits RUE Sensation Deficits: reports diminished LT Right Lower Extremity Assessment RLE Sensation: History of peripheral neuropathy;Deficits RLE Sensation Deficits: diminished LT, proprioception and pin prick sensation as well as increased hypersensitivity to dorsum of feet Left Lower Extremity Assessment LLE Sensation: History of peripheral neuropathy;Deficits LLE Sensation Deficits: diminished LT, proprioception and pin prick sensation as well as increased hypersensitivity to dorsum of feet   Balance Static Standing Balance Static Standing - Level of Assistance: 4: Min assist;3: Mod assist Static Standing - Comment/# of Minutes: pt with  lateral lean left needing min-mod facilitation to correct, heavy reliance on upper extremities to stabilize and support weaker legs  End of Session PT - End of Session Equipment Utilized During Treatment: Gait belt Activity Tolerance: Patient tolerated treatment well;Patient limited by fatigue Patient left: in chair;with call bell/phone within reach Nurse Communication: Mobility status  GP     [place] 06/15/2012, 2:38 PM

## 2012-06-15 NOTE — Progress Notes (Addendum)
[facility] PROGRESS NOTE    SUBJECTIVE   No change in symptoms.  He still has decreased sensation in tips of thumb, index and middle finger. weakness still present in hip flexion bilaterally. He has not been seen by PT yet.    OBJECTIVE   Vital signs in last 24 hours: Temp:  [97.6 F (36.4 C)-98.1 F (36.7 C)] 98.1 F (36.7 C) (09/26 0548) Pulse Rate:  [75-82] 75  (09/26 0548) Resp:  [16-18] 16  (09/26 0548) BP: (109-130)/(60-85) 128/67 mmHg (09/26 0548) SpO2:  [96 %-99 %] 96 % (09/26 0548) Weight:  [83.2 kg (183 lb 6.8 oz)-83.6 kg (184 lb 4.9 oz)] 83.2 kg (183 lb 6.8 oz) (09/26 0548)  Intake/Output from previous day: 09/25 0701 - 09/26 0700 In: -  Out: 2375 [Urine:2375] Intake/Output this shift:   Nutritional status: General  Past Medical History  Diagnosis Date  . Coronary artery disease   . Hyperlipidemia   . Seizure disorder   . Seizures   . Heart attack   . Fever   . Weakness   . Leg swelling     both legs  . Abdominal distension   . Abdominal pain   . Constipation   . Bruises easily   . Pleural effusion   . Small bowel obstruction   . Peripheral neuropathy     Neurologic ROS negative with exception of above. Musculoskeletal ROS weakness  Neurologic Exam:  Mental Status:  Alert, oriented, thought content appropriate. Speech fluent without evidence of aphasia. Able to follow 3 step commands without difficulty.  Cranial Nerves:  II: Discs flat bilaterally; Visual fields grossly normal, pupils equal, round, reactive to light and accommodation  III,IV, VI: ptosis not present, extra-ocular motions intact bilaterally  V,VII: smile symmetric, facial light touch sensation normal bilaterally  VIII: hearing normal bilaterally  IX,X: gag reflex present  XI: bilateral shoulder shrug  XII: midline tongue extension  Motor:  Right : Upper extremity 5/5   Left:  Upper extremity 5/5   Lower extremity 4/5   Lower  extremity 4/5  Tone and bulk:normal tone throughout; no atrophy noted  Sensory: Pinprick and light touch intact throughout, bilaterally with the exception of stated above Deep Tendon Reflexes: 2+ UE bilaterally, no KJ or AJ.  Plantars:  Right: mute Left: mute  Cerebellar:  normal finger-to-nose, normal heel-to-shin test  CV: pulses palpable throughout    Lab Results: No results found for this basename: cbc, bmp, coags, chol, tri, ldl, hga1c   Lipid Panel No results found for this basename: CHOL,TRIG,HDL,CHOLHDL,VLDL,LDLCALC in the last 72 hours  Studies/Results: No results found.  Medications:     Scheduled:   . aspirin EC  81 mg Oral Daily  . calcium-vitamin D  1 tablet Oral Q breakfast  . folic acid  1 mg Oral Daily  . heparin  1,000 Units Intracatheter Once  . PHENobarbital  97.2 mg Oral QHS  . phenytoin  100 mg Oral QHS  . polyethylene glycol  17 g Oral Daily  . sodium chloride 0.9 % 500 mL with albumin human 25 g infusion   Intravenous Once  . sodium chloride  3 mL Intravenous Q12H  . sodium chloride  3 mL Intravenous Q12H  . 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 He has received 3 doses PLX with continued improvement of right UE but no significant improvement of bilateral proximal weakness.   He is scheduled for 2 more PLX.   We will continue with this course of plasmapheresis is planned. No changes in current management participated    Etta Quill PA-C [facility] [phone]  06/15/2012, 9:34 AM

## 2012-06-15 NOTE — Progress Notes (Addendum)
[facility] PROGRESS NOTE  Brandon Ray QMG:[id number] DOB: 12-21-1934 DOA: 06/11/2012 PCP: [name], MD  Assessment/Plan: Principal Problem:  *Chronic inflammatory demyelinating polyneuropathy Active Problems:  Coronary artery disease  Hyperlipidemia  Seizure disorder  Acute urinary retention  Neurogenic bladder  Chronic inflammatory demyelinating polyneuropathy only mild improvement in irritation of right arm, but little to no improvement of the strength of the legs or perineal discomfort or bladder control.   *Appreciate neurology assistance  * Continue PLX x 5 treatments, on 9/27, 9/29?, and two days after * Family asking about steroids - stated someone had mentioned this to them earlier and they were wondering if there would be a need for steroids.    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  Per patient, bladder sensation has not improved. *patient developed acute urinary retention after arrival and has required in and out catheterization times one  * on second episode of retention, a Foley catheter was placed and 1 L of urine was obtained, therefore, the catheter was left in place-would continue Foley catheter for now as we await further improvement in neurological symptoms - will try to DC after completion of PLX x 5.   Coronary artery disease asymptomatic and EKG without ischemic change  Hyperlipidemia no evidence of myopathy or statin induced rhabdomyolysis, however, as statins can cause mild muscle weakness will hold for now and restart when no longer requiring frequent nerve/strength assessments (post PLX) Seizure disorder Continue phenobarbital and Dilantin  Rales at bases:  Likely atelectasis from being in bed.  No fevers or leukocytosis. -  OOB BID -  Incentive spirometry -  Continue PT  DIET:  regular ACCESS:  Right IJ HemoSplit HD catheter placed 9/23 by IR IVF:  none PROPH:  SCDs  Code Status: Full  code Family Communication: Updated patient and wife regarding plan of care Disposition Plan: Pending completion of PLX.  PT consult pending.     Brief narrative:  77 year old male patient with primary underlying medical problems of coronary artery disease and seizure disorder. He is previously been treated for polyneuropathy of unknown etiology in May of this year. At that time he received IVIG but later developed a rash therefore this medication was discontinued. He once again presented to the hospital last week for increasing numbness in the right upper extremity. Evaluation at that time including MRI revealed neural encroachment at the C4-C5 and C6-C7 levels. He was discharged on 06/08/2012. He was evaluated by neurology during that admission and plans were to follow up in neurology after discharge. At time of discharge no new treatment modalities were initiated. After presentation to the emergency department this time the neurologist Dr. 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 According to the neurologist notes the patient had recently been evaluated by a nurse specialist at [facility] who offered the patient the choice of plasma exchange, IVIG her steroids. Unfortunately the patient had not yet had the chance to return back to this physician before having worsening of his symptoms. He did report to the neurologist that several weeks ago he had received multiple vaccinations and subsequently noticed worsening of his symptoms initially in the right leg and right arm but then also affecting the left leg. The symptoms have steadily been ascending and patient did not been able to function at home. He also endorsed constipation. Because of his significant neurological symptoms he was admitted to the step down unit for further  evaluation and treatment. In addition to a neurologist was recommending plasma exchange and has requested for  interventional radiology to place a pheresis catheter on 06/12/2012.   Consultants:  Neurology  Interventional Radiology  Procedures: Placement of a right IJ approach tunneled HemoSplit HD catheter for the purpose of plasmapheresis interventional radiology   Antibiotics:  None  HPI/Subjective:  Patient states that right arm feels irritated, but better than before.  He had the IV removed and some gauze placed under his wrist band, which have helped.  States strength in arm and legs is stable.  He does not have control over bowels and bladder still.  Otherwise, he denies chest pain, shortness of breath, nausea, vomiting, and pain.  Objective: Filed Vitals:   06/14/12 1525 06/14/12 1526 06/14/12 2043 06/15/12 0548  BP: 109/85  130/60 128/67  Pulse: 77  82 75  Temp:   98 F (36.7 C) 98.1 F (36.7 C)  TempSrc:  Oral Oral Oral  Resp: $Remo'16  18 16  'SuZOY$ Height:   '6\' 1"'$  (1.854 m)   Weight:   83.6 kg (184 lb 4.9 oz) 83.2 kg (183 lb 6.8 oz)  SpO2: 97%  99% 96%    Intake/Output Summary (Last 24 hours) at 06/15/12 0715 Last data filed at 06/15/12 0549  Gross per 24 hour  Intake      0 ml  Output   2375 ml  Net  -2375 ml   Filed Weights   06/14/12 0700 06/14/12 2043 06/15/12 0548  Weight: 84.4 kg (186 lb 1.1 oz) 83.6 kg (184 lb 4.9 oz) 83.2 kg (183 lb 6.8 oz)    Exam:  General: No acute respiratory distress  Lungs:  Rales that clear somewhat in bilateral bases, no wheezes or rhonchi Cardiovascular: Regular rate and rhythm without murmur gallop or rub normal S1 and S2, 1+ extremity edema  Abdomen: Nontender, nondistended, soft, bowel sounds positive, no rebound, no ascites, no appreciable mass  Musculoskeletal: No significant cyanosis, clubbing of extremities  Neurological: bilateral lower extremity weakness 4/5 with more difficulty with foot dorsiflexion and hip flexion.  Right upper extremity weakness is negligible when tested extremities are 5/5. Unable to illicit lower extremity  reflexes.  CN II-XII grossly intact. Psych:  A&Ox4  Data Reviewed: Basic Metabolic Panel:  Lab 89/21/19 1434 06/11/12 1518  NA 140 139  K 4.3 4.1  CL 102 103  CO2 30 26  GLUCOSE 101* 111*  BUN 16 21  CREATININE 0.81 0.76  CALCIUM 9.5 9.1  MG -- --  PHOS -- --   Liver Function Tests:  Lab 06/12/12 1434 06/11/12 1518  AST 22 24  ALT 26 29  ALKPHOS 98 102  BILITOT 0.4 0.2*  PROT 6.6 6.7  ALBUMIN 3.6 3.6   No results found for this basename: LIPASE:5,AMYLASE:5 in the last 168 hours No results found for this basename: AMMONIA:5 in the last 168 hours CBC:  Lab 06/12/12 1434 06/11/12 1518  WBC 5.3 4.2  NEUTROABS -- 2.4  HGB 14.4 14.3  HCT 41.8 40.5  MCV 94.1 92.9  PLT 165 162   Cardiac Enzymes: No results found for this basename: CKTOTAL:5,CKMB:5,CKMBINDEX:5,TROPONINI:5 in the last 168 hours BNP (last 3 results) No results found for this basename: PROBNP:3 in the last 8760 hours CBG: No results found for this basename: GLUCAP:5 in the last 168 hours  Recent Results (from the past 240 hour(s))  MRSA PCR SCREENING     Status: Normal   Collection Time   06/11/12  9:21  PM      Component Value Range Status Comment   MRSA by PCR NEGATIVE  NEGATIVE Final      Studies: No results found.  Scheduled Meds:   . therapeutic plasma exchange solution   Dialysis Once in dialysis  . therapeutic plasma exchange solution   Dialysis Once in dialysis  . aspirin EC  81 mg Oral Daily  . calcium-vitamin D  1 tablet Oral Q breakfast  . folic acid  1 mg Oral Daily  . heparin  1,000 Units Intracatheter Once  . PHENobarbital  97.2 mg Oral QHS  . phenytoin  100 mg Oral QHS  . polyethylene glycol  17 g Oral Daily  . sodium chloride 0.9 % 500 mL with albumin human 25 g infusion   Intravenous Once  . sodium chloride  3 mL Intravenous Q12H  . sodium chloride  3 mL Intravenous Q12H  . DISCONTD: atorvastatin  20 mg Oral QHS   Continuous Infusions:   . citrate dextrose 500 mL  (06/14/12 0810)  . sodium chloride 0.9 % 500 mL with albumin human 25 g infusion 20 mL/hr at 06/12/12 1809    Principal Problem:  *Chronic inflammatory demyelinating polyneuropathy Active Problems:  Coronary artery disease  Hyperlipidemia  Seizure disorder  Acute urinary retention  Neurogenic bladder    Time spent: 30    Brandon Ray, [facility] Pager [phone]. If 8PM-8AM, please contact night-coverage at [email or web address], password [facility] 06/15/2012, 7:15 AM  LOS: 4 days

## 2012-06-16 ENCOUNTER — Inpatient Hospital Stay (HOSPITAL_COMMUNITY): Payer: Medicare Other

## 2012-06-16 DIAGNOSIS — E785 Hyperlipidemia, unspecified: Secondary | ICD-10-CM

## 2012-06-16 DIAGNOSIS — G6181 Chronic inflammatory demyelinating polyneuritis: Secondary | ICD-10-CM

## 2012-06-16 LAB — BASIC METABOLIC PANEL
Calcium: 8.6 mg/dL (ref 8.4–10.5)
GFR calc non Af Amer: 88 mL/min — ABNORMAL LOW (ref >90)
Sodium: 137 mEq/L (ref 135–145)

## 2012-06-16 LAB — CBC
Platelets: 122 10*3/uL — ABNORMAL LOW (ref 150–400)
RBC: 3.86 MIL/uL — ABNORMAL LOW (ref 4.22–5.81)
WBC: 5.4 10*3/uL (ref 4.0–10.5)

## 2012-06-16 MED ORDER — LACTATED RINGERS IV SOLN
INTRAVENOUS | Status: DC
  Filled 2012-06-16 (×3): qty 200

## 2012-06-16 MED ORDER — ACD FORMULA A 0.73-2.45-2.2 GM/100ML VI SOLN
Status: AC
  Administered 2012-06-16: 15:00:00
  Filled 2012-06-16: qty 500

## 2012-06-16 MED ORDER — SODIUM CHLORIDE 0.9 % IV SOLN
INTRAVENOUS | Status: AC
  Administered 2012-06-16 (×3): via INTRAVENOUS_CENTRAL
  Filled 2012-06-16 (×3): qty 200

## 2012-06-16 MED ORDER — SODIUM CHLORIDE 0.9 % IV SOLN
INTRAVENOUS | Status: DC
  Filled 2012-06-16 (×2): qty 200

## 2012-06-16 NOTE — Progress Notes (Signed)
[facility] PROGRESS NOTE  Brandon Ray:[id number] DOB: June 06, 1935 DOA: 06/11/2012 PCP: [name], MD  Assessment/Plan: Principal Problem:  *Chronic inflammatory demyelinating polyneuropathy Active Problems:  Coronary artery disease  Hyperlipidemia  Seizure disorder  Acute urinary retention  Neurogenic bladder  Chronic inflammatory demyelinating polyneuropathy with stable irritation of right arm and lack of bowel/bladder control, but improved strength of lower extremities today.   *Appreciate neurology assistance  * Continue PLX x 5 treatments, 3/5 today, then 9/29?, and 10/1? * Family asking about steroids - stated someone had mentioned this to them earlier and they were wondering if there would be a need for steroids.    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  Per patient, bladder sensation has not improved. *patient developed acute urinary retention after arrival and has required in and out catheterization times one  * on second episode of retention, a Foley catheter was placed and 1 L of urine was obtained, therefore, the catheter was left in place-would continue Foley catheter for now as we await further improvement in neurological symptoms - will try to DC after completion of PLX x 5.   Coronary artery disease asymptomatic and EKG without ischemic change -  Continue asas $RemoveBefor'81mg'CSLOmVxCSbLu$  daily -  Will discuss with family addition of beta blocker or ACEI closer to discharge.  Will not add blood pressure medication while undergoing PLX unless BP persistently elevated.  Hyperlipidemia no evidence of myopathy or statin induced rhabdomyolysis, however, as statins can cause mild muscle weakness will hold for now and restart when no longer requiring frequent nerve/strength assessments (post PLX) -  Add statin pending PLX completion  Seizure disorder Continue phenobarbital and Dilantin  Rales at bases:  Likely atelectasis from being in bed.   No fevers or leukocytosis. -  OOB BID -  Incentive spirometry -  Continue PT  DIET:  regular ACCESS:  Right IJ HemoSplit HD catheter placed 9/23 by IR IVF:  none PROPH:  SCDs  Code Status: Full code Family Communication: Updated patient of plan of care Disposition Plan: Pending completion of PLX.  To inpatient rehabilitation.     Brief narrative:  78 year old male patient with primary underlying medical problems of coronary artery disease and seizure disorder. He is previously been treated for polyneuropathy of unknown etiology in May of this year. At that time he received IVIG but later developed a rash therefore this medication was discontinued. He once again presented to the hospital last week for increasing numbness in the right upper extremity. Evaluation at that time including MRI revealed neural encroachment at the C4-C5 and C6-C7 levels. He was discharged on 06/08/2012. He was evaluated by neurology during that admission and plans were to follow up in neurology after discharge. At time of discharge no new treatment modalities were initiated. After presentation to the emergency department this time the neurologist Dr. 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 According to the neurologist notes the patient had recently been evaluated by a nurse specialist at [facility] who offered the patient the choice of plasma exchange, IVIG her steroids. Unfortunately the patient had not yet had the chance to return back to this physician before having worsening of his symptoms. He did report to the neurologist that several weeks ago he had received multiple vaccinations and subsequently noticed worsening of his symptoms initially in the right leg and right arm but then also affecting the left leg. The symptoms have  steadily been ascending and patient did not been able to function at home. He also endorsed constipation. Because of his  significant neurological symptoms he was admitted to the step down unit for further evaluation and treatment. In addition to a neurologist was recommending plasma exchange and has requested for interventional radiology to place a pheresis catheter on 06/12/2012.   Consultants:  Neurology  Interventional Radiology  Procedures: Placement of a right IJ approach tunneled HemoSplit HD catheter for the purpose of plasmapheresis interventional radiology   Antibiotics:  None  HPI/Subjective:  Patient states that right arm feels irritated but the strength in arm and legs is improved somewhat.  He does not have control over bowels and bladder still.  Otherwise, he denies chest pain, shortness of breath, nausea, vomiting, and pain.    Objective: Filed Vitals:   06/15/12 0548 06/15/12 1819 06/15/12 2210 06/16/12 0500  BP: 128/67 149/56 129/54 131/50  Pulse: 75 89 73 76  Temp: 98.1 F (36.7 C) 97.8 F (36.6 C) 97.4 F (36.3 C) 98.4 F (36.9 C)  TempSrc: Oral Oral Oral Oral  Resp: $Remo'16 18 18 18  'rYKbO$ Height:      Weight: 83.2 kg (183 lb 6.8 oz)   84.4 kg (186 lb 1.1 oz)  SpO2: 96% 100% 100% 96%    Intake/Output Summary (Last 24 hours) at 06/16/12 0806 Last data filed at 06/16/12 0600  Gross per 24 hour  Intake    880 ml  Output    825 ml  Net     55 ml   Filed Weights   06/14/12 2043 06/15/12 0548 06/16/12 0500  Weight: 83.6 kg (184 lb 4.9 oz) 83.2 kg (183 lb 6.8 oz) 84.4 kg (186 lb 1.1 oz)    Exam:  General: No acute respiratory distress  Lungs:  CTAB Cardiovascular: Regular rate and rhythm without murmur gallop or rub normal S1 and S2, 1+ extremity edema  Abdomen: Nontender, nondistended, soft, bowel sounds positive, no rebound, no ascites, no appreciable mass  Musculoskeletal: No significant cyanosis, clubbing of extremities  Neurological: bilateral lower extremity weakness 4+/5 with more difficulty with foot dorsiflexion and hip flexion.  Right upper extremity weakness is  negligible when tested extremities are 5/5. Unable to illicit lower extremity reflexes.  CN II-XII grossly intact. Psych:  A&Ox4  Data Reviewed: Basic Metabolic Panel:  Lab 26/33/35 1434 06/11/12 1518  NA 140 139  K 4.3 4.1  CL 102 103  CO2 30 26  GLUCOSE 101* 111*  BUN 16 21  CREATININE 0.81 0.76  CALCIUM 9.5 9.1  MG -- --  PHOS -- --   Liver Function Tests:  Lab 06/12/12 1434 06/11/12 1518  AST 22 24  ALT 26 29  ALKPHOS 98 102  BILITOT 0.4 0.2*  PROT 6.6 6.7  ALBUMIN 3.6 3.6   No results found for this basename: LIPASE:5,AMYLASE:5 in the last 168 hours No results found for this basename: AMMONIA:5 in the last 168 hours CBC:  Lab 06/12/12 1434 06/11/12 1518  WBC 5.3 4.2  NEUTROABS -- 2.4  HGB 14.4 14.3  HCT 41.8 40.5  MCV 94.1 92.9  PLT 165 162   Cardiac Enzymes: No results found for this basename: CKTOTAL:5,CKMB:5,CKMBINDEX:5,TROPONINI:5 in the last 168 hours BNP (last 3 results) No results found for this basename: PROBNP:3 in the last 8760 hours CBG: No results found for this basename: GLUCAP:5 in the last 168 hours  Recent Results (from the past 240 hour(s))  MRSA PCR SCREENING     Status:  Normal   Collection Time   06/11/12  9:21 PM      Component Value Range Status Comment   MRSA by PCR NEGATIVE  NEGATIVE Final      Studies: No results found.  Scheduled Meds:    . therapeutic plasma exchange solution   Dialysis Q1 Hr x 3  . aspirin EC  81 mg Oral Daily  . calcium-vitamin D  1 tablet Oral Q breakfast  . folic acid  1 mg Oral Daily  . heparin  1,000 Units Intracatheter Once  . PHENobarbital  97.2 mg Oral QHS  . phenytoin  100 mg Oral QHS  . polyethylene glycol  17 g Oral Daily  . sodium chloride 0.9 % 500 mL with albumin human 25 g infusion   Intravenous Once  . sodium chloride  3 mL Intravenous Q12H  . sodium chloride  3 mL Intravenous Q12H  . DISCONTD: therapeutic plasma exchange solution   Dialysis Q1 Hr x 3  . DISCONTD: therapeutic  plasma exchange solution   Dialysis Q1 Hr x 3   Continuous Infusions:    . citrate dextrose 500 mL (06/14/12 0810)  . sodium chloride 0.9 % 500 mL with albumin human 25 g infusion 20 mL/hr at 06/12/12 1809    Principal Problem:  *Chronic inflammatory demyelinating polyneuropathy Active Problems:  Coronary artery disease  Hyperlipidemia  Seizure disorder  Acute urinary retention  Neurogenic bladder    Time spent: 30    Takoda Janowiak, [facility] Pager [phone]. If 8PM-8AM, please contact night-coverage at [email or web address], password [facility] 06/16/2012, 8:06 AM  LOS: 5 days

## 2012-06-16 NOTE — Progress Notes (Signed)
Subjective:  Patient with CIDP and PLX . Patient seen this morning He is doing well. He said there is some improvement on the left leg but right leg still not strong enough. He denies any other complaints   Objective: Current vital signs: BP 131/50  Pulse 76  Temp 98.4 F (36.9 C) (Oral)  Resp 18  Ht $R'6\' 1"'CO$  (1.854 m)  Wt 84.4 kg (186 lb 1.1 oz)  BMI 24.55 kg/m2  SpO2 96% Vital signs in last 24 hours: Temp:  [97.4 F (36.3 C)-98.4 F (36.9 C)] 98.4 F (36.9 C) (09/27 0500) Pulse Rate:  [73-89] 76  (09/27 0500) Resp:  [18] 18  (09/27 0500) BP: (129-149)/(50-56) 131/50 mmHg (09/27 0500) SpO2:  [96 %-100 %] 96 % (09/27 0500) Weight:  [84.4 kg (186 lb 1.1 oz)] 84.4 kg (186 lb 1.1 oz) (09/27 0500)  Intake/Output from previous day: 09/26 0701 - 09/27 0700 In: 1120 [P.O.:720] Out: 825 [Urine:825] Intake/Output this shift:   Nutritional status: General  Neurologic Exam: Mental Status: Alert, oriented, thought content appropriate.  Speech fluent without evidence of aphasia.  Able to follow 3 step commands without difficulty. Cranial Nerves: II: visual fields grossly normal, pupils equal, round, reactive to light and accommodation III,IV, VI: ptosis not present, extra-ocular motions intact bilaterally V,VII: smile symmetric, facial light touch sensation normal bilaterally VIII: hearing normal bilaterally IX,X: gag reflex present XI: trapezius strength/neck flexion strength normal bilaterally XII: tongue strength normal  Motor: Right : Upper extremity   5/5    Left:     Upper extremity   5/5  Lower extremity   4/5  And distal is 4-     Lower extremity    4+/5  And distal 4/5 Tone and bulk:normal tone throughout; no atrophy noted Sensory: Pinprick and light touch intact throughout, bilaterally Deep Tendon Reflexes:  Patella and Achilles 0 Plantars: Right: downgoing   Left: downgoing     Lab Results: No results found for this or any previous visit (from the past 48  hour(s)).  Recent Results (from the past 240 hour(s))  MRSA PCR SCREENING     Status: Normal   Collection Time   06/11/12  9:21 PM      Component Value Range Status Comment   MRSA by PCR NEGATIVE  NEGATIVE Final     Lipid Panel No results found for this basename: CHOL,TRIG,HDL,CHOLHDL,VLDL,LDLCALC in the last 72 hours  Studies/Results: No results found.  Medications:  Scheduled:   . therapeutic plasma exchange solution   Dialysis Q1 Hr x 3  . aspirin EC  81 mg Oral Daily  . calcium-vitamin D  1 tablet Oral Q breakfast  . folic acid  1 mg Oral Daily  . heparin  1,000 Units Intracatheter Once  . PHENobarbital  97.2 mg Oral QHS  . phenytoin  100 mg Oral QHS  . polyethylene glycol  17 g Oral Daily  . sodium chloride 0.9 % 500 mL with albumin human 25 g infusion   Intravenous Once  . sodium chloride  3 mL Intravenous Q12H  . sodium chloride  3 mL Intravenous Q12H  . DISCONTD: therapeutic plasma exchange solution   Dialysis Q1 Hr x 3  . DISCONTD: therapeutic plasma exchange solution   Dialysis Q1 Hr x 3    Assessment/Plan: Patient with CIDP Continue with PLX   Today and and on 9/29  Brandon Ray V-P Donnie Mesa., MD., Ph.D.,MS 06/16/2012 9:17 AM

## 2012-06-16 NOTE — Progress Notes (Signed)
Physical Therapy Treatment Patient Details Name: Brandon Ray MRN: [id number] DOB: 1935-08-27 Today's Date: 06/16/2012 Time: 6578-4696 PT Time Calculation (min): 45 min  PT Assessment / Plan / Recommendation Comments on Treatment Session  Brandon Ray is making progress but still demonstrating weakness, sensation changes and resultant balance deficits that I feel would be best be addressed in more intensive rehab. He is very motivated. Patient and wife to perform HEP they recieved from last admission over the weekend.  Continue to recommend CIR for d/c.     Follow Up Recommendations  Inpatient Rehab    Barriers to Discharge        Equipment Recommendations  None recommended by PT    Recommendations for Other Services  OT consult  Frequency     Plan Discharge plan remains appropriate;Frequency remains appropriate    Precautions / Restrictions Precautions Precautions: Fall       Mobility  Bed Mobility Bed Mobility: Scooting to [facility];Sit to Supine Sit to Supine: 4: Min assist Scooting to [facility]: 6: Modified independent (Device/Increase time) Details for Bed Mobility Assistance: cues for efficiency of movement and minA to bring legs to bed level Transfers Sit to Stand: 1: +2 Total assist;With upper extremity assist;From chair/3-in-1 Sit to Stand: Patient Percentage: 60% Stand to Sit: 3: Mod assist;Without upper extremity assist;To bed;To chair/3-in-1 Details for Transfer Assistance: sit<>stand x5 for strengthening and neuro re-ed for improvement of technique and improved use of lower extremities for power up to tall standing; manual facilitation bilaterally for anterior translationof trunk over BOS as well as upward facilitation for extension; verbal cues for technique, initially pt needing RW and heavy use of upper extremities to assist with weaker lower extremities, pt then cued to stand (pushing from chair) to not grab immediately for RW and push for tall standing needing mod  facilitation for trunk extension; mod facilitation to control descent to chair without use of upper extremities to control Ambulation/Gait Ambulation/Gait Assistance: 4: Min assist Ambulation Distance (Feet): 100 Feet Assistive device: Rolling walker Ambulation/Gait Assistance Details: cues for forward gaze and faciltation for upright posture and safe technique with RW, min tactile assist for stability Gait Pattern: Step-through pattern;Narrow base of support General Gait Details: genu valgus knee posture, decreased muscular control of RLE with swing, heavy use of upper extremities on RW secondary to weak lower extremities     PT Goals Acute Rehab PT Goals PT Goal: Sit to Stand - Progress: Progressing toward goal PT Goal: Stand to Sit - Progress: Progressing toward goal PT Goal: Ambulate - Progress: Progressing toward goal PT Goal: Perform Home Exercise Program - Progress: Progressing toward goal  Visit Information  Last PT Received On: 06/16/12 Assistance Needed: +2 (for safety)    Subjective Data  Subjective: I have gotten up twice since you got me up yesterday.    Cognition  Overall Cognitive Status: Appears within functional limits for tasks assessed/performed Arousal/Alertness: Awake/alert Orientation Level: Appears intact for tasks assessed Behavior During Session: [facility] for tasks performed    Balance  Static Standing Balance Static Standing - Balance Support: Bilateral upper extremity supported;No upper extremity supported Static Standing - Level of Assistance: 4: Min assist;5: Stand by assistance Static Standing - Comment/# of Minutes: With RW pt SBA but relying heavily on RW for stability (tense shoulder/neck posture); without upper extremity support pt with several LOB posteriorly/laterally needing min-modA to correct, demonstrating decreased ankle or hip balance strategies Dynamic Standing Balance Dynamic Standing - Balance Support: No upper extremity supported Dynamic  Standing -  Level of Assistance: 1: +2 Total assist (70%) Dynamic Standing - Balance Activities: Forward lean/weight shifting;Lateral lean/weight shifting Dynamic Standing - Comments: pt stood x3 for at least 2 minutes each session with no upper extremity support however needing bilateral support in the case of LOB (which happened several times); pt performed anterior/posterior weight shifting needing mod facilitation at hips for technique as well as forward gaze with a focus on ankle balance strategy; pt performed lateral  weight shifting with flat foot with mod faciliation for stability  as well as lateral weight shift with attempts at heel lift (pt's knees begin to flex with poor control in unilateral stance)   End of Session PT - End of Session Equipment Utilized During Treatment: Gait belt Activity Tolerance: Patient tolerated treatment well Patient left: in bed;with call bell/phone within reach Nurse Communication: Mobility status   GP     [place] 06/16/2012, 3:43 PM

## 2012-06-16 NOTE — Consult Note (Signed)
Physical Medicine and Rehabilitation Consult Reason for Consult: Chronic inflammatory demyelinating polyneuropathy Referring Physician: [facility]   HPI: Brandon Ray is a 77 y.o. right-handed male with history of coronary artery disease, CABG, seizure disorder and progressive  polyneuropathy for which was initially treated in May of this year and followed by Dr. Krista Blue of neurology services with IVIG and subsequent developed some rash and was discontinued. Presented 06/11/2012 with increasing numbness and weakness right upper extremity. Recent MRI of the brain showed mild chronic microvascular ischemic change no acute abnormality and MRA of the head negative. MRI lumbar spine without significant nerve root compression and cervical spine MRI with multilevel spondylosis again without significant cord compression . 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 He remains on Dilantin for history of seizure disorder. Physical therapy evaluation completed 06/15/2012 with recommendations of physical medicine and rehabilitation consult to consider inpatient rehabilitation services.  Review of Systems  Eyes: Negative for double vision.  Musculoskeletal: Positive for myalgias.  Skin: Positive for rash.  Neurological: Positive for tingling, seizures and weakness.  All other systems reviewed and are negative.   Past Medical History  Diagnosis Date  . Coronary artery disease   . Hyperlipidemia   . Seizure disorder   . Seizures   . Heart attack   . Fever   . Weakness   . Leg swelling     both legs  . Abdominal distension   . Abdominal pain   . Constipation   . Bruises easily   . Pleural effusion   . Small bowel obstruction   . Peripheral neuropathy    Past Surgical History  Procedure Date  . Cardiac catheterization 01/27/2003    NORMAL LEFT VENTRICULAR SIZE WITH MILD FOCAL LATERAL WALL HYPOKINESIA. EF 60%  . Coronary artery bypass graft  2004    LIMA GRAFT TO THE LAD, SAPHENOUS VEIN GRAFT TO THE DIAGONAL, SAPHENOUS VEIN GRAFT TO THE FIRST OM, SAPHENOUS VEIN GRAFT TO THE PDA  . Transurethral resection of prostate   . Hand surgery 2000    left  . Abdominal surgery 05/2011    bowel obstruction  . Ankle fracture surgery 1997    left   Family History  Problem Relation Age of Onset  . Asthma Mother   . Heart disease Mother   . Coronary artery disease Father   . Heart disease Father    Social History:  reports that he quit smoking about 46 years ago. His smoking use included Cigarettes. He has a 5 pack-year smoking history. He has never used smokeless tobacco. He reports that he drinks alcohol. He reports that he does not use illicit drugs. Allergies:  Allergies  Allergen Reactions  . Amoxicillin Rash    Upper torso only.   Medications Prior to Admission  Medication Sig Dispense Refill  . aspirin 81 MG tablet Take 81 mg by mouth daily.       Marland Kitchen atorvastatin (LIPITOR) 20 MG tablet Take 20 mg by mouth daily.      . calcium-vitamin D (OSCAL WITH D) 500-200 MG-UNIT per tablet Take 1 tablet by mouth daily.        . Cholecalciferol (VITAMIN D) 2000 UNITS tablet Take 2,000 Units by mouth daily.        . Coenzyme Q10 (CO Q-10 PO) Take 1 tablet by mouth daily.      . folic acid (FOLVITE) 762 MCG tablet Take 800 mcg by mouth daily.       Marland Kitchen  Multiple Vitamin (MULTIVITAMIN) tablet Take 1 tablet by mouth daily.        . Multiple Vitamins-Minerals (PRESERVISION AREDS PO) Take 1 tablet by mouth 2 (two) times daily.       . Omega-3 Fatty Acids (FISH OIL) 1200 MG CAPS Take 1,200 mg by mouth daily.      Marland Kitchen PHENobarbital (LUMINAL) 97.2 MG tablet Take 97.2 mg by mouth daily.        . phenytoin (DILANTIN) 100 MG ER capsule Take 100 mg by mouth daily.       . polyethylene glycol (MIRALAX / GLYCOLAX) packet Take 17 g by mouth daily.        Home: Home Living Lives With: Spouse Available Help at Discharge: Available 24 hours/day Type of  Home: House Home Access: Stairs to enter [employer] of Steps: 1 Entrance Stairs-Rails: None Home Layout: Two level;Able to live on main level with bedroom/bathroom Bathroom Shower/Tub: Walk-in shower;Door [employer] Toilet: Standard Bathroom Accessibility: Yes How Accessible: Accessible via walker Home Adaptive Equipment: Built-in shower seat;Walker - rolling;Straight cane  Functional History: Prior Function Able to Take Stairs?: Yes Driving: Yes Comments: likes photography, walking and hiking. Very active prior to last admission. For the past two weeks ambulation has been getting much harder for him because of weakness Functional Status:  Mobility: Bed Mobility Bed Mobility: Rolling Left;Left Sidelying to Sit Rolling Left: 4: Min guard;With rail Left Sidelying to Sit: 4: Min guard;With rails Sitting - Scoot to Edge of Bed: 4: Min guard Transfers Transfers: Sit to Stand;Stand to Sit Sit to Stand: 3: Mod assist;With upper extremity assist;From elevated surface;From bed Stand to Sit: 4: Min assist;With upper extremity assist;To bed;To chair/3-in-1;With armrests Ambulation/Gait Ambulation/Gait Assistance: 4: Min assist (with extra person for chair follow) Ambulation Distance (Feet): 50 Feet Ambulation/Gait Assistance Details: pt very focused on his feet secondary to decreased sensation, cues for tall posture and proper technique with RW, easily fatigued Gait Pattern: Trunk flexed;Narrow base of support;Decreased step length - right;Decreased step length - left;Decreased hip/knee flexion - right;Decreased hip/knee flexion - left;Decreased dorsiflexion - right;Decreased dorsiflexion - left General Gait Details: decreased step height    ADL:    Cognition: Cognition Arousal/Alertness: Awake/alert Orientation Level: Oriented X4 Cognition Overall Cognitive Status: Appears within functional limits for tasks assessed/performed Arousal/Alertness: Awake/alert Orientation  Level: Appears intact for tasks assessed Behavior During Session: [facility] for tasks performed Cognition - Other Comments: Very pleasant intelligent gentleman  Blood pressure 129/54, pulse 73, temperature 97.4 F (36.3 C), temperature source Oral, resp. rate 18, height $RemoveBe'6\' 1"'zyqQuJwEI$  (1.854 m), weight 83.2 kg (183 lb 6.8 oz), SpO2 100.00%. Physical Exam  Vitals reviewed. Constitutional: He is oriented to person, place, and time. He appears well-developed.  HENT:  Head: Normocephalic.  Eyes:       Pupils round and reactive to light  Neck: Neck supple. No thyromegaly present.  Cardiovascular: Normal rate and regular rhythm.   Pulmonary/Chest: Effort normal and breath sounds normal. No respiratory distress. He has no wheezes.  Abdominal: Bowel sounds are normal. He exhibits no distension. There is no tenderness.  Musculoskeletal: He exhibits no edema.  Neurological: He is alert and oriented to person, place, and time. No cranial nerve deficit.       UE strength grossly 4-5/5. LE grossly 3/5 prox to 3+ to 4 distally. Stocking glove sensory loss in both limbs below the knees. Mild sensory loss over the distal right hand. DTR's are trace  Skin: Skin is warm and dry.  Psychiatric: He  has a normal mood and affect. His behavior is normal. Judgment and thought content normal.    No results found for this or any previous visit (from the past 24 hour(s)). No results found.  Assessment/Plan: Diagnosis: CIDP 1. Does the need for close, 24 hr/day medical supervision in concert with the patient's rehab needs make it unreasonable for this patient to be served in a less intensive setting? Yes 2. Co-Morbidities requiring supervision/potential complications: CAD, Sz d/o,  3. Due to bladder management, bowel management, safety, skin/wound care, disease management, medication administration, pain management and patient education, does the patient require 24 hr/day rehab nursing? Yes 4. Does the patient require  coordinated care of a physician, rehab nurse, PT (1-2 hrs/day, 5 days/week) and OT (1-2 hrs/day, 5 days/week) to address physical and functional deficits in the context of the above medical diagnosis(es)? Yes Addressing deficits in the following areas: balance, endurance, locomotion, strength, transferring, bowel/bladder control, bathing, dressing, feeding, grooming, toileting and psychosocial support 5. Can the patient actively participate in an intensive therapy program of at least 3 hrs of therapy per day at least 5 days per week? Yes 6. The potential for patient to make measurable gains while on inpatient rehab is excellent 7. Anticipated functional outcomes upon discharge from inpatient rehab are mod I to supervision with PT, mod I to supervision with OT, n/a with SLP. 8. Estimated rehab length of stay to reach the above functional goals is: 7-10 days 9. Does the patient have adequate social supports to accommodate these discharge functional goals? Yes 10. Anticipated D/C setting: Home 11. Anticipated post D/C treatments: [place] therapy 12. Overall Rehab/Functional Prognosis: excellent  RECOMMENDATIONS: This patient's condition is appropriate for continued rehabilitative care in the following setting: CIR Patient has agreed to participate in recommended program. Yes Note that insurance prior authorization may be required for reimbursement for recommended care.  Comment: Will follow for progress with therapy and plasmapheresis. He should benefit from a relatively short inpatient rehab admit.  Oval Linsey, MD    06/16/2012

## 2012-06-17 MED ORDER — MUPIROCIN 2 % EX OINT
TOPICAL_OINTMENT | Freq: Two times a day (BID) | CUTANEOUS | Status: DC
  Administered 2012-06-17 – 2012-06-20 (×6): via TOPICAL
  Filled 2012-06-17: qty 22

## 2012-06-17 MED ORDER — POLYETHYLENE GLYCOL 3350 17 G PO PACK
17.0000 g | PACK | Freq: Once | ORAL | Status: AC
  Administered 2012-06-17: 17 g via ORAL
  Filled 2012-06-17: qty 1

## 2012-06-17 NOTE — Progress Notes (Signed)
Subjective:  Patient with CIDP.  S/P PLX  X3 . This morning patient is doing well. He is awake, alert and follows commands. He says he rested well last night and agrees that his weakness has improved significantly compared to yesterday.   Objective: Current vital signs: BP 125/50  Pulse 80  Temp 98.1 F (36.7 C) (Oral)  Resp 18  Ht $R'6\' 1"'lV$  (1.854 m)  Wt 85.8 kg (189 lb 2.5 oz)  BMI 24.96 kg/m2  SpO2 98% Vital signs in last 24 hours: Temp:  [96.9 F (36.1 C)-98.1 F (36.7 C)] 98.1 F (36.7 C) (09/28 1015) Pulse Rate:  [70-92] 80  (09/28 1015) Resp:  [13-21] 18  (09/28 1015) BP: (96-128)/(49-76) 125/50 mmHg (09/28 1015) SpO2:  [93 %-100 %] 98 % (09/28 1015) Weight:  [85.8 kg (189 lb 2.5 oz)] 85.8 kg (189 lb 2.5 oz) (09/28 0708)  Intake/Output from previous day: 09/27 0701 - 09/28 0700 In: 928 [P.O.:925; I.V.:3] Out: 552 [Urine:550; Stool:2] Intake/Output this shift: Total I/O In: -  Out: 975 [Urine:975] Nutritional status: General  Neurologic Exam: Mental Status: Alert, oriented, thought content appropriate.  Speech fluent without evidence of aphasia.  Able to follow 3 step commands without difficulty. Cranial Nerves: II: visual fields grossly normal, pupils equal, round, reactive to light and accommodation III,IV, VI: ptosis not present, extra-ocular motions intact bilaterally V,VII: smile symmetric, facial light touch sensation normal bilaterally VIII: hearing normal bilaterally IX,X: gag reflex present XI: trapezius strength/neck flexion strength normal bilaterally XII: tongue strength normal  Motor: Right : Upper extremity   5-/5    Left:     Upper extremity   5-/5  Lower extremity   -/5     Lower extremity   5-/5 Tone and bulk:normal tone throughout; no atrophy noted Sensory: Pinprick and light touch intact throughout, bilaterally Deep Tendon Reflexes: 0 and symmetric throughout Plantars: Right: downgoing   Left: downgoing Cerebellar: normal finger-to-nose,      Lab Results: Results for orders placed during the hospital encounter of 06/11/12 (from the past 48 hour(s))  BASIC METABOLIC PANEL     Status: Abnormal   Collection Time   06/16/12  4:15 PM      Component Value Range Comment   Sodium 137  135 - 145 mEq/L    Potassium 3.9  3.5 - 5.1 mEq/L    Chloride 101  96 - 112 mEq/L    CO2 28  19 - 32 mEq/L    Glucose, Bld 122 (*) 70 - 99 mg/dL    BUN 19  6 - 23 mg/dL    Creatinine, Ser 0.75  0.50 - 1.35 mg/dL    Calcium 8.6  8.4 - 10.5 mg/dL    GFR calc non Af Amer 88 (*) >90 mL/min    GFR calc Af Amer >90  >90 mL/min   CBC     Status: Abnormal   Collection Time   06/16/12  4:15 PM      Component Value Range Comment   WBC 5.4  4.0 - 10.5 K/uL    RBC 3.86 (*) 4.22 - 5.81 MIL/uL    Hemoglobin 12.4 (*) 13.0 - 17.0 g/dL    HCT 36.3 (*) 39.0 - 52.0 %    MCV 94.0  78.0 - 100.0 fL    MCH 32.1  26.0 - 34.0 pg    MCHC 34.2  30.0 - 36.0 g/dL    RDW 13.2  11.5 - 15.5 %    Platelets 122 (*) 150 -  400 K/uL     Recent Results (from the past 240 hour(s))  MRSA PCR SCREENING     Status: Normal   Collection Time   06/11/12  9:21 PM      Component Value Range Status Comment   MRSA by PCR NEGATIVE  NEGATIVE Final     Lipid Panel No results found for this basename: CHOL,TRIG,HDL,CHOLHDL,VLDL,LDLCALC in the last 72 hours  Studies/Results: No results found.  Medications:  Scheduled:   . therapeutic plasma exchange solution   Dialysis Q1 Hr x 3  . aspirin EC  81 mg Oral Daily  . citrate dextrose      . folic acid  1 mg Oral Daily  . heparin  1,000 Units Intracatheter Once  . PHENobarbital  97.2 mg Oral QHS  . phenytoin  100 mg Oral QHS  . polyethylene glycol  17 g Oral Daily  . polyethylene glycol  17 g Oral Once  . sodium chloride 0.9 % 500 mL with albumin human 25 g infusion   Intravenous Once  . sodium chloride  3 mL Intravenous Q12H  . sodium chloride  3 mL Intravenous Q12H  . DISCONTD: calcium-vitamin D  1 tablet Oral Q breakfast     Assessment/Plan:  Patient with CIDP  On PLX Has two more sessions of PLX  Tomorrow and on Tuesday C/W PT/OT  Brandon Ray V-P Donnie Mesa., MD., Ph.D.,MS 06/17/2012 1:28 PM

## 2012-06-17 NOTE — Progress Notes (Signed)
[facility] PROGRESS NOTE  Brandon Ray DXI:[id number] DOB: 07/06/1935 DOA: 06/11/2012 PCP: [name], MD  Assessment/Plan: Principal Problem:  *Chronic inflammatory demyelinating polyneuropathy Active Problems:  Coronary artery disease  Hyperlipidemia  Seizure disorder  Acute urinary retention  Neurogenic bladder  Chronic inflammatory demyelinating polyneuropathy with improved irritation of right arm.  Patient had BM yesterday and has increased strength of lower extremities.   He has completed 3/5 PLX treatments.   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  Had voiding trial but failed and had 1L of urine with foley placement during second episode of retention so catheter left in.  Per patient, bladder sensation has not improved. *Repeat voiding trial at end of PLX treatments.  Coronary artery disease asymptomatic and EKG without ischemic change -  Continue asas $RemoveBefor'81mg'JsPjBTQIlKGQ$  daily -  Will discuss with family addition of beta blocker or ACEI closer to discharge.  Will not add blood pressure medication while undergoing PLX unless BP persistently elevated.  Hyperlipidemia no evidence of myopathy or statin induced rhabdomyolysis, however, as statins can cause mild muscle weakness will hold for now and restart when no longer requiring frequent nerve/strength assessments (post PLX) -  Restart statin pending PLX completion  Seizure disorder Continue phenobarbital and Dilantin  Rales at bases:  Likely atelectasis from being in bed.  No fevers or leukocytosis. -  OOB BID -  Wife to provide Incentive spirometry -  Continue PT  Constipation, improving: -  Give extra dose of miralax today  DIET:  regular ACCESS:  Right IJ HemoSplit HD catheter placed 9/23 by IR IVF:  none PROPH:  SCDs  Code Status: Full code Family Communication:  Updated patient of plan of care Disposition Plan: Pending completion of PLX.  To inpatient rehabilitation.     Brief narrative:  77 year old male patient with primary underlying medical problems of coronary artery disease and seizure disorder. He is previously been treated for polyneuropathy of unknown etiology in May of this year. At that time he received IVIG but later developed a rash therefore this medication was discontinued. He once again presented to the hospital last week for increasing numbness in the right upper extremity. Evaluation at that time including MRI revealed neural encroachment at the C4-C5 and C6-C7 levels. He was discharged on 06/08/2012. He was evaluated by neurology during that admission and plans were to follow up in neurology after discharge. At time of discharge no new treatment modalities were initiated. After presentation to the emergency department this time the neurologist Dr. 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 According to the neurologist notes the patient had recently been evaluated by a nurse specialist at [facility] who offered the patient the choice of plasma exchange, IVIG her steroids. Unfortunately the patient had not yet had the chance to return back to this physician before having worsening of his symptoms. He did report to the neurologist that several weeks ago he had received multiple vaccinations and subsequently noticed worsening of his symptoms initially in the right leg and right arm but then also affecting the left leg. The symptoms have steadily been ascending and patient did not been able to function at home. He also endorsed constipation. Because of his significant neurological symptoms he was admitted to the step down unit for further evaluation and treatment. In addition  to a neurologist was recommending plasma exchange and has requested for interventional radiology to place a pheresis  catheter on 06/12/2012.   Consultants:  Neurology  Interventional Radiology  Procedures: Placement of a right IJ approach tunneled HemoSplit HD catheter for the purpose of plasmapheresis interventional radiology   Antibiotics:  None  HPI/Subjective:  Patient states that right arm feels less irritated and the strength in arm and legs is continues to improve.  He had several BM last night and had some emesis while straining to have BM.  He also had a cough productive of white sputum overnight. Denies fever, shortness of breath.  Objective: Filed Vitals:   06/16/12 1730 06/16/12 2215 06/17/12 0708 06/17/12 1015  BP: 111/49 122/49 116/54 125/50  Pulse: 89 84 78 80  Temp: 97.7 F (36.5 C) 97.5 F (36.4 C) 98.1 F (36.7 C) 98.1 F (36.7 C)  TempSrc: Temporal Oral Oral Oral  Resp: $Remo'15 16 16 18  'hFQlt$ Height:      Weight:   85.8 kg (189 lb 2.5 oz)   SpO2: 100% 93% 97% 98%    Intake/Output Summary (Last 24 hours) at 06/17/12 1214 Last data filed at 06/17/12 6759  Gross per 24 hour  Intake    378 ml  Output   1527 ml  Net  -1149 ml   Filed Weights   06/15/12 0548 06/16/12 0500 06/17/12 0708  Weight: 83.2 kg (183 lb 6.8 oz) 84.4 kg (186 lb 1.1 oz) 85.8 kg (189 lb 2.5 oz)    Exam:  General: No acute respiratory distress  HEENT:  MMM Lungs:  CTAB Cardiovascular: Regular rate and rhythm without murmur gallop or rub normal S1 and S2, 1+ extremity edema  Abdomen: Nontender, nondistended, soft, bowel sounds positive, no rebound, no ascites, no appreciable mass  Musculoskeletal: No significant cyanosis, clubbing of extremities  Neurological: bilateral lower extremity weakness 4+/5.  Right upper extremity weakness is negligible when tested extremities are 5/5. Unable to illicit lower extremity reflexes.  CN II-XII grossly intact. Psych:  A&Ox4  Data Reviewed: Basic Metabolic Panel:  Lab 16/38/46 1615 06/12/12 1434 06/11/12 1518  NA 137 140 139  K 3.9 4.3 4.1  CL 101 102 103    CO2 $Re'28 30 26  'PGX$ GLUCOSE 122* 101* 111*  BUN $Re'19 16 21  'fWH$ CREATININE 0.75 0.81 0.76  CALCIUM 8.6 9.5 9.1  MG -- -- --  PHOS -- -- --   Liver Function Tests:  Lab 06/12/12 1434 06/11/12 1518  AST 22 24  ALT 26 29  ALKPHOS 98 102  BILITOT 0.4 0.2*  PROT 6.6 6.7  ALBUMIN 3.6 3.6   No results found for this basename: LIPASE:5,AMYLASE:5 in the last 168 hours No results found for this basename: AMMONIA:5 in the last 168 hours CBC:  Lab 06/16/12 1615 06/12/12 1434 06/11/12 1518  WBC 5.4 5.3 4.2  NEUTROABS -- -- 2.4  HGB 12.4* 14.4 14.3  HCT 36.3* 41.8 40.5  MCV 94.0 94.1 92.9  PLT 122* 165 162   Cardiac Enzymes: No results found for this basename: CKTOTAL:5,CKMB:5,CKMBINDEX:5,TROPONINI:5 in the last 168 hours BNP (last 3 results) No results found for this basename: PROBNP:3 in the last 8760 hours CBG: No results found for this basename: GLUCAP:5 in the last 168 hours  Recent Results (from the past 240 hour(s))  MRSA PCR SCREENING     Status: Normal   Collection Time   06/11/12  9:21 PM      Component Value Range Status Comment   MRSA  by PCR NEGATIVE  NEGATIVE Final      Studies: No results found.  Scheduled Meds:    . therapeutic plasma exchange solution   Dialysis Q1 Hr x 3  . aspirin EC  81 mg Oral Daily  . calcium-vitamin D  1 tablet Oral Q breakfast  . citrate dextrose      . folic acid  1 mg Oral Daily  . heparin  1,000 Units Intracatheter Once  . PHENobarbital  97.2 mg Oral QHS  . phenytoin  100 mg Oral QHS  . polyethylene glycol  17 g Oral Daily  . sodium chloride 0.9 % 500 mL with albumin human 25 g infusion   Intravenous Once  . sodium chloride  3 mL Intravenous Q12H  . sodium chloride  3 mL Intravenous Q12H   Continuous Infusions:    . citrate dextrose 500 mL (06/14/12 0810)  . sodium chloride 0.9 % 500 mL with albumin human 25 g infusion 20 mL/hr at 06/12/12 1809    Principal Problem:  *Chronic inflammatory demyelinating polyneuropathy Active  Problems:  Coronary artery disease  Hyperlipidemia  Seizure disorder  Acute urinary retention  Neurogenic bladder    Time spent: 30    Saretta Dahlem, [facility] Pager [phone]. If 8PM-8AM, please contact night-coverage at [email or web address], password [facility] 06/17/2012, 12:14 PM  LOS: 6 days

## 2012-06-18 ENCOUNTER — Inpatient Hospital Stay (HOSPITAL_COMMUNITY): Payer: Medicare Other

## 2012-06-18 LAB — BASIC METABOLIC PANEL
CO2: 27 mEq/L (ref 19–32)
Calcium: 9 mg/dL (ref 8.4–10.5)
Chloride: 103 mEq/L (ref 96–112)
Sodium: 139 mEq/L (ref 135–145)

## 2012-06-18 LAB — CBC
HCT: 36.8 % — ABNORMAL LOW (ref 39.0–52.0)
MCV: 94.6 fL (ref 78.0–100.0)
Platelets: 124 10*3/uL — ABNORMAL LOW (ref 150–400)
RBC: 3.89 MIL/uL — ABNORMAL LOW (ref 4.22–5.81)
WBC: 6.8 10*3/uL (ref 4.0–10.5)

## 2012-06-18 MED ORDER — ACD FORMULA A 0.73-2.45-2.2 GM/100ML VI SOLN
Status: AC
  Filled 2012-06-18: qty 500

## 2012-06-18 MED ORDER — SODIUM CHLORIDE 0.9 % IV SOLN
INTRAVENOUS | Status: AC
  Filled 2012-06-18 (×3): qty 1000

## 2012-06-18 MED ORDER — POLYETHYLENE GLYCOL 3350 17 GM/SCOOP PO POWD
1.0000 | Freq: Once | ORAL | Status: DC
  Filled 2012-06-18: qty 255

## 2012-06-18 MED ORDER — CALCIUM CARBONATE ANTACID 500 MG PO CHEW
CHEWABLE_TABLET | ORAL | Status: AC
  Filled 2012-06-18: qty 2

## 2012-06-18 MED ORDER — SODIUM CHLORIDE 0.9 % IV SOLN
Freq: Once | INTRAVENOUS | Status: DC
  Filled 2012-06-18: qty 500

## 2012-06-18 MED ORDER — POLYETHYLENE GLYCOL 3350 17 G PO PACK
68.0000 g | PACK | Freq: Once | ORAL | Status: AC
Start: 2012-06-18 — End: 2012-06-18
  Administered 2012-06-18: 68 g via ORAL
  Filled 2012-06-18: qty 4

## 2012-06-18 MED ORDER — BISACODYL 5 MG PO TBEC: 10.0000 mg | DELAYED_RELEASE_TABLET | Freq: Every day | ORAL | Status: DC | PRN

## 2012-06-18 MED ORDER — POLYETHYLENE GLYCOL 3350 17 G PO PACK
68.0000 g | PACK | Freq: Once | ORAL | Status: DC
Start: 2012-06-18 — End: 2012-06-18

## 2012-06-18 NOTE — Progress Notes (Addendum)
Subjective:  Patient with CIDP  Today Plasma exchange X4.  No complications overnight  Objective: Current vital signs: BP 130/59  Pulse 76  Temp 98 F (36.7 C) (Oral)  Resp 18  Ht $R'6\' 1"'vX$  (1.854 m)  Wt 85.4 kg (188 lb 4.4 oz)  BMI 24.84 kg/m2  SpO2 96% Vital signs in last 24 hours: Temp:  [97.5 F (36.4 C)-98.1 F (36.7 C)] 98 F (36.7 C) (09/29 0600) Pulse Rate:  [76-85] 76  (09/29 0600) Resp:  [18] 18  (09/29 0600) BP: (120-130)/(50-59) 130/59 mmHg (09/29 0600) SpO2:  [96 %-100 %] 96 % (09/29 0600) Weight:  [85.4 kg (188 lb 4.4 oz)] 85.4 kg (188 lb 4.4 oz) (09/29 0600)  Intake/Output from previous day: 09/28 0701 - 09/29 0700 In: 483 [P.O.:480; I.V.:3] Out: 2425 [Urine:2425] Intake/Output this shift:   Nutritional status: General  Neurologic Exam: Alert, oriented, thought content appropriate. Speech fluent without evidence of aphasia. Able to follow 3 step commands without difficulty.  Cranial Nerves:  II: visual fields grossly normal, pupils equal, round, reactive to light and accommodation  III,IV, VI: ptosis not present, extra-ocular motions intact bilaterally  V,VII: smile symmetric, facial light touch sensation normal bilaterally  VIII: hearing normal bilaterally  IX,X: gag reflex present  XI: trapezius strength/neck flexion strength normal bilaterally  XII: tongue strength normal  Motor:  Right : Upper extremity 5-/5 Left: Upper extremity 5-/5  Lower extremity 4-/5                       Lower extremity 5-/5  Tone and bulk:normal tone throughout; no atrophy noted  Sensory: Pinprick and light touch intact throughout, bilaterally  Deep Tendon Reflexes: 0 and symmetric throughout  Plantars:  Right: downgoing Left: downgoing  Cerebellar:  normal finger-to-nose,   Lab Results: Results for orders placed during the hospital encounter of 06/11/12 (from the past 48 hour(s))  BASIC METABOLIC PANEL     Status: Abnormal   Collection Time   06/16/12  4:15 PM    Component Value Range Comment   Sodium 137  135 - 145 mEq/L    Potassium 3.9  3.5 - 5.1 mEq/L    Chloride 101  96 - 112 mEq/L    CO2 28  19 - 32 mEq/L    Glucose, Bld 122 (*) 70 - 99 mg/dL    BUN 19  6 - 23 mg/dL    Creatinine, Ser 0.75  0.50 - 1.35 mg/dL    Calcium 8.6  8.4 - 10.5 mg/dL    GFR calc non Af Amer 88 (*) >90 mL/min    GFR calc Af Amer >90  >90 mL/min   CBC     Status: Abnormal   Collection Time   06/16/12  4:15 PM      Component Value Range Comment   WBC 5.4  4.0 - 10.5 K/uL    RBC 3.86 (*) 4.22 - 5.81 MIL/uL    Hemoglobin 12.4 (*) 13.0 - 17.0 g/dL    HCT 36.3 (*) 39.0 - 52.0 %    MCV 94.0  78.0 - 100.0 fL    MCH 32.1  26.0 - 34.0 pg    MCHC 34.2  30.0 - 36.0 g/dL    RDW 13.2  11.5 - 15.5 %    Platelets 122 (*) 150 - 400 K/uL   BASIC METABOLIC PANEL     Status: Abnormal   Collection Time   06/18/12  5:15 AM  Component Value Range Comment   Sodium 139  135 - 145 mEq/L    Potassium 4.2  3.5 - 5.1 mEq/L    Chloride 103  96 - 112 mEq/L    CO2 27  19 - 32 mEq/L    Glucose, Bld 107 (*) 70 - 99 mg/dL    BUN 15  6 - 23 mg/dL    Creatinine, Ser 0.78  0.50 - 1.35 mg/dL    Calcium 9.0  8.4 - 10.5 mg/dL    GFR calc non Af Amer 86 (*) >90 mL/min    GFR calc Af Amer >90  >90 mL/min   CBC     Status: Abnormal   Collection Time   06/18/12  5:15 AM      Component Value Range Comment   WBC 6.8  4.0 - 10.5 K/uL    RBC 3.89 (*) 4.22 - 5.81 MIL/uL    Hemoglobin 12.6 (*) 13.0 - 17.0 g/dL    HCT 36.8 (*) 39.0 - 52.0 %    MCV 94.6  78.0 - 100.0 fL    MCH 32.4  26.0 - 34.0 pg    MCHC 34.2  30.0 - 36.0 g/dL    RDW 13.4  11.5 - 15.5 %    Platelets 124 (*) 150 - 400 K/uL     Recent Results (from the past 240 hour(s))  MRSA PCR SCREENING     Status: Normal   Collection Time   06/11/12  9:21 PM      Component Value Range Status Comment   MRSA by PCR NEGATIVE  NEGATIVE Final      A/P  Patient with CIDP,  Improved on Plasma Exchange.  C/W total of 5 Plasma exchange    PT/OT  Evan Osburn V-P Donnie Mesa., MD., Ph.D.,MS 06/18/2012 9:38 AM

## 2012-06-18 NOTE — Progress Notes (Signed)
[facility] PROGRESS NOTE  Brandon Ray ZOX:[id number] DOB: Jul 01, 1935 DOA: 06/11/2012 PCP: [name], MD  Assessment/Plan: Principal Problem:  *Chronic inflammatory demyelinating polyneuropathy Active Problems:  Coronary artery disease  Hyperlipidemia  Seizure disorder  Acute urinary retention  Neurogenic bladder  Chronic inflammatory demyelinating polyneuropathy with improved irritation of right arm.  Patient had BM yesterday and has increased strength of lower extremities.   He has completed 3/5 PLX treatments.   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  Had voiding trial but failed and had 1L of urine with foley placement during second episode of retention so catheter left in.  Per patient, bladder sensation has not improved. *Repeat voiding trial at end of PLX treatments.  Coronary artery disease asymptomatic and EKG without ischemic change -  Continue asas $RemoveBefor'81mg'HgmxUKOTIbGz$  daily -  Will discuss with family addition of beta blocker or ACEI closer to discharge.  Will not add blood pressure medication while undergoing PLX unless BP persistently elevated.  Hyperlipidemia no evidence of myopathy or statin induced rhabdomyolysis, however, as statins can cause mild muscle weakness will hold for now and restart when no longer requiring frequent nerve/strength assessments (post PLX) -  Restart statin pending PLX completion  Seizure disorder Continue phenobarbital and Dilantin  Rales at bases:  Resolved.  Likely atelectasis from being in bed.  No fevers or leukocytosis. -  OOB BID -  Continue PT -  IS  Constipation, becoming more distended  -  Change bisacodyl to PO instead of suppository -  Give extra dose of miralax today  DIET:  regular ACCESS:  Right IJ HemoSplit HD catheter placed 9/23 by IR IVF:  none PROPH:  SCDs  Code  Status: Full code Family Communication: Updated patient of plan of care Disposition Plan: Pending completion of PLX.  To inpatient rehabilitation.     Brief narrative:  77 year old male patient with primary underlying medical problems of coronary artery disease and seizure disorder. He is previously been treated for polyneuropathy of unknown etiology in May of this year. At that time he received IVIG but later developed a rash therefore this medication was discontinued. He once again presented to the hospital last week for increasing numbness in the right upper extremity. Evaluation at that time including MRI revealed neural encroachment at the C4-C5 and C6-C7 levels. He was discharged on 06/08/2012. He was evaluated by neurology during that admission and plans were to follow up in neurology after discharge. At time of discharge no new treatment modalities were initiated. After presentation to the emergency department this time the neurologist Dr. 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 According to the neurologist notes the patient had recently been evaluated by a nurse specialist at [facility] who offered the patient the choice of plasma exchange, IVIG her steroids. Unfortunately the patient had not yet had the chance to return back to this physician before having worsening of his symptoms. He did report to the neurologist that several weeks ago he had received multiple vaccinations and subsequently noticed worsening of his symptoms initially in the right leg and right arm but then also affecting the left leg. The symptoms have steadily been ascending and patient did not been able to function at home. He also endorsed constipation. Because of his significant neurological symptoms he was admitted to the step  down unit for further evaluation and treatment. In addition to a neurologist was recommending plasma exchange and has requested for  interventional radiology to place a pheresis catheter on 06/12/2012.   Consultants:  Neurology  Interventional Radiology  Procedures: Placement of a right IJ approach tunneled HemoSplit HD catheter for the purpose of plasmapheresis interventional radiology   Antibiotics:  None  HPI/Subjective:  Patient continues to feel that his legs are improving.  After two nights ago, he has not had further BMs. His cough is improved and he did not cough overnight last night or today.  Denies fever, shortness of breath.  Objective: Filed Vitals:   06/17/12 1500 06/17/12 2156 06/18/12 0600 06/18/12 1400  BP: 120/54 124/56 130/59 119/48  Pulse: 85 80 76 75  Temp: 97.5 F (36.4 C) 98.1 F (36.7 C) 98 F (36.7 C) 97.1 F (36.2 C)  TempSrc: Oral   Oral  Resp: $Remo'18 18 18 18  'PaBVY$ Height:      Weight:   85.4 kg (188 lb 4.4 oz)   SpO2: 99% 100% 96% 95%    Intake/Output Summary (Last 24 hours) at 06/18/12 1518 Last data filed at 06/18/12 0500  Gross per 24 hour  Intake    483 ml  Output   1450 ml  Net   -967 ml   Filed Weights   06/16/12 0500 06/17/12 0708 06/18/12 0600  Weight: 84.4 kg (186 lb 1.1 oz) 85.8 kg (189 lb 2.5 oz) 85.4 kg (188 lb 4.4 oz)    Exam:  General: No acute respiratory distress  HEENT:  MMM Lungs:  CTAB Cardiovascular: Regular rate and rhythm without murmur gallop or rub normal S1 and S2, trace extremity edema  Abdomen: Nontender, mildly distended, soft, bowel sounds positive Musculoskeletal: No significant cyanosis, clubbing of extremities  Neurological: Right lower extremity weakness 4+/5 with hip flexion and 4/5 foot dorsiflexion.  Left leg 5-/5 except foot dorsiflexion which is 4/5 also.  Right upper extremity weakness is negligible when tested. Unable to illicit lower extremity reflexes.  CN II-XII grossly intact. Psych:  A&Ox4  Data Reviewed: Basic Metabolic Panel:  Lab 25/42/70 0515 06/16/12 1615 06/12/12 1434  NA 139 137 140  K 4.2 3.9 4.3  CL 103 101  102  CO2 $Re'27 28 30  'GAM$ GLUCOSE 107* 122* 101*  BUN $Re'15 19 16  'hHh$ CREATININE 0.78 0.75 0.81  CALCIUM 9.0 8.6 9.5  MG -- -- --  PHOS -- -- --   Liver Function Tests:  Lab 06/12/12 1434  AST 22  ALT 26  ALKPHOS 98  BILITOT 0.4  PROT 6.6  ALBUMIN 3.6   No results found for this basename: LIPASE:5,AMYLASE:5 in the last 168 hours No results found for this basename: AMMONIA:5 in the last 168 hours CBC:  Lab 06/18/12 0515 06/16/12 1615 06/12/12 1434  WBC 6.8 5.4 5.3  NEUTROABS -- -- --  HGB 12.6* 12.4* 14.4  HCT 36.8* 36.3* 41.8  MCV 94.6 94.0 94.1  PLT 124* 122* 165   Cardiac Enzymes: No results found for this basename: CKTOTAL:5,CKMB:5,CKMBINDEX:5,TROPONINI:5 in the last 168 hours BNP (last 3 results) No results found for this basename: PROBNP:3 in the last 8760 hours CBG: No results found for this basename: GLUCAP:5 in the last 168 hours  Recent Results (from the past 240 hour(s))  MRSA PCR SCREENING     Status: Normal   Collection Time   06/11/12  9:21 PM      Component Value Range Status Comment   MRSA by PCR  NEGATIVE  NEGATIVE Final      Studies: No results found.  Scheduled Meds:    . aspirin EC  81 mg Oral Daily  . calcium carbonate      . citrate dextrose      . folic acid  1 mg Oral Daily  . heparin  1,000 Units Intracatheter Once  . mupirocin ointment   Topical BID  . PHENobarbital  97.2 mg Oral QHS  . phenytoin  100 mg Oral QHS  . polyethylene glycol  17 g Oral Daily  . polyethylene glycol  68 g Oral Once  . sodium chloride 0.9 % 1,000 mL with albumin human 50 g infusion   Intravenous Q1 Hr x 3  . sodium chloride 0.9 % 500 mL with albumin human 25 g infusion   Intravenous Once  . sodium chloride  3 mL Intravenous Q12H  . sodium chloride  3 mL Intravenous Q12H  . DISCONTD: sodium chloride 0.9 % 500 mL with albumin human 25 g infusion   Intravenous Once   Continuous Infusions:    . citrate dextrose 500 mL (06/14/12 0810)  . sodium chloride 0.9 % 500 mL  with albumin human 25 g infusion 20 mL/hr at 06/12/12 1809    Principal Problem:  *Chronic inflammatory demyelinating polyneuropathy Active Problems:  Coronary artery disease  Hyperlipidemia  Seizure disorder  Acute urinary retention  Neurogenic bladder    Time spent: 30    Reine Bristow, [facility] Pager [phone]. If 8PM-8AM, please contact night-coverage at [email or web address], password [facility] 06/18/2012, 3:18 PM  LOS: 7 days

## 2012-06-19 NOTE — Progress Notes (Signed)
Pt requested to take Miralax later today, states with the late treatment last night, and then being up several times during night for BM, he is fairly tired today.

## 2012-06-19 NOTE — Evaluation (Signed)
Occupational Therapy Evaluation Patient Details Name: Brandon Ray MRN: [id number] DOB: [birth year] Today's Date: 06/19/2012 Time: 9211-9417 OT Time Calculation (min): 42 min  OT Assessment / Plan / Recommendation Clinical Impression  Pt presents to OT with decreased I with ADL activity due to CIPD and cervical spine compression.  Pt will benefit from skilled OT to increase I with ADL activty and return to [facility]    OT Assessment  Patient needs continued OT Services    Follow Up Recommendations  Inpatient Rehab       Equipment Recommendations  None recommended by OT    Recommendations for Other Services Rehab consult  Frequency  Min 2X/week    Precautions / Restrictions Precautions Precautions: Fall       ADL  Grooming: Simulated;Moderate assistance Where Assessed - Grooming: Supported standing Upper Body Bathing: Simulated;Minimal assistance Where Assessed - Upper Body Bathing: Unsupported sitting Lower Body Bathing: Simulated;Maximal assistance Where Assessed - Lower Body Bathing: Supported sit to stand Upper Body Dressing: Simulated;Minimal assistance Where Assessed - Upper Body Dressing: Unsupported sitting Lower Body Dressing: Simulated;Maximal assistance Where Assessed - Lower Body Dressing: Supported sit to Lobbyist: Performed;Moderate assistance;Other (comment) (walk to bathroom with walker) Toilet Transfer Method: Sit to stand Toilet Transfer Equipment: Comfort height toilet;Grab bars Toileting - Clothing Manipulation and Hygiene: Performed;Maximal assistance Where Assessed - Camera operator Manipulation and Hygiene: Standing    OT Diagnosis: Generalized weakness  OT Problem List: Decreased strength;Impaired balance (sitting and/or standing);Impaired sensation OT Treatment Interventions: Self-care/ADL training;DME and/or AE instruction;Patient/family education   OT Goals Acute Rehab OT Goals OT Goal Formulation: With patient Time For Goal  Achievement: 06/15/12 Potential to Achieve Goals: Good ADL Goals Pt Will Perform Grooming: with modified independence;Standing at sink Pt Will Transfer to Toilet: with modified independence;Ambulation;Comfort height toilet;with DME Pt Will Perform Tub/Shower Transfer: Shower transfer;with modified independence;Ambulation;with DME;Shower seat with back        Prior [place] Lives With: Spouse Available Help at Discharge: Available 24 hours/day Type of Home: House Home Access: Stairs to enter [employer] of Steps: 1 Entrance Stairs-Rails: None Home Layout: Two level;Able to live on main level with bedroom/bathroom Bathroom Shower/Tub: Walk-in shower;Door [employer] Toilet: Standard Bathroom Accessibility: Yes How Accessible: Accessible via walker Home Adaptive Equipment: Built-in shower seat;Walker - rolling;Straight cane Prior Function Level of Independence: Independent Able to Take Stairs?: Yes Driving: Yes Communication Communication: No difficulties            Cognition  Overall Cognitive Status: Appears within functional limits for tasks assessed/performed Arousal/Alertness: Awake/alert Orientation Level: Appears intact for tasks assessed Behavior During Session: [facility] for tasks performed    Extremity/Trunk Assessment Right Upper Extremity Assessment RUE ROM/Strength/Tone: Deficits RUE ROM/Strength/Tone Deficits: grossly 4/5 RUE Sensation: Deficits RUE Sensation Deficits: reports diminished LT. Pt reports a tingly sensation in RUE Left Upper Extremity Assessment LUE ROM/Strength/Tone: WFL for tasks assessed Right Lower Extremity Assessment RLE Sensation: History of peripheral neuropathy;Deficits RLE Sensation Deficits: diminished LT, proprioception and pin prick sensation as well as increased hypersensitivity to dorsum of feet Left Lower Extremity Assessment LLE Sensation: History of peripheral neuropathy;Deficits LLE Sensation  Deficits: diminished LT, proprioception and pin prick sensation as well as increased hypersensitivity to dorsum of feet     Mobility Bed Mobility Bed Mobility: Sit to Supine Rolling Left: 4: Min assist Transfers Transfers: Sit to Stand;Stand to Sit Sit to Stand: 3: Mod assist;From chair/3-in-1;From toilet;With upper extremity assist Stand to Sit: To toilet;To bed;3: Mod  assist;With upper extremity assist              End of Session OT - End of Session Equipment Utilized During Treatment: Gait belt Activity Tolerance: Patient tolerated treatment well Patient left: in bed  GO     [place], Thereasa Parkin 06/19/2012, 3:18 PM

## 2012-06-19 NOTE — Progress Notes (Signed)
Physical Therapy Treatment Patient Details Name: Brandon Ray MRN: [id number] DOB: 06-02-1935 Today's Date: 06/19/2012 Time: 9518-8416 PT Time Calculation (min): 27 min  PT Assessment / Plan / Recommendation Comments on Treatment Session  Mr. Guerette is progressing well but continues to fatigue easily with gait and still requiring modA for transfers. Continue to recommend CIR for d/c.     Follow Up Recommendations  Post acute inpatient rehab    Barriers to Discharge        Equipment Recommendations  None recommended by PT    Recommendations for Other Services    Frequency     Plan Discharge plan remains appropriate;Frequency remains appropriate    Precautions / Restrictions Precautions Precautions: Fall       Mobility  Bed Mobility Bed Mobility: Supine to Sit Rolling Left: 4: Min assist Supine to Sit: 5: Supervision;HOB flat Details for Bed Mobility Assistance: cues for efficiency, improved fluidity of movement today Transfers Transfers: Sit to Stand;Stand to Sit Sit to Stand: With upper extremity assist (with rocking motion to generate momentum for sit>stand) Stand to Sit: 3: Mod assist;Without upper extremity assist;To chair/3-in-1 Details for Transfer Assistance: sit<>stand attempted from lower surface x2 however pt unable to achieve tall standing even with heavy maxA secondary to weak lower extremities, with elevated bed pt continues to need modA however able to clear buttocks and achieve tall standing with facilitation at sacrum/hips for extension and power up;  pt needing to stabilize using RW Ambulation/Gait Ambulation/Gait Assistance: 4: Min assist;3: Mod assist Ambulation Distance (Feet): 100 Feet Assistive device: Rolling walker Ambulation/Gait Assistance Details: minA for stability during ambulation needing facilitation for tall posture and improved control with stepping; pt with steppage gait on the right; as he fatigued pt's hip/knee buckled on the right  requiring min-modA to correct and pt became more impuslive as he fatigues needing increased cueing for safety Gait Pattern: Trunk flexed;Narrow base of support;Right steppage    Exercises General Exercises - Lower Extremity Hip ABduction/ADduction: AROM;Both;10 reps;20 reps;Seated (with yellow theraband and pillow) Toe Raises: AROM;Both;10 reps;Seated Heel Raises: AROM;Both;10 reps;Seated Mini-Sqauts: AROM;Both;10 reps;Standing    PT Goals Acute Rehab PT Goals PT Goal: Sit to Stand - Progress: Progressing toward goal PT Goal: Stand to Sit - Progress: Progressing toward goal PT Goal: Ambulate - Progress: Progressing toward goal PT Goal: Perform Home Exercise Program - Progress: Progressing toward goal  Visit Information  Last PT Received On: 06/19/12 Assistance Needed: +1    Subjective Data  Subjective: I walked to and from the bathroom this weekend and I did my exercises.    Cognition  Overall Cognitive Status: Appears within functional limits for tasks assessed/performed Arousal/Alertness: Awake/alert Orientation Level: Appears intact for tasks assessed Behavior During Session: [facility] for tasks performed    Balance  Static Standing Balance Static Standing - Level of Assistance: 4: Min assist;5: Stand by assistance Static Standing - Comment/# of Minutes: cues for hip and knee extension bilaterally and for tall posture, initially pt needing min-modA for stabilty as pt falls posteriorly with decreased ankle strategies Dynamic Standing Balance Dynamic Standing - Balance Support: No upper extremity supported Dynamic Standing - Level of Assistance: 4: Min assist;3: Mod assist Dynamic Standing - Balance Activities: Forward lean/weight shifting;Other (comment) (truck rotation with head turns) Dynamic Standing - Comments: facilitation at hips for anterior/posterior weight shift and cues for knee extension to improve ankle strategies and strengthening, LOB again sevel times posteriorly with  postural cues to correct with weight shift as opposed to grabbing  for objects  End of Session PT - End of Session Equipment Utilized During Treatment: Gait belt Activity Tolerance: Patient tolerated treatment well;Patient limited by fatigue Patient left: in chair;with call bell/phone within reach Nurse Communication: Mobility status   GP     [place] 06/19/2012, 4:00 PM

## 2012-06-19 NOTE — Progress Notes (Signed)
[facility] PROGRESS NOTE    SUBJECTIVE   No complaints. Legs strength has improved.   OBJECTIVE   Vital signs in last 24 hours: Temp:  [97.1 F (36.2 C)-98.8 F (37.1 C)] 97.9 F (36.6 C) (09/30 0604) Pulse Rate:  [75-89] 81  (09/30 0604) Resp:  [14-20] 18  (09/30 0604) BP: (108-137)/(41-71) 137/56 mmHg (09/30 0604) SpO2:  [95 %-100 %] 98 % (09/30 0604) Weight:  [87.317 kg (192 lb 8 oz)] 87.317 kg (192 lb 8 oz) (09/30 0748)  Intake/Output from previous day: 09/29 0701 - 09/30 0700 In: 480 [P.O.:480] Out: 600 [Urine:600] Intake/Output this shift:   Nutritional status: General  Past Medical History  Diagnosis Date  . Coronary artery disease   . Hyperlipidemia   . Seizure disorder   . Seizures   . Heart attack   . Fever   . Weakness   . Leg swelling     both legs  . Abdominal distension   . Abdominal pain   . Constipation   . Bruises easily   . Pleural effusion   . Small bowel obstruction   . Peripheral neuropathy     Neurologic ROS negative with exception of above. Musculoskeletal ROS negative  Neurologic Exam:   Mental Status: Alert, oriented, thought content appropriate.  Speech fluent without evidence of aphasia.  Able to follow 3 step commands without difficulty. Cranial Nerves: II: Discs flat bilaterally; Visual fields grossly normal, pupils equal, round, reactive to light and accommodation III,IV, VI: ptosis not present, extra-ocular motions intact bilaterally V,VII: smile symmetric, facial light touch sensation normal bilaterally VIII: hearing normal bilaterally IX,X: gag reflex present XI: bilateral shoulder shrug XII: midline tongue extension Motor: Right : Upper extremity   5/5    Left:     Upper extremity   5/5  Lower extremity   5/5     Lower extremity   5/5 Tone and bulk:normal tone throughout; no atrophy noted Sensory: Pinprick and light touch intact throughout, bilaterally  Deep  Tendon Reflexes: Absent in LE bilaterally, Minimal UE bilaterally Plantars: Right: downgoing   Left: downgoing Cerebellar: normal finger-to-nose,   CV: pulses palpable throughout    Lab Results: No results found for this basename: cbc, bmp, coags, chol, tri, ldl, hga1c   Lipid Panel No results found for this basename: CHOL,TRIG,HDL,CHOLHDL,VLDL,LDLCALC in the last 72 hours  Studies/Results: No results found.  Medications:     Scheduled:   . aspirin EC  81 mg Oral Daily  . calcium carbonate      . citrate dextrose      . folic acid  1 mg Oral Daily  . heparin  1,000 Units Intracatheter Once  . mupirocin ointment   Topical BID  . PHENobarbital  97.2 mg Oral QHS  . phenytoin  100 mg Oral QHS  . polyethylene glycol  17 g Oral Daily  . polyethylene glycol  68 g Oral Once  . sodium chloride 0.9 % 1,000 mL with albumin human 50 g infusion   Intravenous Q1 Hr x 3  . sodium chloride 0.9 % 500 mL with albumin human 25 g infusion   Intravenous Once  . sodium chloride  3 mL Intravenous Q12H  . sodium chloride  3 mL Intravenous Q12H  . DISCONTD: polyethylene glycol  68 g Oral Once  . DISCONTD: polyethylene glycol powder  1 Container Oral Once    Assessment/Plan:   Patient with CIDP.  He is scheduled for last PLX on 06/20/12 and then CIR. Continue with PT and OT.   No further recommendations.   Etta Quill PA-C [facility] [phone]  06/19/2012, 9:36 AM

## 2012-06-19 NOTE — Progress Notes (Signed)
[facility] PROGRESS NOTE  Brandon Ray ZOX:[id number] DOB: 02-Dec-1934 DOA: 06/11/2012 PCP: [name], MD  Assessment/Plan: Principal Problem:  *Chronic inflammatory demyelinating polyneuropathy Active Problems:  Coronary artery disease  Hyperlipidemia  Seizure disorder  Acute urinary retention  Neurogenic bladder  Chronic inflammatory demyelinating polyneuropathy with improved irritation of right arm and vastly improved strength of the lower extremities.  Patient had BM yesterday and has increased strength of lower extremities.   He has completed 4/5 PLX treatments.   *Appreciate neurology assistance  * Continue PLX x 5 treatments: last treatment on 10/1  Acute urinary retention/Neurogenic bladder , likely neurogenic from CIDP.  Had voiding trial but failed and had 1L of urine with foley placement during second episode of retention so catheter left in.  Per patient, bladder sensation has not improved. * Repeat voiding trial tomorrow  Coronary artery disease asymptomatic and EKG without ischemic change -  Continue asa $RemoveBefo'81mg'IhkjOQgzShc$  daily -  Will discuss with family addition of beta blocker or ACEI closer to discharge.  Will not add blood pressure medication while undergoing PLX unless BP persistently elevated.  Hyperlipidemia no evidence of myopathy or statin induced rhabdomyolysis, however, as statins can cause mild muscle weakness will hold for now and restart when no longer requiring frequent nerve/strength assessments (post PLX) -  Restart statin pending PLX completion  Seizure disorder Continue phenobarbital and Dilantin  Rales at bases:  Resolved.  Likely atelectasis from being in bed.  No fevers or leukocytosis. -  OOB BID -  Continue PT -  IS  Constipation, resolved and less distended today -  Change bisacodyl to PO instead of suppository  Anemia, normocytic and mild and thrombocytopenia, also mild, are both likely consequences of plasma exchange.  CBC should  be repeated in a week after last PLX treatment to ensure they are trending towards normal.    DIET:  regular ACCESS:  Right IJ HemoSplit HD catheter placed 9/23 by IR IVF:  none PROPH:  SCDs  Code Status: Full code Family Communication: Updated patient of plan of care Disposition Plan: Pending completion of PLX.  To inpatient rehabilitation.     Brief narrative:  77 year old male patient with primary underlying medical problems of coronary artery disease and seizure disorder. He is previously been treated for polyneuropathy of unknown etiology in May of this year. At that time he received IVIG but later developed a rash therefore this medication was discontinued. He once again presented to the hospital last week for increasing numbness in the right upper extremity. Evaluation at that time including MRI revealed neural encroachment at the C4-C5 and C6-C7 levels. He was discharged on 06/08/2012. He was evaluated by neurology during that admission and plans were to follow up in neurology after discharge. At time of discharge no new treatment modalities were initiated. After presentation to the emergency department this time the neurologist Dr. 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 According to the neurologist notes the patient had recently been evaluated by a nurse specialist at [facility] who offered the patient the choice of plasma exchange, IVIG her steroids. Unfortunately the patient had not yet had the chance to return back to this physician before having worsening of his symptoms. He did report to the neurologist that several weeks ago he had received multiple vaccinations and subsequently noticed worsening of his symptoms initially in the right leg and right arm but then also affecting the left leg. The symptoms  have steadily been ascending and patient did not been able to function at home. He also endorsed constipation. Because  of his significant neurological symptoms he was admitted to the step down unit for further evaluation and treatment. In addition to a neurologist was recommending plasma exchange and has requested for interventional radiology to place a pheresis catheter on 06/12/2012.   Consultants:  Neurology  Interventional Radiology  Procedures: Placement of a right IJ approach tunneled HemoSplit HD catheter for the purpose of plasmapheresis interventional radiology   Antibiotics:  None  HPI/Subjective:  Patient continues to feel that his legs are improving.  He has been able to walk to the bathroom with assistance and a rolling walker several times today.  He had plasma exchange late last night and then had several bowel movements overnight after receiving extra miralax last night.  Has intermittent small areas of "fire" sensation on his left arm, but much better compared to prior.  Objective: Filed Vitals:   06/18/12 2252 06/18/12 2300 06/19/12 0604 06/19/12 0748  BP: 120/41 110/43 137/56   Pulse: 82 83 81   Temp: 98.8 F (37.1 C)  97.9 F (36.6 C)   TempSrc: Oral  Oral   Resp: $Remo'16 14 18   'UQZkS$ Height:      Weight:    87.317 kg (192 lb 8 oz)  SpO2: 100% 98% 98%     Intake/Output Summary (Last 24 hours) at 06/19/12 1707 Last data filed at 06/19/12 1539  Gross per 24 hour  Intake   1080 ml  Output    600 ml  Net    480 ml   Filed Weights   06/17/12 0708 06/18/12 0600 06/19/12 0748  Weight: 85.8 kg (189 lb 2.5 oz) 85.4 kg (188 lb 4.4 oz) 87.317 kg (192 lb 8 oz)    Exam:  General: No acute respiratory distress  HEENT:  MMM Lungs:  CTAB Cardiovascular: Regular rate and rhythm without murmur gallop or rub normal S1 and S2, trace extremity edema  Abdomen: Nontender, mildly distended, soft, bowel sounds positive Musculoskeletal: No significant cyanosis, clubbing of extremities  Neurological: Right lower extremity weakness 4+/5 with hip flexion and 4/5 foot dorsiflexion.  Left leg 5-/5  except foot dorsiflexion which is 4/5 also.  BUE 5/5. Unable to illicit lower extremity reflexes.  CN II-XII grossly intact. Psych:  A&Ox4  Data Reviewed: Basic Metabolic Panel:  Lab 36/64/40 0515 06/16/12 1615  NA 139 137  K 4.2 3.9  CL 103 101  CO2 27 28  GLUCOSE 107* 122*  BUN 15 19  CREATININE 0.78 0.75  CALCIUM 9.0 8.6  MG -- --  PHOS -- --   Liver Function Tests: No results found for this basename: AST:5,ALT:5,ALKPHOS:5,BILITOT:5,PROT:5,ALBUMIN:5 in the last 168 hours No results found for this basename: LIPASE:5,AMYLASE:5 in the last 168 hours No results found for this basename: AMMONIA:5 in the last 168 hours CBC:  Lab 06/18/12 0515 06/16/12 1615  WBC 6.8 5.4  NEUTROABS -- --  HGB 12.6* 12.4*  HCT 36.8* 36.3*  MCV 94.6 94.0  PLT 124* 122*   Cardiac Enzymes: No results found for this basename: CKTOTAL:5,CKMB:5,CKMBINDEX:5,TROPONINI:5 in the last 168 hours BNP (last 3 results) No results found for this basename: PROBNP:3 in the last 8760 hours CBG: No results found for this basename: GLUCAP:5 in the last 168 hours  Recent Results (from the past 240 hour(s))  MRSA PCR SCREENING     Status: Normal   Collection Time   06/11/12  9:21 PM  Component Value Range Status Comment   MRSA by PCR NEGATIVE  NEGATIVE Final      Studies: No results found.  Scheduled Meds:    . aspirin EC  81 mg Oral Daily  . calcium carbonate      . citrate dextrose      . folic acid  1 mg Oral Daily  . heparin  1,000 Units Intracatheter Once  . mupirocin ointment   Topical BID  . PHENobarbital  97.2 mg Oral QHS  . phenytoin  100 mg Oral QHS  . polyethylene glycol  17 g Oral Daily  . polyethylene glycol  68 g Oral Once  . sodium chloride 0.9 % 500 mL with albumin human 25 g infusion   Intravenous Once  . sodium chloride  3 mL Intravenous Q12H  . sodium chloride  3 mL Intravenous Q12H   Continuous Infusions:    . citrate dextrose 500 mL (06/14/12 0810)  . sodium chloride  0.9 % 500 mL with albumin human 25 g infusion 20 mL/hr at 06/12/12 1809    Principal Problem:  *Chronic inflammatory demyelinating polyneuropathy Active Problems:  Coronary artery disease  Hyperlipidemia  Seizure disorder  Acute urinary retention  Neurogenic bladder    Time spent: 30    Aiken Withem, [place] [facility] Pager [phone]. If 8PM-8AM, please contact night-coverage at [email or web address], password [facility] 06/19/2012, 5:07 PM  LOS: 8 days

## 2012-06-19 NOTE — Progress Notes (Signed)
Met with pt and his wife at bedside this AM to discuss CIR. Pt would benefit from inpatient rehab and they are in agreement with plan to come to CIR when medically ready. Have begun insurance approval process, clinicals faxed and await notification from [facility]. Please call for questions: [phone].

## 2012-06-20 ENCOUNTER — Inpatient Hospital Stay (HOSPITAL_COMMUNITY)
Admission: RE | Admit: 2012-06-20 | Discharge: 2012-06-27 | DRG: 945 | Disposition: A | Discharge status: Home Health | Payer: Medicare Other | Source: Ambulatory Visit | Attending: Physical Medicine & Rehabilitation | Admitting: Physical Medicine & Rehabilitation

## 2012-06-20 ENCOUNTER — Inpatient Hospital Stay (HOSPITAL_COMMUNITY): Payer: Medicare Other

## 2012-06-20 DIAGNOSIS — G40909 Epilepsy, unspecified, not intractable, without status epilepticus: Secondary | ICD-10-CM | POA: Diagnosis present

## 2012-06-20 DIAGNOSIS — I2510 Atherosclerotic heart disease of native coronary artery without angina pectoris: Secondary | ICD-10-CM | POA: Diagnosis present

## 2012-06-20 DIAGNOSIS — Z87891 Personal history of nicotine dependence: Secondary | ICD-10-CM

## 2012-06-20 DIAGNOSIS — Z951 Presence of aortocoronary bypass graft: Secondary | ICD-10-CM

## 2012-06-20 DIAGNOSIS — R339 Retention of urine, unspecified: Secondary | ICD-10-CM | POA: Diagnosis present

## 2012-06-20 DIAGNOSIS — Z5189 Encounter for other specified aftercare: Principal | ICD-10-CM

## 2012-06-20 DIAGNOSIS — E785 Hyperlipidemia, unspecified: Secondary | ICD-10-CM | POA: Diagnosis present

## 2012-06-20 DIAGNOSIS — K5900 Constipation, unspecified: Secondary | ICD-10-CM

## 2012-06-20 DIAGNOSIS — Z7982 Long term (current) use of aspirin: Secondary | ICD-10-CM

## 2012-06-20 DIAGNOSIS — G6181 Chronic inflammatory demyelinating polyneuritis: Secondary | ICD-10-CM

## 2012-06-20 DIAGNOSIS — N319 Neuromuscular dysfunction of bladder, unspecified: Secondary | ICD-10-CM | POA: Diagnosis present

## 2012-06-20 LAB — BASIC METABOLIC PANEL
BUN: 14 mg/dL (ref 6–23)
GFR calc Af Amer: >90 mL/min (ref >90)
GFR calc non Af Amer: >90 mL/min (ref >90)
Potassium: 3.9 mEq/L (ref 3.5–5.1)
Sodium: 138 mEq/L (ref 135–145)

## 2012-06-20 LAB — CBC
HCT: 35.4 % — ABNORMAL LOW (ref 39.0–52.0)
MCHC: 34.5 g/dL (ref 30.0–36.0)
Platelets: 126 10*3/uL — ABNORMAL LOW (ref 150–400)
RDW: 13.5 % (ref 11.5–15.5)

## 2012-06-20 MED ORDER — ACD FORMULA A 0.73-2.45-2.2 GM/100ML VI SOLN
Status: AC
  Administered 2012-06-20: 14:00:00
  Filled 2012-06-20: qty 500

## 2012-06-20 MED ORDER — SENNA 8.6 MG PO TABS
1.0000 | ORAL_TABLET | Freq: Two times a day (BID) | ORAL | Status: DC
  Administered 2012-06-20 – 2012-06-27 (×14): 8.6 mg via ORAL
  Filled 2012-06-20 (×15): qty 1

## 2012-06-20 MED ORDER — ATORVASTATIN CALCIUM 20 MG PO TABS
20.0000 mg | ORAL_TABLET | Freq: Every day | ORAL | Status: DC
  Administered 2012-06-20 – 2012-06-26 (×7): 20 mg via ORAL
  Filled 2012-06-20 (×8): qty 1

## 2012-06-20 MED ORDER — ACETAMINOPHEN 325 MG PO TABS
325.0000 mg | ORAL_TABLET | ORAL | Status: DC | PRN
  Administered 2012-06-22 – 2012-06-25 (×3): 650 mg via ORAL
  Filled 2012-06-20 (×3): qty 2

## 2012-06-20 MED ORDER — POLYETHYLENE GLYCOL 3350 17 G PO PACK
17.0000 g | PACK | Freq: Every day | ORAL | Status: DC
  Administered 2012-06-24 – 2012-06-27 (×4): 17 g via ORAL
  Filled 2012-06-20 (×8): qty 1

## 2012-06-20 MED ORDER — CALCIUM CARBONATE ANTACID 500 MG PO CHEW
CHEWABLE_TABLET | ORAL | Status: AC
  Administered 2012-06-20: 400 mg
  Filled 2012-06-20: qty 2

## 2012-06-20 MED ORDER — ADULT MULTIVITAMIN W/MINERALS CH
1.0000 | ORAL_TABLET | Freq: Every day | ORAL | Status: DC
  Administered 2012-06-20 – 2012-06-27 (×8): 1 via ORAL
  Filled 2012-06-20 (×9): qty 1

## 2012-06-20 MED ORDER — ONDANSETRON HCL 4 MG/2ML IJ SOLN: 4.0000 mg | Freq: Four times a day (QID) | INTRAMUSCULAR | Status: DC | PRN

## 2012-06-20 MED ORDER — SODIUM CHLORIDE 0.9 % IV SOLN
INTRAVENOUS | Status: AC
  Administered 2012-06-20: 14:00:00 via INTRAVENOUS_CENTRAL
  Filled 2012-06-20: qty 200

## 2012-06-20 MED ORDER — SORBITOL 70 % SOLN: 30.0000 mL | Freq: Every day | Status: DC | PRN

## 2012-06-20 MED ORDER — FOLIC ACID 1 MG PO TABS
1.0000 mg | ORAL_TABLET | Freq: Every day | ORAL | Status: DC
  Administered 2012-06-21 – 2012-06-27 (×7): 1 mg via ORAL
  Filled 2012-06-20 (×8): qty 1

## 2012-06-20 MED ORDER — MUPIROCIN 2 % EX OINT
TOPICAL_OINTMENT | Freq: Two times a day (BID) | CUTANEOUS | Status: DC
  Administered 2012-06-20 – 2012-06-26 (×13): via TOPICAL
  Filled 2012-06-20: qty 22

## 2012-06-20 MED ORDER — SODIUM CHLORIDE 0.9 % IV SOLN
INTRAVENOUS | Status: AC
  Filled 2012-06-20 (×2): qty 200

## 2012-06-20 MED ORDER — ONDANSETRON HCL 4 MG PO TABS: 4.0000 mg | ORAL_TABLET | Freq: Four times a day (QID) | ORAL | Status: DC | PRN

## 2012-06-20 MED ORDER — PHENYTOIN SODIUM EXTENDED 100 MG PO CAPS
100.0000 mg | ORAL_CAPSULE | Freq: Every day | ORAL | Status: DC
  Administered 2012-06-20 – 2012-06-26 (×7): 100 mg via ORAL
  Filled 2012-06-20 (×8): qty 1

## 2012-06-20 MED ORDER — ASPIRIN EC 81 MG PO TBEC
81.0000 mg | DELAYED_RELEASE_TABLET | Freq: Every day | ORAL | Status: DC
  Administered 2012-06-21 – 2012-06-27 (×7): 81 mg via ORAL
  Filled 2012-06-20 (×8): qty 1

## 2012-06-20 MED ORDER — PHENOBARBITAL 97.2 MG PO TABS
97.2000 mg | ORAL_TABLET | Freq: Every day | ORAL | Status: DC
  Administered 2012-06-20 – 2012-06-26 (×7): 97.2 mg via ORAL
  Filled 2012-06-20 (×7): qty 1

## 2012-06-20 MED ORDER — CALCIUM CARBONATE ANTACID 500 MG PO CHEW
2.0000 | CHEWABLE_TABLET | ORAL | Status: DC | PRN
  Administered 2012-06-22: 400 mg via ORAL
  Filled 2012-06-20: qty 2

## 2012-06-20 MED ORDER — OMEGA-3-ACID ETHYL ESTERS 1 G PO CAPS
1.0000 g | ORAL_CAPSULE | Freq: Two times a day (BID) | ORAL | Status: DC
  Administered 2012-06-20 – 2012-06-27 (×14): 1 g via ORAL
  Filled 2012-06-20 (×16): qty 1

## 2012-06-20 NOTE — H&P (View-Only) (Signed)
Physical Medicine and Rehabilitation Admission H&P    Chief Complaint  Patient presents with  . Weakness  . Numbness  : HPI: Brandon Ray is a 77 y.o. right-handed male with history of coronary artery disease, CABG, seizure disorder and progressive polyneuropathy for which was initially treated in May of this year and followed by Dr. Terrace Arabia of neurology services with IVIG and subsequent developed some rash and was discontinued. Presented 06/11/2012 with increasing numbness and weakness right upper extremity. Recent MRI of the brain showed mild chronic microvascular ischemic change no acute abnormality and MRA of the head negative. MRI lumbar spine without significant nerve root compression and cervical spine MRI with multilevel spondylosis again without significant cord compression . 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 Noted bouts of urinary retention with increased post void residuals a Foley catheter tube had been in place and attempt voiding trial. He remains on Dilantin and phenobarbital for history of seizure disorder. Physical therapy evaluation completed 06/15/2012 with recommendations of physical medicine and rehabilitation consult to consider inpatient rehabilitation services. Patient was felt to be a good candidate for inpatient rehabilitation services and was admitted for comprehensive rehabilitation program  Review of Systems  Eyes: Negative for double vision.  Musculoskeletal: Positive for myalgias.  Skin: Positive for rash.  Neurological: Positive for tingling, seizures and weakness.  All other systems reviewed and are negative   Past Medical History  Diagnosis Date  . Coronary artery disease   . Hyperlipidemia   . Seizure disorder   . Seizures   . Heart attack   . Fever   . Weakness   . Leg swelling     both legs  . Abdominal distension   . Abdominal pain   . Constipation   . Bruises easily   .  Pleural effusion   . Small bowel obstruction   . Peripheral neuropathy    Past Surgical History  Procedure Date  . Cardiac catheterization 01/27/2003    NORMAL LEFT VENTRICULAR SIZE WITH MILD FOCAL LATERAL WALL HYPOKINESIA. EF 60%  . Coronary artery bypass graft 2004    LIMA GRAFT TO THE LAD, SAPHENOUS VEIN GRAFT TO THE DIAGONAL, SAPHENOUS VEIN GRAFT TO THE FIRST OM, SAPHENOUS VEIN GRAFT TO THE PDA  . Transurethral resection of prostate   . Hand surgery 2000    left  . Abdominal surgery 05/2011    bowel obstruction  . Ankle fracture surgery 1997    left   Family History  Problem Relation Age of Onset  . Asthma Mother   . Heart disease Mother   . Coronary artery disease Father   . Heart disease Father    Social History:  reports that he quit smoking about 47 years ago. His smoking use included Cigarettes. He has a 5 pack-year smoking history. He has never used smokeless tobacco. He reports that he drinks alcohol. He reports that he does not use illicit drugs. Allergies:  Allergies  Allergen Reactions  . Amoxicillin Rash    Upper torso only.   Medications Prior to Admission  Medication Sig Dispense Refill  . aspirin 81 MG tablet Take 81 mg by mouth daily.       Marland Kitchen atorvastatin (LIPITOR) 20 MG tablet Take 20 mg by mouth daily.      . calcium-vitamin D (OSCAL WITH D) 500-200 MG-UNIT per tablet Take 1 tablet by mouth daily.        . Cholecalciferol (VITAMIN D) 2000 UNITS  tablet Take 2,000 Units by mouth daily.        . Coenzyme Q10 (CO Q-10 PO) Take 1 tablet by mouth daily.      . folic acid (FOLVITE) 833 MCG tablet Take 800 mcg by mouth daily.       . Multiple Vitamin (MULTIVITAMIN) tablet Take 1 tablet by mouth daily.        . Multiple Vitamins-Minerals (PRESERVISION AREDS PO) Take 1 tablet by mouth 2 (two) times daily.       . Omega-3 Fatty Acids (FISH OIL) 1200 MG CAPS Take 1,200 mg by mouth daily.      Marland Kitchen PHENobarbital (LUMINAL) 97.2 MG tablet Take 97.2 mg by mouth daily.          . phenytoin (DILANTIN) 100 MG ER capsule Take 100 mg by mouth daily.       . polyethylene glycol (MIRALAX / GLYCOLAX) packet Take 17 g by mouth daily.        Home: Home Living Lives With: Spouse Available Help at Discharge: Available 24 hours/day Type of Home: House Home Access: Stairs to enter [employer] of Steps: 1 Entrance Stairs-Rails: None Home Layout: Two level;Able to live on main level with bedroom/bathroom Bathroom Shower/Tub: Walk-in shower;Door [employer] Toilet: Standard Bathroom Accessibility: Yes How Accessible: Accessible via walker Home Adaptive Equipment: Built-in shower seat;Walker - rolling;Straight cane   Functional History: Prior Function Able to Take Stairs?: Yes Driving: Yes Comments: likes photography, walking and hiking. Very active prior to last admission. For the past two weeks ambulation has been getting much harder for him because of weakness  Functional Status:  Mobility: Bed Mobility Bed Mobility: Supine to Sit Rolling Left: 4: Min assist Left Sidelying to Sit: 4: Min guard;With rails Supine to Sit: 5: Supervision;HOB flat Sitting - Scoot to Edge of Bed: 4: Min guard Sit to Supine: 4: Min assist Scooting to [facility]: 6: Modified independent (Device/Increase time) Transfers Transfers: Sit to Stand;Stand to Sit Sit to Stand: With upper extremity assist (with rocking motion to generate momentum for sit>stand) Sit to Stand: Patient Percentage: 60% Stand to Sit: 3: Mod assist;Without upper extremity assist;To chair/3-in-1 Ambulation/Gait Ambulation/Gait Assistance: 4: Min assist;3: Mod assist Ambulation Distance (Feet): 100 Feet Assistive device: Rolling walker Ambulation/Gait Assistance Details: minA for stability during ambulation needing facilitation for tall posture and improved control with stepping; pt with steppage gait on the right; as he fatigued pt's hip/knee buckled on the right requiring min-modA to correct and pt became more  impuslive as he fatigues needing increased cueing for safety Gait Pattern: Trunk flexed;Narrow base of support;Right steppage General Gait Details: genu valgus knee posture, decreased muscular control of RLE with swing, heavy use of upper extremities on RW secondary to weak lower extremities    ADL: ADL Grooming: Simulated;Moderate assistance Where Assessed - Grooming: Supported standing Upper Body Bathing: Simulated;Minimal assistance Where Assessed - Upper Body Bathing: Unsupported sitting Lower Body Bathing: Simulated;Maximal assistance Where Assessed - Lower Body Bathing: Supported sit to stand Upper Body Dressing: Simulated;Minimal assistance Where Assessed - Upper Body Dressing: Unsupported sitting Lower Body Dressing: Simulated;Maximal assistance Where Assessed - Lower Body Dressing: Supported sit to Lobbyist: Performed;Moderate assistance;Other (comment) (walk to bathroom with walker) Toilet Transfer Method: Sit to stand Toilet Transfer Equipment: Comfort height toilet;Grab bars  Cognition: Cognition Arousal/Alertness: Awake/alert Orientation Level: Oriented X4 Cognition Overall Cognitive Status: Appears within functional limits for tasks assessed/performed Arousal/Alertness: Awake/alert Orientation Level: Appears intact for tasks assessed Behavior During Session: [employer] for tasks performed  Cognition - Other Comments: Very pleasant intelligent gentleman   Blood pressure 110/49, pulse 83, temperature 98.2 F (36.8 C), temperature source Oral, resp. rate 18, height $RemoveBe'6\' 1"'[id number]$  (1.854 m), weight 86.8 kg (191 lb 5.8 oz), SpO2 97.00%. Physical Exam  Vitals reviewed.  Constitutional: He is oriented to person, place, and time. He appears well-developed.  HENT:  Head: Normocephalic.  Eyes:  Pupils round and reactive to light  Neck: Neck supple. No thyromegaly present. Right IJ catheter in place Cardiovascular: Normal rate and regular rhythm.  Pulmonary/Chest: Effort  normal and breath sounds normal. No respiratory distress. He has no wheezes.  Abdominal: Bowel sounds are normal. He exhibits no distension. There is no tenderness.  Musculoskeletal: He exhibits no edema.  Neurological: He is alert and oriented to person, place, and time. No cranial nerve deficit.  UE strength grossly 4-5/5. LE grossly 3+/5 prox to 3+ to 4 distally. Stocking glove sensory loss in both limbs below the knees which has improved from my last exam. Mild sensory loss over the distal right hand. DTR's are trace  Skin: Skin is warm and dry.  Psychiatric: He has a normal mood and affect. His behavior is normal. Judgment and thought content normal   No results found for this or any previous visit (from the past 48 hour(s)). No results found.  Post Admission Physician Evaluation: 1. Functional deficits secondary  to CIDP with primarily lower extremity weakness and sensory loss. 2. Patient is admitted to receive collaborative, interdisciplinary care between the physiatrist, rehab nursing staff, and therapy team. 3. Patient's level of medical complexity and substantial therapy needs in context of that medical necessity cannot be provided at a lesser intensity of care such as a SNF. 4. Patient has experienced substantial functional loss from his/her baseline which was documented above under the "Functional History" and "Functional Status" headings.  Judging by the patient's diagnosis, physical exam, and functional history, the patient has potential for functional progress which will result in measurable gains while on inpatient rehab.  These gains will be of substantial and practical use upon discharge  in facilitating mobility and self-care at the household level. 5. Physiatrist will provide 24 hour management of medical needs as well as oversight of the therapy plan/treatment and provide guidance as appropriate regarding the interaction of the two. 6. 24 hour rehab nursing will assist with  bladder management, bowel management, safety, skin/wound care, disease management, medication administration, pain management and patient education  and help integrate therapy concepts, techniques,education, etc. 7. PT will assess and treat for:  fxnl mobility, NMR, adaptive equipment, safety.  Goals are: mod I. 8. OT will assess and treat for: Upper ext strength, ROM, fxnl mobility, adaptive equipment, ADL's.   Goals are: mod I to supervision. 9. SLP will assess and treat for: n/a.  Goals are: n/a. 10. Case Management and Social Worker will assess and treat for psychological issues and discharge planning. 11. Team conference will be held weekly to assess progress toward goals and to determine barriers to discharge. 12. Patient will receive at least 3 hours of therapy per day at least 5 days per week. 13. ELOS: 7 days      Prognosis:  excellent   Medical Problem List and Plan: 1. Chronic inflammatory demyelinating polyneuropathy. 5 treatments of plasmapheresis completed 06/20/2012 2. DVT Prophylaxis/Anticoagulation: SCDs. Monitor for signs of DVT 3. urinary retention/neurogenic bladder. Voiding trial underway. Check PVRs x3. Consider adding Flomax and/or Urecholine if needed.. Need to be cautious with noted history of  coronary artery disease 4. Constipation. Adjust bowel program 5. Neuropsych: This patient is capable of making decisions on his/her own behalf. 6. CAD status post CABG 2004. Continue aspirin therapy. Patient no chest pain or shortness of breath 7. Seizure disorder. Dilantin 100 mg each bedtime and phenobarbital 97.2 mg each bedtime. Patient no recent seizure activity  06/20/2012, Oval Linsey, MD

## 2012-06-20 NOTE — Progress Notes (Signed)
Patient arrived to rehab via bed from [place]. Oriented patient to plan of care, rehab routine, and current medications, and butterfly wish.Brandon Ray

## 2012-06-20 NOTE — Progress Notes (Signed)
Foley cath removed per order.  No difficulties.

## 2012-06-20 NOTE — Progress Notes (Signed)
Have received approval from pts insurance for admission to CIR today. Pt and wife in agreement with plan to come to rehab after his final PLX treatment today. Pt's CM and MD aware. Please call for questions: [phone].

## 2012-06-20 NOTE — Interval H&P Note (Signed)
Brandon Ray was admitted today to Inpatient Rehabilitation with the diagnosis of CIDP.  The patient's history has been reviewed, patient examined, and there is no change in status.  Patient continues to be appropriate for intensive inpatient rehabilitation.  I have reviewed the patient's chart and labs.  Questions were answered to the patient's satisfaction.  SWARTZ,ZACHARY T 06/20/2012, 8:51 PM

## 2012-06-20 NOTE — Discharge Summary (Addendum)
Physician Discharge Summary  Brandon Ray ZOX:[id number] DOB: [birth year] DOA: 06/11/2012  PCP: [name], MD  Admit date: 06/11/2012 Discharge date: 06/20/2012  Recommendations for Outpatient Follow-up:  1. To inpatient rehabilitation for ongoing PT and OT 2. Needs IR removal of his tunneled catheter, please consult IR for removal.   3. Repeat CBC in 1 week by primary care doctor to follow up mild anemia, thrombocytopenia, and leukopenia, all likely related to PLX.    Discharge Diagnoses:  Principal Problem:  *Chronic inflammatory demyelinating polyneuropathy Active Problems:  Coronary artery disease  Hyperlipidemia  Seizure disorder  Acute urinary retention  Neurogenic bladder  Constipation   Discharge Condition: stable, improved  Diet recommendation: healthy heart  Wt Readings from Last 3 Encounters:  06/20/12 85.5 kg (188 lb 7.9 oz)  06/20/12 86.8 kg (191 lb 5.8 oz)  06/07/12 84.6 kg (186 lb 8.2 oz)    History of present illness:   77 year old male with history of CAD status post CABG, seizure disorder, hyperlipidemia and polyneuropathy for which patient was initially treated in May of this year with IVIG and subsequently developed some rash and was discontinued presented last week with complaints of increasing numbness in the right upper extremity. Patient had MRI of the brain and C-spine with MRA of the brain. MRI C-spine was showing neural encroachment at C4-C5 and C6-C7 levels. He was discharged home without any new treatment. Neurologist Dr. Leonel Ramsay has already evaluated the patient and feels patient's weakness may be related to the CIDP and that we should attempt to get office records and start plasmapheresis   Hospital Course:   Chronic inflammatory demyelinating polyneuropathy:  Mr. Carrico was admitted with burning and weakness of the right upper extremity, urinary and stool retention, and bilateral lower extremity weakness.  He had rapid improvement  of strength and pain sensation after starting plasma exchange.  He completed 5 rounds of PLX which was done every other day, last day on 81/1, without complication.  He was able to ambulate with walker at the time of discharge.  He should continue aggressive PT and OT at inpatient rehabilitation and follow up with Dr. Leonel Ramsay within 2 weeks.     Acute urinary retention/Neurogenic bladder, likely neurogenic from CIDP.  Early in hospitalization, he had a voiding trial which he failed and after reinsertion of foley catheter, he had 1L of urine output.  Outcome was similar with a second voiding trial, so the foley catheter was left in.  He repeated a voiding trial on 10/1 and he was able to void without difficulty.    Constipation, acute and likely neurogenic from CIDP.  After aggressive miralax and bisacodyl, he started having bowel movements.  He should continue a bowel regimen to prevent constipation as he continues to recover.     Coronary artery disease asymptomatic and EKG without ischemic change.  He continued daily aspirin $RemoveBefor'81mg'[id number]$ .  Because of stable low blood pressures and ongoing PLX, his beta blocker and ACEI were held, but should be resumed at discharge.   Hyperlipidemia no evidence of myopathy or statin induced rhabdomyolysis, however, as statins can cause mild muscle weakness, his statin was held during hospitalization and should be resumed at discharge.   Seizure disorder He had no seizures during admission and continued phenobarbital and dilantin.  He should follow up with his neurologist.   Cough with rales at bases was likely due to atelectasis and resolved with getting out of bed, deep breathing exercises and ambulation.  He remained afebrile and  without leukocytosis.    Anemia, normocytic, leukopenia, and thrombocytopenia, all of which were mild, are both were likely consequences of plasma exchange. CBC should be repeated in a week after last PLX treatment to ensure they are trending  towards normal.   Consultants:  Neurology  Interventional Radiology  Procedures:   Placement of a right IJ approach tunneled HemoSplit HD catheter for the purpose of plasmapheresis interventional radiology   Antibiotics:  None   Discharge Exam: Filed Vitals:   06/20/12 1536  BP: 94/40  Pulse: 88  Temp: 98.2 F (36.8 C)  Resp: 16   Filed Vitals:   06/20/12 1445 06/20/12 1500 06/20/12 1515 06/20/12 1536  BP: 103/55 109/46 110/52 94/40  Pulse: 88 91 83 88  Temp: 96.7 F (35.9 C) 96.9 F (36.1 C) 96.9 F (36.1 C) 98.2 F (36.8 C)  TempSrc: Temporal Temporal Temporal Oral  Resp: $Remo'20 17 15 16  'donoY$ Height:      Weight:      SpO2:  99% 99% 99%    General: No acute respiratory distress  HEENT: MMM  Lungs: CTAB  Cardiovascular: Regular rate and rhythm without murmur gallop or rub normal S1 and S2, trace extremity edema  Abdomen: Nontender, mildly distended, soft, bowel sounds positive  Musculoskeletal: No significant cyanosis, clubbing of extremities  Neurological: Right lower extremity weakness 4+/5 with hip flexion and 4/5 foot dorsiflexion. Left leg 5-/5 except foot dorsiflexion which is 5-/5 also. BUE 5/5. Unable to illicit lower extremity reflexes. CN II-XII grossly intact.  Psych: A&Ox4  Discharge Instructions  Discharge Orders    Future Appointments: Provider: Department: Dept Phone: Center:   06/21/2012 9:00 AM Estelle June, OT [facility] [phone] None   06/21/2012 10:00 AM Lars Masson, PT [facility] [phone] None   06/21/2012 1:00 PM Cindra Presume, OT [facility] [phone] None   06/21/2012 4:00 PM Jeanene Erb, PTA [facility] [phone] None       Medication List     As of 06/20/2012  7:05 AM    Continue these medications:        aspirin 81 MG tablet   Take 81 mg by mouth daily.      atorvastatin 20 MG tablet   Commonly known as: LIPITOR   Take 20 mg by mouth daily.      calcium-vitamin D 500-200 MG-UNIT per  tablet   Commonly known as: OSCAL WITH D   Take 1 tablet by mouth daily.      CO Q-10 PO   Take 1 tablet by mouth daily.      Fish Oil 1200 MG Caps   Take 1,200 mg by mouth daily.      folic acid 812 MCG tablet   Commonly known as: FOLVITE   Take 800 mcg by mouth daily.      multivitamin tablet   Take 1 tablet by mouth daily.      PHENobarbital 97.2 MG tablet   Commonly known as: LUMINAL   Take 97.2 mg by mouth daily.      phenytoin 100 MG ER capsule   Commonly known as: DILANTIN   Take 100 mg by mouth daily.      polyethylene glycol packet   Commonly known as: MIRALAX / GLYCOLAX   Take 17 g by mouth daily.      PRESERVISION AREDS PO   Take 1 tablet by mouth 2 (two) times daily.      Vitamin D 2000  UNITS tablet   Take 2,000 Units by mouth daily.       Follow-up Information    Follow up with Mickie Hillier, MD. Schedule an appointment as soon as possible for a visit in 2 weeks.   Contact information:   [address] [place] [address] [phone]       Follow up with Levert Feinstein, MD. Schedule an appointment as soon as possible for a visit in 2 weeks.   Contact information:   [address] [place] [address] [phone]           The results of significant diagnostics from this hospitalization (including imaging, microbiology, ancillary and laboratory) are listed below for reference.    Significant Diagnostic Studies: Ct Head Wo Contrast  06/07/2012  *RADIOLOGY REPORT*  Clinical Data: Right-sided extremity numbness.  CT HEAD WITHOUT CONTRAST  Technique:  Contiguous axial images were obtained from the base of the skull through the vertex without contrast.  Comparison: MRI brain 12/20/2011.  Findings: The ventricles are normal.  No extra-axial fluid collections.  No CT findings for acute hemispheric infarction or intracranial hemorrhage.  Dilated perivascular spaces are noted. No mass lesion.  The brainstem and cerebellum appear normal.   The bony structures are intact.  The paranasal sinuses and mastoid air cells are clear except for scattered ethmoid disease.  The globes are intact.  IMPRESSION:  No acute intracranial findings or mass lesion.   Original Report Authenticated By: P. Loralie Champagne, M.D.    Mr [name] Head Wo Contrast  06/07/2012  *RADIOLOGY REPORT*  Clinical Data:  Numbness and weakness.  Rule out stroke.  MRI HEAD WITHOUT CONTRAST MRA HEAD WITHOUT CONTRAST  Technique:  Multiplanar, multiecho pulse sequences of the brain and surrounding structures were obtained without intravenous contrast. Angiographic images of the head were obtained using MRA technique without contrast.  Comparison:  CT 06/07/2012, MRI 12/20/2011  MRI HEAD  Findings:  Negative for acute infarct.  Small white matter hyperintensities in the cerebral white matter bilaterally, unchanged from the prior MRI and appear chronic.  These are likely related to chronic microvascular ischemia.  Negative for intracranial hemorrhage or mass lesion.  No edema or mass effect.  Chronic sinusitis is present.  IMPRESSION: Mild chronic microvascular ischemic change.  No acute abnormality.  MRA HEAD  Findings: Both vertebral arteries are patent to the basilar. Dominant right PICA is widely patent.  Left PICA not visualized. The superior cerebellar and posterior cerebral arteries are patent bilaterally.  The posterior communicating artery is patent bilaterally.  Internal carotid artery is patent bilaterally.  Anterior and middle cerebral arteries are patent bilaterally.  Negative for cerebral aneurysm.  IMPRESSION: Negative   Original Report Authenticated By: Camelia Phenes, M.D.    Mr Brain Wo Contrast  06/07/2012  *RADIOLOGY REPORT*  Clinical Data:  Numbness and weakness.  Rule out stroke.  MRI HEAD WITHOUT CONTRAST MRA HEAD WITHOUT CONTRAST  Technique:  Multiplanar, multiecho pulse sequences of the brain and surrounding structures were obtained without intravenous contrast.  Angiographic images of the head were obtained using MRA technique without contrast.  Comparison:  CT 06/07/2012, MRI 12/20/2011  MRI HEAD  Findings:  Negative for acute infarct.  Small white matter hyperintensities in the cerebral white matter bilaterally, unchanged from the prior MRI and appear chronic.  These are likely related to chronic microvascular ischemia.  Negative for intracranial hemorrhage or mass lesion.  No edema or mass effect.  Chronic sinusitis is present.  IMPRESSION:  Mild chronic microvascular ischemic change.  No acute abnormality.  MRA HEAD  Findings: Both vertebral arteries are patent to the basilar. Dominant right PICA is widely patent.  Left PICA not visualized. The superior cerebellar and posterior cerebral arteries are patent bilaterally.  The posterior communicating artery is patent bilaterally.  Internal carotid artery is patent bilaterally.  Anterior and middle cerebral arteries are patent bilaterally.  Negative for cerebral aneurysm.  IMPRESSION: Negative   Original Report Authenticated By: Truett Perna, M.D.    Mr Cervical Spine Wo Contrast  06/07/2012  *RADIOLOGY REPORT*  Clinical Data: Right arm and right leg numbness and weakness.  MRI CERVICAL SPINE WITHOUT CONTRAST  Technique:  Multiplanar and multiecho pulse sequences of the cervical spine, to include the craniocervical junction and cervicothoracic junction, were obtained according to standard protocol without intravenous contrast.  Comparison: None.  Findings: Moderately exaggerated cervical lordosis.  No traumatic subluxation or compression fracture.  Heterogeneous marrow likely reflects aging.  Mild pannus.   Normal cord signal throughout.  The individual disc spaces were examined as follows:  C2-3:  Mild bulge.  Mild facet arthropathy.  No neural compression.  C3-4:  Central and rightward protrusion.  Asymmetric right-sided facet arthropathy.  Right C4 nerve root encroachment. Slight effacement anterior subarachnoid  space without cord compression.  C4-5:  Mild bulge.  Moderate facet arthropathy.  No definite C5 nerve root encroachment.  Slight effacement anterior subarachnoid space without cord compression.  C5-6:  Central protrusion.  Bilateral facet arthropathy.  Mild effacement anterior subarachnoid space that cord compression. Slight bilateral foraminal narrowing without clear-cut C6 nerve root encroachment.  C6-7:  Central and rightward protrusion with right greater than left uncinate spurring and disc space narrowing.  Slight effacement anterior subarachnoid space without cord compression.  Right greater than left C7 nerve root encroachment likely.  C7-T1:  2 mm facet mediated anterolisthesis.  No disc protrusion or spinal stenosis.  Mild facet arthropathy.  IMPRESSION: Multilevel spondylosis as described.  Potentially symptomatic right- sided neural encroachment could occur at C3-4, and/or C6-7.  No significant cord compression or abnormal cord signal to suggest a myelopathic process.   Original Report Authenticated By: Staci Righter, M.D.    Mr Lumbar Spine Wo Contrast  06/08/2012  *RADIOLOGY REPORT*  Clinical Data: Right leg pain and weakness.  MRI LUMBAR SPINE WITHOUT CONTRAST  Technique:  Multiplanar and multiecho pulse sequences of the lumbar spine were obtained without intravenous contrast.  Comparison: 09/28/2011 sacral MR.  08/27/2011 lumbar spine MR.  Findings: Last fully open disc space is labeled L5-S1.  Present examination incorporates from T11-12 disc space through the S3 level.  Conus just below the T12-L1 disc space.  Bladder is full.  Visualized paravertebral structures otherwise unremarkable.  T11-12:  Minimal Schmorl's node deformity.  T12-L1: Minimal Schmorl's node deformity.  Right lateral disc/osteophyte touches but does not compress the exiting nerve root.  Minimal facet joint degenerative changes on the left.  L1-2:  Minimal facet joint degenerative changes.  L2-3:  Minimal facet joint  degenerative changes.  L3-4:  Bulge slightly greater right lateral position approaching but not compressing the exiting right L3 nerve root.  Minimal impression upon the right ventral thecal sac.  Mild facet joint degenerative changes.  L4-5:  Bulge/osteophyte greater right lateral position approaching but not compressing the exiting right L4 nerve root.  Mild facet joint degenerative changes.  L5-S1:  Mild bulge/spur.  Facet joint degenerative changes.  No significant spinal stenosis or nerve root compression.  IMPRESSION: Mild degenerative changes without significant nerve root compression as detailed above.   Original Report Authenticated By: Doug Sou, M.D.    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  He requires durable central venous access for plasmapheresis.  Procedures Performed: 1. Ultrasound-guided puncture of the right internal jugular vein 2.  Fluoroscopic guidance for placement of a tunneled hemodialysis catheter  Interventional Radiologist:  Criselda Peaches, MD  Sedation: Moderate (conscious) sedation was used.  1 mg Versed, 25 mcg Fentanyl were administered intravenously.  The patient's vital signs were monitored continuously by radiology nursing throughout the procedure.  Sedation Time: 18 minutes  Fluoroscopy time: 0.6 minutes  Contrast volume: None  PROCEDURE/FINDINGS:   Informed consent was obtained from the patient following explanation of the procedure, risks, benefits and alternatives. The patient understands, agrees and consents for the procedure. All questions were addressed. A time out was performed.  Maximal barrier sterile technique utilized including caps, mask, sterile gowns, sterile gloves, large sterile drape, hand hygiene, and  chlorhexidine skin prep.  The right neck was interrogated with ultrasound.  The right internal jugular vein was identified and found to be widely patent. Local anesthesia was achieved with infiltration of 1% lidocaine. Under direct sonographic guidance, the right internal jugular vein was punctured with a 21-gauge micropuncture needle.  An image was obtained stored electronic medical record.  The 5-French transitional dilator was then advanced over a 0.018- inch wire.  A 23 cm tip to cuff HemoSplit tunneled hemodialysis catheter was selected.  An appropriate skin exit site was selected inferior to the clavicle.  Local anesthesia was again achieved with infiltration of 1% lidocaine.  A small dermatotomy was made at the skin exit site.  The 23 cm HemoSplit catheter was then tunneled through the subcutaneous soft tissues from the exit site, to the venous entry site.  The 5-French sheath was then exchanged over a J wire for a peel-away sheath.  The HemoSplit catheter was then advanced through the peel-away sheath and the catheter tip positioned in the upper right atrium.  The catheter flushed and aspirated with ease.  It was secured to the skin with 0 Prolene suture.  The dermatotomy overlying the venous access site was closed with a single, interrupted and inverted 3-0 Vicryl suture.  The epidermis was sealed with Dermabond.  The catheter was flushed and locked with heparinized saline (1000 unit/ml).  There was no immediate complication.  The patient tolerated the procedure well and was returned to his room in stable condition.  IMPRESSION:  Successful placement of a right IJ tunneled hemodialysis catheter for the purposes of plasmapheresis.  The catheter tip is positioned in the upper right atrium and is ready for immediate use.  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  He requires durable central venous access for plasmapheresis.  Procedures Performed: 1. Ultrasound-guided puncture of the right internal jugular vein 2.  Fluoroscopic guidance for placement of a tunneled hemodialysis catheter  Interventional Radiologist:  Criselda Peaches,  MD  Sedation: Moderate (conscious) sedation was used.  1 mg Versed, 25 mcg Fentanyl were administered intravenously.  The patient's vital signs were monitored continuously by radiology nursing throughout the procedure.  Sedation Time: 18 minutes  Fluoroscopy time: 0.6 minutes  Contrast volume: None  PROCEDURE/FINDINGS:   Informed consent was obtained from the patient following explanation of the procedure, risks, benefits and alternatives. The patient understands, agrees and consents for the procedure. All questions were addressed. A time out was performed.  Maximal barrier sterile technique utilized including caps, mask, sterile gowns, sterile gloves, large sterile drape, hand hygiene, and chlorhexidine skin prep.  The right neck was interrogated with ultrasound.  The right internal jugular vein was identified and found to be widely patent. Local anesthesia was achieved with infiltration of 1% lidocaine. Under direct sonographic guidance, the right internal jugular vein was punctured with a 21-gauge micropuncture needle.  An image was obtained stored electronic medical record.  The 5-French transitional dilator was then advanced over a 0.018- inch wire.  A 23 cm tip to cuff HemoSplit tunneled hemodialysis catheter was selected.  An appropriate skin exit site was selected inferior to the clavicle.  Local anesthesia was again achieved with infiltration of 1% lidocaine.  A small dermatotomy was made at the  skin exit site.  The 23 cm HemoSplit catheter was then tunneled through the subcutaneous soft tissues from the exit site, to the venous entry site.  The 5-French sheath was then exchanged over a J wire for a peel-away sheath.  The HemoSplit catheter was then advanced through the peel-away sheath and the catheter tip positioned in the upper right atrium.  The catheter flushed and aspirated with ease.  It was secured to the skin with 0 Prolene suture.  The dermatotomy overlying the venous access site was closed with a single, interrupted and inverted 3-0 Vicryl suture.  The epidermis was sealed with Dermabond.  The catheter was flushed and locked with heparinized saline (1000 unit/ml).  There was no immediate complication.  The patient tolerated the procedure well and was returned to his room in stable condition.  IMPRESSION:  Successful placement of a right IJ tunneled hemodialysis catheter for the purposes of plasmapheresis.  The catheter tip is positioned in the upper right atrium and is ready for immediate use.  Signed,  Criselda Peaches, MD Vascular & Interventional Radiologist [facility] Radiology   Original Report Authenticated By: Myrle Sheng     Microbiology: Recent Results (from the past 240 hour(s))  MRSA PCR SCREENING     Status: Normal   Collection Time   06/11/12  9:21 PM      Component Value Range Status Comment   MRSA by PCR NEGATIVE  NEGATIVE Final      Labs: Basic Metabolic Panel:  Lab 29/79/89 1330 06/18/12 0515 06/16/12 1615  NA 138 139 137  K 3.9 4.2 3.9  CL 106 103 101  CO2 $Re'20 27 28  'siU$ GLUCOSE 122* 107* 122*  BUN $Re'14 15 19  'UXe$ CREATININE 0.58 0.78 0.75  CALCIUM 7.1* 9.0 8.6  MG -- -- --  PHOS -- -- --   Liver Function Tests: No results found for this basename: AST:5,ALT:5,ALKPHOS:5,BILITOT:5,PROT:5,ALBUMIN:5 in the last 168 hours No results found for this basename: LIPASE:5,AMYLASE:5 in the last 168 hours No results found for this basename: AMMONIA:5 in the last 168  hours CBC:  Lab 06/20/12 1330 06/18/12 0515 06/16/12 1615  WBC 3.9* 6.8 5.4  NEUTROABS -- -- --  HGB 12.2* 12.6* 12.4*  HCT 35.4* 36.8* 36.3*  MCV 96.2 94.6 94.0  PLT 126* 124* 122*   Cardiac Enzymes: No results found for this basename: CKTOTAL:5,CKMB:5,CKMBINDEX:5,TROPONINI:5 in the last 168 hours BNP: BNP (last 3 results) No results found for this basename: PROBNP:3 in the last 8760 hours CBG: No results found for this basename: GLUCAP:5 in the last 168 hours  Time coordinating discharge: 45 minutes  Signed:  Deshannon Seide  [facility] 06/20/2012, 9:09 PM

## 2012-06-20 NOTE — H&P (Signed)
Physical Medicine and Rehabilitation Admission H&P    Chief Complaint  Patient presents with  . Weakness  . Numbness  : HPI: Brandon Ray is a 77 y.o. right-handed male with history of coronary artery disease, CABG, seizure disorder and progressive polyneuropathy for which was initially treated in May of this year and followed by Dr. Terrace Arabia of neurology services with IVIG and subsequent developed some rash and was discontinued. Presented 06/11/2012 with increasing numbness and weakness right upper extremity. Recent MRI of the brain showed mild chronic microvascular ischemic change no acute abnormality and MRA of the head negative. MRI lumbar spine without significant nerve root compression and cervical spine MRI with multilevel spondylosis again without significant cord compression . 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 Noted bouts of urinary retention with increased post void residuals a Foley catheter tube had been in place and attempt voiding trial. He remains on Dilantin and phenobarbital for history of seizure disorder. Physical therapy evaluation completed 06/15/2012 with recommendations of physical medicine and rehabilitation consult to consider inpatient rehabilitation services. Patient was felt to be a good candidate for inpatient rehabilitation services and was admitted for comprehensive rehabilitation program  Review of Systems  Eyes: Negative for double vision.  Musculoskeletal: Positive for myalgias.  Skin: Positive for rash.  Neurological: Positive for tingling, seizures and weakness.  All other systems reviewed and are negative   Past Medical History  Diagnosis Date  . Coronary artery disease   . Hyperlipidemia   . Seizure disorder   . Seizures   . Heart attack   . Fever   . Weakness   . Leg swelling     both legs  . Abdominal distension   . Abdominal pain   . Constipation   . Bruises easily   .  Pleural effusion   . Small bowel obstruction   . Peripheral neuropathy    Past Surgical History  Procedure Date  . Cardiac catheterization 01/27/2003    NORMAL LEFT VENTRICULAR SIZE WITH MILD FOCAL LATERAL WALL HYPOKINESIA. EF 60%  . Coronary artery bypass graft 2004    LIMA GRAFT TO THE LAD, SAPHENOUS VEIN GRAFT TO THE DIAGONAL, SAPHENOUS VEIN GRAFT TO THE FIRST OM, SAPHENOUS VEIN GRAFT TO THE PDA  . Transurethral resection of prostate   . Hand surgery 2000    left  . Abdominal surgery 05/2011    bowel obstruction  . Ankle fracture surgery 1997    left   Family History  Problem Relation Age of Onset  . Asthma Mother   . Heart disease Mother   . Coronary artery disease Father   . Heart disease Father    Social History:  reports that he quit smoking about 47 years ago. His smoking use included Cigarettes. He has a 5 pack-year smoking history. He has never used smokeless tobacco. He reports that he drinks alcohol. He reports that he does not use illicit drugs. Allergies:  Allergies  Allergen Reactions  . Amoxicillin Rash    Upper torso only.   Medications Prior to Admission  Medication Sig Dispense Refill  . aspirin 81 MG tablet Take 81 mg by mouth daily.       Marland Kitchen atorvastatin (LIPITOR) 20 MG tablet Take 20 mg by mouth daily.      . calcium-vitamin D (OSCAL WITH D) 500-200 MG-UNIT per tablet Take 1 tablet by mouth daily.        . Cholecalciferol (VITAMIN D) 2000 UNITS  tablet Take 2,000 Units by mouth daily.        . Coenzyme Q10 (CO Q-10 PO) Take 1 tablet by mouth daily.      . folic acid (FOLVITE) 503 MCG tablet Take 800 mcg by mouth daily.       . Multiple Vitamin (MULTIVITAMIN) tablet Take 1 tablet by mouth daily.        . Multiple Vitamins-Minerals (PRESERVISION AREDS PO) Take 1 tablet by mouth 2 (two) times daily.       . Omega-3 Fatty Acids (FISH OIL) 1200 MG CAPS Take 1,200 mg by mouth daily.      Marland Kitchen PHENobarbital (LUMINAL) 97.2 MG tablet Take 97.2 mg by mouth daily.          . phenytoin (DILANTIN) 100 MG ER capsule Take 100 mg by mouth daily.       . polyethylene glycol (MIRALAX / GLYCOLAX) packet Take 17 g by mouth daily.        Home: Home Living Lives With: Spouse Available Help at Discharge: Available 24 hours/day Type of Home: House Home Access: Stairs to enter [employer] of Steps: 1 Entrance Stairs-Rails: None Home Layout: Two level;Able to live on main level with bedroom/bathroom Bathroom Shower/Tub: Walk-in shower;Door [employer] Toilet: Standard Bathroom Accessibility: Yes How Accessible: Accessible via walker Home Adaptive Equipment: Built-in shower seat;Walker - rolling;Straight cane   Functional History: Prior Function Able to Take Stairs?: Yes Driving: Yes Comments: likes photography, walking and hiking. Very active prior to last admission. For the past two weeks ambulation has been getting much harder for him because of weakness  Functional Status:  Mobility: Bed Mobility Bed Mobility: Supine to Sit Rolling Left: 4: Min assist Left Sidelying to Sit: 4: Min guard;With rails Supine to Sit: 5: Supervision;HOB flat Sitting - Scoot to Edge of Bed: 4: Min guard Sit to Supine: 4: Min assist Scooting to [facility]: 6: Modified independent (Device/Increase time) Transfers Transfers: Sit to Stand;Stand to Sit Sit to Stand: With upper extremity assist (with rocking motion to generate momentum for sit>stand) Sit to Stand: Patient Percentage: 60% Stand to Sit: 3: Mod assist;Without upper extremity assist;To chair/3-in-1 Ambulation/Gait Ambulation/Gait Assistance: 4: Min assist;3: Mod assist Ambulation Distance (Feet): 100 Feet Assistive device: Rolling walker Ambulation/Gait Assistance Details: minA for stability during ambulation needing facilitation for tall posture and improved control with stepping; pt with steppage gait on the right; as he fatigued pt's hip/knee buckled on the right requiring min-modA to correct and pt became more  impuslive as he fatigues needing increased cueing for safety Gait Pattern: Trunk flexed;Narrow base of support;Right steppage General Gait Details: genu valgus knee posture, decreased muscular control of RLE with swing, heavy use of upper extremities on RW secondary to weak lower extremities    ADL: ADL Grooming: Simulated;Moderate assistance Where Assessed - Grooming: Supported standing Upper Body Bathing: Simulated;Minimal assistance Where Assessed - Upper Body Bathing: Unsupported sitting Lower Body Bathing: Simulated;Maximal assistance Where Assessed - Lower Body Bathing: Supported sit to stand Upper Body Dressing: Simulated;Minimal assistance Where Assessed - Upper Body Dressing: Unsupported sitting Lower Body Dressing: Simulated;Maximal assistance Where Assessed - Lower Body Dressing: Supported sit to Lobbyist: Performed;Moderate assistance;Other (comment) (walk to bathroom with walker) Toilet Transfer Method: Sit to stand Toilet Transfer Equipment: Comfort height toilet;Grab bars  Cognition: Cognition Arousal/Alertness: Awake/alert Orientation Level: Oriented X4 Cognition Overall Cognitive Status: Appears within functional limits for tasks assessed/performed Arousal/Alertness: Awake/alert Orientation Level: Appears intact for tasks assessed Behavior During Session: [facility] for tasks performed  Cognition - Other Comments: Very pleasant intelligent gentleman   Blood pressure 110/49, pulse 83, temperature 98.2 F (36.8 C), temperature source Oral, resp. rate 18, height $RemoveBe'6\' 1"'[id number]$  (1.854 m), weight 86.8 kg (191 lb 5.8 oz), SpO2 97.00%. Physical Exam  Vitals reviewed.  Constitutional: He is oriented to person, place, and time. He appears well-developed.  HENT:  Head: Normocephalic.  Eyes:  Pupils round and reactive to light  Neck: Neck supple. No thyromegaly present. Right IJ catheter in place Cardiovascular: Normal rate and regular rhythm.  Pulmonary/Chest: Effort  normal and breath sounds normal. No respiratory distress. He has no wheezes.  Abdominal: Bowel sounds are normal. He exhibits no distension. There is no tenderness.  Musculoskeletal: He exhibits no edema.  Neurological: He is alert and oriented to person, place, and time. No cranial nerve deficit.  UE strength grossly 4-5/5. LE grossly 3+/5 prox to 3+ to 4 distally. Stocking glove sensory loss in both limbs below the knees which has improved from my last exam. Mild sensory loss over the distal right hand. DTR's are trace  Skin: Skin is warm and dry.  Psychiatric: He has a normal mood and affect. His behavior is normal. Judgment and thought content normal   No results found for this or any previous visit (from the past 48 hour(s)). No results found.  Post Admission Physician Evaluation: 1. Functional deficits secondary  to CIDP with primarily lower extremity weakness and sensory loss. 2. Patient is admitted to receive collaborative, interdisciplinary care between the physiatrist, rehab nursing staff, and therapy team. 3. Patient's level of medical complexity and substantial therapy needs in context of that medical necessity cannot be provided at a lesser intensity of care such as a SNF. 4. Patient has experienced substantial functional loss from his/her baseline which was documented above under the "Functional History" and "Functional Status" headings.  Judging by the patient's diagnosis, physical exam, and functional history, the patient has potential for functional progress which will result in measurable gains while on inpatient rehab.  These gains will be of substantial and practical use upon discharge  in facilitating mobility and self-care at the household level. 5. Physiatrist will provide 24 hour management of medical needs as well as oversight of the therapy plan/treatment and provide guidance as appropriate regarding the interaction of the two. 6. 24 hour rehab nursing will assist with  bladder management, bowel management, safety, skin/wound care, disease management, medication administration, pain management and patient education  and help integrate therapy concepts, techniques,education, etc. 7. PT will assess and treat for:  fxnl mobility, NMR, adaptive equipment, safety.  Goals are: mod I. 8. OT will assess and treat for: Upper ext strength, ROM, fxnl mobility, adaptive equipment, ADL's.   Goals are: mod I to supervision. 9. SLP will assess and treat for: n/a.  Goals are: n/a. 10. Case Management and Social Worker will assess and treat for psychological issues and discharge planning. 11. Team conference will be held weekly to assess progress toward goals and to determine barriers to discharge. 12. Patient will receive at least 3 hours of therapy per day at least 5 days per week. 13. ELOS: 7 days      Prognosis:  excellent   Medical Problem List and Plan: 1. Chronic inflammatory demyelinating polyneuropathy. 5 treatments of plasmapheresis completed 06/20/2012 2. DVT Prophylaxis/Anticoagulation: SCDs. Monitor for signs of DVT 3. urinary retention/neurogenic bladder. Voiding trial underway. Check PVRs x3. Consider adding Flomax and/or Urecholine if needed.. Need to be cautious with noted history of  coronary artery disease 4. Constipation. Adjust bowel program 5. Neuropsych: This patient is capable of making decisions on his/her own behalf. 6. CAD status post CABG 2004. Continue aspirin therapy. Patient no chest pain or shortness of breath 7. Seizure disorder. Dilantin 100 mg each bedtime and phenobarbital 97.2 mg each bedtime. Patient no recent seizure activity  06/20/2012, Oval Linsey, MD

## 2012-06-20 NOTE — PMR Pre-admission (Addendum)
PMR Admission Coordinator Pre-Admission Assessment  Patient: Brandon Ray is an 77 y.o., male MRN: [id number] DOB: 1935/09/14 Height: 6\' 1"  (185.4 cm) Weight: 86.8 kg (191 lb 5.8 oz)  Insurance Information  PPO: yes      PRIMARY: Blue Medicare     Policy#: [id number]      Subscriber: self CM Name: Limmie Patricia        Phone#: [phone] Fax#: [phone] Pre-Cert#:   [id number]                                    Employer:Retired Benefits:  Phone #: [phone]     Name:Sabrina Eff. Date: 09/21/11     Deduct: 0      Out of Pocket Max: $3400 ($2210 met)      Life Max: 0 CIR: ($195?1-6)($0/7+) 80/20%      SNF: ($0/1-10) ($50/11-100) <100 days max per bin period Outpatient: 80/20%     Home Health: need pre-auth     Co-Pay: 0 DME:80/20%     Providers: in network  Emergency [place]    Name Relation Home Work [place] Spouse [phone]  [phone]     Current Medical History  Patient Admitting Diagnosis: Chronic inflammatory demyelinating polyneuropathy  History of Present Illness:77 y.o. right-handed male with history of coronary artery disease, CABG, seizure disorder and progressive polyneuropathy for which was initially treated in May of this year and followed by Dr. Krista Blue of neurology services with IVIG and subsequent developed some rash and was discontinued. Presented 06/11/2012 with increasing numbness and weakness right upper extremity. Recent MRI of the brain showed mild chronic microvascular ischemic change no acute abnormality and MRA of the head negative. MRI lumbar spine without significant nerve root compression and cervical spine MRI with multilevel spondylosis again without significant cord compression . 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 He remains on Dilantin for history of seizure disorder. Total: 2 =NIH   Past Medical History  Past Medical History    Diagnosis Date  . Coronary artery disease   . Hyperlipidemia   . Seizure disorder   . Seizures   . Heart attack   . Fever   . Weakness   . Leg swelling     both legs  . Abdominal distension   . Abdominal pain   . Constipation   . Bruises easily   . Pleural effusion   . Small bowel obstruction   . Peripheral neuropathy     Family History  family history includes Asthma in his mother; Coronary artery disease in his father; and Heart disease in his father and mother.  Prior Rehab/Hospitalizations: none  Current Medications  Current facility-administered medications:0.9 %  sodium chloride infusion, 250 mL, Intravenous, PRN, Sorin June Leap, MD;  acetaminophen (TYLENOL) suppository 650 mg, 650 mg, Rectal, Q6H PRN, Sorin June Leap, MD;  acetaminophen (TYLENOL) tablet 650 mg, 650 mg, Oral, Q6H PRN, Sorin June Leap, MD;  acetaminophen (TYLENOL) tablet 650 mg, 650 mg, Oral, Q4H PRN, Wallie Char albumin human 50 g in sodium chloride 0.9 %, , Dialysis, Q1 Hr x 3, Janece Canterbury, MD;  aspirin EC tablet 81 mg, 81 mg, Oral, Daily, Sorin C Laza, MD, 81 mg at 06/20/12 0948;  bisacodyl (DULCOLAX) EC tablet 10 mg, 10 mg, Oral, Daily PRN, Janece Canterbury, MD;  calcium carbonate (TUMS - dosed in mg elemental  calcium) chewable tablet 400 mg of elemental calcium, 2 tablet, Oral, PRN, Noel Christmas, 400 mg of elemental calcium at 06/14/12 0824 calcium gluconate 2 g in sodium chloride 0.9 % 100 mL IVPB, 2 g, Intravenous, PRN, Noel Christmas, 2 g at 06/16/12 1528;  calcium gluconate 2 g in sodium chloride 0.9 % 100 mL IVPB, 2 g, Intravenous, PRN, Noel Christmas, 2 g at 06/18/12 2045;  calcium gluconate 4 g in sodium chloride 0.9 % 250 mL IVPB, 4 g, Intravenous, PRN, Noel Christmas;  calcium gluconate inj 10% (1 g) URGENT USE ONLY!, 2 g, Intravenous, PRN, Noel Christmas citrate dextrose (ACD-A anticoagulant) solution 500 mL, 500 mL, Intravenous, Continuous, Noel Christmas, 500 mL at 06/14/12 0810;   diphenhydrAMINE (BENADRYL) capsule 25 mg, 25 mg, Oral, Q6H PRN, Noel Christmas;  diphenhydrAMINE (BENADRYL) capsule 25 mg, 25 mg, Oral, Q6H PRN, Noel Christmas;  folic acid (FOLVITE) tablet 1 mg, 1 mg, Oral, Daily, Sorin C Laza, MD, 1 mg at 06/20/12 0948 heparin injection 1,000 Units, 1,000 Units, Intracatheter, Once, Noel Christmas;  mupirocin ointment (BACTROBAN) 2 %, , Topical, BID, Renae Fickle, MD;  ondansetron [facility]) injection 4 mg, 4 mg, Intravenous, Q6H PRN, Caroline More, NP, 4 mg at 06/16/12 2122;  PHENobarbital (LUMINAL) tablet 97.2 mg, 97.2 mg, Oral, QHS, Sorin C Laza, MD, 97.2 mg at 06/19/12 2153 phenytoin (DILANTIN) ER capsule 100 mg, 100 mg, Oral, QHS, Sorin C Laza, MD, 100 mg at 06/19/12 2153;  polyethylene glycol (MIRALAX / GLYCOLAX) packet 17 g, 17 g, Oral, Daily, Sorin C Laza, MD, 17 g at 06/18/12 1036;  polyethylene glycol (MIRALAX / GLYCOLAX) packet 17 g, 17 g, Oral, Daily PRN, Caroline More, NP, 17 g at 06/16/12 2224 sodium chloride 0.9 % 500 mL with albumin human 25 g infusion, , Intravenous, Continuous, Lonia Blood, MD, Last Rate: 20 mL/hr at 06/12/12 1809;  sodium chloride 0.9 % 500 mL with albumin human 25 g infusion, , Intravenous, Once, Renae Fickle, MD;  sodium chloride 0.9 % injection 3 mL, 3 mL, Intravenous, Q12H, Sorin C Laza, MD, 3 mL at 06/20/12 0949 sodium chloride 0.9 % injection 3 mL, 3 mL, Intravenous, Q12H, Sorin C Laza, MD, 3 mL at 06/19/12 7693;  sodium chloride 0.9 % injection 3 mL, 3 mL, Intravenous, PRN, Sorin Luanne Bras, MD  Patients Current Diet: General  Precautions / Restrictions Precautions Precautions: Fall Restrictions Weight Bearing Restrictions: No   Prior Activity Level Community (5-7x/wk): Active daily  Home Assistive Devices / Equipment Home Assistive Devices/Equipment: Eyeglasses;Walker (specify type) Home Adaptive Equipment: Built-in shower seat;Walker - rolling;Straight cane  Prior Functional Level Prior Function Level of  Independence: Independent Able to Take Stairs?: Yes Driving: Yes Comments: likes photography, walking and hiking. Very active prior to last admission. For the past two weeks ambulation has been getting much harder for him because of weakness  Current Functional Level Cognition  Arousal/Alertness: Awake/alert Overall Cognitive Status: Appears within functional limits for tasks assessed/performed Orientation Level: Oriented X4 Cognition - Other Comments: Very pleasant intelligent gentleman    Extremity Assessment (includes Sensation/Coordination)  RUE ROM/Strength/Tone: Deficits RUE ROM/Strength/Tone Deficits: grossly 4/5 RUE Sensation: Deficits RUE Sensation Deficits: reports diminished LT. Pt reports a tingly sensation in RUE  RLE Sensation: History of peripheral neuropathy;Deficits RLE Sensation Deficits: diminished LT, proprioception and pin prick sensation as well as increased hypersensitivity to dorsum of feet    ADLs  Grooming: Simulated;Moderate assistance Where Assessed - Grooming: Supported standing Upper Body Bathing: Simulated;Minimal assistance Where Assessed - Upper  Body Bathing: Unsupported sitting Lower Body Bathing: Simulated;Maximal assistance Where Assessed - Lower Body Bathing: Supported sit to stand Upper Body Dressing: Simulated;Minimal assistance Where Assessed - Upper Body Dressing: Unsupported sitting Lower Body Dressing: Simulated;Maximal assistance Where Assessed - Lower Body Dressing: Supported sit to stand Toilet Transfer: Performed;Moderate assistance;Other (comment) (walk to bathroom with walker) Toilet Transfer Method: Sit to stand Toilet Transfer Equipment: Comfort height toilet;Grab bars Toileting - Clothing Manipulation and Hygiene: Performed;Maximal assistance Where Assessed - Camera operator Manipulation and Hygiene: Standing    Mobility  Bed Mobility: Supine to Sit Rolling Left: 4: Min assist Left Sidelying to Sit: 4: Min guard;With  rails Supine to Sit: 5: Supervision;HOB flat Sitting - Scoot to Edge of Bed: 4: Min guard Sit to Supine: 4: Min assist Scooting to [facility]: 6: Modified independent (Device/Increase time)    Transfers  Transfers: Sit to Stand;Stand to Sit Sit to Stand: With upper extremity assist (with rocking motion to generate momentum for sit>stand) Sit to Stand: Patient Percentage: 60% Stand to Sit: 3: Mod assist;Without upper extremity assist;To chair/3-in-1    Ambulation / Gait / Stairs / Emergency planning/management officer  Ambulation/Gait Ambulation/Gait Assistance: 4: Min assist;3: Mod assist Ambulation Distance (Feet): 100 Feet Assistive device: Rolling walker Ambulation/Gait Assistance Details: minA for stability during ambulation needing facilitation for tall posture and improved control with stepping; pt with steppage gait on the right; as he fatigued pt's hip/knee buckled on the right requiring min-modA to correct and pt became more impuslive as he fatigues needing increased cueing for safety Gait Pattern: Trunk flexed;Narrow base of support;Right steppage General Gait Details: genu valgus knee posture, decreased muscular control of RLE with swing, heavy use of upper extremities on RW secondary to weak lower extremities    Posture / Balance Static Standing Balance Static Standing - Balance Support: Bilateral upper extremity supported;No upper extremity supported Static Standing - Level of Assistance: 4: Min assist;5: Stand by assistance Static Standing - Comment/# of Minutes: cues for hip and knee extension bilaterally and for tall posture, initially pt needing min-modA for stabilty as pt falls posteriorly with decreased ankle strategies Dynamic Standing Balance Dynamic Standing - Balance Support: No upper extremity supported Dynamic Standing - Level of Assistance: 4: Min assist;3: Mod assist Dynamic Standing - Balance Activities: Forward lean/weight shifting;Other (comment) (truck rotation with head  turns) Dynamic Standing - Comments: facilitation at hips for anterior/posterior weight shift and cues for knee extension to improve ankle strategies and strengthening, LOB again sevel times posteriorly with postural cues to correct with weight shift as opposed to grabbing for objects     Previous Home Environment Living Arrangements: Spouse/significant other Lives With: Spouse Available Help at Discharge: Available 24 hours/day Type of Home: House Home Layout: Two level;Able to live on main level with bedroom/bathroom Home Access: Stairs to enter Entrance Stairs-Rails: None Entrance Stairs-Number of Steps: 1 Bathroom Shower/Tub: Walk-in shower;Door [employer] Toilet: Standard Bathroom Accessibility: Yes How Accessible: Accessible via walker Home Care Services: No  Discharge Living Setting Plans for Discharge Living Setting: Patient's home;Lives with (comment) (wife) Type of Home at Discharge: House Discharge Home Layout: Two level;Able to live on main level with bedroom/bathroom Discharge Home Access: Stairs to enter Entrance Stairs-Rails: None Entrance Stairs-Number of Steps: 1 Do you have any problems obtaining your medications?: No  Social/Family/Support Systems Patient Roles: Spouse;Parent Contact Information:  [phone]) Anticipated Caregiver: Wife Anticipated Caregiver's Contact Information: Bharath Bernstein: [phone], cell:[phone] Ability/Limitations of Caregiver: none Caregiver Availability: 24/7 Discharge Plan Discussed with Primary Caregiver: Yes Is Caregiver  In Agreement with Plan?: Yes Does Caregiver/Family have Issues with Lodging/Transportation while Pt is in Rehab?: No  Goals/Additional Needs Patient/Family Goal for Rehab: PT&OT mod I Expected length of stay: 7-10 days Cultural Considerations: none Dietary Needs: regular Equipment Needs: TBD Pt/Family Agrees to Admission and willing to participate: Yes Program Orientation Provided & Reviewed with  Pt/Caregiver Including Roles  & Responsibilities: Yes  Patient Condition: Please see physician update to information in consult dated 06/16/12.  Preadmission Screen Completed By:  Flo Shanks, 06/20/2012 11:59 AM ______________________________________________________________________   Discussed status with Dr. Naaman Plummer on 06/20/12  at 12:15PM and received telephone approval for admission today.  Admission Coordinator:  Flo Shanks, time 12:15 PM/Date10/1/13

## 2012-06-20 NOTE — Progress Notes (Signed)
Pt back from dialysis, no compliants   To rehab via wheelchairl

## 2012-06-20 NOTE — Progress Notes (Signed)
PT Cancellation Note  Treatment cancelled today due to patient receiving procedure or test. Pt off the floor for PLX and noted pt to d/c CIR today.  [facility] HELEN 06/20/2012, 1:45 PM Pager: [phone]

## 2012-06-21 ENCOUNTER — Inpatient Hospital Stay (HOSPITAL_COMMUNITY): Payer: Medicare Other

## 2012-06-21 ENCOUNTER — Inpatient Hospital Stay (HOSPITAL_COMMUNITY): Payer: Medicare Other | Admitting: Occupational Therapy

## 2012-06-21 ENCOUNTER — Inpatient Hospital Stay (HOSPITAL_COMMUNITY): Payer: Medicare Other | Admitting: Physical Therapy

## 2012-06-21 DIAGNOSIS — Z5189 Encounter for other specified aftercare: Secondary | ICD-10-CM

## 2012-06-21 DIAGNOSIS — G6181 Chronic inflammatory demyelinating polyneuritis: Secondary | ICD-10-CM

## 2012-06-21 LAB — CBC WITH DIFFERENTIAL/PLATELET
Basophils Absolute: 0 10*3/uL (ref 0.0–0.1)
Basophils Relative: 1 % (ref 0–1)
Eosinophils Absolute: 0.2 10*3/uL (ref 0.0–0.7)
Lymphs Abs: 1.5 10*3/uL (ref 0.7–4.0)
MCH: 32.4 pg (ref 26.0–34.0)
Neutrophils Relative %: 44 % (ref 43–77)
Platelets: 97 10*3/uL — ABNORMAL LOW (ref 150–400)
RBC: 3.83 MIL/uL — ABNORMAL LOW (ref 4.22–5.81)
RDW: 13.3 % (ref 11.5–15.5)

## 2012-06-21 LAB — COMPREHENSIVE METABOLIC PANEL
ALT: 14 U/L (ref 0–53)
AST: 18 U/L (ref 0–37)
Albumin: 4.5 g/dL (ref 3.5–5.2)
Alkaline Phosphatase: 33 U/L — ABNORMAL LOW (ref 39–117)
Potassium: 3.9 mEq/L (ref 3.5–5.1)
Sodium: 140 mEq/L (ref 135–145)
Total Protein: 5.2 g/dL — ABNORMAL LOW (ref 6.0–8.3)

## 2012-06-21 NOTE — Progress Notes (Signed)
Subjective/Complaints: Wonders when his catheter will come out. Voiding well. Denies pain A 12 point review of systems has been performed and if not noted above is otherwise negative.   Objective: Vital Signs: Blood pressure 115/68, pulse 76, temperature 98.3 F (36.8 C), temperature source Oral, resp. rate 17, height $RemoveBe'6\' 1"'reencLVcF$  (1.854 m), weight 88.4 kg (194 lb 14.2 oz), SpO2 98.00%. No results found.  Basename 06/20/12 1330  WBC 3.9*  HGB 12.2*  HCT 35.4*  PLT 126*    Basename 06/20/12 1330  NA 138  K 3.9  CL 106  CO2 20  GLUCOSE 122*  BUN 14  CREATININE 0.58  CALCIUM 7.1*   CBG (last 3)  No results found for this basename: GLUCAP:3 in the last 72 hours  Wt Readings from Last 3 Encounters:  06/21/12 88.4 kg (194 lb 14.2 oz)  06/20/12 86.8 kg (191 lb 5.8 oz)  06/07/12 84.6 kg (186 lb 8.2 oz)    Physical Exam:  Constitutional: He is oriented to person, place, and time. He appears well-developed.  HENT:  Head: Normocephalic.  Eyes:  Pupils round and reactive to light  Neck: Neck supple. No thyromegaly present. Right IJ catheter in place  Cardiovascular: Normal rate and regular rhythm.  Pulmonary/Chest: Effort normal and breath sounds normal. No respiratory distress. He has no wheezes.  Abdominal: Bowel sounds are normal. He exhibits no distension. There is no tenderness.  Musculoskeletal: He exhibits no edema.  Neurological: He is alert and oriented to person, place, and time. No cranial nerve deficit.  UE strength grossly 4-5/5. LE grossly 3+/5 prox to 3+ to 4 distally. Stocking glove sensory loss in both limbs below the knees which has improved. Mild sensory loss over the distal right hand. DTR's are trace  Skin: Skin is warm and dry.  Psychiatric: He has a normal mood and affect. His behavior is normal. Judgment and thought content normal   Assessment/Plan: 1. Functional deficits secondary to CIDP which require 3+ hours per day of interdisciplinary therapy in a  comprehensive inpatient rehab setting. Physiatrist is providing close team supervision and 24 hour management of active medical problems listed below. Physiatrist and rehab team continue to assess barriers to discharge/monitor patient progress toward functional and medical goals. FIM:                   Comprehension Comprehension Mode: Auditory Comprehension: 7-Follows complex conversation/direction: With no assist  Expression Expression Mode: Verbal Expression: 7-Expresses complex ideas: With no assist  Social Interaction Social Interaction: 7-Interacts appropriately with others - No medications needed.  Problem Solving Problem Solving: 7-Solves complex problems: Recognizes & self-corrects  Memory Memory: 7-Complete Independence: No helper Medical Problem List and Plan:  1. Chronic inflammatory demyelinating polyneuropathy. 5 treatments of plasmapheresis completed 06/20/2012  2. DVT Prophylaxis/Anticoagulation: SCDs. Monitor for signs of DVT  3. urinary retention/neurogenic bladder. Voiding trial underway and going well so far. 4. Constipation. Adjusted bowel program  5. Neuropsych: This patient is capable of making decisions on his/her own behalf.  6. CAD status post CABG 2004. Continue aspirin therapy. Patient no chest pain or shortness of breath  7. Seizure disorder. Dilantin 100 mg each bedtime and phenobarbital 97.2 mg each bedtime. Patient no recent seizure activity   LOS (Days) 1 A FACE TO FACE EVALUATION WAS PERFORMED  Aunika Kirsten T 06/21/2012, 7:09 AM

## 2012-06-21 NOTE — Evaluation (Signed)
Physical Therapy Assessment and Plan  Patient Details  Name: Brandon Ray MRN: [id number] Date of Birth: 02-24-1935  PT Diagnosis: Abnormality of gait, Difficulty walking, Impaired sensation and Muscle weakness Rehab Potential: Excellent ELOS: 5-7 days   Today's Date: 06/21/2012 Time: 1000-1100 Time Calculation (min): 60 min  Problem List:  Patient Active Problem List  Diagnosis  . Coronary artery disease  . Hyperlipidemia  . Small bowel obstruction, s/p lap LOA, c/b post op abscess  . Pleural effusion on right  . Tachycardia  . Right sided weakness  . Seizure disorder  . Chronic inflammatory demyelinating polyneuropathy  . Acute urinary retention  . Neurogenic bladder  . Constipation  . CIDP (chronic inflammatory demyelinating polyneuropathy)    Past Medical History:  Past Medical History  Diagnosis Date  . Coronary artery disease   . Hyperlipidemia   . Seizure disorder   . Seizures   . Heart attack   . Fever   . Weakness   . Leg swelling     both legs  . Abdominal distension   . Abdominal pain   . Constipation   . Bruises easily   . Pleural effusion   . Small bowel obstruction   . Peripheral neuropathy    Past Surgical History:  Past Surgical History  Procedure Date  . Cardiac catheterization 01/27/2003    NORMAL LEFT VENTRICULAR SIZE WITH MILD FOCAL LATERAL WALL HYPOKINESIA. EF 60%  . Coronary artery bypass graft 2004    LIMA GRAFT TO THE LAD, SAPHENOUS VEIN GRAFT TO THE DIAGONAL, SAPHENOUS VEIN GRAFT TO THE FIRST OM, SAPHENOUS VEIN GRAFT TO THE PDA  . Transurethral resection of prostate   . Hand surgery 2000    left  . Abdominal surgery 05/2011    bowel obstruction  . Ankle fracture surgery 1997    left    Assessment & Plan Clinical Impression: Patient is a 77 y.o. year old male with recent admission to the hospital with history of coronary artery disease, CABG, seizure disorder and progressive polyneuropathy for which was initially treated in  May of this year and followed by Dr. Terrace Arabia of neurology services with IVIG and subsequent developed some rash and was discontinued. Presented 06/11/2012 with increasing numbness and weakness right upper extremity. Recent MRI of the brain showed mild chronic microvascular ischemic change no acute abnormality and MRA of the head negative. MRI lumbar spine without significant nerve root compression and cervical spine MRI with multilevel spondylosis again without significant cord compression . 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 Noted bouts of urinary retention with increased post void residuals a Foley catheter tube had been in place and attempt voiding trial. He remains on Dilantin and phenobarbital for history of seizure disorder.  Patient transferred to CIR on 06/20/2012 .   Patient currently requires min with mobility secondary to muscle weakness, unbalanced muscle activation and decreased standing balance and decreased balance strategies.  Prior to hospitalization, patient was independent with mobility and lived with Spouse in a House home.  Home access is 1 for garage entrance, 2 for front door entrance.  Patient will benefit from skilled PT intervention to maximize safe functional mobility, minimize fall risk and decrease caregiver burden for planned discharge home with 24 hour supervision.  Anticipate patient will benefit from follow up OP at discharge.  PT - End of Session Endurance Deficit: Yes PT Assessment Rehab Potential: Excellent Barriers to Discharge: None PT Plan PT Frequency: 1-2 X/day, 60-90  minutes Estimated Length of Stay: 5-7 days PT Treatment/Interventions: Ambulation/gait training;Balance/vestibular training;Community reintegration;Discharge planning;Disease management/prevention;DME/adaptive equipment instruction;Functional electrical stimulation;Functional mobility training;Neuromuscular re-education;Pain  management;Patient/family education;Psychosocial support;Skin care/wound management;Splinting/orthotics;Stair training;Therapeutic Activities;Therapeutic Exercise;UE/LE Strength taining/ROM;UE/LE Coordination activities;Wheelchair propulsion/positioning PT Recommendation Follow Up Recommendations: Outpatient PT Equipment Recommended: None recommended by PT  PT Evaluation Precautions/Restrictions Precautions Precautions: Fall Precaution Comments: h/o of seizure Restrictions Weight Bearing Restrictions: No    Pain Pain Assessment Pain Assessment: No/denies pain Pain Score: 0-No pain Home Living/Prior Functioning Home Living Lives With: Spouse Available Help at Discharge: Family;Available 24 hours/day Type of Home: House Home Access: Stairs to enter [employer] of Steps: 1 for garage entrance, 2 for front door entrance Entrance Stairs-Rails: None Home Layout: Two level;Able to live on main level with bedroom/bathroom Bathroom Shower/Tub: Walk-in shower;Door (w/ 2 built in seats) Biochemist, clinical: Standard Bathroom Accessibility: Yes How Accessible: Accessible via walker Home Adaptive Equipment: Built-in shower seat;Walker - rolling;Straight cane;Bedside commode/3-in-1 Prior Function Level of Independence: Independent with basic ADLs;Independent with gait;Independent with homemaking with ambulation Able to Take Stairs?: Yes Driving: Yes Vocation: Retired Leisure: Hobbies-yes (Comment) Comments: hiking, photography, very active PTA Vision/Perception  Vision - History Baseline Vision: Wears glasses all the time Patient Visual Report: No change from baseline Vision - Assessment Eye Alignment: Within Functional Limits Perception Perception: Within Functional Limits Praxis Praxis: Intact  Cognition Overall Cognitive Status: Appears within functional limits for tasks assessed Arousal/Alertness: Awake/alert Orientation Level: Oriented X4 Memory: Appears  intact Awareness: Appears intact Problem Solving: Appears intact Safety/Judgment: Appears intact Sensation Sensation Light Touch: Impaired Detail Light Touch Impaired Details: Impaired RLE;Impaired LLE (neuropathy per report knee down) Additional Comments: patient reports burning sensation in right UE and numbness & tingling in fingertips; LUE appear intact Coordination Gross Motor Movements are Fluid and Coordinated: Yes Fine Motor Movements are Fluid and Coordinated: Yes Motor  Motor Motor: Other (comment) (generalized weakness, R foot drop/stepage gait)    Locomotion  Ambulation Ambulation/Gait Assistance: 4: Min assist Gait Gait Pattern: Decreased dorsiflexion - left;Right hip hike;Right steppage  Trunk/Postural Assessment  Cervical Assessment Cervical Assessment: Within Functional Limits Thoracic Assessment Thoracic Assessment: Within Functional Limits Lumbar Assessment Lumbar Assessment: Within Functional Limits  Balance Balance Balance Assessed: Yes Static Sitting Balance Static Sitting - Level of Assistance: 6: Modified independent (Device/Increase time) Dynamic Sitting Balance Dynamic Sitting - Level of Assistance: 5: Stand by assistance Static Standing Balance Static Standing - Level of Assistance: 4: Min assist Dynamic Standing Balance Dynamic Standing - Level of Assistance: 4: Min assist Extremity Assessment  RUE Assessment RUE Assessment: Within Functional Limits (5/5 strength) LUE Assessment LUE Assessment: Within Functional Limits (5/5 strength) RLE Assessment RLE Assessment: Exceptions to [facility] (3-/5 ankle df, 3+/5 knee ext & hip, 4/5 hamstring) LLE Assessment LLE Assessment: Exceptions to [facility] (3-/5 ankle df (more than on R), 3+/5 knee and hip)  See FIM for current functional status Refer to Care Plan for Long Term Goals  Recommendations for other services: None  Discharge Criteria: Patient will be discharged from PT if patient refuses treatment 3  consecutive times without medical reason, if treatment goals not met, if there is a change in medical status, if patient makes no progress towards goals or if patient is discharged from hospital.  The above assessment, treatment plan, treatment alternatives and goals were discussed and mutually agreed upon: by patient  Individual therapy treatment initiated with focus on gait, stair training with rails and with RW up/down curb step for home entry, and functional strengthening with Kinetron in seated  and standing position with 2 min intervals of work:rest x 3 reps in sitting and 2 reps in standing. Pt very motivated and hard worked. Discussed possibility of bracing in future (maybe outpatient due to short LOS) for RLE as foot slap noted due to decreased active df and pt reports R knee buckling (did not buckle at eval), but did note  increased flexion in hip and knee for foot clearance. Discussed overall goals with pt and in agreement.  [name] [facility] 06/21/2012, 11:17 AM

## 2012-06-21 NOTE — Progress Notes (Signed)
Occupational Therapy Session Note  Patient Details  Name: Brandon Ray MRN: [id number] Date of Birth: 1934-10-18  Today's Date: 06/21/2012 Time: 8938-1017 Time Calculation (min): 45 min  Skilled Therapeutic Interventions/Progress Updates:    Worked on sit to stand and static standing balance during session.  Had pt sit on adjustable mat and initially raised it up for pt to practice sit to stand transitions.  Pt with decreased timing and sequencing of sit to stand.  Tends to extend knees without simultaneously extending hips.  In standing also exhibits increased posterior weight shift with weight over his heels and LOB backwards.  During transition to sitting he demonstrates decreased forward trunk flexion to maintain his balance over his feet and tends to fall backwards.  Progressively had pt attempt sit to stand from lowered mat and also without using his UEs.  Needed max assist from lowered surfaces and without use of UEs.  Only needs min assist from higher surfaces with use of UEs.  Therapy Documentation Precautions:  Precautions Precautions: Fall Precaution Comments: h/o of seizure Restrictions Weight Bearing Restrictions: No  Pain: Pain Assessment Pain Assessment: No/denies pain  See FIM for current functional status  Therapy/Group: Individual Therapy  Kashia Brossard OTR/L 06/21/2012, 3:00 PM

## 2012-06-21 NOTE — Progress Notes (Signed)
Social Work  Social Work Assessment and Plan  Patient Details  Name: Brandon Ray MRN: [id number] Date of Birth: 08/06/1935  Today's Date: 06/21/2012  Problem List:  Patient Active Problem List  Diagnosis  . Coronary artery disease  . Hyperlipidemia  . Small bowel obstruction, s/p lap LOA, c/b post op abscess  . Pleural effusion on right  . Tachycardia  . Right sided weakness  . Seizure disorder  . Chronic inflammatory demyelinating polyneuropathy  . Acute urinary retention  . Neurogenic bladder  . Constipation  . CIDP (chronic inflammatory demyelinating polyneuropathy)   Past Medical History:  Past Medical History  Diagnosis Date  . Coronary artery disease   . Hyperlipidemia   . Seizure disorder   . Seizures   . Heart attack   . Fever   . Weakness   . Leg swelling     both legs  . Abdominal distension   . Abdominal pain   . Constipation   . Bruises easily   . Pleural effusion   . Small bowel obstruction   . Peripheral neuropathy    Past Surgical History:  Past Surgical History  Procedure Date  . Cardiac catheterization 01/27/2003    NORMAL LEFT VENTRICULAR SIZE WITH MILD FOCAL LATERAL WALL HYPOKINESIA. EF 60%  . Coronary artery bypass graft 2004    LIMA GRAFT TO THE LAD, SAPHENOUS VEIN GRAFT TO THE DIAGONAL, SAPHENOUS VEIN GRAFT TO THE FIRST OM, SAPHENOUS VEIN GRAFT TO THE PDA  . Transurethral resection of prostate   . Hand surgery 2000    left  . Abdominal surgery 05/2011    bowel obstruction  . Ankle fracture surgery 1997    left   Social History:  reports that he quit smoking about 47 years ago. His smoking use included Cigarettes. He has a 5 pack-year smoking history. He has never used smokeless tobacco. He reports that he drinks alcohol. He reports that he does not use illicit drugs.  Family / Support Systems Marital Status: Married Patient Roles: Spouse;Parent Spouse/Significant Other: Edith Lord @ [phone] or [phone] Children: pt and  wife have 4 adult children, however, only one is in [facility]) Anticipated Caregiver: Wife Ability/Limitations of Caregiver: none Caregiver Availability: 24/7 Family Dynamics: pt describes wife as extremenly supportive and involved  Social History Preferred language: English Religion: Catholic Cultural Background: NA Education: college Read: Yes Write: Yes Employment Status: Retired Age Retired: 75  Fish farm manager Issues: none Guardian/Conservator: none   Abuse/Neglect Physical Abuse: Denies Verbal Abuse: Denies Sexual Abuse: Denies Exploitation of patient/patient's resources: Denies Self-Neglect: Denies  Emotional Status Pt's affect, behavior adn adjustment status: very pleasant gentleman who denies any significant emotional distress.  No s/s of depression or anxiety and no hx of this.  Good family support. Recent Psychosocial Issues: none Pyschiatric History: none Substance Abuse History: none  Patient / Family Perceptions, Expectations & Goals Pt/Family understanding of illness & functional limitations: pt able to provide detailed report of medical issues actually beginning last year with a SBO - good understanding of functional limitations Premorbid pt/family roles/activities: Active and independent in the community Anticipated changes in roles/activities/participation: Little change anticipated if reaching mod i goals Pt/family expectations/goals: hopes to reach independent level  [employer]: None Premorbid Home Care/DME Agencies: None Transportation available at discharge: yes  Discharge Planning Living Arrangements: Spouse/significant other Support Systems: Spouse/significant other;Friends/neighbors Type of Residence: Private residence Insurance Resources: [employer] Surveyor, quantity Resources: Restaurant manager, fast food Screen Referred: No Living Expenses: Dietitian  Money Management: Patient Do you have  any problems obtaining your medications?: No Home Management: pt and spouse Patient/Family Preliminary Plans: pt plans to return home with his wife assuming primary care Social Work Anticipated Follow Up Needs: HH/OP Expected length of stay: 1 week  Clinical Impression Pleasant, oriented gentleman who is very motivated for therapies.  Good family support available.  No issues of emotional distress, but will monitor.  Storm Dulski 06/21/2012, 2:33 PM

## 2012-06-21 NOTE — Evaluation (Signed)
Occupational Therapy Assessment and Plan & Session Note  Patient Details  Name: Brandon Ray MRN: [id number] Date of Birth: May 24, 1935  OT Diagnosis: acute pain and muscle weakness (generalized) Rehab Potential: Rehab Potential: Good ELOS: 5-7 days   Today's Date: 06/21/2012  Problem List:  Patient Active Problem List  Diagnosis  . Coronary artery disease  . Hyperlipidemia  . Small bowel obstruction, s/p lap LOA, c/b post op abscess  . Pleural effusion on right  . Tachycardia  . Right sided weakness  . Seizure disorder  . Chronic inflammatory demyelinating polyneuropathy  . Acute urinary retention  . Neurogenic bladder  . Constipation  . CIDP (chronic inflammatory demyelinating polyneuropathy)    Past Medical History:  Past Medical History  Diagnosis Date  . Coronary artery disease   . Hyperlipidemia   . Seizure disorder   . Seizures   . Heart attack   . Fever   . Weakness   . Leg swelling     both legs  . Abdominal distension   . Abdominal pain   . Constipation   . Bruises easily   . Pleural effusion   . Small bowel obstruction   . Peripheral neuropathy    Past Surgical History:  Past Surgical History  Procedure Date  . Cardiac catheterization 01/27/2003    NORMAL LEFT VENTRICULAR SIZE WITH MILD FOCAL LATERAL WALL HYPOKINESIA. EF 60%  . Coronary artery bypass graft 2004    LIMA GRAFT TO THE LAD, SAPHENOUS VEIN GRAFT TO THE DIAGONAL, SAPHENOUS VEIN GRAFT TO THE FIRST OM, SAPHENOUS VEIN GRAFT TO THE PDA  . Transurethral resection of prostate   . Hand surgery 2000    left  . Abdominal surgery 05/2011    bowel obstruction  . Ankle fracture surgery 1997    left    Assessment & Plan  Brandon Ray is a 77 y.o. right-handed male with history of coronary artery disease, CABG, seizure disorder and progressive polyneuropathy for which was initially treated in May of this year and followed by Dr. Krista Blue of neurology services with IVIG and subsequent developed  some rash and was discontinued. Presented 06/11/2012 with increasing numbness and weakness right upper extremity. Recent MRI of the brain showed mild chronic microvascular ischemic change no acute abnormality and MRA of the head negative. MRI lumbar spine without significant nerve root compression and cervical spine MRI with multilevel spondylosis again without significant cord compression . Neurology services follow-up. Suspect CIDP in recommended plasmapheresis x5 doses that began 06/12/2012. Noted bouts of urinary retention with increased post void residuals a Foley catheter tube had been in place and attempt voiding trial. He remains on Dilantin and phenobarbital for history of seizure disorder. Physical therapy evaluation completed 06/15/2012 with recommendations of physical medicine and rehabilitation consult to consider inpatient rehabilitation services. Patient was felt to be a good candidate for inpatient rehabilitation services and was admitted for comprehensive rehabilitation program. Patient transferred to CIR on 06/20/2012 .    Patient currently requires supervision -> steady assist with basic self-care skills and IADL secondary to muscle weakness and decreased standing balance, decreased postural control and decreased balance strategies.  Prior to hospitalization, patient could complete ADLs & IADLs independently, patient was even driving independently PTA.   Patient will benefit from skilled intervention to increase independence with basic self-care skills prior to discharge home with care partner.  Anticipate patient will require intermittent supervision and no further OT follow recommended at this time.  OT - End of Session Activity  Tolerance: Tolerates 30+ min activity with multiple rests Endurance Deficit: Yes OT Assessment Rehab Potential: Good Barriers to Discharge: None (none known at this time) OT Plan OT Frequency: 1-2 X/day, 60-90 minutes Estimated Length of Stay: 5-7 days OT  Treatment/Interventions: Balance/vestibular training;Community reintegration;Discharge planning;DME/adaptive equipment instruction;Functional mobility training;Neuromuscular re-education;Pain management;Patient/family education;Psychosocial support;Self Care/advanced ADL retraining;Skin care/wound managment;Splinting/orthotics;Therapeutic Activities;Therapeutic Exercise;UE/LE Strength taining/ROM;UE/LE Coordination activities;Wheelchair propulsion/positioning OT Recommendation Follow Up Recommendations: None (none at this time) Equipment Recommended: None recommended by OT Equipment Details: patient states he has a 3-in-1 and built in shower seats  Precautions/Restrictions  Precautions Precautions: Fall Restrictions Weight Bearing Restrictions: No  General Chart Reviewed: Yes Family/Caregiver Present: No  Pain Pain Assessment Pain Assessment: No/denies pain Pain Score: 0-No pain  Home Living/Prior Functioning Home Living Lives With: Spouse Available Help at Discharge: Family;Available 24 hours/day Type of Home: House Home Access: Stairs to enter [employer] of Steps: 1 for garage entrance, 2 for front door entrance Entrance Stairs-Rails: None Home Layout: Two level;Able to live on main level with bedroom/bathroom (second floor used mainly for guests) Bathroom Shower/Tub: Psychologist, counselling;Door (w/ 2 built in seats) Firefighter: Standard Bathroom Accessibility: Yes How Accessible: Accessible via walker Home Adaptive Equipment: Built-in shower seat;Walker - rolling;Straight cane;Bedside commode/3-in-1 IADL History Homemaking Responsibilities: Yes Laundry Responsibility: Secondary Cleaning Responsibility: Primary Shopping Responsibility: Secondary Current License: Yes Occupation: Retired Type of Occupation: Administrator, arts Leisure and Hobbies: hike, travel, visit family, Diplomatic Services operational officer, Careers information officer Prior Function Level of Independence:  Independent with basic ADLs;Independent with gait;Independent with homemaking with ambulation Able to Take Stairs?: Yes Driving: Yes Vocation: Retired  ADL - See FIM  Vision/Perception  Vision - History Baseline Vision: Wears glasses all the time Patient Visual Report: No change from baseline Vision - Assessment Eye Alignment: Within Functional Limits Perception Perception: Within Functional Limits Praxis Praxis: Intact   Cognition Overall Cognitive Status: Appears within functional limits for tasks assessed Arousal/Alertness: Awake/alert Orientation Level: Oriented X4 Memory: Appears intact Awareness: Appears intact Problem Solving: Appears intact Safety/Judgment: Appears intact  Sensation Sensation Additional Comments: patient reports burning sensation in right UE and numbness & tingling in fingertips; LUE appear intact Coordination Gross Motor Movements are Fluid and Coordinated: Yes Fine Motor Movements are Fluid and Coordinated: Yes  Motor  Motor  Motor: Other (comment) (generalized weakness, R foot drop/stepage gait)   Locomotion  Ambulation  Ambulation/Gait Assistance: 4: Min assist  Gait  Gait Pattern: Decreased dorsiflexion - left;Right hip hike;Right steppage   Trunk/Postural Assessment  Cervical Assessment  Cervical Assessment: Within Functional Limits  Thoracic Assessment  Thoracic Assessment: Within Functional Limits  Lumbar Assessment  Lumbar Assessment: Within Functional Limits   Balance  Balance  Balance Assessed: Yes  Static Sitting Balance  Static Sitting - Level of Assistance: 6: Modified independent (Device/Increase time)  Dynamic Sitting Balance  Dynamic Sitting - Level of Assistance: 5: Stand by assistance  Static Standing Balance  Static Standing - Level of Assistance: 4: Min assist  Dynamic Standing Balance  Dynamic Standing - Level of Assistance: 4: Min assist  Extremity/Trunk Assessment RUE Assessment RUE Assessment: Within  Functional Limits (5/5 strength) LUE Assessment LUE Assessment: Within Functional Limits (5/5 strength)  See FIM for current functional status  Refer to Care Plan for Long Term Goals  Recommendations for other services: None  Discharge Criteria: Patient will be discharged from OT if patient refuses treatment 3 consecutive times without medical reason, if treatment goals not met, if there is a change in medical status, if patient  makes no progress towards goals or if patient is discharged from hospital.  The above assessment, treatment plan, treatment alternatives and goals were discussed and mutually agreed upon: by patient  SESSION NOTE  [phone] - 55 Minutes Individual Therapy No complaints of pain Initial 1:1 occupational therapy evaluation completed. Focused skilled intervention on ADL retraining at shower level, functional mobility throughout room using rolling walker, dynamic standing balance/tolerance/endurance, overall activity tolerance/endurance, and grooming tasks standing at sink. At end of session left patient seated in recliner with call bell & phone within reach.   Zaiyden Strozier 06/21/2012, 10:12 AM

## 2012-06-21 NOTE — Progress Notes (Signed)
Physical Therapy Note  Patient Details  Name: Brandon Ray MRN: [id number] Date of Birth: 1935-04-17 Today's Date: 06/21/2012  [id number] (25 minutes) individual Pain: no complaint of pain Focus of treatment: Therapeutic exercises focused on bilateral quad strengthening Treatment: Sit to stand from raised bed 5 X 5 reps min focused on concentric and eccentric control. Pt required min vcs to increase forward trunk flexion sit to stand and stand to sit.   Mathhew Buysse,JIM 06/21/2012, 8:49 AM

## 2012-06-21 NOTE — Progress Notes (Signed)
Patient information reviewed and entered into eRehab system by Shona Pardo, RN, CRRN, PPS Coordinator.  Information including medical coding and functional independence measure will be reviewed and updated through discharge.     Per nursing patient was given "Data Collection Information Summary for Patients in Inpatient Rehabilitation Facilities with attached "Privacy Act Statement-Health Care Records" upon admission.  

## 2012-06-21 NOTE — Procedures (Signed)
Successful removal of Rt IJ tunneled plasmapheresis catheter No complications.  Vincent Gros [facility] 06/21/2012 2:37 PM

## 2012-06-22 ENCOUNTER — Inpatient Hospital Stay (HOSPITAL_COMMUNITY): Payer: Medicare Other | Admitting: Occupational Therapy

## 2012-06-22 ENCOUNTER — Inpatient Hospital Stay (HOSPITAL_COMMUNITY): Payer: Medicare Other

## 2012-06-22 MED FILL — Chlorhexidine Gluconate Liquid 4%: CUTANEOUS | Qty: 30 | Status: AC

## 2012-06-22 NOTE — Progress Notes (Signed)
[facility] Individual Statement of Services  Patient Name:  Brandon Ray  Date:  06/22/2012  Welcome to the [facility].  Our goal is to provide you with an individualized program based on your diagnosis and situation, designed to meet your specific needs.  With this comprehensive rehabilitation program, you will be expected to participate in at least 3 hours of rehabilitation therapies Monday-Friday, with modified therapy programming on the weekends.  Your rehabilitation program will include the following services:  Physical Therapy (PT), Occupational Therapy (OT), 24 hour per day rehabilitation nursing, Therapeutic Recreaction (TR), Case Management (Social Worker), Rehabilitation Medicine, Nutrition Services and Pharmacy Services  Weekly team conferences will be held on Tuesdays to discuss your progress.  Your  Social Worker will talk with you frequently to get your input and to update you on team discussions.  Team conferences with you and your family in attendance may also be held.  Expected length of stay: 5-7 days  Overall anticipated outcome: modified independent  Depending on your progress and recovery, your program may change.  Your  Social Worker will coordinate services and will keep you informed of any changes.  Your  Social Worker's name and contact numbers are listed  below.  The following services may also be recommended but are not provided by the [facility] will be made to provide these services after discharge if needed.  Arrangements include referral to agencies that provide these services.  Your insurance has been verified to be:  [employer] Your primary doctor is:  Dr. Hulan Fess  Pertinent information will be shared with your doctor and your insurance  company.  Social Worker:  [place], [place] or ([phone]  Information discussed with and copy given to patient by: Lennart Pall, 06/22/2012, 3:27 PM

## 2012-06-22 NOTE — Progress Notes (Signed)
Occupational Therapy Session Notes  Patient Details  Name: Brandon Ray MRN: [id number] Date of Birth: 1935/07/17  Today's Date: 06/22/2012  Short Term Goals: Week 1:  OT Short Term Goal 1 (Week 1): Short Term Goals = Long Term Goals  Skilled Therapeutic Interventions/Progress Updates:   Session #1 [phone] - 48 Minutes Individual Therapy No complaints of pain Patient found seated in recliner upon entering the room. Patient engaged in functional ambulation throughout room in order to gather necessary items for ADL; patient required steady/min assist during functional mobility. Patient then engaged in ADL retraining at shower level. During ADL focused skilled intervention on UB/LB bathing & dressing, sit/stands, dynamic standing balance/tolerance/endurance, overall activity tolerance/endurance, functional mobility with use of rolling walker, and grooming tasks standing at sink. After ADL patient ambulated -> therapy gym using rolling walker to engage in therapeutic activities. Therapeutic activities focusing on sit/stands with & without UE support, dynamic standing balance task with & without UE support, functional strength, functional endurance, and grip strength testing. Patient's R hand grip strength = 60pounds, L hand grip strength = 80pounds. At end of session left patient seated on therapy mat in gym for next therapy session.   Session #2 [id number] - 30 Minutes Individual Therapy No complaints of pain Patient found seated in recliner. Patient transferred from recliner -> w/c with steady assist from therapist using rolling walker. Patient then propelled self from room -> central elevators. Therapist then propelled patient to outside solarium. Patient ambulated from inside -> outside ~53feet and engaged in a therapeutic activity focusing on overall activity tolerance/endurance, dynamic standing balance/tolerance/endurance with & without UE support, and functional ambulation. Patient  then ambulated back to w/c and propelled self back to 4th floor and back to room with supervision. Left patient seated in recliner with call bell & phone within reach.    Precautions:  Precautions Precautions: Fall Precaution Comments: h/o of seizure Restrictions Weight Bearing Restrictions: No  See FIM for current functional status  [name] 06/22/2012, 9:42 AM

## 2012-06-22 NOTE — Progress Notes (Signed)
Physical Therapy Session Note  Patient Details  Name: Brandon Ray MRN: [id number] Date of Birth: [birth year]  Today's Date: 06/22/2012 Time: 0930-1025 Time Calculation (min): 55 min  Short Term Goals: Week 1:  PT Short Term Goal 1 (Week 1): =LTGs  Skilled Therapeutic Interventions/Progress Updates:    Focus on balance reactions, balance training on compliant surface and Balance Zone while completing reaching outside BOS without UE support, and bouncing and tossing/catching a ball. Required overall min A for balance activities and up to mod A for recovery of LOB posterior most often. Attempted gait short distance without AD and required heavy mod A for a few steps, min A with RW and cueing for posture and tightening through hips/knees to maintain upright posture on unit.   Therapy Documentation Precautions:  Precautions Precautions: Fall Precaution Comments: h/o of seizure Restrictions Weight Bearing Restrictions: No   Pain: Pain Assessment Pain Score: 0-No pain  See FIM for current functional status  Therapy/Group: Individual Therapy  [name] [facility] 06/22/2012, 10:27 AM

## 2012-06-22 NOTE — Progress Notes (Signed)
Subjective/Complaints: IJ out. Denies pain. Pleased with therapy progress yesterday  A 12 point review of systems has been performed and if not noted above is otherwise negative.   Objective: Vital Signs: Blood pressure 117/69, pulse 78, temperature 98.4 F (36.9 C), temperature source Oral, resp. rate 20, height 6\' 1"  (1.854 m), weight 88.4 kg (194 lb 14.2 oz), SpO2 95.00%. Ir Removal Tun Cv Cath W/o Fl  06/21/2012  *RADIOLOGY REPORT*  Clinical Data: Patient with inflammatory polyneuropathy requiring plasmapheresis  REMOVAL TUNNELED CENTRAL VENOUS CATHETER  Comparison: None.  Findings: The patient's right chest and catheter were prepped and draped in the normal sterile fashion.  Heparin was removed from both ports of the catheter.  1% lidocaine was used for local anesthesia.  Using gentle blunt dissection, the cuff of the catheter was exposed, and the catheter was removed in its entirety. Pressure was held until adequate hemostasis was achieved.  Clean, sterile, occlusive dressing was applied.  The patient tolerated procedure well with no immediate complications.  IMPRESSION: Successful plasmapheresis catheter removed at bedside as described above  Read by 08/21/2012 PA-C   Original Report Authenticated By: Brayton El. Judie Petit, M.D.     Basename 06/21/12 0610 06/20/12 1330  WBC 3.9* 3.9*  HGB 12.4* 12.2*  HCT 36.4* 35.4*  PLT 97* 126*    Basename 06/21/12 0610 06/20/12 1330  NA 140 138  K 3.9 3.9  CL 105 106  CO2 28 20  GLUCOSE 105* 122*  BUN 12 14  CREATININE 0.76 0.58  CALCIUM 9.1 7.1*   CBG (last 3)  No results found for this basename: GLUCAP:3 in the last 72 hours  Wt Readings from Last 3 Encounters:  06/21/12 88.4 kg (194 lb 14.2 oz)  06/20/12 86.8 kg (191 lb 5.8 oz)  06/07/12 84.6 kg (186 lb 8.2 oz)    Physical Exam:  Constitutional: He is oriented to person, place, and time. He appears well-developed.  HENT:  Head: Normocephalic.  Eyes:  Pupils round and reactive  to light  Neck: Neck supple. No thyromegaly present. Right IJ catheter in place  Cardiovascular: Normal rate and regular rhythm.  Pulmonary/Chest: Effort normal and breath sounds normal. No respiratory distress. He has no wheezes.  Abdominal: Bowel sounds are normal. He exhibits no distension. There is no tenderness.  Musculoskeletal: He exhibits no edema.  Neurological: He is alert and oriented to person, place, and time. No cranial nerve deficit.  UE strength grossly 4-5/5. LE grossly 4 prox to 4+ distally. Stocking glove sensory loss in both limbs below the knees which has improved. Mild sensory loss over the distal right hand. DTR's are trace  Skin: Skin is warm and dry.  Psychiatric: He has a normal mood and affect. His behavior is normal. Judgment and thought content normal   Assessment/Plan: 1. 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 Physiatrist is providing close team supervision and 24 hour management of active medical problems listed below. Physiatrist and rehab team continue to assess barriers to discharge/monitor patient progress toward functional and medical goals. FIM: FIM - Bathing Bathing Steps Patient Completed: Chest;Right Arm;Left Arm;Abdomen;Front perineal area;Buttocks;Right upper leg;Left upper leg;Right lower leg (including foot);Left lower leg (including foot) Bathing: 5: Supervision: Safety issues/verbal cues  FIM - Upper Body Dressing/Undressing Upper body dressing/undressing steps patient completed: Thread/unthread right sleeve of pullover shirt/dresss;Thread/unthread left sleeve of pullover shirt/dress;Put head through opening of pull over shirt/dress;Pull shirt over trunk Upper  body dressing/undressing: 5: Set-up assist to: Obtain clothing/put away FIM - Lower Body Dressing/Undressing Lower body dressing/undressing steps patient completed: Thread/unthread right  underwear leg;Thread/unthread left underwear leg;Pull underwear up/down;Thread/unthread right pants leg;Thread/unthread left pants leg;Pull pants up/down;Don/Doff right sock;Don/Doff left sock;Don/Doff right shoe;Don/Doff left shoe;Fasten/unfasten right shoe;Fasten/unfasten left shoe Lower body dressing/undressing: 5: Supervision: Safety issues/verbal cues  FIM - Toileting Toileting steps completed by patient: Adjust clothing prior to toileting;Performs perineal hygiene;Adjust clothing after toileting Toileting Assistive Devices: Grab bar or rail for support Toileting: 6: More than reasonable amount of time  FIM - Radio producer Devices: Insurance account manager Transfers: 6-More than reasonable amt of time  FIM - Engineer, site: 5: Supine > Sit: Supervision (verbal cues/safety issues);5: Sit > Supine: Supervision (verbal cues/safety issues);4: Bed > Chair or W/C: Min A (steadying Pt. > 75%);4: Chair or W/C > Bed: Min A (steadying Pt. > 75%)  FIM - Locomotion: Wheelchair Locomotion: Wheelchair: 1: Total Assistance/staff pushes wheelchair (Pt<25%) (gait is primary means) FIM - Locomotion: Ambulation Locomotion: Ambulation Assistive Devices: Administrator Ambulation/Gait Assistance: 4: Min assist Locomotion: Ambulation: 2: Travels 50 - 149 ft with minimal assistance (Pt.>75%)  Comprehension Comprehension Mode: Auditory Comprehension: 7-Follows complex conversation/direction: With no assist  Expression Expression Mode: Verbal Expression: 7-Expresses complex ideas: With no assist  Social Interaction Social Interaction: 7-Interacts appropriately with others - No medications needed.  Problem Solving Problem Solving: 7-Solves complex problems: Recognizes & self-corrects  Memory Memory: 7-Complete Independence: No helper Medical Problem List and Plan:  1. Chronic inflammatory demyelinating polyneuropathy. 5 treatments of plasmapheresis  completed 06/20/2012  2. DVT Prophylaxis/Anticoagulation: SCDs. Monitor for signs of DVT  3. urinary retention/neurogenic bladder. Voiding trial underway and going well so far. 4. Constipation. Adjusted bowel program  5. Neuropsych: This patient is capable of making decisions on his/her own behalf.  6. CAD status post CABG 2004. Continue aspirin therapy. Patient no chest pain or shortness of breath  7. Seizure disorder. Dilantin 100 mg each bedtime and phenobarbital 97.2 mg each bedtime. Patient no recent seizure activity   LOS (Days) 2 A FACE TO FACE EVALUATION WAS PERFORMED  SWARTZ,ZACHARY T 06/22/2012, 7:08 AM

## 2012-06-22 NOTE — Progress Notes (Signed)
Physical Therapy Session Note  Patient Details  Name: KIPTON SKILLEN MRN: [id number] Date of Birth: [birth year]  Today's Date: 06/22/2012 Time: 1130-1159  Short Term Goals: Week 1:  PT Short Term Goal 1 (Week 1): =LTGs  Skilled Therapeutic Interventions/Progress Updates:    Gait to/from therapy gym with steady A, cues for posture. Nustep for endurance, strengthening, and overall activity tolerance x 10 min on level 7 with bilateral UEs and LEs, rest break and then 5 min with LE's only. Transfers with close S/steady A. Rates activity as 13-14 on Borg Scale.  Therapy Documentation Precautions:  Precautions Precautions: Fall Precaution Comments: h/o of seizure Restrictions Weight Bearing Restrictions: No   Pain: Pain Assessment Pain Score: 0-No pain Reports feeling tired from earlier sessions.   Locomotion : Ambulation Ambulation/Gait Assistance: 4: Min assist   See FIM for current functional status  Therapy/Group: Individual Therapy  [name] [facility] 06/22/2012, 11:48 AM

## 2012-06-23 ENCOUNTER — Inpatient Hospital Stay (HOSPITAL_COMMUNITY): Payer: Medicare Other | Admitting: Occupational Therapy

## 2012-06-23 ENCOUNTER — Inpatient Hospital Stay (HOSPITAL_COMMUNITY): Payer: Medicare Other

## 2012-06-23 DIAGNOSIS — Z5189 Encounter for other specified aftercare: Secondary | ICD-10-CM

## 2012-06-23 DIAGNOSIS — G6181 Chronic inflammatory demyelinating polyneuritis: Secondary | ICD-10-CM

## 2012-06-23 NOTE — Progress Notes (Signed)
Physical Therapy Session Note  Patient Details  Name: Brandon Ray MRN: [id number] Date of Birth: 05/15/1935  Today's Date: 06/23/2012 Time: 1100-1155 Time Calculation (min): 55 min  Short Term Goals: Week 1:  PT Short Term Goal 1 (Week 1): =LTGs  Skilled Therapeutic Interventions/Progress Updates:    Pt finishing on Nustep for 10 min total for endurance and functional strengthening. Simulated car transfer with overall S, discussed options for safer method to sit down first and bring legs in. Pt verbalized that is how he transfers to the passenger seat but due to his long legs in the driver seat is unable. Dynamic gait through obstacle course navigating turns, side stepping, walking on compliant surface and stepping over object with close S/steadyA. Trial with R blue rocker AFO to aid with toe clearance but made minimal difference in gait pattern and pt finds it uncomfortable. Discussed recommendation to wait until outpatient to determine if orthotics in shoe or different bracing/orthotics would be beneficial. Pt in agreement and understanding. Gait on unit with RW with close S/steady A intermittently and practice furniture transfers from low couch in family room with S, focus on eccentric control.  Therapy Documentation Precautions:  Precautions Precautions: Fall Precaution Comments: h/o of seizure Restrictions Weight Bearing Restrictions: No   Pain: Pain Assessment Pain Score: 0-No pain Faces Pain Scale: No hurt   Locomotion : Ambulation Ambulation/Gait Assistance: 4: Min guard   See FIM for current functional status  Therapy/Group: Individual Therapy  [name] [facility] 06/23/2012, 11:58 AM

## 2012-06-23 NOTE — Progress Notes (Signed)
Occupational Therapy Session Notes  Patient Details  Name: LINKIN VIZZINI MRN: [id number] Date of Birth: [birth year]  Today's Date: 06/23/2012  Short Term Goals: Week 1:  OT Short Term Goal 1 (Week 1): Short Term Goals = Long Term Goals  Skilled Therapeutic Interventions/Progress Updates:   Session #1 1000-1100 - 60 Minutes Individual Therapy No complaints of pain Patient found seated in recliner beside bed. Engaged in functional ambulation throughout room for shower transfer on/off tub transfer bench in walk-in shower. Patient completed UB/LB bathing and dressing at overall supervision level and grooming tasks standing at sink with steady assist. Patient then ambulated -> ADL apartment for simulated walk-in shower using simulated shower block. Educated patient on safety with rolling walker during walk-in shower transfers with a threshold. Patient performed transfer with supervision and cues for safety. Discussed use of hand-held shower and recommendation of installing grab bars in shower for safety. At end of session patient ambulated -> therapy gym and sat on NUSTEP while waiting on next therapy session.   Session #2 6606-3016 - 28 Minutes Individual Therapy No complaints of pain Treatment emphasis on therapeutic activity focusing on theraputty exercises & in-hand manipulation exercises for left hand. Also focused on education regarding HEP using dumbbells and theraputty. Patient ambulated <-> therapy gym using rolling walker with steady assist from therapist.   Precautions:  Precautions Precautions: Fall Precaution Comments: h/o of seizure Restrictions Weight Bearing Restrictions: No  See FIM for current functional status  Dwyane Dupree 06/23/2012, 10:17 AM

## 2012-06-23 NOTE — Progress Notes (Signed)
Occupational Therapy Session Note  Patient Details  Name: Brandon Ray MRN: [id number] Date of Birth: 01/12/1935  Today's Date: 06/23/2012 Time: 4103-0131 Time Calculation (min): 30 min  Short Term Goals: Week 1:  OT Short Term Goal 1 (Week 1): Short Term Goals = Long Term Goals  Skilled Therapeutic Interventions/Progress Updates:  Therapeutic activity to address patient's impaired balance. Ambulated with RW and supervision to therapy apartment and sat EOB.  With walker out of the way, asked patient to stand from high bed.  Balance activity caused patient to have ~12 LOB posteriorly so he sat back down on the bed each time.  Patient did not use ankle or hip strategy on any of the LOB episodes and use step strategy 2 times yet still fell back onto the bed.  After review of the 3 typical strategies for LOB, patient with fewer LOB episodes and stated, "I did better with my therapy earlier when I was not so tired".  Ambulated back to his room when her practiced sit><stands without use of arms with significant difficulty from recliner with pillows in seat and from elevated bed height.  Therapy Documentation Precautions:  Precautions Precautions: Fall Precaution Comments: h/o of seizure Restrictions Weight Bearing Restrictions: No Pain: Denies pain.  Therapy/Group: Individual Therapy  Arieliz Latino 06/23/2012, 5:04 PM

## 2012-06-23 NOTE — Progress Notes (Signed)
Subjective/Complaints: Slept well "worked on hard in therapy yestserday"  A 12 point review of systems has been performed and if not noted above is otherwise negative.   Objective: Vital Signs: Blood pressure 127/73, pulse 69, temperature 98.1 F (36.7 C), temperature source Oral, resp. rate 20, height $RemoveBe'6\' 1"'[id number]$  (1.854 m), weight 88.4 kg (194 lb 14.2 oz), SpO2 98.00%. Ir Removal Tun Cv Cath W/o Fl  06/21/2012  *RADIOLOGY REPORT*  Clinical Data: Patient with inflammatory polyneuropathy requiring plasmapheresis  REMOVAL TUNNELED CENTRAL VENOUS CATHETER  Comparison: None.  Findings: The patient's right chest and catheter were prepped and draped in the normal sterile fashion.  Heparin was removed from both ports of the catheter.  1% lidocaine was used for local anesthesia.  Using gentle blunt dissection, the cuff of the catheter was exposed, and the catheter was removed in its entirety. Pressure was held until adequate hemostasis was achieved.  Clean, sterile, occlusive dressing was applied.  The patient tolerated procedure well with no immediate complications.  IMPRESSION: Successful plasmapheresis catheter removed at bedside as described above  Read by Ascencion Dike PA-C   Original Report Authenticated By: Jerilynn Mages. Daryll Brod, M.D.     Basename 06/21/12 0610 06/20/12 1330  WBC 3.9* 3.9*  HGB 12.4* 12.2*  HCT 36.4* 35.4*  PLT 97* 126*    Basename 06/21/12 0610 06/20/12 1330  NA 140 138  K 3.9 3.9  CL 105 106  CO2 28 20  GLUCOSE 105* 122*  BUN 12 14  CREATININE 0.76 0.58  CALCIUM 9.1 7.1*   CBG (last 3)  No results found for this basename: GLUCAP:3 in the last 72 hours  Wt Readings from Last 3 Encounters:  06/21/12 88.4 kg (194 lb 14.2 oz)  06/20/12 86.8 kg (191 lb 5.8 oz)  06/07/12 84.6 kg (186 lb 8.2 oz)    Physical Exam:  Constitutional: He is oriented to person, place, and time. He appears well-developed.  HENT:  Head: Normocephalic.  Eyes:  Pupils round and reactive to light    Neck: Neck supple. No thyromegaly present. Right IJ catheter in place  Cardiovascular: Normal rate and regular rhythm.  Pulmonary/Chest: Effort normal and breath sounds normal. No respiratory distress. He has no wheezes.  Abdominal: Bowel sounds are normal. He exhibits no distension. There is no tenderness.  Musculoskeletal: He exhibits no edema.  Neurological: He is alert and oriented to person, place, and time. No cranial nerve deficit.  UE strength grossly 4-5/5. LE grossly 4 prox to 4+ distally. (he remains weak with ADF, trace on right and 1-2 on left)Stocking glove sensory loss in both limbs below the knees which has improved. Mild sensory loss over the distal right hand. DTR's are trace  Skin: Skin is warm and dry.  Psychiatric: He has a normal mood and affect. His behavior is normal. Judgment and thought content normal   Assessment/Plan: 1. 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 Physiatrist is providing close team supervision and 24 hour management of active medical problems listed below. Physiatrist and rehab team continue to assess barriers to discharge/monitor patient progress toward functional and medical goals. FIM: FIM - Bathing Bathing Steps Patient Completed: Chest;Right Arm;Left Arm;Abdomen;Right upper leg;Left upper leg;Front perineal area;Buttocks;Left lower leg (including foot);Right lower leg (including foot) Bathing: 5: Supervision: Safety issues/verbal cues  FIM - Upper Body Dressing/Undressing Upper body dressing/undressing steps patient completed: Thread/unthread right sleeve of pullover shirt/dresss;Thread/unthread left sleeve of pullover shirt/dress;Put  head through opening of pull over shirt/dress;Pull shirt over trunk Upper body dressing/undressing: 5: Supervision: Safety issues/verbal cues FIM - Lower Body Dressing/Undressing Lower body dressing/undressing  steps patient completed: Thread/unthread right underwear leg;Thread/unthread left underwear leg;Pull underwear up/down;Thread/unthread right pants leg;Thread/unthread left pants leg;Pull pants up/down;Don/Doff right sock;Don/Doff left sock;Don/Doff right shoe;Fasten/unfasten right shoe;Don/Doff left shoe;Fasten/unfasten left shoe Lower body dressing/undressing: 5: Supervision: Safety issues/verbal cues  FIM - Toileting Toileting steps completed by patient: Adjust clothing prior to toileting;Performs perineal hygiene;Adjust clothing after toileting Toileting Assistive Devices: Grab bar or rail for support Toileting: 0: Activity did not occur  FIM - Radio producer Devices: Insurance account manager Transfers: 0-Activity did not occur  FIM - Engineer, site: 5: Supine > Sit: Supervision (verbal cues/safety issues);5: Sit > Supine: Supervision (verbal cues/safety issues);4: Bed > Chair or W/C: Min A (steadying Pt. > 75%);4: Chair or W/C > Bed: Min A (steadying Pt. > 75%)  FIM - Locomotion: Wheelchair Locomotion: Wheelchair: 0: Activity did not occur (gait to/from therapy) FIM - Locomotion: Ambulation Locomotion: Ambulation Assistive Devices: Administrator Ambulation/Gait Assistance: 4: Min assist Locomotion: Ambulation: 4: Travels 150 ft or more with minimal assistance (Pt.>75%)  Comprehension Comprehension Mode: Auditory Comprehension: 7-Follows complex conversation/direction: With no assist  Expression Expression Mode: Verbal Expression: 7-Expresses complex ideas: With no assist  Social Interaction Social Interaction: 7-Interacts appropriately with others - No medications needed.  Problem Solving Problem Solving: 7-Solves complex problems: Recognizes & self-corrects  Memory Memory: 7-Complete Independence: No helper Medical Problem List and Plan:  1. Chronic inflammatory demyelinating polyneuropathy. 5 treatments of plasmapheresis  completed 06/20/2012  2. DVT Prophylaxis/Anticoagulation: SCDs. Monitor for signs of DVT  3. urinary retention/neurogenic bladder. Voiding trial underway and going well so far. 4. Constipation. Adjusted bowel program  5. Neuropsych: This patient is capable of making decisions on his/her own behalf.  6. CAD status post CABG 2004. Continue aspirin therapy. Patient denies chest pain or shortness of breath  7. Seizure disorder. Dilantin 100 mg each bedtime and phenobarbital 97.2 mg each bedtime. Patient no recent seizure activity   LOS (Days) 3 A FACE TO FACE EVALUATION WAS PERFORMED  Caedon Bond T 06/23/2012, 7:12 AM

## 2012-06-24 ENCOUNTER — Inpatient Hospital Stay (HOSPITAL_COMMUNITY): Payer: Medicare Other | Admitting: Physical Therapy

## 2012-06-24 NOTE — Progress Notes (Signed)
Subjective/Complaints: Slept well "worked on hard in therapy yestserday"  A 12 point review of systems has been performed and if not noted above is otherwise negative.   Objective: Vital Signs: Blood pressure 126/73, pulse 76, temperature 98.8 F (37.1 C), temperature source Oral, resp. rate 17, height $RemoveBe'6\' 1"'BCrWzpTUz$  (1.854 m), weight 194 lb 14.2 oz (88.4 kg), SpO2 98.00%.  Physical Exam:    well-developed well-nourished male in no acute distress. HEENT exam atraumatic, normocephalic, neck supple without jugular venous distention. Chest clear to auscultation cardiac exam S1-S2 are regular. Abdominal exam overweight with bowel sounds, soft and nontender. Extremities no edema. Neurologic exam is alert with a normal gait.  Assessment/Plan: 1. 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 Physiatrist is providing close team supervision and 24 hour management of active medical problems listed below. Physiatrist and rehab team continue to assess barriers to discharge/monitor patient progress toward functional and medical goals. Medical Problem List and Plan:  1. Chronic inflammatory demyelinating polyneuropathy. 5 treatments of plasmapheresis completed 06/20/2012  2. DVT Prophylaxis/Anticoagulation: SCDs. Monitor for signs of DVT  3. urinary retention/neurogenic bladder. Voiding trial underway and going well so far. 4. Constipation. States BMs are ok now 5. Neuropsych: This patient is capable of making decisions on his/her own behalf.  6. CAD status post CABG 2004. Continue aspirin therapy. Patient denies chest pain or shortness of breath  7. Seizure disorder. Dilantin 100 mg each bedtime and phenobarbital 97.2 mg each bedtime. Patient no recent seizure activity   LOS (Days) 4 A FACE TO [place] 06/24/2012, 9:06 AM

## 2012-06-24 NOTE — Progress Notes (Signed)
Physical Therapy Session Note  Patient Details  Name: Brandon Ray MRN: [id number] Date of Birth: 10-23-1934  Today's Date: 06/24/2012 Time: 1400-1500 Time Calculation (min): 60 min  Short Term Goals: Week 1:  PT Short Term Goal 1 (Week 1): =LTGs  Therapy Documentation Precautions:  Precautions Precautions: Fall Precaution Comments: h/o of seizure Restrictions Weight Bearing Restrictions: No Pain: No pain  Gait Training:(30') Using RW 2 x 200' S/Mod-I (R ankle lands in equinovarus)                               Up/down 3 steps using handrails S/mod-I (verbal cues for safety to advance L LE over step while descending. Patient did not note that back of heel had caught on edge of step while descending due to polyneuropathy with diminished sensation)                               Slow, high stepping in parallel bars to challenge balance with dynamic gait Therapeutic Activity; (15') Transfer training S/Mod-I assist Therapeutic Exercise:(15') Nu-Step, Level 7 x 10 minutes with one rest break   See FIM for current functional status  Therapy/Group: Individual Therapy  Gitty Osterlund J 06/24/2012, 2:10 PM

## 2012-06-25 ENCOUNTER — Inpatient Hospital Stay (HOSPITAL_COMMUNITY): Payer: Medicare Other | Admitting: Occupational Therapy

## 2012-06-25 ENCOUNTER — Inpatient Hospital Stay (HOSPITAL_COMMUNITY): Payer: Medicare Other

## 2012-06-25 NOTE — Progress Notes (Signed)
Subjective/Complaints: Feels well, no complaints   Objective: Vital Signs: Blood pressure 128/72, pulse 95, temperature 98.2 F (36.8 C), temperature source Oral, resp. rate 18, height $RemoveBe'6\' 1"'[id number]$  (1.854 m), weight 194 lb 14.2 oz (88.4 kg), SpO2 95.00%.  Physical Exam:    well-developed well-nourished male in no acute distress. HEENT exam atraumatic, normocephalic, neck supple without jugular venous distention. Chest clear to auscultation cardiac exam S1-S2 are regular. Abdominal exam overweight with bowel sounds, soft and nontender. Extremities no edema. Neurologic exam is alert  Assessment/Plan: 1. 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 Physiatrist is providing close team supervision and 24 hour management of active medical problems listed below. Physiatrist and rehab team continue to assess barriers to discharge/monitor patient progress toward functional and medical goals. Medical Problem List and Plan:  1. Chronic inflammatory demyelinating polyneuropathy. 5 treatments of plasmapheresis completed 06/20/2012 . plasmapharesis catheter removed 4 days ago per patients's report. Dressing removed today 2. DVT Prophylaxis/Anticoagulation: SCDs. Monitor for signs of DVT  3. urinary retention/neurogenic bladder. Voiding trial underway and going well so far. 4. Constipation. States BMs are ok now 5. Neuropsych: This patient is capable of making decisions on his/her own behalf.  6. CAD status post CABG 2004. Continue aspirin therapy. Patient denies chest pain or shortness of breath  7. Seizure disorder. Dilantin 100 mg each bedtime and phenobarbital 97.2 mg each bedtime. Patient no recent seizure activity   LOS (Days) 5 A FACE TO FACE EVALUATION WAS PERFORMED  SWORDS,BRUCE HENRY 06/25/2012, 11:00 AM

## 2012-06-25 NOTE — Progress Notes (Signed)
Occupational Therapy Session Note  Patient Details  Name: Brandon Ray MRN: [id number] Date of Birth: 1935/09/15  Today's Date: 06/25/2012 Time: 1441-1530 and 735-825 Time Calculation (min): 49 min and 89 total minutes  Skilled Therapeutic Interventions/Progress Updates: AM bathing/dressing with focus on dynamic balance for LB ADLs.  Patient demonstrated good, safe balance for dynamic standing and sit to stand balance for pulling up underwear and pants.  PM session:   Home skills hand held and walker level to challenge balance via home skills in ADL apartment.  Also completed squatted balance activities holding onto supportive surface further challenge and endurance    Therapy Documentation Precautions:  Precautions Precautions: Fall Precaution Comments: h/o of seizure Restrictions Weight Bearing Restrictions: No  Pain:denied   See FIM for current functional status  Therapy/Group: Individual Therapy  Alfredia Ferguson [facility] 06/25/2012, 3:45 PM

## 2012-06-25 NOTE — Progress Notes (Signed)
Physical Therapy Session Note  Patient Details  Name: KIKO RIPP MRN: [id number] Date of Birth: 1935-05-27  Today's Date: 06/25/2012 Time: 0905-0950 Time Calculation (min): 45 min  Short Term Goals: Week 1:  PT Short Term Goal 1 (Week 1): =LTGs  Skilled Therapeutic Interventions/Progress Updates:  Patient supervision sit to stand. Patient ambulated multiple times on level tile surfaces with rolling walker and supervision up to 400 feet. Patient worked on balance while weaving through obstacles and stepping over objects as well as bending to pick up objects off of the floor. Patient on Nustep for strength and endurance training x 10 minutes on work load 7. Patient reports Borg exertion at 14. Patient up and down 15 steps with bilateral rails and supervision.  Therapy Documentation Precautions:  Precautions Precautions: Fall Precaution Comments: h/o of seizure Restrictions Weight Bearing Restrictions: No Pain: Pain Assessment Pain Assessment: No/denies pain Locomotion : Ambulation Ambulation/Gait Assistance: 5: Supervision  See FIM for current functional status  Therapy/Group: Individual Therapy  Elder Love M 06/25/2012, 9:57 AM

## 2012-06-26 ENCOUNTER — Inpatient Hospital Stay (HOSPITAL_COMMUNITY): Payer: Medicare Other | Admitting: Occupational Therapy

## 2012-06-26 ENCOUNTER — Encounter (HOSPITAL_COMMUNITY): Payer: Medicare Other | Admitting: Occupational Therapy

## 2012-06-26 ENCOUNTER — Inpatient Hospital Stay (HOSPITAL_COMMUNITY): Payer: Medicare Other

## 2012-06-26 NOTE — Discharge Summary (Signed)
Brandon Ray, Brandon Ray NO.:  [id number]  MEDICAL RECORD NO.:  [id number]  LOCATION:  0102                         FACILITY:  [place]  PHYSICIAN:  Brandon Ray, M.D.DATE OF BIRTH:  03/22/1935  DATE OF ADMISSION:  06/20/2012 DATE OF DISCHARGE:  06/27/2012                              DISCHARGE SUMMARY   DISCHARGE DIAGNOSES:  Chronic inflammatory demyelinating polyneuropathy, sequential compression devices for deep vein thrombosis prophylaxis, urinary retention with neurogenic bladder, constipation, coronary artery disease with bypass grafting, and seizure disorder.  This is a 77 year old right-handed male with history of seizure disorder, as well as progressive polyneuropathy for which was initially treated in May of this and followed by Dr. Krista Ray of Neurology Services with IVIG and subsequently developed some rash and was discontinued. Presented on June 11, 2012, with increasing numbness and weakness in right upper extremity.  Recent MRI of the brain showed mild chronic microvascular ischemic change.  No acute abnormality, and MRA of the head negative.  MRI of lumbar spine without significant nerve root compression at cervical spine.  MRI with multiple level spondylosis again without significant cord compression.  Neurology Service followup suspect chronic inflammatory demyelinating polyneuropathy and recommendations of plasmapheresis x5 days that began on June 12, 2012.  Noted bouts of urinary retention with increased postvoid residual.  A Foley catheter tube had been placed and attempted voiding trial.  The patient remained on Dilantin and phenobarbital for history of seizure disorder.  Therapies ongoing with recommendations of physical Medicine Rehabilitation consult.  PAST MEDICAL HISTORY:  See discharge diagnoses.  SOCIAL HISTORY:  Lives with spouse, 1 level home.  No steps to entry.  FUNCTIONAL HISTORY PRIOR TO ADMISSION:  Able to navigate  stairs and driving.  FUNCTIONAL STATUS UPON ADMISSION TO REHABILITATION SERVICES:  Modified independent, scooting to the head of the bed, ambulating minimum moderate assist 100 feet with a rolling walker.  PHYSICAL EXAMINATION:  VITAL SIGNS:  Blood pressure 110/49, pulse 83, temperature 98.2, respirations 18. GENERAL:  This was an alert male, oriented x3, well developed. HEENT:  Pupils are round and reactive to light. LUNGS:  Clear to auscultation. CARDIAC:  Regular rate and rhythm. ABDOMEN:  Soft, nontender.  Good bowel sounds. EXTREMITIES:  Upper extremity grossly graded 4- to 5/5, lower extremity 3+ to 5 proximal to 3+ to 4 distally.  REHABILITATION HOSPITAL COURSE:  The patient was admitted to inpatient rehab services with therapies initiated on a 3-hour daily basis consisting of physical therapy, occupational therapy, and rehabilitation nursing.  The following issues were addressed during the patient's rehabilitation stay.  Pertaining to [name] chronic inflammatory demyelinating polyneuropathy.  He had completed his plasmapheresis June 20, 2012, and would follow up Neurology Services.  Sequential compression devices were in place for DVT prophylaxis.  His blood pressures remained well controlled.  He remained on Dilantin and phenobarbital for history of seizure disorder with no seizure activity noted.  Lipitor ongoing for hyperlipidemia.  Voiding trial ongoing with improvements in void.  The patient received weekly collaborative interdisciplinary team conferences to discuss estimated length of stay, family teaching, and any barriers to discharge.  He followed full commands, working with sit to stand without a rolling walker  without upper extremities.  The patient unable without use of upper extremities. His strength and endurance continued to improve throughout his rehab course.  Ambulating with a rolling walker and supervision on the unit as well in the room for  toileting, gathering clothing items, and getting dressed prior to therapy session.  Outpatient therapies would be ongoing as recommended.  Full family teaching was completed, and plan will be to discharge to home on  June 27, 2012.  DISCHARGE MEDICATIONS: 1. Aspirin 81 mg p.o. daily. 2. Lipitor 20 mg p.o. daily. 3. Folic acid 1 mg p.o. daily. 4. Multivitamin 1 tablet daily. 5. Lovaza 1 g twice daily. 6. Phenobarbital 97.2 mg daily at bedtime. 7. Dilantin extended release 100 mg at bedtime. 8. MiraLax 17 g daily with 8 ounces of water, hold for loose stool. 9. Senokot tablet 1 p.o. b.i.d..  DIET:  Regular.  SPECIAL INSTRUCTIONS:  Outpatient therapy arranged as per rehab services.  The patient would follow up with Dr. Hulan Ray on July 03, 2012, at 2 p.m.  Dr. Krista Ray, call for appointment.  Dr. Kerry Ray, appointment to be made for ongoing rehab followup.     Brandon Ray, P.A.   ______________________________ Brandon Ray, M.D.    DA/MEDQ  D:  06/26/2012  T:  06/26/2012  Job:  [id number]  cc:   Brandon Ray, M.D. Brandon Pacas, MD

## 2012-06-26 NOTE — Progress Notes (Signed)
Social Work Patient ID: Brandon Ray, male   DOB: [birth year], 77 y.o.   MRN: [id number]  Alerted by PT and OT this morning that they feel pt will be ready for d/c tomorrow.  Have spoken with Dr. Naaman Plummer who is in agreement and with recommendation for OPPT only. Pt and wife very pleased and also agreeable.  Have left VM for insurance CM about d/c plans.  Onya Eutsler

## 2012-06-26 NOTE — Progress Notes (Signed)
Physical Therapy Session Note  Patient Details  Name: Brandon Ray MRN: [id number] Date of Birth: Mar 24, 1935  Today's Date: 06/26/2012 Time: 5825-1898 Time Calculation (min): 55 min  Short Term Goals: Week 1:  PT Short Term Goal 1 (Week 1): =LTGs  Skilled Therapeutic Interventions/Progress Updates:    Discussed option for a community outing to work on community re-integration and pt with limited interest. States he would rather go to a baseball or basketball game. Worked on sit to stands without RW and without UEs, pt unable without use of UEs (used momentum and poor control/safety). With use of UEs x 10 reps x 2 sets with focus on controlling movement an eccentric control from stand to sit. [employer] for functional strengthening in seated (2 min) and standing (2 min x 3 reps) positions. Also utilized [employer] for balance training in standing position with UE support and then no UE support to maintain balance and work on balance reactions and proprioception. Gait with RW with S on unit and in pt room for toileting, gathering clothing items, and getting dressed prior to therapy session.   Therapy Documentation Precautions:  Precautions Precautions: Fall Precaution Comments: h/o of seizure Restrictions Weight Bearing Restrictions: No   Pain:  Denies pain.   See FIM for current functional status  Therapy/Group: Individual Therapy  [name] [facility] 06/26/2012, 9:36 AM

## 2012-06-26 NOTE — Progress Notes (Signed)
Occupational Therapy Session Notes & Discharge Summary  Patient Details  Name: Brandon Ray MRN: [id number] Date of Birth: 01-05-1935  Today's Date: 06/26/2012  SESSION NOTES  Session #1 [id number] - 61 Minutes Individual Therapy No complaints of pain Patient found seated in recliner. Treatment emphasis on ADL retraining at shower level. Focused skilled intervention on functional ambulation/mobility throughout room using rolling walker, dynamic standing balance, UB/LB bathing & dressing, grooming tasks standing at sink, and overall activity tolerance/endurance. Patient functioning at an overall modified independent level. At end of session gave patient two 5lb dumbbells for HEP while in room. Patient left seated in recliner with call bell & phone within reach.   Session #2 [id number] - 57 Minutes Individual Therapy No complaints of pain Treatment emphasis on community integration activity. Patient propelled self from room -> 1st floor atrium area. Patient then ambulated around hospital gift shop using rolling walker and on various surfaces outside using rolling walker. Patient took one seated rest break for ~3 minutes and ambulated back to 4th floor using rolling walker. Patient requested to get on NUSTEP at end of session and wait on next therapist.   --------------------------------------------------------------------------------------------------------------------  DISCHARGE SUMMARY Patient has met 10 of 10 long term goals due to improved activity tolerance, improved balance, postural control, ability to compensate for deficits, functional use of  RIGHT upper and LEFT upper extremity, improved attention, improved awareness and improved coordination.  Patient to discharge at overall Modified Independent level.    Reasons goals not met: n/a, all goals met at this time.  Recommendation: No additional occupational therapy recommended at this time. Patient performing at an overall  modified independent level for BADLs and IADLs.   Equipment: No equipment provided; patient reports he has BSC and built-in shower seats.  Reasons for discharge: treatment goals met and discharge from hospital  Patient/family agrees with progress made and goals achieved: Yes  Precautions/Restrictions  Precautions Precautions: Fall Restrictions Weight Bearing Restrictions: No  Pain Pain Assessment Pain Assessment: No/denies pain Pain Score: 0-No pain Faces Pain Scale: No hurt  ADL - see FIM  Vision/Perception  Vision - History Baseline Vision: Wears glasses all the time Patient Visual Report: No change from baseline Vision - Assessment Eye Alignment: Within Functional Limits Perception Perception: Within Functional Limits Praxis Praxis: Intact   Cognition Overall Cognitive Status: Appears within functional limits for tasks assessed Arousal/Alertness: Awake/alert Orientation Level: Oriented X4 Memory: Appears intact Awareness: Appears intact Problem Solving: Appears intact Safety/Judgment: Appears intact  Sensation Sensation Additional Comments: patient reports numbness and tingling is "getting better"/is better than when admitted to CIR Coordination Gross Motor Movements are Fluid and Coordinated: Yes Fine Motor Movements are Fluid and Coordinated: Yes  Motor - See Discharge Navigator    Mobility - See Discharge Navigator  Trunk/Postural Assessment- See Discharge Navigator   Balance- See Discharge Navigator  Extremity/Trunk Assessment RUE Assessment RUE Assessment: Within Functional Limits LUE Assessment LUE Assessment: Within Functional Limits  See FIM for current functional status  Micheala Morissette 06/26/2012, 12:02 PM

## 2012-06-26 NOTE — Discharge Summary (Signed)
Discharge summary job # [id number]

## 2012-06-26 NOTE — Progress Notes (Signed)
Subjective/Complaints: Sensation getting better in limbs A 12 point review of systems has been performed and if not noted above is otherwise negative.   Objective: Vital Signs: Blood pressure 139/58, pulse 98, temperature 98.1 F (36.7 C), temperature source Oral, resp. rate 18, height $RemoveBe'6\' 1"'NvyhVSMSt$  (1.854 m), weight 88.4 kg (194 lb 14.2 oz), SpO2 98.00%. No results found. No results found for this basename: WBC:2,HGB:2,HCT:2,PLT:2 in the last 72 hours No results found for this basename: NA:2,K:2,CL:2,CO2:2,GLUCOSE:2,BUN:2,CREATININE:2,CALCIUM:2 in the last 72 hours CBG (last 3)  No results found for this basename: GLUCAP:3 in the last 72 hours  Wt Readings from Last 3 Encounters:  06/21/12 88.4 kg (194 lb 14.2 oz)  06/20/12 86.8 kg (191 lb 5.8 oz)  06/07/12 84.6 kg (186 lb 8.2 oz)    Physical Exam:  Constitutional: He is oriented to person, place, and time. He appears well-developed.  HENT:  Head: Normocephalic.  Eyes:  Pupils round and reactive to light  Neck: Neck supple. No thyromegaly present. Right IJ catheter in place  Cardiovascular: Normal rate and regular rhythm.  Pulmonary/Chest: Effort normal and breath sounds normal. No respiratory distress. He has no wheezes.  Abdominal: Bowel sounds are normal. He exhibits no distension. There is no tenderness.  Musculoskeletal: He exhibits no edema.  Neurological: He is alert and oriented to person, place, and time. No cranial nerve deficit.  UE strength grossly 4-5/5. LE grossly 4 prox to 4+ distally. (he remains weak with ADF, trace on right and 1-2 on left)Stocking glove sensory loss in both limbs below the knees which has improved. Mild sensory loss over the distal right hand. DTR's are trace which continues to improve Skin: Skin is warm and dry.  Psychiatric: He has a normal mood and affect. His behavior is normal. Judgment and thought content normal   Assessment/Plan: 1. 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 Physiatrist is providing close team supervision and 24 hour management of active medical problems listed below. Physiatrist and rehab team continue to assess barriers to discharge/monitor patient progress toward functional and medical goals. FIM: FIM - Bathing Bathing Steps Patient Completed: Chest;Right Arm;Left Arm;Abdomen;Front perineal area;Left upper leg;Buttocks;Right upper leg;Right lower leg (including foot);Left lower leg (including foot) (in room shower with lateral leans for periarea onshowerchair) Bathing: 5: Supervision: Safety issues/verbal cues  FIM - Upper Body Dressing/Undressing Upper body dressing/undressing steps patient completed: Thread/unthread right sleeve of pullover shirt/dresss;Thread/unthread left sleeve of pullover shirt/dress;Put head through opening of pull over shirt/dress;Pull shirt over trunk Upper body dressing/undressing: 5: Supervision: Safety issues/verbal cues FIM - Lower Body Dressing/Undressing Lower body dressing/undressing steps patient completed: Thread/unthread right underwear leg;Thread/unthread left underwear leg;Pull underwear up/down;Thread/unthread right pants leg;Thread/unthread left pants leg;Pull pants up/down;Fasten/unfasten pants;Don/Doff right sock;Don/Doff left sock;Don/Doff right shoe;Don/Doff left shoe;Fasten/unfasten right shoe;Fasten/unfasten left shoe Lower body dressing/undressing: 5: Supervision: Safety issues/verbal cues  FIM - Toileting Toileting steps completed by patient: Adjust clothing prior to toileting;Performs perineal hygiene;Adjust clothing after toileting Toileting Assistive Devices: Grab bar or rail for support Toileting: 0: Activity did not occur  FIM - Radio producer Devices: Grab bars Toilet Transfers: 5-To toilet/BSC: Supervision (verbal cues/safety issues);5-From toilet/BSC: Supervision (verbal cues/safety  issues)  FIM - Bed/Chair Transfer Bed/Chair Transfer Assistive Devices: Copy: 5: Bed > Chair or W/C: Supervision (verbal cues/safety issues);5: Chair or W/C > Bed: Supervision (verbal cues/safety issues)  FIM - Locomotion: Wheelchair Locomotion: Wheelchair: 0: Activity did not occur FIM -  Locomotion: Ambulation Locomotion: Ambulation Assistive Devices: Administrator Ambulation/Gait Assistance: 5: Supervision Locomotion: Ambulation: 5: Travels 150 ft or more with supervision/safety issues  Comprehension Comprehension Mode: Auditory Comprehension: 7-Follows complex conversation/direction: With no assist  Expression Expression Mode: Verbal Expression: 7-Expresses complex ideas: With no assist  Social Interaction Social Interaction: 7-Interacts appropriately with others - No medications needed.  Problem Solving Problem Solving: 6-Solves complex problems: With extra time  Memory Memory: 7-Complete Independence: No helper Medical Problem List and Plan:  1. Chronic inflammatory demyelinating polyneuropathy. 5 treatments of plasmapheresis completed 06/20/2012  2. DVT Prophylaxis/Anticoagulation: SCDs. Monitor for signs of DVT  3. urinary retention/neurogenic bladder. Voiding trial underway and going well so far. 4. Constipation. Adjusted bowel program  5. Neuropsych: This patient is capable of making decisions on his/her own behalf.  6. CAD status post CABG 2004. Continue aspirin therapy. Patient denies chest pain or shortness of breath  7. Seizure disorder. Dilantin 100 mg each bedtime and phenobarbital 97.2 mg each bedtime. Patient no recent seizure activity   LOS (Days) 6 A FACE TO FACE EVALUATION WAS PERFORMED  Leslea Vowles T 06/26/2012, 6:32 AM

## 2012-06-26 NOTE — Progress Notes (Signed)
Physical Therapy Discharge Summary  Patient Details  Name: Brandon Ray MRN: [id number] Date of Birth: [birth year]  Today's Date: 06/26/2012 Time: 1696-7893 Time Calculation (min): 45 min Individual therapy. No reports of pain. Finished Berg Balance Test (see below for details) and discussed results/recommendations. The rest of the treatment focused on household mobility with RW in ADL apartment and bed mobility (mod I), up/down flight of stairs (S), reviewed and educated on [place] and other exercises pt already had been doing at home (modifications made as needed), and overall discussion of community re-integration/discharge planning. Pt feels very prepared for d/c home tomorrow and reports no further questions for therapist. Pt is mod I in room using RW.   Patient has met 8 of 8 long term goals due to improved activity tolerance, improved balance and ability to compensate for deficits.  Patient to discharge at an ambulatory level Modified Independent with RW. Recommend supervision for stair negotiation at this time.  Reasons goals not met: n/a. All goals met.  Recommendation:  Patient will benefit from ongoing skilled PT services in outpatient setting to continue to advance safe functional mobility, address ongoing impairments in higher level balance and gait, muscular strength and endurance, and minimize fall risk.  Equipment: No equipment provided. Pt already owns RW.  Reasons for discharge: treatment goals met and discharge from hospital  Patient/family agrees with progress made and goals achieved: Yes  PT Discharge Precautions/Restrictions Precautions Precautions: Fall Precaution Comments: h/o of seizure   Pain  No complaints of pain.  Vision/Perception  Vision - History Baseline Vision: Wears glasses all the time Perception Perception: Within Functional Limits Praxis Praxis: Intact  Cognition Overall Cognitive Status: Appears within functional limits for tasks  assessed Sensation Sensation Light Touch: Impaired Detail Light Touch Impaired Details: Impaired RLE;Impaired LLE Coordination Gross Motor Movements are Fluid and Coordinated: Yes Motor  Motor Motor: Other (comment) (same as admission - mod I with RW)    Locomotion  Ambulation Ambulation/Gait Assistance: 6: Modified independent (Device/Increase time)  Trunk/Postural Assessment  Cervical Assessment Cervical Assessment: Within Functional Limits Thoracic Assessment Thoracic Assessment: Within Functional Limits Lumbar Assessment Lumbar Assessment: Within Functional Limits  Balance Standardized Balance Assessment Standardized Balance Assessment: Berg Balance Test Berg Balance Test Sit to Stand: Able to stand  independently using hands Standing Unsupported: Able to stand 2 minutes with supervision Sitting with Back Unsupported but Feet Supported on Floor or Stool: Able to sit safely and securely 2 minutes Stand to Sit: Sits safely with minimal use of hands Transfers: Able to transfer safely, definite need of hands Standing Unsupported with Eyes Closed: Able to stand 10 seconds with supervision Standing Ubsupported with Feet Together: Able to place feet together independently and stand for 1 minute with supervision From Standing, Reach Forward with Outstretched Arm: Can reach forward >5 cm safely (2") From Standing Position, Pick up Object from Floor: Able to pick up shoe, needs supervision From Standing Position, Turn to Look Behind Over each Shoulder: Turn sideways only but maintains balance Turn 360 Degrees: Needs assistance while turning Standing Unsupported, Alternately Place Feet on Step/Stool: Needs assistance to keep from falling or unable to try Standing Unsupported, One Foot in Front: Loses balance while stepping or standing Standing on One Leg: Unable to try or needs assist to prevent fall Total Score: 30  Static Sitting Balance Static Sitting - Level of Assistance: 7:  Independent Dynamic Sitting Balance Dynamic Sitting - Level of Assistance: 7: Independent Static Standing Balance Static Standing - Level of Assistance:  6: Modified independent (Device/Increase time) (with RW; S without) Dynamic Standing Balance Dynamic Standing - Level of Assistance: 6: Modified independent (Device/Increase time) (with RW; mod A without UE support) Extremity Assessment      RLE Assessment RLE Assessment:  (same as admission; muscular endurance increased) LLE Assessment LLE Assessment:  (same as admission; muscular endurance increase)  See FIM for current functional status  [name] [facility] 06/26/2012, 4:27 PM

## 2012-06-27 ENCOUNTER — Inpatient Hospital Stay (HOSPITAL_COMMUNITY): Payer: Medicare Other

## 2012-06-27 ENCOUNTER — Encounter (HOSPITAL_COMMUNITY): Payer: Medicare Other | Admitting: Occupational Therapy

## 2012-06-27 ENCOUNTER — Inpatient Hospital Stay (HOSPITAL_COMMUNITY): Payer: Medicare Other | Admitting: Occupational Therapy

## 2012-06-27 DIAGNOSIS — Z5189 Encounter for other specified aftercare: Secondary | ICD-10-CM

## 2012-06-27 DIAGNOSIS — G6181 Chronic inflammatory demyelinating polyneuritis: Secondary | ICD-10-CM

## 2012-06-27 MED ORDER — SENNA 8.6 MG PO TABS: 1.0000 | ORAL_TABLET | Freq: Two times a day (BID) | ORAL | Status: DC

## 2012-06-27 NOTE — Patient Care Conference (Signed)
Inpatient RehabilitationTeam Conference Note Date: 06/27/2012   Time: 2:00 PM    Patient Name: Brandon Ray      Medical Record Number: [id number]  Date of Birth: 1935/01/07 Sex: Male         Room/Bed: 4005/4005-01 Payor Info: Payor: [employer] [place] [employer]ICARE  Plan: BLUE MEDICARE [facility]  Product Type: *No Product type*     Admitting Diagnosis: DEMYELINATING POLYNEUROPATHY  Admit Date/Time:  06/20/2012  4:21 PM Admission Comments: No comment available   Primary Diagnosis:  CIDP (chronic inflammatory demyelinating polyneuropathy) Principal Problem: CIDP (chronic inflammatory demyelinating polyneuropathy)  Patient Active Problem List   Diagnosis Date Noted  . CIDP (chronic inflammatory demyelinating polyneuropathy) 06/21/2012  . Constipation 06/20/2012  . Chronic inflammatory demyelinating polyneuropathy 06/12/2012  . Acute urinary retention 06/12/2012  . Neurogenic bladder 06/12/2012  . Right sided weakness 06/07/2012  . Seizure disorder 06/07/2012  . Pleural effusion on right 07/22/2011  . Tachycardia 07/22/2011  . Small bowel obstruction, s/p lap LOA, c/b post op abscess 07/02/2011  . Coronary artery disease   . Hyperlipidemia     Expected Discharge Date:  06/27/2012  Team Members Present:       Current Status/Progress Goal Weekly Team Focus  Medical   cidp. has improved with plasmapharesis.   pain control, improve balance  finalize outpt neuor mgt plan   Bowel/Bladder   Continent of bowel and bladder. LBM 06/26/12  Continent of bowel and bladder  Monitor   Swallow/Nutrition/ Hydration             ADL's   Mod I  Mod I  D/C planning   Mobility   mod I with RW overall  mod I with RW overall  d/c goals met   Communication             Safety/Cognition/ Behavioral Observations            Pain   No c/o pain  <3  Monitor   Skin   CDI  CDI  Encourage turn q2hrs    Rehab Goals Patient on target to meet rehab goals: Yes *See Interdisciplinary  Assessment and Plan and progress notes for long and short-term goals  Barriers to Discharge: none    Possible Resolutions to Barriers:  none    Discharge Planning/Teaching Needs:  home with wife who can provide 24/7 assistance      Team Discussion:  Excellent gains.  No concerns and ready for d/c  Revisions to Treatment Plan:  None   Continued Need for Acute Rehabilitation Level of Care: The patient requires daily medical management by a physician with specialized training in physical medicine and rehabilitation for the following conditions: Daily direction of a multidisciplinary physical rehabilitation program to ensure safe treatment while eliciting the highest outcome that is of practical value to the patient.: Yes Daily medical management of patient stability for increased activity during participation in an intensive rehabilitation regime.: Yes Daily analysis of laboratory values and/or radiology reports with any subsequent need for medication adjustment of medical intervention for : Post surgical problems;Neurological problems;Other  Margaretha Mahan 06/28/2012, 11:20 AM

## 2012-06-27 NOTE — Progress Notes (Signed)
Social Work  Discharge Note  The overall goal for the admission was met for:   Discharge location: Yes - home with wife who is available to provide 24/7 assistance  Length of Stay: Yes - 7 days  Discharge activity level: Yes - modified independent   Home/community participation: Yes  Services provided included: MD, RD, PT, OT, RN, TR, Pharmacy and SW  Financial Services: Medicare (*Blue Medicare)  Follow-up services arranged: Outpatient: PT via [facility] [employer] and Patient/Family has no preference for HH/DME agencies  Comments (or additional information):  Patient/Family verbalized understanding of follow-up arrangements: Yes  Individual responsible for coordination of the follow-up plan: patient  Confirmed correct DME delivered:  NA - had all needed DME  Latayna Ritchie

## 2012-06-27 NOTE — Progress Notes (Signed)
Pt discharged home with family. Discharge instructions provided by Silvestre Mesi, PA. All questions answered. Pt escorted off unit in w/c with family.

## 2012-06-27 NOTE — Progress Notes (Signed)
Subjective/Complaints: Excited to go home   A 12 point review of systems has been performed and if not noted above is otherwise negative.   Objective: Vital Signs: Blood pressure 147/62, pulse 82, temperature 97.7 F (36.5 C), temperature source Oral, resp. rate 19, height 6\' 1"  (1.854 m), weight 88.4 kg (194 lb 14.2 oz), SpO2 98.00%. No results found. No results found for this basename: WBC:2,HGB:2,HCT:2,PLT:2 in the last 72 hours No results found for this basename: NA:2,K:2,CL:2,CO2:2,GLUCOSE:2,BUN:2,CREATININE:2,CALCIUM:2 in the last 72 hours CBG (last 3)  No results found for this basename: GLUCAP:3 in the last 72 hours  Wt Readings from Last 3 Encounters:  06/21/12 88.4 kg (194 lb 14.2 oz)  06/20/12 86.8 kg (191 lb 5.8 oz)  06/07/12 84.6 kg (186 lb 8.2 oz)    Physical Exam:  Constitutional: He is oriented to person, place, and time. He appears well-developed.  HENT:  Head: Normocephalic.  Eyes:  Pupils round and reactive to light  Neck: Neck supple. No thyromegaly present. Right IJ catheter in place  Cardiovascular: Normal rate and regular rhythm.  Pulmonary/Chest: Effort normal and breath sounds normal. No respiratory distress. He has no wheezes.  Abdominal: Bowel sounds are normal. He exhibits no distension. There is no tenderness.  Musculoskeletal: He exhibits no edema.  Neurological: He is alert and oriented to person, place, and time. No cranial nerve deficit.  UE strength grossly 4-5/5. LE grossly 4 prox to 4+ distally. (he remains weak with ADF, trace on right and 1-2 on left)Stocking glove sensory loss in both limbs below the knees which has improved. Mild sensory loss over the distal right hand. DTR's are trace which continues to improve Skin: Skin is warm and dry.  Psychiatric: He has a normal mood and affect. His behavior is normal. Judgment and thought content normal   Assessment/Plan: 1. 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 Physiatrist is providing close team supervision and 24 hour management of active medical problems listed below. Physiatrist and rehab team continue to assess barriers to discharge/monitor patient progress toward functional and medical goals.  Needs neuro follow up and follow up with me upon dc. outpt PT arranged. FIM: FIM - Bathing Bathing Steps Patient Completed: Chest;Right Arm;Left Arm;Abdomen;Front perineal area;Buttocks;Right upper leg;Left upper leg;Right lower leg (including foot);Left lower leg (including foot) Bathing: 6: Assistive device (Comment)  FIM - Upper Body Dressing/Undressing Upper body dressing/undressing steps patient completed: Thread/unthread right sleeve of pullover shirt/dresss;Thread/unthread left sleeve of pullover shirt/dress;Put head through opening of pull over shirt/dress;Pull shirt over trunk Upper body dressing/undressing: 7: Complete Independence: No helper FIM - Lower Body Dressing/Undressing Lower body dressing/undressing steps patient completed: Thread/unthread right underwear leg;Thread/unthread left underwear leg;Pull underwear up/down;Thread/unthread right pants leg;Thread/unthread left pants leg;Pull pants up/down;Fasten/unfasten pants;Don/Doff right sock;Don/Doff left sock;Don/Doff right shoe;Don/Doff left shoe;Fasten/unfasten right shoe;Fasten/unfasten left shoe Lower body dressing/undressing: 7: Complete Independence: No helper  FIM - Toileting Toileting steps completed by patient: Adjust clothing prior to toileting;Performs perineal hygiene;Adjust clothing after toileting Toileting Assistive Devices: Grab bar or rail for support Toileting: 6: Assistive device: No helper  FIM - 06/09/12 Devices: Diplomatic Services operational officer Transfers: 6-Assistive device: No helper  FIM - Brewing technologist Devices:  Banker: 6: Assistive device: no helper  FIM - Locomotion: Wheelchair Locomotion: Wheelchair: 0: Activity did not occur FIM - Locomotion: Ambulation Locomotion: Ambulation Assistive Devices: Therapist, occupational Ambulation/Gait Assistance: 6: Modified  independent (Device/Increase time) Locomotion: Ambulation: 6: Travels 150 ft or more with assistive device/no helper  Comprehension Comprehension Mode: Auditory Comprehension: 7-Follows complex conversation/direction: With no assist  Expression Expression Mode: Verbal Expression: 7-Expresses complex ideas: With no assist  Social Interaction Social Interaction: 7-Interacts appropriately with others - No medications needed.  Problem Solving Problem Solving: 7-Solves complex problems: Recognizes & self-corrects  Memory Memory: 7-Complete Independence: No helper Medical Problem List and Plan:  1. Chronic inflammatory demyelinating polyneuropathy. 5 treatments of plasmapheresis completed 06/20/2012   -will check with neuro regarding maintenance steroid dosing for dc 2. DVT Prophylaxis/Anticoagulation: SCDs. Monitor for signs of DVT  3. urinary retention/neurogenic bladder. Emptying well 4. Constipation. Adjusted bowel program  5. Neuropsych: This patient is capable of making decisions on his/her own behalf.  6. CAD status post CABG 2004. Continue aspirin therapy. Patient denies chest pain or shortness of breath  7. Seizure disorder. Dilantin 100 mg each bedtime and phenobarbital 97.2 mg each bedtime. Patient no recent seizure activity   LOS (Days) 7 A FACE TO FACE EVALUATION WAS PERFORMED  Irven Ingalsbe T 06/27/2012, 7:47 AM

## 2012-06-28 ENCOUNTER — Encounter (HOSPITAL_COMMUNITY): Payer: Medicare Other | Admitting: Occupational Therapy

## 2012-06-28 ENCOUNTER — Inpatient Hospital Stay (HOSPITAL_COMMUNITY): Payer: Medicare Other

## 2012-06-28 ENCOUNTER — Inpatient Hospital Stay (HOSPITAL_COMMUNITY): Payer: Medicare Other | Admitting: Occupational Therapy

## 2012-06-29 ENCOUNTER — Ambulatory Visit: Payer: Medicare Other | Attending: Physical Medicine & Rehabilitation | Admitting: Physical Therapy

## 2012-06-29 ENCOUNTER — Encounter (HOSPITAL_COMMUNITY): Payer: Medicare Other | Admitting: Occupational Therapy

## 2012-06-29 ENCOUNTER — Inpatient Hospital Stay (HOSPITAL_COMMUNITY): Payer: Medicare Other | Admitting: Occupational Therapy

## 2012-06-29 ENCOUNTER — Inpatient Hospital Stay (HOSPITAL_COMMUNITY): Payer: Medicare Other

## 2012-06-29 DIAGNOSIS — IMO0001 Reserved for inherently not codable concepts without codable children: Secondary | ICD-10-CM | POA: Insufficient documentation

## 2012-06-29 DIAGNOSIS — R262 Difficulty in walking, not elsewhere classified: Secondary | ICD-10-CM | POA: Insufficient documentation

## 2012-06-29 DIAGNOSIS — M6281 Muscle weakness (generalized): Secondary | ICD-10-CM | POA: Insufficient documentation

## 2012-06-29 DIAGNOSIS — R5381 Other malaise: Secondary | ICD-10-CM | POA: Insufficient documentation

## 2012-06-30 ENCOUNTER — Encounter (HOSPITAL_COMMUNITY): Payer: Medicare Other | Admitting: Occupational Therapy

## 2012-06-30 ENCOUNTER — Inpatient Hospital Stay (HOSPITAL_COMMUNITY): Payer: Medicare Other | Admitting: Occupational Therapy

## 2012-06-30 ENCOUNTER — Inpatient Hospital Stay (HOSPITAL_COMMUNITY): Payer: Medicare Other

## 2012-07-04 ENCOUNTER — Ambulatory Visit: Payer: Medicare Other | Admitting: Physical Therapy

## 2012-07-05 ENCOUNTER — Encounter: Payer: Self-pay | Admitting: Physical Medicine & Rehabilitation

## 2012-07-05 ENCOUNTER — Encounter: Payer: Medicare Other | Attending: Physical Medicine & Rehabilitation | Admitting: Physical Medicine & Rehabilitation

## 2012-07-05 VITALS — BP 134/61 | HR 82 | Resp 14 | Ht 72.5 in | Wt 186.6 lb

## 2012-07-05 DIAGNOSIS — G6181 Chronic inflammatory demyelinating polyneuritis: Secondary | ICD-10-CM | POA: Insufficient documentation

## 2012-07-05 DIAGNOSIS — R269 Unspecified abnormalities of gait and mobility: Secondary | ICD-10-CM | POA: Insufficient documentation

## 2012-07-05 DIAGNOSIS — G40909 Epilepsy, unspecified, not intractable, without status epilepticus: Secondary | ICD-10-CM

## 2012-07-05 DIAGNOSIS — Z951 Presence of aortocoronary bypass graft: Secondary | ICD-10-CM | POA: Insufficient documentation

## 2012-07-05 DIAGNOSIS — I2510 Atherosclerotic heart disease of native coronary artery without angina pectoris: Secondary | ICD-10-CM | POA: Insufficient documentation

## 2012-07-05 DIAGNOSIS — K5900 Constipation, unspecified: Secondary | ICD-10-CM | POA: Insufficient documentation

## 2012-07-05 DIAGNOSIS — R209 Unspecified disturbances of skin sensation: Secondary | ICD-10-CM | POA: Insufficient documentation

## 2012-07-05 DIAGNOSIS — R279 Unspecified lack of coordination: Secondary | ICD-10-CM | POA: Insufficient documentation

## 2012-07-05 NOTE — Progress Notes (Signed)
Subjective:    Patient ID: Brandon Ray, male    DOB: [birth year], 77 y.o.   MRN: [id number]  HPI  Brandon Ray is back regarding his CIDP. He currently is involved with outpt PT who is addressing stamina, gait, balance, strengthening. He has followed up with Dr. Krista Blue who placed him on prednisone and made a referral to the [facility]. He has had some increase in parasthesias in the right hand over the distal palmar 2/3.  From a sensory standpoint he has numbness in the right foot. The right foot is somewhat problematic with standing and walking still. We attempted to use an AFO while on rehab, but for whatever reason, it did not turn out to provide hiim any benefit.   He occasionally has some issues emptying his bladder, but denies incontinence. He does have some constipation as well. Complains of mild numbness near the anus.   Mood, sleep, appetite have all been good. He lives with his wife who provides assistance as needed, drives him, etc.  Pain Inventory Average Pain 0 Pain Right Now 0 My pain is constant and tingling  In the last 24 hours, has pain interfered with the following? General activity 0 Relation with others 0 Enjoyment of life 0 What TIME of Ray is your pain at its worst? tingling is constant Sleep (in general) Fair  Pain is worse with: no pain Pain improves with: no pain Relief from Meds: no pain  Mobility use a walker how many minutes can you walk? 30 ability to climb steps?  yes do you drive?  yes  Function retired  Neuro/Psych weakness numbness tingling trouble walking  Prior Studies Any changes since last visit?  no  Physicians involved in your care Any changes since last visit?  no   Family History  Problem Relation Age of Onset  . Asthma Mother   . Heart disease Mother   . Coronary artery disease Father   . Heart disease Father    History   Social History  . Marital Status: Married    Spouse Name: N/A    Number of  Children: 2  . Years of Education: N/A   Occupational History  . manager     retired   Social History Main Topics  . Smoking status: Former Smoker -- 1.0 packs/Ray for 5 years    Types: Cigarettes    Quit date: 07/01/1965  . Smokeless tobacco: Never Used  . Alcohol Use: Yes  . Drug Use: No  . Sexually Active: None   Other Topics Concern  . None   Social History Narrative  . None   Past Surgical History  Procedure Date  . Cardiac catheterization 01/27/2003    NORMAL LEFT VENTRICULAR SIZE WITH MILD FOCAL LATERAL WALL HYPOKINESIA. EF 60%  . Coronary artery bypass graft 2004    LIMA GRAFT TO THE LAD, SAPHENOUS VEIN GRAFT TO THE DIAGONAL, SAPHENOUS VEIN GRAFT TO THE FIRST OM, SAPHENOUS VEIN GRAFT TO THE PDA  . Transurethral resection of prostate   . Hand surgery 2000    left  . Abdominal surgery 05/2011    bowel obstruction  . Ankle fracture surgery 1997    left   Past Medical History  Diagnosis Date  . Coronary artery disease   . Hyperlipidemia   . Seizure disorder   . Seizures   . Heart attack   . Fever   . Weakness   . Leg swelling     both legs  . Abdominal  distension   . Abdominal pain   . Constipation   . Bruises easily   . Pleural effusion   . Small bowel obstruction   . Peripheral neuropathy    BP 134/61  Pulse 82  Resp 14  Ht 6' 0.5" (1.842 m)  Wt 186 lb 9.6 oz (84.641 kg)  BMI 24.96 kg/m2  SpO2 98%    Review of Systems  Gastrointestinal: Positive for constipation.  Neurological: Positive for numbness.       Tingling  All other systems reviewed and are negative.       Objective:   Physical Exam  General: Alert and oriented x 3, No apparent distress HEENT: Head is normocephalic, atraumatic, PERRLA, EOMI, sclera anicteric, oral mucosa pink and moist, dentition intact, ext ear canals clear,  Neck: Supple without JVD or lymphadenopathy Heart: Reg rate and rhythm. No murmurs rubs or gallops Chest: CTA bilaterally without wheezes, rales, or  rhonchi; no distress Abdomen: Soft, non-tender, non-distended, bowel sounds positive. Extremities: No clubbing, cyanosis, or edema. Pulses are 2+ Skin: Clean and intact without signs of breakdown Neuro: Pt is cognitively appropriate with normal insight, memory, and awareness. Cranial nerves 2-12 are intact. Sensory exam is decreased over the dorsum of the right foot more so than the left. Also with diminished sensation over the distal 2/3 of right hand, minimal on left hand.. Reflexes are 1+ in all 4's. Fine motor coordination is intact. No tremors. Motor function is grossly 5/5 except for HI which were 4/5 and righ ADF which was 1/5, left ADF which was 2-3/5. HAB which was 4/5 as well as hip extension.Marland Kitchen He ambulates with a steppage gait pattern on there right and the ankle has poor proprioception and control when in stance. Musculoskeletal: Full ROM, No pain with AROM or PROM in the neck, trunk, or extremities. Posture appropriate Psych: Pt's affect is appropriate. Pt is cooperative         Assessment & Plan:  1. CIDP with gait and fine motor deficits as a result   Plan: 1. Prednisone and [facility] Referral per Dr. Krista Blue. He should begin the prednisone immediately 2. Continue with outpt therapies to improve gait and dexterity 3. No driving 4. Recommended the use of an ASO or over the ankle boot to better control his right foot 5. Follow up with me in about 2 months. 30 minutes of face to face patient care time were spent during this visit. All questions were encouraged and answered.

## 2012-07-05 NOTE — Patient Instructions (Signed)
TRY AN ANKLE SLEEVE OR BRACE LIKE IS SHOWED YOU TODAY  TRY HIGH-TOP SHOES OR BOOTS AS WELL  BEGIN YOUR STEROIDS AS ADVISED BY NEUROLOGY.

## 2012-07-07 ENCOUNTER — Ambulatory Visit: Payer: Medicare Other | Admitting: Physical Therapy

## 2012-07-11 ENCOUNTER — Ambulatory Visit: Payer: Medicare Other | Admitting: Physical Therapy

## 2012-07-13 ENCOUNTER — Ambulatory Visit: Payer: Medicare Other | Admitting: Physical Therapy

## 2012-07-14 ENCOUNTER — Encounter: Payer: Medicare Other | Admitting: Physical Therapy

## 2012-07-19 ENCOUNTER — Ambulatory Visit: Payer: Medicare Other | Admitting: Physical Therapy

## 2012-07-21 ENCOUNTER — Ambulatory Visit: Payer: Medicare Other | Attending: Physical Medicine & Rehabilitation | Admitting: Physical Therapy

## 2012-07-21 DIAGNOSIS — R262 Difficulty in walking, not elsewhere classified: Secondary | ICD-10-CM | POA: Insufficient documentation

## 2012-07-21 DIAGNOSIS — M6281 Muscle weakness (generalized): Secondary | ICD-10-CM | POA: Insufficient documentation

## 2012-07-21 DIAGNOSIS — IMO0001 Reserved for inherently not codable concepts without codable children: Secondary | ICD-10-CM | POA: Insufficient documentation

## 2012-07-21 DIAGNOSIS — R5381 Other malaise: Secondary | ICD-10-CM | POA: Insufficient documentation

## 2012-07-25 ENCOUNTER — Ambulatory Visit: Payer: Medicare Other | Admitting: Physical Therapy

## 2012-07-27 ENCOUNTER — Ambulatory Visit: Payer: Medicare Other | Admitting: Physical Therapy

## 2012-08-01 ENCOUNTER — Ambulatory Visit: Payer: Medicare Other | Admitting: Physical Therapy

## 2012-08-02 ENCOUNTER — Ambulatory Visit: Payer: Medicare Other | Admitting: Physical Therapy

## 2012-08-04 ENCOUNTER — Ambulatory Visit: Payer: Medicare Other | Admitting: Physical Therapy

## 2012-08-04 ENCOUNTER — Telehealth: Payer: Self-pay | Admitting: Oncology

## 2012-08-04 NOTE — Telephone Encounter (Signed)
S/W PT IN REF TO NP APPT. ON 08/10/12$RemoveBeforeDE'@3'[id number]$ :00 REFERRING DR Krista Blue DX MONO-PROTEIN MAILED NP PACKET

## 2012-08-04 NOTE — Telephone Encounter (Signed)
C/D 08/04/12 for appt.08/10/12

## 2012-08-08 ENCOUNTER — Encounter: Payer: Self-pay | Admitting: Oncology

## 2012-08-08 ENCOUNTER — Ambulatory Visit: Payer: Medicare Other | Admitting: Physical Therapy

## 2012-08-08 DIAGNOSIS — D472 Monoclonal gammopathy: Secondary | ICD-10-CM | POA: Insufficient documentation

## 2012-08-10 ENCOUNTER — Encounter: Payer: Self-pay | Admitting: Oncology

## 2012-08-10 ENCOUNTER — Ambulatory Visit (HOSPITAL_BASED_OUTPATIENT_CLINIC_OR_DEPARTMENT_OTHER): Payer: Medicare Other | Admitting: Oncology

## 2012-08-10 ENCOUNTER — Other Ambulatory Visit (HOSPITAL_BASED_OUTPATIENT_CLINIC_OR_DEPARTMENT_OTHER): Payer: Medicare Other | Admitting: Lab

## 2012-08-10 ENCOUNTER — Ambulatory Visit (HOSPITAL_BASED_OUTPATIENT_CLINIC_OR_DEPARTMENT_OTHER): Payer: Medicare Other

## 2012-08-10 ENCOUNTER — Telehealth: Payer: Self-pay | Admitting: Oncology

## 2012-08-10 VITALS — BP 146/67 | HR 84 | Temp 96.7°F | Resp 20 | Ht 72.5 in | Wt 195.9 lb

## 2012-08-10 DIAGNOSIS — G6181 Chronic inflammatory demyelinating polyneuritis: Secondary | ICD-10-CM

## 2012-08-10 DIAGNOSIS — D472 Monoclonal gammopathy: Secondary | ICD-10-CM

## 2012-08-10 DIAGNOSIS — R569 Unspecified convulsions: Secondary | ICD-10-CM

## 2012-08-10 LAB — CBC WITH DIFFERENTIAL/PLATELET
BASO%: 0.1 % (ref 0.0–2.0)
Basophils Absolute: 0 10*3/uL (ref 0.0–0.1)
HCT: 41.9 % (ref 38.4–49.9)
HGB: 14.3 g/dL (ref 13.0–17.1)
MONO#: 0.2 10*3/uL (ref 0.1–0.9)
NEUT%: 90.3 % — ABNORMAL HIGH (ref 39.0–75.0)
RDW: 14.1 % (ref 11.0–14.6)
WBC: 6.6 10*3/uL (ref 4.0–10.3)
lymph#: 0.5 10*3/uL — ABNORMAL LOW (ref 0.9–3.3)

## 2012-08-10 LAB — MORPHOLOGY: PLT EST: ADEQUATE

## 2012-08-10 NOTE — Patient Instructions (Addendum)
1.  Issue:  Gait difficulty, weakness; positive monoclonal protein. 2.  Work up:  Need to rule out multiple myeloma.   * Skeletal survey.  * Bone marrow biopsy.  * 24 hour urine collection if has not been done.  3.  Treatment:  Pending work up:   *  Possible diagnosis ranges from MGUS (monoclonal gammopathy of unknown significance) to smoldering myeloma to active myeloma.  Other related disease:  POES (polyneuropathy organomegaly endocrinopathy syndrome); light chain deposition disease, amyloidosis.    4.  Follow up after bone marrow biopsy.

## 2012-08-10 NOTE — Progress Notes (Signed)
Checked in new patient. No financial issues. °

## 2012-08-10 NOTE — Telephone Encounter (Signed)
Gave pt appt for for December 2013 MD only

## 2012-08-10 NOTE — Progress Notes (Signed)
[facility]  Telephone:([phone] [phone]     INITIAL HEMATOLOGY CONSULTATION    Referral MD:   Dr. Marcial Pacas, M.D.  Reason for Referral: positive spot urine sample for monclonal protein,     HPI: [name] is a 77 year-old man with no significant PMH.  I had the chance to review his extensive PMH from admissions, clinic notes from two different Neurology practices.  He was in [place] until about 5 years prior to presentation when he developed progressive lack of sensory to light touch in his feet bilaterally.  He was still able to ambulate well without problem including hiking which he loves doing. In 05/2011, he had a small bowel obstruction and underwent on 06/18/2011 laparoscopic lysis of adhesion and repair of small bowel enterotomy. He then developed ascending bilateral paresthesia, bilateral foot drop.  Evaluation with  Dr. Krista Blue including EMG and MRI confirmed length dependent peripheral neuropathy without issue with myopathy. He underwent treatment with IVIg in 12/2011 with extensive rash and no significant subjective improvement.  Therefore, therapy with IVIg was aborted.   In 05/2012, he received a flu shot along with DTAP.  His peripheral neuropathy extended to his waits, in addition to his right hand up to his elbow.  He could not walk and was quite deconditioned.  His diagnosis was chronic inflammatory demyelinating polyneuropathy (CIDP).  He received plasmapheresis every other day x 5 sessions.  He was started on Prednisone taper.  He also underwent rehab.   He underwent evaluation at [facility] clinic with Dr. Trinna Post.  He did extensive further work up and concurred with treatment with Prednisone.  He was noted to have positive monoclonal lambda light chain on a spot urine sample.  SPEP was reportedly negative for M-spike.  He was kindly referred to the [facility] for evaluation.    Brandon Ray presented to the clinic for the first time today  with his wife.  He reported that he is felling better now compared to 05/2012.  He thinks that he has more sensation in his bilateral feet and right hand compared to before.  He is able to ambulate now using walker.  He feels an abnormal band of tightening sensation across his chest.  With Prednisone, he feels bloated especially in his hip and abdomen.  He also has some bilateral pedal edema.  He denied PND, orthopnea, chest pain, SOB, DOE.  However, he does not exert much physical activities.    Patient denies fever, anorexia, weight loss, fatigue, headache, visual changes, confusion, drenching night sweats, palpable lymph node swelling, mucositis, odynophagia, dysphagia, nausea vomiting, jaundice, chest pain, palpitation, shortness of breath, dyspnea on exertion, productive cough, gum bleeding, epistaxis, hematemesis, hemoptysis, abdominal pain, abdominal swelling, early satiety, melena, hematochezia, hematuria, skin rash, spontaneous bleeding, joint swelling, joint pain, heat or cold intolerance, bowel bladder incontinence, back pain, depression.    Past Medical History  Diagnosis Date  . Coronary artery disease 01/2003    CABG  . Hyperlipidemia   . Seizure disorder 1960  . Weakness   . Pleural effusion 05/2011    from abdominal surgery  . Small bowel obstruction 05/2011    due to adhesion  . Peripheral neuropathy 05/2011    CIDP.  follow up with Dr. Krista Blue.   . Free monoclonal light chain     urine  :    Past Surgical History  Procedure Date  . Cardiac catheterization 01/27/2003    NORMAL LEFT VENTRICULAR SIZE  WITH MILD FOCAL LATERAL WALL HYPOKINESIA. EF 60%  . Coronary artery bypass graft 2004    LIMA GRAFT TO THE LAD, SAPHENOUS VEIN GRAFT TO THE DIAGONAL, SAPHENOUS VEIN GRAFT TO THE FIRST OM, SAPHENOUS VEIN GRAFT TO THE PDA  . Transurethral resection of prostate   . Hand surgery 2000    left  . Abdominal surgery 05/2011    bowel obstruction  . Ankle fracture surgery 1997    left   :   CURRENT MEDS: Current Outpatient Prescriptions  Medication Sig Dispense Refill  . aspirin 81 MG tablet Take 81 mg by mouth daily.       Marland Kitchen atorvastatin (LIPITOR) 20 MG tablet Take 20 mg by mouth daily.      . calcium-vitamin D (OSCAL WITH D) 500-200 MG-UNIT per tablet Take 1 tablet by mouth daily.        . Cholecalciferol (VITAMIN D) 2000 UNITS tablet Take 2,000 Units by mouth daily.        . Coenzyme Q10 (CO Q-10 PO) Take 1 tablet by mouth daily.      . folic acid (FOLVITE) 800 MCG tablet Take 800 mcg by mouth daily.       . Multiple Vitamin (MULTIVITAMIN) tablet Take 1 tablet by mouth daily.        . Multiple Vitamins-Minerals (PRESERVISION AREDS PO) Take 1 tablet by mouth 2 (two) times daily.       . Omega-3 Fatty Acids (FISH OIL) 1200 MG CAPS Take 1,200 mg by mouth daily.      Marland Kitchen PHENobarbital (LUMINAL) 97.2 MG tablet Take 97.2 mg by mouth daily.        . phenytoin (DILANTIN) 100 MG ER capsule Take 100 mg by mouth daily.       . polyethylene glycol (MIRALAX / GLYCOLAX) packet Take 17 g by mouth daily.      . predniSONE (DELTASONE) 20 MG tablet Take 20 mg by mouth. 3 tablets daily          Allergies  Allergen Reactions  . Amoxicillin Rash    Upper torso only.  :  Family History  Problem Relation Age of Onset  . Asthma Mother   . Heart disease Mother 49  . Coronary artery disease Father   . Heart disease Father   . Cancer Sister 18    breast  :  History   Social History  . Marital Status: Married    Spouse Name: N/A    Number of Children: 4  . Years of Education: N/A   Occupational History  . manager     retired, Education administrator.    Social History Main Topics  . Smoking status: Former Smoker -- 1.0 packs/day for 5 years    Types: Cigarettes    Quit date: 07/01/1965  . Smokeless tobacco: Never Used  . Alcohol Use: No  . Drug Use: No  . Sexually Active: Not on file   Other Topics Concern  . Not on file   Social History Narrative  . No  narrative on file  :  REVIEW OF SYSTEM:  The rest of the 14-point review of sytem was negative.   Exam: ECOG 0  General:  well-nourished man, in no acute distress.  Eyes:  no scleral icterus.  ENT:  There were no oropharyngeal lesions.  Neck was without thyromegaly.  Lymphatics:  Negative cervical, supraclavicular or axillary adenopathy.  Respiratory: lungs were clear bilaterally without wheezing or crackles.  Cardiovascular:  Regular rate and  rhythm, S1/S2, without murmur, rub or gallop.  There was trace bilateral pedal edema.  GI:  abdomen was soft, flat, nontender, nondistended, without organomegaly. He did not appear to have buffalo hump; however, his hip appeared fuller compared to the rest of his body.  Muscoloskeletal:  no spinal tenderness of palpation of vertebral spine.  Skin exam was without echymosis, petichae.  Neuro exam was notable to CN II-XII grossly intact.  His motor strength was 5/5 bilaterally in both upper and lower extremities; proximal and distal groups.  Patient needed some assistance to get on and off exam table.  Gait was slow and mearured.  Patient was alerted and oriented.  Attention was good.   Language was appropriate.  Mood was normal without depression.  Speech was not pressured.  Thought content was not tangential.    LABS:  Lab Results  Component Value Date   WBC 6.6 08/10/2012   HGB 14.3 08/10/2012   HCT 41.9 08/10/2012   PLT 262 08/10/2012   GLUCOSE 105* 06/21/2012   ALT 14 06/21/2012   AST 18 06/21/2012   NA 140 06/21/2012   K 3.9 06/21/2012   CL 105 06/21/2012   CREATININE 0.76 06/21/2012   BUN 12 06/21/2012   CO2 28 06/21/2012   INR 1.05 06/07/2012    ASSESSMENT AND PLAN:  1.  Seizure:  On phenytoin and phenobarbital.   2.  Chronic inflammatory demyelinating polyneuropathy:  S/p IVIg in 12/2011; s/p plasmaphoresis in 05/2012; on Prednisone taper currently.   3.  Positive urine lambda light chain:    - Differential:  MGUS (monoclonal gammopathy of  unknown significance) to smoldering myeloma to active myeloma.  Other related disease:  POEMS (polyneuropathy; organomegaly; endocrinopathy; monoclonal gammopathy; skin changes); light chain deposition disease, amyloidosis.  Given the connection between polyneuropathy and M-protein, I will need to rule POEMS.  However, the pretest probability is low given that he does not meet the other criteria.   - Work up:   * Skeletal survey:  At [facility] clinic showed no lytic lesions.   * Bone marrow biopsy.  I discussed with the patient and his wife potential complications of this procedure which include but not limited to infection, bleeding, perforation.  Brandon Ray expressed informed understanding and wished to proceed.    * 24 hour urine collection to quantify the amount of light chain. If bone marrow biopsy is negative for plasmacytosis or amyloidosis, and his 24 hour urine collection shows significant lambda light chain, I may recommend renal biopsy to rule out light chain deposition disease.  -Treatment:  Pending work up:  -  Follow up after bone marrow biopsy next week.     Thank you for this referral.   The length of time of the face-to-face encounter was 60 minutes. More than 50% of time was spent counseling and coordination of care.

## 2012-08-11 ENCOUNTER — Ambulatory Visit: Payer: Medicare Other | Admitting: Physical Therapy

## 2012-08-13 ENCOUNTER — Encounter: Payer: Self-pay | Admitting: Oncology

## 2012-08-14 ENCOUNTER — Telehealth: Payer: Self-pay | Admitting: *Deleted

## 2012-08-14 ENCOUNTER — Other Ambulatory Visit: Payer: Self-pay | Admitting: Oncology

## 2012-08-14 ENCOUNTER — Telehealth: Payer: Self-pay | Admitting: Oncology

## 2012-08-14 ENCOUNTER — Ambulatory Visit: Payer: Medicare Other | Admitting: Physical Therapy

## 2012-08-14 DIAGNOSIS — D472 Monoclonal gammopathy: Secondary | ICD-10-CM

## 2012-08-14 NOTE — Telephone Encounter (Signed)
Called pt and spoke w/ wife.  Reminded of appt tomorrow for BMBx and instructed to come at 9:30 am for labs then will have biopsy.  She verbalized understanding.

## 2012-08-14 NOTE — Telephone Encounter (Signed)
Per 11.25.13 pof pt needs lab..Also stated that pt already aware to come at 9:30.Marland KitchenMarland Kitchen

## 2012-08-15 ENCOUNTER — Ambulatory Visit: Payer: Medicare Other | Admitting: Physical Therapy

## 2012-08-15 ENCOUNTER — Inpatient Hospital Stay (HOSPITAL_COMMUNITY): Admission: RE | Admit: 2012-08-15 | Payer: Self-pay | Source: Ambulatory Visit

## 2012-08-15 ENCOUNTER — Ambulatory Visit (HOSPITAL_BASED_OUTPATIENT_CLINIC_OR_DEPARTMENT_OTHER): Payer: Medicare Other | Admitting: Oncology

## 2012-08-15 ENCOUNTER — Other Ambulatory Visit (HOSPITAL_COMMUNITY)
Admission: RE | Admit: 2012-08-15 | Discharge: 2012-08-15 | Disposition: A | Discharge status: Home / Self Care | Payer: Medicare Other | Source: Ambulatory Visit | Attending: Oncology | Admitting: Oncology

## 2012-08-15 ENCOUNTER — Other Ambulatory Visit: Payer: Medicare Other

## 2012-08-15 VITALS — BP 134/67 | HR 73 | Temp 97.8°F | Resp 18

## 2012-08-15 DIAGNOSIS — E8809 Other disorders of plasma-protein metabolism, not elsewhere classified: Secondary | ICD-10-CM | POA: Insufficient documentation

## 2012-08-15 DIAGNOSIS — R803 Bence Jones proteinuria: Secondary | ICD-10-CM

## 2012-08-15 DIAGNOSIS — D729 Disorder of white blood cells, unspecified: Secondary | ICD-10-CM | POA: Insufficient documentation

## 2012-08-15 DIAGNOSIS — R809 Proteinuria, unspecified: Secondary | ICD-10-CM

## 2012-08-15 LAB — DIFFERENTIAL
Basophils Absolute: 0 10*3/uL (ref 0.0–0.1)
Basophils Relative: 0 % (ref 0–1)
Eosinophils Absolute: 0 10*3/uL (ref 0.0–0.7)
Eosinophils Relative: 0 % (ref 0–5)
Monocytes Absolute: 0.3 10*3/uL (ref 0.1–1.0)
Monocytes Relative: 3 % (ref 3–12)
Neutro Abs: 8.8 10*3/uL — ABNORMAL HIGH (ref 1.7–7.7)

## 2012-08-15 LAB — PROTEIN ELECTROPHORESIS, SERUM
Albumin ELP: 62.4 % (ref 55.8–66.1)
Alpha-1-Globulin: 4.2 % (ref 2.9–4.9)
Beta 2: 4.1 % (ref 3.2–6.5)
Beta Globulin: 6.2 % (ref 4.7–7.2)
Total Protein, Serum Electrophoresis: 6.8 g/dL (ref 6.0–8.3)

## 2012-08-15 LAB — CBC
HCT: 42.7 % (ref 39.0–52.0)
Hemoglobin: 14.3 g/dL (ref 13.0–17.0)
MCH: 31.7 pg (ref 26.0–34.0)
MCHC: 33.5 g/dL (ref 30.0–36.0)
MCV: 94.7 fL (ref 78.0–100.0)
RDW: 13.6 % (ref 11.5–15.5)

## 2012-08-15 LAB — KAPPA/LAMBDA LIGHT CHAINS
Kappa:Lambda Ratio: 0.09 — ABNORMAL LOW (ref 0.26–1.65)
Lambda Free Lght Chn: 11.2 mg/dL — ABNORMAL HIGH (ref 0.57–2.63)

## 2012-08-15 NOTE — Progress Notes (Signed)
Please see bone marrow biopsy procedure note dated same day.

## 2012-08-15 NOTE — Procedures (Signed)
   [facility]  Telephone:([phone] Fax:([phone]) [phone]   BONE MARROW BIOPSY AND ASPIRATION   INDICATION:  Positive urinary Monoclonal spike; ruing out myeloma, light chain deposition; and amyloidosis.   Procedure: After obtained from consent, Brandon Ray was placed in the prone position. Time out was performed verifying correct patient and procedure. The skin overlying the left posterior crest was prepped with Betadine and draped in the usual sterile fashion. The skin and periosteum were infiltrated with 10 mL of 2% lidocaine x 3 as he was still uncomfortable after the fist 93mL. A small puncture wound was made with #11 scalpel blade.  Bone marrow aspirate was obtained on the first pass of the aspiration needle.  Two separate core biopsies were btained through the same incision.   The aspirate was sent for routine histology, flow cytometry, myeloma FISH panel, and cytogenetics.  Core biopsy was sent for routine histology.   Brandon Ray tolerated procedure well with minimal  blood loss and without immediate complication.   A sterile dressing was applied.   Brandon Ray M.D. 08/15/2012

## 2012-08-15 NOTE — Patient Instructions (Addendum)
  [facility] Discharge Instructions for Post Bone Marrow Procedure  Today you had a bone marrow biopsy and aspirate of Left Hip.    Please keep the pressure dressing in place for at least 24 hours.  Have someone check your dressing periodically for bleeding.  If needed you can reapply a pressure dressing to the site.  Take pain medication;  Tylenol as directed.  IF BLEEDING REOCCURS THAT SHOULD BE REPORTED IMMEDIATELY. Call the [facility] at [phone] if during business hours. Or report to the Emergency Room.   I have been informed and understand all the instructions given to me. I know to contact the clinic, my physician, or go to the Emergency Department if any problems should occur. I do not have any questions at this time, but understand that I may call the clinic during office hours at [phone]  should I have any questions or need assistance in obtaining follow up care.    __________________________________________  _____________  __________ Signature of Patient or Authorized Representative            Date                   Time    __________________________________________ Nurse's Signature

## 2012-08-18 LAB — UIFE/LIGHT CHAINS/TP QN, 24-HR UR
Alpha 2, Urine: DETECTED — AB
Beta, Urine: DETECTED — AB
Free Kappa Lt Chains,Ur: 0.92 mg/dL (ref 0.14–2.42)
Free Lambda Lt Chains,Ur: 1.65 mg/dL — ABNORMAL HIGH (ref 0.02–0.67)
Free Lt Chn Excr Rate: 20.7 mg/d
Total Protein, Urine-Ur/day: 70 mg/d (ref 10–140)
Volume, Urine: 2250 mL

## 2012-08-22 ENCOUNTER — Ambulatory Visit: Payer: Medicare Other | Attending: Physical Medicine & Rehabilitation | Admitting: Physical Therapy

## 2012-08-22 DIAGNOSIS — R5381 Other malaise: Secondary | ICD-10-CM | POA: Insufficient documentation

## 2012-08-22 DIAGNOSIS — R262 Difficulty in walking, not elsewhere classified: Secondary | ICD-10-CM | POA: Insufficient documentation

## 2012-08-22 DIAGNOSIS — M6281 Muscle weakness (generalized): Secondary | ICD-10-CM | POA: Insufficient documentation

## 2012-08-22 DIAGNOSIS — IMO0001 Reserved for inherently not codable concepts without codable children: Secondary | ICD-10-CM | POA: Insufficient documentation

## 2012-08-22 NOTE — Patient Instructions (Addendum)
1.  Issue: peripheral neuropathy and low amount of positive urine monoclonal lambda light chain.  2.  Potential diagnosis:  *  Unifying diagnosis:  POEMS (Polyneuropathy, Organomegaly, Endocrinopathy, Monoclonal protein, Skin changes).  The issue with is diagnosis is that this is a very rare diagnosis.  Patient only has neuropathy and a very small amount of urinary lambda light chain. There has been no other criteria.   *  Another alternatives are combination of concurrent CIDP and MGUS (monocloncal gammopathy of unknown significance).  About 1% of patients with MGUS progresses to active myeloma per year.  Therefore, MGUS does not require treatment; just watchful observation.   *  Active myeloma was ruled out due to lack of kidney failure, anemia, bone lesions, and elevated calcium.  *  Amyloidosis: less likely due to very low amount of urine monoclonal light chain and no proteinuria in 24hour urine collection.  3.  Recommendation:   *  Referral back to neurologists to see if nerve conduction study and electromyography can distinguish CIDP from MGUS.   *  I will present case at tumor board in 2 weeks for recommendation.  *  Most likely, my recommendation will be watchful observation with measurement of urine light chain, blood count, chemistry.    *  If continues to have relapsed or worsened neuropathy, I may consider treating POEMS with oral chemo Revlimid/Dexamethasone.

## 2012-08-23 ENCOUNTER — Telehealth: Payer: Self-pay | Admitting: Oncology

## 2012-08-23 ENCOUNTER — Ambulatory Visit (HOSPITAL_BASED_OUTPATIENT_CLINIC_OR_DEPARTMENT_OTHER): Payer: Medicare Other | Admitting: Oncology

## 2012-08-23 ENCOUNTER — Other Ambulatory Visit: Payer: Self-pay | Admitting: Oncology

## 2012-08-23 VITALS — BP 124/73 | HR 93 | Temp 97.0°F | Resp 20 | Ht 72.5 in | Wt 198.3 lb

## 2012-08-23 DIAGNOSIS — R809 Proteinuria, unspecified: Secondary | ICD-10-CM

## 2012-08-23 DIAGNOSIS — E8809 Other disorders of plasma-protein metabolism, not elsewhere classified: Secondary | ICD-10-CM

## 2012-08-23 DIAGNOSIS — R803 Bence Jones proteinuria: Secondary | ICD-10-CM

## 2012-08-23 DIAGNOSIS — R198 Other specified symptoms and signs involving the digestive system and abdomen: Secondary | ICD-10-CM

## 2012-08-23 DIAGNOSIS — N62 Hypertrophy of breast: Secondary | ICD-10-CM

## 2012-08-23 NOTE — Telephone Encounter (Signed)
gv and printed pt appt schedule for Jan 2014...the patient aware central scheduling will contact him with d.t appt for U/S.Hulen Skains and s/w joann @ Dr. Baldwin Crown office...faxed referral to office..Dr. Hazel Sams pt records...gv to tiffany to send over records.

## 2012-08-23 NOTE — Progress Notes (Signed)
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   CURRENT THERAPY: pending workup.   INTERVAL HISTORY: Brandon Ray 77 y.o. male returns to clinic with his wife to discuss result of work up so far.  He reported that he has not had any more improvement of his neuropathy and strength since he last met me.  He is still able to ambulate using a cane or walker.  He needs some help with shower and getting dressed.  He denied recent fall.  He still has persistently increasing weight from edema in the hips and bilateral lower extremities.  He has not been on diuretics.  He thinks that the increasing weight is impeding with his ambulation due to shift in center of gravity. He does feel diffuse abdominal fullness; and a tightening band going all around this upper torso/chest.  He noticed gynecomastia the past 1-2 years which is worsening lately.   Patient denies fever, anorexia,headache, visual changes, confusion, drenching night sweats, palpable lymph node swelling, mucositis, odynophagia, dysphagia, nausea vomiting, jaundice, chest pain, palpitation, shortness of breath, dyspnea on exertion, productive cough, gum bleeding, epistaxis, hematemesis, hemoptysis, early satiety, melena, hematochezia, hematuria, skin rash, spontaneous bleeding, joint swelling, joint pain, heat or cold intolerance, bowel bladder incontinence.   Past Medical History  Diagnosis Date  . Coronary artery disease 01/2003    CABG  . Hyperlipidemia   . Seizure disorder 1960  . Weakness   . Pleural effusion 05/2011    from abdominal surgery  . Small bowel obstruction 05/2011    due to adhesion  . Peripheral neuropathy 05/2011    CIDP.  follow up with Dr. Krista Blue.   . Free monoclonal light chain     urine    Past Surgical History  Procedure Date  . Cardiac catheterization 01/27/2003      NORMAL LEFT VENTRICULAR SIZE WITH MILD FOCAL LATERAL WALL HYPOKINESIA. EF 60%  . Coronary artery bypass graft 2004    LIMA GRAFT TO THE LAD, SAPHENOUS VEIN GRAFT TO THE DIAGONAL, SAPHENOUS VEIN GRAFT TO THE FIRST OM, SAPHENOUS VEIN GRAFT TO THE PDA  . Transurethral resection of prostate   . Hand surgery 2000    left  . Abdominal surgery 05/2011    bowel obstruction  . Ankle fracture surgery 1997    left    Current Outpatient Prescriptions  Medication Sig Dispense Refill  . aspirin 81 MG tablet Take 81 mg by mouth daily.       Marland Kitchen atorvastatin (LIPITOR) 20 MG tablet Take 20 mg by mouth daily.      . calcium-vitamin D (OSCAL WITH D) 500-200 MG-UNIT per tablet Take 1 tablet by mouth daily.        . Cholecalciferol (VITAMIN D) 2000 UNITS tablet Take 2,000 Units by mouth daily.        . Coenzyme Q10 (CO Q-10 PO) Take 1 tablet by mouth daily.      . folic acid (FOLVITE) 454 MCG tablet Take 800 mcg by mouth daily.       . Multiple Vitamin (MULTIVITAMIN) tablet Take 1 tablet by mouth daily.        . Multiple Vitamins-Minerals (PRESERVISION AREDS PO) Take 1 tablet by mouth 2 (two) times daily.       . Omega-3 Fatty Acids (FISH OIL) 1200 MG CAPS Take 1,200 mg by mouth daily.      Marland Kitchen  PHENobarbital (LUMINAL) 97.2 MG tablet Take 97.2 mg by mouth daily.        . phenytoin (DILANTIN) 100 MG ER capsule Take 100 mg by mouth daily.       . polyethylene glycol (MIRALAX / GLYCOLAX) packet Take 17 g by mouth daily.      . predniSONE (DELTASONE) 20 MG tablet Take 20 mg by mouth. 3 tablets daily        ALLERGIES:  is allergic to amoxicillin.  REVIEW OF SYSTEMS:  The rest of the 14-point review of system was negative.   Filed Vitals:   08/23/12 0915  BP: 124/73  Pulse: 93  Temp: 97 F (36.1 C)  Resp: 20   Wt Readings from Last 3 Encounters:  08/23/12 198 lb 4.8 oz (89.948 kg)  08/10/12 195 lb 14.4 oz (88.86 kg)  07/05/12 186 lb 9.6 oz (84.641 kg)   ECOG Performance status: 2 due to  neuropathy  PHYSICAL EXAMINATION:   General: well-nourished man, in no acute distress. Eyes: no scleral icterus. ENT: There were no oropharyngeal lesions. Neck was without thyromegaly. Lymphatics: Negative cervical, supraclavicular or axillary adenopathy. Respiratory: lungs were clear bilaterally without wheezing or crackles. Cardiovascular: Regular rate and rhythm, S1/S2, without murmur, rub or gallop. There was trace bilateral pedal edema. GI: abdomen was soft, flat, nontender, nondistended, without organomegaly. He did not appear to have buffalo hump; however, his hip appeared fuller compared to the rest of his body. Muscoloskeletal: no spinal tenderness of palpation of vertebral spine. Skin exam was without echymosis, petichae. Neuro exam was notable to CN II-XII grossly intact. His motor strength was 5/5 bilaterally in both upper and lower extremities; proximal and distal groups. Patient needed some assistance to get on and off exam table. Gait was slow and mearured. Patient was alerted and oriented. Attention was good. Language was appropriate. Mood was normal without depression. Speech was not pressured. Thought content was not tangential.     LABORATORY/RADIOLOGY DATA:  Lab Results  Component Value Date   WBC 10.2 08/15/2012   HGB 14.3 08/15/2012   HCT 42.7 08/15/2012   PLT 253 08/15/2012   GLUCOSE 105* 06/21/2012   ALKPHOS 33* 06/21/2012   ALT 14 06/21/2012   AST 18 06/21/2012   NA 140 06/21/2012   K 3.9 06/21/2012   CL 105 06/21/2012   CREATININE 0.76 06/21/2012   BUN 12 06/21/2012   CO2 28 06/21/2012   INR 1.05 06/07/2012    ASSESSMENT AND PLAN:   1. Seizure: On phenytoin and phenobarbital.  2. Chronic inflammatory demyelinating polyneuropathy: S/p IVIg in 12/2011; s/p plasmaphoresis in 05/2012; on Prednisone at $RemoveBefor'60mg'XLpNWeduMLYG$  PO daily currently.  3.  Fluid retention:  Due to Prednisone.  I advised him to discuss with Dr. Krista Blue to see if diurectic is appropriate.  4.  Gynecomastia:  No clear  offending meds.  I referred pt to Dr. Baldwin Crown who also sees pt's wife to rule out endocrinopathy (please see below).   5. Positive urine lambda light chain:  - Working diagnosis:    *  Unifying diagnosis:  POEMS (Polyneuropathy, Organomegaly, Endocrinopathy, Monoclonal protein, Skin changes).  The issue with is diagnosis is that this is a very rare diagnosis.  Patient only has neuropathy and a very small amount of urinary lambda light chain. There has been no other criteria.   *  Another alternatives are combination of concurrent CIDP and MGUS (monocloncal gammopathy of unknown significance).  About 1% of patients with MGUS progresses to active myeloma  per year.  Therefore, MGUS does not require treatment; just watchful observation.   *  Active myeloma was ruled out due to lack of kidney failure, anemia, bone lesions, and elevated calcium.  *  Amyloidosis: less likely due to very low amount of urine monoclonal light chain and no proteinuria in 24hour urine collection.  Bone marrow biopsy was negative for amyloid stain.   -.  Recommendation:   *  I requested abdominal US to rule out organomegaly with would be consistent with POEMS syndrome.   *  I am trying to contact Dr. Vida Rigger from [facility] for further detail regarding his neuro work up.  Patient said that Dr. Vida Rigger thought that his nerve conduction studies did not show typical but rather atypical findings for CIPD.   *  I will present case at Hem tumor board in 2 weeks.   *  Continue observation for now.  I will see patient in early Jan 2014 with a repeat serum light chain.   *  If continues to have relapsed or worsened neuropathy, I may consider treating POEMS with oral chemo Revlimid/Dexamethasone.        The length of time of the face-to-face encounter was 40 minutes. More than 50% of time was spent counseling and coordination of care.

## 2012-08-23 NOTE — Telephone Encounter (Signed)
Correction gv referral to [employer]

## 2012-08-23 NOTE — Telephone Encounter (Signed)
Faxed pt medical records to Dr. Forde Dandy.

## 2012-08-24 ENCOUNTER — Ambulatory Visit (HOSPITAL_COMMUNITY)
Admission: RE | Admit: 2012-08-24 | Discharge: 2012-08-24 | Disposition: A | Discharge status: Home / Self Care | Payer: Medicare Other | Source: Ambulatory Visit | Attending: Oncology | Admitting: Oncology

## 2012-08-24 DIAGNOSIS — R198 Other specified symptoms and signs involving the digestive system and abdomen: Secondary | ICD-10-CM

## 2012-08-24 DIAGNOSIS — N62 Hypertrophy of breast: Secondary | ICD-10-CM

## 2012-08-24 DIAGNOSIS — R809 Proteinuria, unspecified: Secondary | ICD-10-CM | POA: Insufficient documentation

## 2012-08-24 DIAGNOSIS — R803 Bence Jones proteinuria: Secondary | ICD-10-CM

## 2012-08-25 ENCOUNTER — Ambulatory Visit: Payer: Medicare Other | Admitting: Physical Therapy

## 2012-08-28 ENCOUNTER — Ambulatory Visit: Payer: Medicare Other | Admitting: Physical Therapy

## 2012-08-28 ENCOUNTER — Telehealth: Payer: Self-pay | Admitting: *Deleted

## 2012-08-28 NOTE — Telephone Encounter (Signed)
Pt left VM earlier today states has not heard about appt w/ Dr. Forde Dandy, Endocrinology, yet.  Called pt back and left VM informing him that referral was made and it may take up to a week to get appt.  Informed him that Dr. Agustina Caroli referral requested a visit w/i 10 days and was made on 12/04.  Asked him to call us back if he doesn't hear anything by tomorrow afternoon.

## 2012-08-30 ENCOUNTER — Encounter: Payer: Medicare Other | Attending: Physical Medicine & Rehabilitation | Admitting: Physical Medicine & Rehabilitation

## 2012-08-30 ENCOUNTER — Encounter: Payer: Self-pay | Admitting: Physical Medicine & Rehabilitation

## 2012-08-30 VITALS — BP 126/47 | HR 81 | Resp 14 | Ht 73.0 in | Wt 193.0 lb

## 2012-08-30 DIAGNOSIS — Z951 Presence of aortocoronary bypass graft: Secondary | ICD-10-CM | POA: Insufficient documentation

## 2012-08-30 DIAGNOSIS — G40909 Epilepsy, unspecified, not intractable, without status epilepticus: Secondary | ICD-10-CM

## 2012-08-30 DIAGNOSIS — I2510 Atherosclerotic heart disease of native coronary artery without angina pectoris: Secondary | ICD-10-CM | POA: Insufficient documentation

## 2012-08-30 DIAGNOSIS — G6181 Chronic inflammatory demyelinating polyneuritis: Secondary | ICD-10-CM | POA: Insufficient documentation

## 2012-08-30 DIAGNOSIS — K5900 Constipation, unspecified: Secondary | ICD-10-CM | POA: Insufficient documentation

## 2012-08-30 DIAGNOSIS — R209 Unspecified disturbances of skin sensation: Secondary | ICD-10-CM | POA: Insufficient documentation

## 2012-08-30 DIAGNOSIS — R279 Unspecified lack of coordination: Secondary | ICD-10-CM | POA: Insufficient documentation

## 2012-08-30 DIAGNOSIS — R269 Unspecified abnormalities of gait and mobility: Secondary | ICD-10-CM | POA: Insufficient documentation

## 2012-08-30 DIAGNOSIS — D472 Monoclonal gammopathy: Secondary | ICD-10-CM

## 2012-08-30 NOTE — Progress Notes (Signed)
Subjective:    Patient ID: Brandon Ray, male    DOB: [birth year], 77 y.o.   MRN: [id number]  HPI  Brandon Ray is back regarding his polyneuropathy. He has been to the [facility]. His diagnosis remains inconclusive. He is walking with and without his cane. He is exercising regularly. He does fatigue sometimes. He has had two episodes where he's slid off a wood chair or the leather couch.   He has been told he may have POEMS syndrome. He goes for endocrine work up soon.  He denies pain, but does report some tingling over his trunk which is persistent.   Pain Inventory Average Pain 0 Pain Right Now 0 My pain is n/a  In the last 24 hours, has pain interfered with the following? General activity 0 Relation with others 0 Enjoyment of life 0 What TIME of day is your pain at its worst? n/a Sleep (in general) Fair  Pain is worse with: n/a Pain improves with: n/a Relief from Meds: n/a  Mobility walk without assistance use a cane how many minutes can you walk? 30-45 ability to climb steps?  yes do you drive?  no transfers alone  Function retired  Neuro/Psych numbness tingling  Prior Studies Any changes since last visit?  no  Physicians involved in your care Any changes since last visit?  no   Family History  Problem Relation Age of Onset  . Asthma Mother   . Heart disease Mother 35  . Coronary artery disease Father   . Heart disease Father   . Cancer Sister 30    breast   History   Social History  . Marital Status: Married    Spouse Name: N/A    Number of Children: 4  . Years of Education: N/A   Occupational History  . manager     retired, Personnel officer.    Social History Main Topics  . Smoking status: Former Smoker -- 1.0 packs/day for 5 years    Types: Cigarettes    Quit date: 07/01/1965  . Smokeless tobacco: Never Used  . Alcohol Use: No  . Drug Use: No  . Sexually Active: None   Other Topics Concern  . None   Social  History Narrative  . None   Past Surgical History  Procedure Date  . Cardiac catheterization 01/27/2003    NORMAL LEFT VENTRICULAR SIZE WITH MILD FOCAL LATERAL WALL HYPOKINESIA. EF 60%  . Coronary artery bypass graft 2004    LIMA GRAFT TO THE LAD, SAPHENOUS VEIN GRAFT TO THE DIAGONAL, SAPHENOUS VEIN GRAFT TO THE FIRST OM, SAPHENOUS VEIN GRAFT TO THE PDA  . Transurethral resection of prostate   . Hand surgery 2000    left  . Abdominal surgery 05/2011    bowel obstruction  . Ankle fracture surgery 1997    left   Past Medical History  Diagnosis Date  . Coronary artery disease 01/2003    CABG  . Hyperlipidemia   . Seizure disorder 1960  . Weakness   . Pleural effusion 05/2011    from abdominal surgery  . Small bowel obstruction 05/2011    due to adhesion  . Peripheral neuropathy 05/2011    CIDP.  follow up with Dr. Krista Blue.   . Free monoclonal light chain     urine   BP 126/47  Pulse 81  Resp 14  Ht $R'6\' 1"'EK$  (1.854 m)  Wt 193 lb (87.544 kg)  BMI 25.46 kg/m2  SpO2 95%  Review of Systems  Constitutional: Positive for unexpected weight change.  Neurological: Positive for numbness.  All other systems reviewed and are negative.       Objective:   Physical Exam  General: Alert and oriented x 3, No apparent distress  HEENT: Head is normocephalic, atraumatic, PERRLA, EOMI, sclera anicteric, oral mucosa pink and moist, dentition intact, ext ear canals clear,  Neck: Supple without JVD or lymphadenopathy  Heart: Reg rate and rhythm. No murmurs rubs or gallops  Chest: CTA bilaterally without wheezes, rales, or rhonchi; no distress  Abdomen: Soft, non-tender, non-distended, bowel sounds positive.  Extremities: No clubbing, cyanosis, or edema. Pulses are 2+  Skin: Clean and intact without signs of breakdown  Neuro: Pt is cognitively appropriate with normal insight, memory, and awareness. Cranial nerves 2-12 are intact. Sensory exam is decreased over the dorsum of the right foot more  so than the left. Also with diminished sensation over the distal 2/3 of right hand. Some numbness over abdomen.. Reflexes are 1+ in all 4's. Fine motor coordination is intact. No tremors. Motor function is grossly 5/5 except for HI which were 4/5 and righ ADF which was 1/5, left ADF which was 3/5. HAB which was 4/5 as well as hip extension.Marland Kitchen He ambulates with a steppage gait pattern on there right and the ankle has poor proprioception and control when in stance.  Musculoskeletal: Full ROM, No pain with AROM or PROM in the neck, trunk, or extremities. Posture appropriate  Psych: Pt's affect is appropriate. Pt is cooperative   Assessment & Plan:   1. Polyneuropathy/ ?POEMS syndrome  Plan:  1.Continued oncology and neuro work up. He appears stable neurologically. 2. Wrote an RX for a ypsilon or Toe up AFO for balance and better quality of gait 3. May practice driving with wife. If he is safe with brace on while driving, he can drive during the day in low traffic times. 4. Follow up with me in 3 months. 30 minutes of face to face patient care time were spent during this visit. All questions were encouraged and answered.

## 2012-08-30 NOTE — Patient Instructions (Signed)
Call me with any questions

## 2012-08-31 ENCOUNTER — Ambulatory Visit: Payer: Medicare Other | Admitting: Physical Therapy

## 2012-09-05 ENCOUNTER — Ambulatory Visit: Payer: Medicare Other | Admitting: Physical Therapy

## 2012-09-07 ENCOUNTER — Telehealth: Payer: Self-pay | Admitting: Oncology

## 2012-09-07 NOTE — Telephone Encounter (Signed)
I emailed Dr. Lamonte Sakai ..Marland KitchenMarland KitchenMarland KitchenMrs. Brandon Ray from Dr. Baldwin Crown office called and stated that he reviewed the pts information however would not be able to see the pt until the end of Jan.he also gave the name of another Dr. Renne Crigler that may be able to get the pt in a little faster than he would be able to...how would you like for me to address?

## 2012-09-08 ENCOUNTER — Ambulatory Visit: Payer: Medicare Other | Admitting: Physical Therapy

## 2012-09-08 IMAGING — CR DG CHEST 1V PORT
1 series · 1 of 1 positions shown · non-contrast
Comparison: CT and radiographs 07/13/2011.

CLINICAL DATA: Chest tube removal.

PORTABLE CHEST - 1 VIEW

[AP]
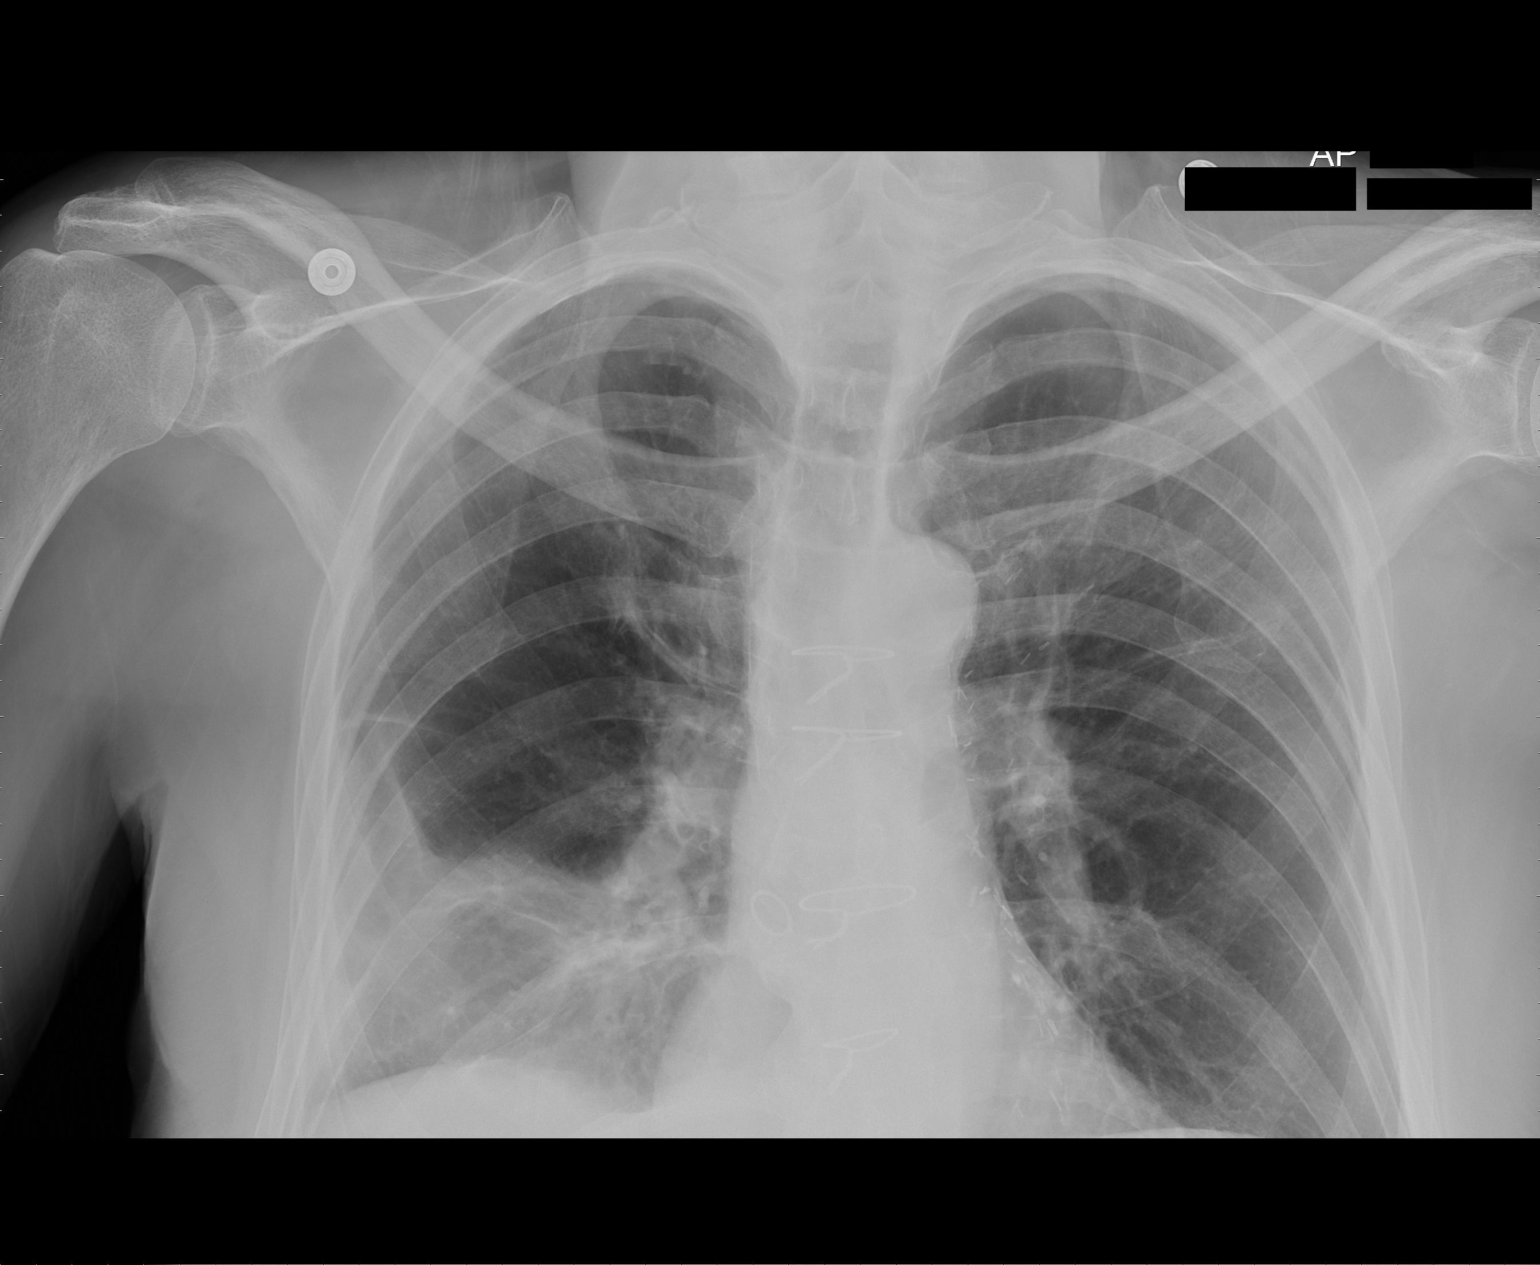

[1 of 1 positions shown; findings below may reference images not displayed]

FINDINGS: Small caliber right pleural drain has been removed in the
interval.  The loculated right pleural effusion appears unchanged.
No pneumothorax is demonstrated.  There is stable mild right
basilar atelectasis.  The left lung is clear.  Heart size and
mediastinal contours are stable.
IMPRESSION: No pneumothorax following right pleural drainage catheter removal.
The loculated right pleural effusion and right basilar atelectasis
appear unchanged.

## 2012-09-08 IMAGING — CR DG CHEST 2V
2 series · 2 of 2 positions shown · non-contrast
Comparison: 07/14/2011 and 07/13/2011 radiographs.  CT 07/13/2011.

CLINICAL DATA: Chest tube removal.  Possible pneumonia.

CHEST - 2 VIEW

[w chest pa]
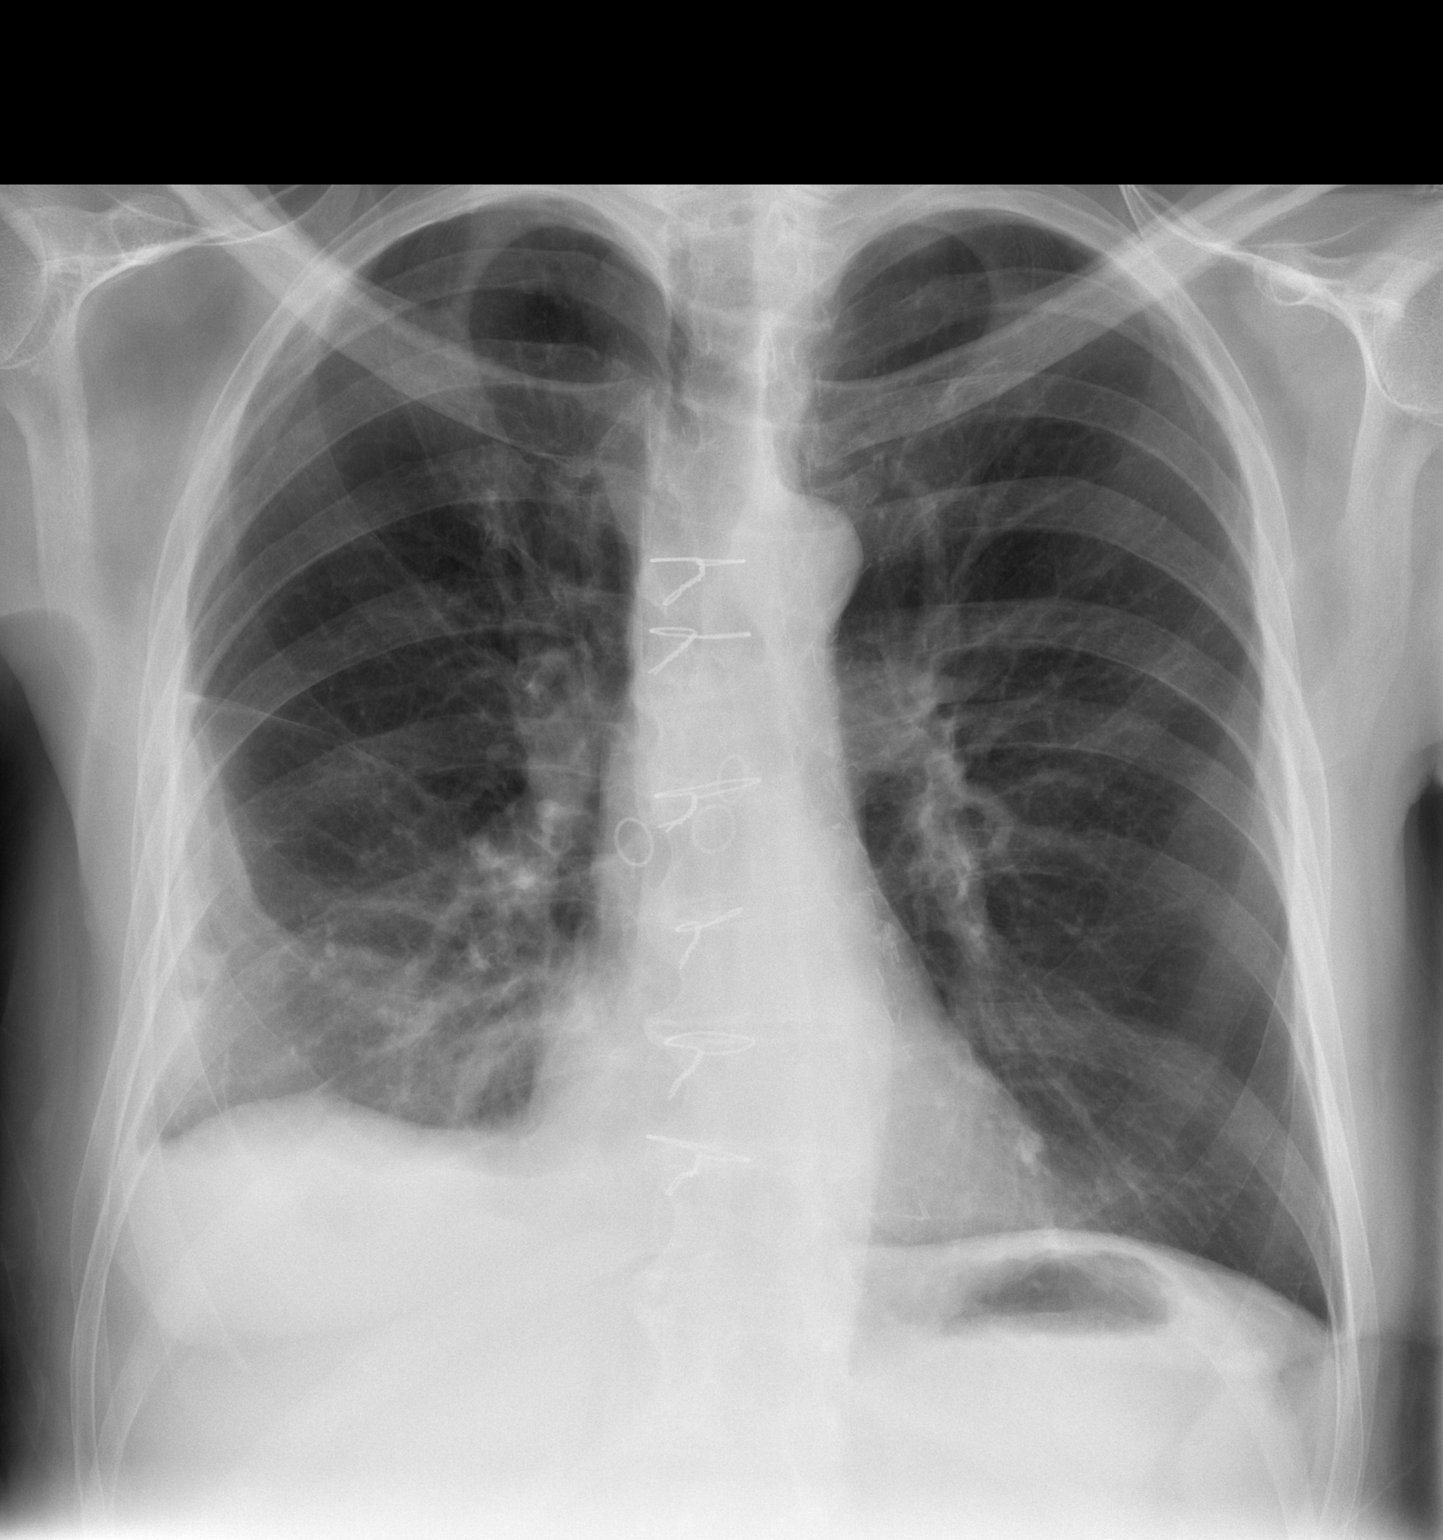

[w chest lat]
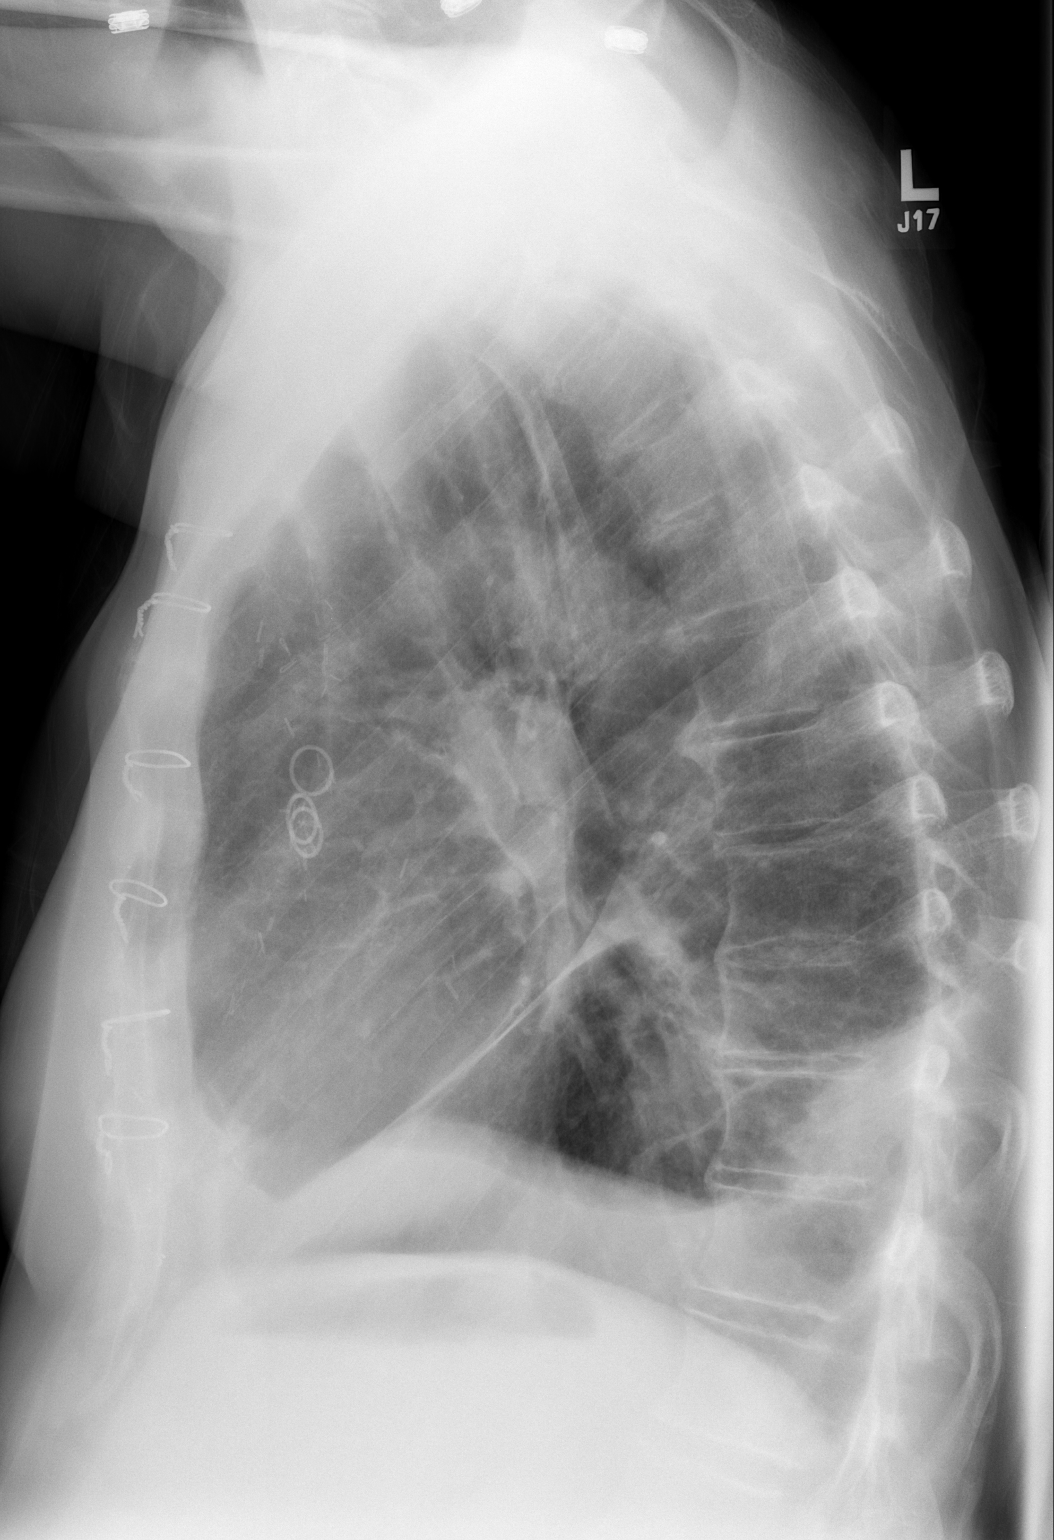

[2 of 2 positions shown; findings below may reference images not displayed]

FINDINGS: Right pleural drain was removed earlier today.  There is
no pneumothorax.  The loculated right pleural effusion and adjacent
right basilar pulmonary opacity are unchanged.  The overall lung
aeration has improved.  Heart size and mediastinal contours are
stable.
IMPRESSION: Improved inspiration.  Otherwise stable examination following right
pleural drain removal.  No evidence of pneumothorax.

## 2012-09-11 ENCOUNTER — Telehealth: Payer: Self-pay | Admitting: Oncology

## 2012-09-11 NOTE — Telephone Encounter (Signed)
s.w. pt and advised that i scheduled appt with Dr. Cruzita Lederer for 12.31.13 @ 9:00am..scheduled with Colletta [place]

## 2012-09-14 ENCOUNTER — Ambulatory Visit: Payer: Medicare Other | Admitting: Physical Therapy

## 2012-09-19 ENCOUNTER — Ambulatory Visit: Payer: Medicare Other | Admitting: Physical Therapy

## 2012-09-19 ENCOUNTER — Ambulatory Visit (INDEPENDENT_AMBULATORY_CARE_PROVIDER_SITE_OTHER): Payer: Medicare Other | Admitting: Internal Medicine

## 2012-09-19 VITALS — BP 122/68 | HR 96 | Wt 192.0 lb

## 2012-09-19 DIAGNOSIS — E349 Endocrine disorder, unspecified: Secondary | ICD-10-CM

## 2012-09-19 DIAGNOSIS — T148XXA Other injury of unspecified body region, initial encounter: Secondary | ICD-10-CM

## 2012-09-19 DIAGNOSIS — N62 Hypertrophy of breast: Secondary | ICD-10-CM

## 2012-09-19 NOTE — Patient Instructions (Signed)
It has been nice to see you in our clinic! I will let you know the results of your labs as soon as they become available. Please call with any questions: [phone]

## 2012-09-19 NOTE — Progress Notes (Addendum)
Subjective:     Patient ID: Brandon Ray, male   DOB: Feb 06, 1935, 77 y.o.   MRN: [id number]  HPI Brandon Ray is a very pleasant 77 y/o man with h/o severe polyneuropathy and monoclonal gammopathy, referred by his oncologist, Dr. Gaylyn Rong, for evaluation of endocrine features of POEMS sd. and gynecomastia.  History was obtained from pt and his wife and I also reviewed his chart including office notes, telephone notes, labs, and available scanned records.  Pt. was dx with polyneuropathy last year. However, he was an avid hiker and noticed numbness in left foot and difficulty walking for ~10 years. Last year, he was admitted and had surgery for bowel obstruction, complicated with abd. abscess. He was discharged with PT and it was noticed during PT sessions that he has a foot drop. He had EMG/NCT and was found to have peripheral neuropathy with mixed demyelinating and axonal features. He also had a spine MRI showing DDD at L4-L5 w/o significant foraminal stenosis and a healed midsacral fracture (pt's wife remembers that he fell in the house 2-3 years ago and that might have caused the fx.). Pt developed generalized weakness and lost 15 lbs after this episode, now gained some back.  Labs checked during the investigation for his neuropathy were normal per neurology note - he sees Dr. Terrace Arabia: TSH, vitamins B6 and 12, CBC (exc. Mild low WBC at 3.3), ACE, ESR, CPK, CRP, CMP.   He received a dx of CIDP, and received IVIG x 3 (in 12/2011), but developed a rash. He was also evaluated at [facility] and had a new NCT, showing axonal neuropathy with distal denervation. Per notes, the dx of CIDP is not definitive, he has rather rather atypical features.   In 05/2012, he received a flu shot and a DTaP and developed acute worsening of the CIDP, with right arm weakness and lack of sensation, then progressed to bilateral LE weakness that progressed to trunk, neurogenic bladder, constipation. He still has constipation as  he "does not feel much there". He was given plasmapheresis x 5 days. This did not help a lot and he was started on a Prednisone taper: 60 mg x 2 mo, then 40 mg x 2 weeks, then 20 mg x 1 week, and will now continue with 10 mg x 1 week and then stop. He has a moderate response to prednisone.   Monoclonal gammopathy: pt 's serum was negative for M-protein spike, however, his urine is positive for lambda-containing monoclonal gammopathy (Bence-Jones protein). He does not have anemia or CKD, so multiple myeloma was considered less likely. BM Bx showed 4% plasma cells = plasma cell dyscrasia. He also has a slightly hypercellular BM for age. Of note, [place] stain was negative for amyloidosis. He had a bone scan (at [facility], 07/13/2012) which was reportedly unremarkable.  FISH analysis done at [facility]: 54 XY (# cells karyotyped 6).  Other labs/studies obtained this year: Gangliosides negative (this is a new result, pt brings it with him), CSF: WBC 6, RBC 291, total CSF protein 54 - higher than nl, glu 64, myelin basic protein (MBP) 8.9 (<5.5) - higher than normal (myelin breakdown) negative oligoclonal bands. He also had a PTH of 35 (no calcium reported with it), a neg. Paraneoplastic panel and a neg. myelin-associated glycoprotein (MAG). TSH (05/2012) was normal at 2.9. Last CBC normal (08/15/12) in absolute total counts, but 87%/8.8 neutrophiles (increased), lymphocytes 10% (decreased). VEGF level pending (sent by Dr. Gaylyn Rong).  In the last few months, a  dx of POEMS (polyneuropathy, organomegaly, endocrinopathy, monoclonal gammopathy, skin changes) was entertained and he was referred to me for further endocrine workup. He also has gynecomastia and is interested in etiology.   A recent U/S abdomen (08/23/12) showed no organomegaly. He denies skin changes, but his wife noticed he is maybe more flushed. He noted more greasiness on hands, but no rashes, hypertrichosis, darkening of skin.   Gynecomastia: He noticed  his breasts starting to enlarge slowly in last 2-3 years, but more in last 2-3 months. No hot flushes. Not feeling cold. No testicular masses, but testicular size has drastically decreased over years and he does not have libido or erections at all.   His wife did notice a upper back hump. No other falls than the one mentioned above. He did not have  A DEXA scan yet. He is on Ca+vit D 500-200/day + vit D 2000 IU daily + vit D and Ca from a MVI daily. He is on phenobarbital and phenytoin for seizures, now reduced in dose since he did not have a seizure since 1961.   Review of Systems Constitutional: no weight gain/loss, no fatigue, no subjective hyperthermia/hypothermia Eyes: + recent  blurry vision >> will have cataract surgery, no xerophthalmia ENT: no sore throat, no nodules palpated in throat, no dysphagia/odynophagia, no hoarseness Cardiovascular: no CP/SOB/palpitations/leg swelling Respiratory: no cough/SOB Gastrointestinal: no N/V/D/ but has constipation - Miralax Musculoskeletal: no muscle/joint aches. Milk kyphosis, no tenderness at percussion of spine. Skin: no rashes Neurological: no tremors but has numbness/tingling in lower extremities, lower trunk and he c/o having a band across his chest (circumferential, with increased sensitivity, prickly sensation, feels like it weeping, but no skin lesions, going across his nipples - T5 dermatome). No increased nipple sensitivity. This is bothering him a lot. He also has increased sensitivity in the upper medio-cubital fossa bilaterally - in the T1-T2? Dermatome). Denies dizziness. Psychiatric: no depression/anxiety  Past Medical History  Diagnosis Date  . Coronary artery disease 01/2003    CABG  . Hyperlipidemia   . Seizure disorder 1960  . Weakness   . Pleural effusion 05/2011    from abdominal surgery  . Small bowel obstruction 05/2011    due to adhesion  . Peripheral neuropathy 05/2011    CIDP.  follow up with Dr. Krista Blue.   . Free  monoclonal light chain     urine   Past Surgical History  Procedure Date  . Cardiac catheterization 01/27/2003    NORMAL LEFT VENTRICULAR SIZE WITH MILD FOCAL LATERAL WALL HYPOKINESIA. EF 60%  . Coronary artery bypass graft 2004    LIMA GRAFT TO THE LAD, SAPHENOUS VEIN GRAFT TO THE DIAGONAL, SAPHENOUS VEIN GRAFT TO THE FIRST OM, SAPHENOUS VEIN GRAFT TO THE PDA  . Transurethral resection of prostate   . Hand surgery 2000    left  . Abdominal surgery 05/2011    bowel obstruction  . Ankle fracture surgery 1997    left   History   Social History  . Marital Status: Married    Spouse Name: N/A    Number of Children: 4  . Years of Education: N/A   Occupational History  . manager     retired, Personnel officer.    Social History Main Topics  . Smoking status: Former Smoker -- 1.0 packs/day for 5 years    Types: Cigarettes    Quit date: 07/01/1965  . Smokeless tobacco: Never Used  . Alcohol Use: Rarely, mostly holidays  . Drug Use: No  .  Sexually Active: No   Current Outpatient Prescriptions on File Prior to Visit  Medication Sig Dispense Refill  . aspirin 81 MG tablet Take 81 mg by mouth daily.       Marland Kitchen atorvastatin (LIPITOR) 20 MG tablet Take 20 mg by mouth daily.      . calcium-vitamin D (OSCAL WITH D) 500-200 MG-UNIT per tablet Take 1 tablet by mouth daily.        . Cholecalciferol (VITAMIN D) 2000 UNITS tablet Take 2,000 Units by mouth daily.        . Coenzyme Q10 (CO Q-10 PO) Take 1 tablet by mouth daily.      . folic acid (FOLVITE) 789 MCG tablet Take 800 mcg by mouth daily.       . furosemide (LASIX) 20 MG tablet       . Multiple Vitamin (MULTIVITAMIN) tablet Take 1 tablet by mouth daily.        . Multiple Vitamins-Minerals (PRESERVISION AREDS PO) Take 1 tablet by mouth 2 (two) times daily.       . Omega-3 Fatty Acids (FISH OIL) 1200 MG CAPS Take 1,200 mg by mouth daily.      Marland Kitchen PHENobarbital (LUMINAL) 97.2 MG tablet Take 97.2 mg by mouth daily.        . phenytoin  (DILANTIN) 100 MG ER capsule Take 100 mg by mouth daily.       . polyethylene glycol (MIRALAX / GLYCOLAX) packet Take 17 g by mouth daily.      . predniSONE (DELTASONE) 20 MG tablet Take 20 mg by mouth. 3 tablets daily       Allergies  Allergen Reactions  . Boostrix (Tetanus-Diphth-Acell Pertussis)   . Other     Flu shot  . Amoxicillin Rash    Upper torso only.   Family History  Problem Relation Age of Onset  . Asthma Mother   . Heart disease Mother 56  . Coronary artery disease Father   . Heart disease Father   . Cancer Sister 35    breast  Daughter has a central demyelinating ds (?) and has seizures.  Objective:   Physical Exam BP 122/68  Pulse 96  Wt 192 lb (87.091 kg)  SpO2 97% Constitutional: overweight, in NAD, difficult to move to examiner's table or to stand - needs to lean on wall Eyes: PERRLA, EOMI, no exophthalmos ENT: moist mucous membranes, no thyromegaly, no cervical lymphadenopathy Cardiovascular: RRR, No MRG Respiratory: CTA B Gastrointestinal: abdomen soft, NT, ND, BS+ Musculoskeletal: no deformities, strength intact in all 4 Skin: moist, warm, no rashes Neurological: no tremor with outstretched hands, no DTRs detected Breast exam: pendulous bilateral breasts (equal in size), but I cannot palpate the mammary gland behind nipples. No masses, no axillary LAD Genital exam: male-type escutcheon, no inguinal LAD, reduced size phallus, testes ~5 mL L>R, no testicular masses, no penile discharge  Assessment:     1. ?POEMS (polyneuropathy, organomegaly, endocrinopathy, monoclonal gammopathy, skin changes)  - + polyneuropathy - no organomegaly - likely + endocrinopathy (hypogonadism) - + monoclonal gammopathy - no skin changes (? If flushing qualifies)  2. Gynecomastia - unclear if real GM or breast enlargement 2/2 recent weight gain - + testicular atrophy  3. Osteoporosis - new Dx - h/o sacral fracture - on steroid taper now - anticipated d/c in 2  weeks - no DEXA available - on adequate Ca+vit D, no vit D level available  4. Constipation    Plan:     1. ?POEMS -  very rare sd., I printed and gave him the Up to Date chapter on the subject and we studied the diagnosis criteria table together.  Mandatory criteria (need both)  - Peripheral neuropathy - yes  - Monoclonal plasma cell disorder - yes Major criteria  (need one)  - Osteosclerotic bone lesions - no  - Elevated VEGF levels - pending, but can be decreased by steroids; has macular degeneration and gets intraocular Avastin tx (anti-VEGF Ab)  - Castleman's disease - no  Minor criteria  (need one)  - Skin changes - flushing ?  - Organomegaly - no  - Extravascular volume overload - no, denies edema/ascites not seen on imaging tests  - Hematologic features - yes, plasma cell dyscrasia in BM, also hypercellular BM can be a feature   - CNS involvement/papilledema - no, he had recent eye exam  - Endocrine abnormalities - based on the fact that he had drastic testicular shrinkage (h/o normal erections and testicular size, h/o normal fertility - has 4 children, r/o Klinefelter sd by karyotype analysis - see above), he most likely has hypogonadism, which is the most common endocrinopathy associated with POEMS sd. We will check: . Prolactin  . Testosterone, free, total  . Estradiol  . Luteinizing hormone  . Follicle stimulating hormone  . TSH  . T4, free  . B-HCG Quant  We cannot evaluate the adrenal axis while he is on Prednisone but I have no reason to suspect adrenal insufficiency in him. Also, no reason to suspect acromegaly. PTH and calcium were normal in the past, will not recheck.  [Of note, pt has a high CSF protein level (>50 mg/dL) that has been associated with POEMS, but is not listed in the criteria list. On the other hand, he does not have osteosclerotic bone lesions, although I am not sure what type of scan he had and did not see the scan images, which is recognized to  appear in almost all pts with this sd.].  Overall, I believe is still very possible that he has POEMS sd.   2. Gynecomastia - he has symmetrically enlarged breasts, but very soft, I cannot feel a definite mammary gland behind the nipple. The breasts can be enlarged 2/2 his recent weight gain, but, especially with his testicular shrinkage, hypogonadism can definitely play a role (estrogen level will predominate against testosterone level). I ordered a mammogram to clarify if, indeed, there is enlargement of the gland.  3. H/o sacral fracture - this wins him an osteoporosis diagnosis He is on Prednisone now and I explained that the steroids increase the risk of fx even without changes in BMD on DEXA. He is on a good regimen of Ca + vit D. I will try to add a vit D level to his set of labs, though as I cannot see one in Epic. I will also order a DEXA and also a spine Xray to check for vertebral fx since he has a degree of kyphosis and he has T5 dermatome hyperesthesia. We did not have enough time to address his osteoporosis at this visit, but I will see him back in 3 months, by then he will hopefully be off steroids.  4. Constipation - advised to add magnesium oxide 400 or 500 mg daily.  Time spent with pt: 2h, of which >50% was spent obtaining history from pt and wife, reviewing his complex investigations' results with the pt and wife, and explaining features of POEMS sd,  Gynecomastia, osteoporosis, and formulating a plan of  investigation as described above.   *RADIOLOGY REPORT*  Clinical Data: Symmetrical bilateral breast enlargement with no  palpable mass. There is a clinical concern that this is due to  gynecomastia as a result of hypogonadism.   DIGITAL DIAGNOSTIC BILATERAL MAMMOGRAM WITH CAD  Comparison: None.   Findings:  ACR Breast Density Category 2: There is a scattered fibroglandular  pattern.  Scattered fibroglandular tissue is slightly greater on the left  than the right. There  are also a small number of benign appearing  right breast calcifications. There are no findings suspicious for  malignancy in either breast.  Mammographic images were processed with CAD.   IMPRESSION:  Bilateral gynecomastia. No evidence of malignancy.   RECOMMENDATION:  Clinical follow-up.   Component     Latest Ref Rng 09/28/2012  Testosterone     300 - 890 ng/dL 18.63 (L)  Sex Hormone Binding     13 - 71 nmol/L 66  Testosterone Free     47.0 - 244.0 pg/mL 2.1 (L)  Testosterone-% Freee.     1.6 - 2.9 % 1.1 (L)  Estradiol, Free      0.32  Estradiol      17  Prolactin     2.1 - 17.1 ng/mL 25.1 (H)  LH     3.10 - 34.60 mIU/mL 21.24  FSH     1.4 - 18.1 mIU/ML 48.9 (H)  hCG, Beta Chain, Quant, S      2.05  This is a confusing endocrine picture. Very low testosterone, with slightly high PRL and high FSH & LH - (primary hypogonadism? - of note Klinefelter's sd. r/o by the fact that he had normal fertility in the past and his karyotype was normal). If the high PRL would have been the cause for his low T, then LH would have been low. If a LH- or Taylor- producing pituitary tumor, these are usually silent, or else T is high.Marland KitchenMarland KitchenPhenytoin tx can increase PRL and also FSH and LH. Also, per Up to Date:  "[In POEMS] Hypogonadism is the most common endocrine abnormality. Elevated levels of follicle stimulating hormone [facility]) in the absence of primary hypogonadism levels have been reported. In a [facility] study of 170 patients with POEMS syndrome, over 41 percent of males tested were found to have hypogonadism. In this study, 10 (seven men and three women) of 35 patients tested had hyperprolactinemia, and 10 of 60 men had gynecomastia."  BetaHCG is low, which indicates no testicular germ cell tumor. Estradiol normal, but high E/T ratio, which is likely the cause for his gynecomastia.  Since the labs above are not c/w a pituitary tumor, I will not recommend a pituitary MRI for now, but would like to  follow the pt and might decide for this in the future. Of note, in his brain MRI from 05/2012, the pituitary gland appeared normal, and, while this test is not ideal in evaluating the pituitary, it confirms that there are no large pituitary abnormalities.  We will need to start Testosterone tx (gel), depending on what formulations are approved by his insurance.   Component     Latest Ref Rng 09/28/2012  Vit D, 25-Hydroxy     30 - 89 ng/mL 55  TSH     0.35 - 5.50 uIU/mL 1.99  Free T4     0.60 - 1.60 ng/dL 0.83  Hemoglobin A1C     4.6 - 6.5 % 6.1  - I did discuss with the pt about his HbA1C and  I believe this is due to his recent Prednisone tx. Will continue to follow it as he comes off Prednisone.  *RADIOLOGY REPORT*  Clinical Data: Mid thoracic spine pain   THORACIC SPINE - 2 VIEW + SWIMMERS  Comparison: Chest x-ray of 08/23/2011  Findings: There is mild degenerative change in the mid to lower  thoracic spine. No acute compression fracture is seen. No  paravertebral soft tissue swelling is noted. Median sternotomy  sutures are present from prior CABG.   IMPRESSION:  Mild degenerative spurring in the mid to lower thoracic spine. No  acute abnormality.   DUAL X-RAY ABSORPTIOMETRY (DXA) FOR BONE MINERAL DENSITY   AP LUMBAR SPINE  Bone Mineral Density (BMD): 0.997 g/cm2  Young Adult T Score: -0.9   DUAL FEMUR  Bone Mineral Density (BMD): 0.780 g/cm2  Young Adult T Score: -1.1   ASSESSMENT: Patient's diagnostic category is LOW BONE MASS (OSTEOPENIA) by WHO Criteria.  Based on the [place] model, the 10 year  probability of a major osteoporotic fracture is 13%. The 10 year  probability of a hip fracture is 3.5%.   - Will continue with adequate Ca + vit D intake, will hold off anti-resorptive agents for now. Will repeat DEXA in a year, off steroids.  - Pt informed about the above test results and the plan. He will let me know what Testosterone gel  formulation to send to his pharmacy. I explained that, for his gynecomastia, we can wait to see if the testosterone tx reduces his discomfort or refer to surgery. He told me he would like to wait until testosterone level physiological. He will also send me his last PSA result (he had it done by [facility], and not in [place]) and his colonoscopy report - to see if prostate enlarged. If no recent PSA< will need to check before starting Testosterone. Ideally, he should also have a rectal exam, but this can be done at next visit with PCP. He will call and make an appt with me in 6 mo.

## 2012-09-20 ENCOUNTER — Encounter: Payer: Self-pay | Admitting: Internal Medicine

## 2012-09-20 DIAGNOSIS — E349 Endocrine disorder, unspecified: Secondary | ICD-10-CM | POA: Insufficient documentation

## 2012-09-20 DIAGNOSIS — N62 Hypertrophy of breast: Secondary | ICD-10-CM | POA: Insufficient documentation

## 2012-09-20 DIAGNOSIS — T148XXA Other injury of unspecified body region, initial encounter: Secondary | ICD-10-CM | POA: Insufficient documentation

## 2012-09-20 DIAGNOSIS — N400 Enlarged prostate without lower urinary tract symptoms: Secondary | ICD-10-CM

## 2012-09-20 HISTORY — DX: Benign prostatic hyperplasia without lower urinary tract symptoms: N40.0

## 2012-09-21 ENCOUNTER — Other Ambulatory Visit: Payer: Self-pay | Admitting: Internal Medicine

## 2012-09-21 ENCOUNTER — Telehealth: Payer: Self-pay | Admitting: Internal Medicine

## 2012-09-21 ENCOUNTER — Ambulatory Visit (HOSPITAL_BASED_OUTPATIENT_CLINIC_OR_DEPARTMENT_OTHER): Payer: Medicare Other

## 2012-09-21 DIAGNOSIS — R803 Bence Jones proteinuria: Secondary | ICD-10-CM

## 2012-09-21 DIAGNOSIS — N62 Hypertrophy of breast: Secondary | ICD-10-CM

## 2012-09-21 DIAGNOSIS — R198 Other specified symptoms and signs involving the digestive system and abdomen: Secondary | ICD-10-CM

## 2012-09-21 DIAGNOSIS — D472 Monoclonal gammopathy: Secondary | ICD-10-CM

## 2012-09-21 DIAGNOSIS — E8809 Other disorders of plasma-protein metabolism, not elsewhere classified: Secondary | ICD-10-CM

## 2012-09-21 DIAGNOSIS — R809 Proteinuria, unspecified: Secondary | ICD-10-CM

## 2012-09-21 LAB — COMPREHENSIVE METABOLIC PANEL (CC13)
ALT: 35 U/L (ref 0–55)
Albumin: 3.6 g/dL (ref 3.5–5.0)
CO2: 28 mEq/L (ref 22–29)
Calcium: 9.2 mg/dL (ref 8.4–10.4)
Chloride: 103 mEq/L (ref 98–107)
Potassium: 4.1 mEq/L (ref 3.5–5.1)
Sodium: 141 mEq/L (ref 136–145)
Total Protein: 6.3 g/dL — ABNORMAL LOW (ref 6.4–8.3)

## 2012-09-21 LAB — CBC WITH DIFFERENTIAL/PLATELET
Basophils Absolute: 0 10*3/uL (ref 0.0–0.1)
HCT: 43.8 % (ref 38.4–49.9)
HGB: 14.9 g/dL (ref 13.0–17.1)
MONO#: 0.5 10*3/uL (ref 0.1–0.9)
NEUT%: 67.3 % (ref 39.0–75.0)
WBC: 6.9 10*3/uL (ref 4.0–10.3)
lymph#: 1.6 10*3/uL (ref 0.9–3.3)

## 2012-09-21 NOTE — Telephone Encounter (Signed)
Davy Pique, I printed the labs, can you please fax them to [facility]? Thank you.

## 2012-09-21 NOTE — Telephone Encounter (Signed)
The patient stopped by to state that he did not stop at the lab downstairs on 09/19/12 and he just had blood drawn at [facility] and wants to know if Dr. Cruzita Lederer can send the orders from the lab downstairs to the lab at [facility].  Please call the patient at [phone] with any questions.

## 2012-09-22 ENCOUNTER — Ambulatory Visit: Payer: Medicare Other | Attending: Physical Medicine & Rehabilitation | Admitting: Physical Therapy

## 2012-09-22 DIAGNOSIS — M6281 Muscle weakness (generalized): Secondary | ICD-10-CM | POA: Insufficient documentation

## 2012-09-22 DIAGNOSIS — IMO0001 Reserved for inherently not codable concepts without codable children: Secondary | ICD-10-CM | POA: Insufficient documentation

## 2012-09-22 DIAGNOSIS — R262 Difficulty in walking, not elsewhere classified: Secondary | ICD-10-CM | POA: Insufficient documentation

## 2012-09-22 DIAGNOSIS — R5381 Other malaise: Secondary | ICD-10-CM | POA: Insufficient documentation

## 2012-09-25 ENCOUNTER — Ambulatory Visit: Payer: Medicare Other | Admitting: Physical Therapy

## 2012-09-27 ENCOUNTER — Telehealth: Payer: Self-pay | Admitting: *Deleted

## 2012-09-27 ENCOUNTER — Other Ambulatory Visit: Payer: Self-pay | Admitting: *Deleted

## 2012-09-27 ENCOUNTER — Ambulatory Visit: Payer: Medicare Other | Admitting: Physical Therapy

## 2012-09-27 ENCOUNTER — Other Ambulatory Visit: Payer: Self-pay | Admitting: Neurology

## 2012-09-27 DIAGNOSIS — N62 Hypertrophy of breast: Secondary | ICD-10-CM

## 2012-09-27 NOTE — Addendum Note (Signed)
Addended by: Ellene Route on: 09/27/2012 03:46 PM   Modules accepted: Orders

## 2012-09-27 NOTE — Telephone Encounter (Signed)
PATIENT AWARE OF NEED TO GO TO [facility] IN THE AM FOR LAB WORK TO BE DONE INSTEAD OF [facility]. PATIENT AWARE OF OTHER TEST ORDER PER DR. GHERGHE TO BE DONE TOMORROW AT [facility].

## 2012-09-27 NOTE — Addendum Note (Signed)
Addended by: Ellene Route on: 09/27/2012 03:40 PM   Modules accepted: Orders

## 2012-09-27 NOTE — Patient Instructions (Addendum)
1.  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    *  Less like likely to be POEMS syndrome due to lack of typical nerve conduction findings normally seen in POEMS. There was polyneuropathy; however, per Dr. Trinna Post, this could be have chronic.  There was no organomegaly (enlarged liver or spleen on ultrasound).  You are being evaluated to see if there is any endocrine abnormality.  Your monoclonal spike is not very elevated either in blood or urine.  There is no obvious skin abnormality.  *  There is no evidence of active multiple myeloma:  No anemia, renal problem, skeletal finding, or high calcium.  There are required in patients with active myeloma. Your bone marrow biopsy showed <10% plasma cells.   2.  Prognosis:  1% of patients with MGUS per year develop active myeloma.   3.  Recommendation:  Observation. No chemo therapy is indicated.  Will follow blood test (SPEP (M-spike) and free light chain), blood count, and kidney function.  If there is significant change, will repeat evaluation.

## 2012-09-28 ENCOUNTER — Ambulatory Visit
Admission: RE | Admit: 2012-09-28 | Discharge: 2012-09-28 | Disposition: A | Discharge status: Home / Self Care | Payer: Medicare Other | Source: Ambulatory Visit | Attending: Internal Medicine | Admitting: Internal Medicine

## 2012-09-28 ENCOUNTER — Ambulatory Visit (HOSPITAL_BASED_OUTPATIENT_CLINIC_OR_DEPARTMENT_OTHER): Payer: Medicare Other | Admitting: Oncology

## 2012-09-28 ENCOUNTER — Other Ambulatory Visit: Payer: Self-pay | Admitting: Internal Medicine

## 2012-09-28 ENCOUNTER — Other Ambulatory Visit: Payer: Medicare Other

## 2012-09-28 ENCOUNTER — Encounter: Payer: Self-pay | Admitting: Internal Medicine

## 2012-09-28 ENCOUNTER — Telehealth: Payer: Self-pay | Admitting: Oncology

## 2012-09-28 ENCOUNTER — Other Ambulatory Visit (INDEPENDENT_AMBULATORY_CARE_PROVIDER_SITE_OTHER): Payer: Medicare Other

## 2012-09-28 VITALS — BP 152/76 | HR 78 | Temp 96.7°F | Resp 18 | Ht 73.0 in | Wt 197.3 lb

## 2012-09-28 DIAGNOSIS — T148XXA Other injury of unspecified body region, initial encounter: Secondary | ICD-10-CM

## 2012-09-28 DIAGNOSIS — D472 Monoclonal gammopathy: Secondary | ICD-10-CM

## 2012-09-28 DIAGNOSIS — G6181 Chronic inflammatory demyelinating polyneuritis: Secondary | ICD-10-CM

## 2012-09-28 DIAGNOSIS — N62 Hypertrophy of breast: Secondary | ICD-10-CM

## 2012-09-28 LAB — TSH: TSH: 1.99 u[IU]/mL (ref 0.35–5.50)

## 2012-09-28 LAB — T4, FREE: Free T4: 0.83 ng/dL (ref 0.60–1.60)

## 2012-09-28 LAB — HCG, QUANTITATIVE, PREGNANCY: hCG, Beta Chain, Quant, S: 2.05 m[IU]/mL

## 2012-09-28 NOTE — Progress Notes (Signed)
[place]  [phone] [phone]   OFFICE PROGRESS NOTE   Cc:  [name], MD  DIAGNOSIS:  MGUS.   CURRENT THERAPY: observation for MGUS.   INTERVAL HISTORY: Brandon Ray 78 y.o. male returns to clinic with his wife and daughter to go over result of work up.  He still has residual abnormal sensation along his waist.  He has slight weakness in bilateral lower extremities and gait abnormality.  He has numbness in his right hand but no problem with either proximal or distal weakness.  He is undergoing PT and OT.  He is slowly tapering down his Prednisone.  He denies fever, mucositis, bone pain, recurrent infection, bleeding symptoms, skin rash, anorexia, weight loss, early satiety, back pain.  He still has gynecomastia.  The rest of the 14-point review of system was negative.      Past Medical History  Diagnosis Date  . Coronary artery disease 01/2003    CABG  . Hyperlipidemia   . Seizure disorder 1960  . Weakness   . Pleural effusion 05/2011    from abdominal surgery  . Small bowel obstruction 05/2011    due to adhesion  . Peripheral neuropathy 05/2011    CIDP.  follow up with Dr. Krista Blue.   . Free monoclonal light chain     urine    Past Surgical History  Procedure Date  . Cardiac catheterization 01/27/2003    NORMAL LEFT VENTRICULAR SIZE WITH MILD FOCAL LATERAL WALL HYPOKINESIA. EF 60%  . Coronary artery bypass graft 2004    LIMA GRAFT TO THE LAD, SAPHENOUS VEIN GRAFT TO THE DIAGONAL, SAPHENOUS VEIN GRAFT TO THE FIRST OM, SAPHENOUS VEIN GRAFT TO THE PDA  . Transurethral resection of prostate   . Hand surgery 2000    left  . Abdominal surgery 05/2011    bowel obstruction  . Ankle fracture surgery 1997    left    Current Outpatient Prescriptions  Medication Sig Dispense Refill  . aspirin 81 MG tablet Take 81 mg by mouth daily.       Marland Kitchen atorvastatin (LIPITOR) 20 MG tablet Take 20 mg by mouth daily.      . calcium-vitamin D  (OSCAL WITH D) 500-200 MG-UNIT per tablet Take 1 tablet by mouth daily.        . Cholecalciferol (VITAMIN D) 2000 UNITS tablet Take 2,000 Units by mouth daily.        . Coenzyme Q10 (CO Q-10 PO) Take 1 tablet by mouth daily.      . folic acid (FOLVITE) 267 MCG tablet Take 800 mcg by mouth daily.       . furosemide (LASIX) 20 MG tablet       . Multiple Vitamin (MULTIVITAMIN) tablet Take 1 tablet by mouth daily.        . Multiple Vitamins-Minerals (PRESERVISION AREDS PO) Take 1 tablet by mouth 2 (two) times daily.       . Omega-3 Fatty Acids (FISH OIL) 1200 MG CAPS Take 1,200 mg by mouth daily.      Marland Kitchen PHENobarbital (LUMINAL) 97.2 MG tablet Take 97.2 mg by mouth daily.        . phenytoin (DILANTIN) 100 MG ER capsule Take 100 mg by mouth daily.       . polyethylene glycol (MIRALAX / GLYCOLAX) packet Take 17 g by mouth daily.      . predniSONE (DELTASONE) 20 MG tablet Take 10 mg by mouth. 3 tablets daily  ALLERGIES:  is allergic to boostrix; other; and amoxicillin.  REVIEW OF SYSTEMS:  The rest of the 14-point review of system was negative.   Filed Vitals:   09/28/12 0841  BP: 152/76  Pulse: 78  Temp: 96.7 F (35.9 C)  Resp: 18   Wt Readings from Last 3 Encounters:  09/28/12 197 lb 4.8 oz (89.495 kg)  09/19/12 192 lb (87.091 kg)  08/30/12 193 lb (87.544 kg)   ECOG Performance status: 2 due to neuropathy and weakness.    PHYSICAL EXAMINATION:   General: well-nourished man, in no acute distress. Eyes: no scleral icterus. ENT: There were no oropharyngeal lesions. Neck was without thyromegaly. Lymphatics: Negative cervical, supraclavicular or axillary adenopathy. Respiratory: lungs were clear bilaterally without wheezing or crackles. Cardiovascular: Regular rate and rhythm, S1/S2, without murmur, rub or gallop. There was trace bilateral pedal edema. GI: abdomen was soft, flat, nontender, nondistended, without organomegaly. He did not appear to have buffalo hump; however, his hip  appeared fuller compared to the rest of his body. Muscoloskeletal: no spinal tenderness of palpation of vertebral spine. Skin exam was without echymosis, petichae. Neuro exam was notable to CN II-XII grossly intact. His motor strength was 5/5 bilaterally in both upper and lower extremities; proximal and distal groups. Patient needed some assistance to get on and off exam table. Gait was slow and mearured. Patient was alerted and oriented. Attention was good. Language was appropriate. Mood was normal without depression. Speech was not pressured. Thought content was not tangential.     LABORATORY/RADIOLOGY DATA:  Lab Results  Component Value Date   WBC 6.9 09/21/2012   HGB 14.9 09/21/2012   HCT 43.8 09/21/2012   PLT 200 09/21/2012   GLUCOSE 109* 09/21/2012   ALKPHOS 75 09/21/2012   ALT 35 09/21/2012   AST 23 09/21/2012   NA 141 09/21/2012   K 4.1 09/21/2012   CL 103 09/21/2012   CREATININE 0.9 09/21/2012   BUN 22.0 09/21/2012   CO2 28 09/21/2012   INR 1.05 06/07/2012    ASSESSMENT AND PLAN:   1. Seizure: On phenytoin and phenobarbital.  2. Chronic inflammatory demyelinating polyneuropathy: S/p IVIg in 12/2011; s/p plasmaphoresis in 05/2012; on Prednisone taper daily currently.  3.  Gynecomastia:  I appreciate evaluation by Dr. Cruzita Lederer.   4.  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   - Less like likely to be POEMS syndrome due to lack of typical nerve conduction findings normally seen in POEMS. There was polyneuropathy; however, per Dr. Trinna Post, this could be have chronic.  There was no organomegaly (enlarged liver or spleen on ultrasound). He is being evaluated to see if there is any endocrine abnormality.  His monoclonal spike is not very elevated either in blood or urine.  There is no obvious skin abnormality.  He did not meet major criteria for POEMS syndrome at this time.  VEGF level is pending, but it's rather nonspecific.    -There is no evidence of active multiple myeloma:  No anemia, renal problem, skeletal finding, or high calcium.  There are required in patients with active myeloma. Your bone marrow biopsy showed <10% plasma cells.   - Prognosis of MGUS:  1% of patients with MGUS per year develop active myeloma.  - Recommendation:  Observation. No chemo therapy is indicated.  Will follow blood test (SPEP (M-spike) and free light chain), blood count, and kidney function.  If there is significant change, will repeat evaluation.  I offered 2nd opinion at [facility] or [facility] to see if sub-specialists in plasma cell disorder can weigh in.  He said that he will let me know if he decides to pursue 2nd opion.   5.  Follow up:  In about 4 months.    The length of time of the face-to-face encounter was 25 minutes. More than 50% of time was spent counseling and coordination of care.

## 2012-09-28 NOTE — Telephone Encounter (Signed)
Gave pt appt for May lab and MD

## 2012-09-29 ENCOUNTER — Telehealth: Payer: Self-pay | Admitting: *Deleted

## 2012-09-29 ENCOUNTER — Other Ambulatory Visit: Payer: Self-pay | Admitting: *Deleted

## 2012-09-29 LAB — KAPPA/LAMBDA LIGHT CHAINS: Kappa:Lambda Ratio: 0.1 — ABNORMAL LOW (ref 0.26–1.65)

## 2012-09-29 LAB — TESTOSTERONE, FREE, TOTAL, SHBG
Testosterone, Free: 2.1 pg/mL — ABNORMAL LOW (ref 47.0–244.0)
Testosterone-% Free: 1.1 % — ABNORMAL LOW (ref 1.6–2.9)
Testosterone: 18.63 ng/dL — ABNORMAL LOW (ref 300–890)

## 2012-09-29 NOTE — Telephone Encounter (Signed)
CALLED [facility], SPOKE TO CAROL., DEXA SCAN SCHEDULED FOR Thursday, January 16TH AT 8:30 AM. PATIENT IS AWARE OF THIS .

## 2012-10-02 ENCOUNTER — Ambulatory Visit: Payer: Medicare Other | Admitting: Physical Therapy

## 2012-10-03 LAB — VASCULAR ENDOTHELIAL GROWTH FACTOR

## 2012-10-04 ENCOUNTER — Ambulatory Visit: Payer: Medicare Other | Admitting: Physical Therapy

## 2012-10-04 ENCOUNTER — Encounter: Payer: Self-pay | Admitting: *Deleted

## 2012-10-04 ENCOUNTER — Encounter: Payer: Self-pay | Admitting: Internal Medicine

## 2012-10-05 ENCOUNTER — Ambulatory Visit
Admission: RE | Admit: 2012-10-05 | Discharge: 2012-10-05 | Disposition: A | Discharge status: Home / Self Care | Payer: Medicare Other | Source: Ambulatory Visit | Attending: Internal Medicine | Admitting: Internal Medicine

## 2012-10-08 LAB — ESTRADIOL, FREE
Estradiol, Free: 0.32 pg/mL
Estradiol: 17 pg/mL

## 2012-10-09 ENCOUNTER — Ambulatory Visit: Payer: Medicare Other | Admitting: Physical Therapy

## 2012-10-10 ENCOUNTER — Encounter: Payer: Self-pay | Admitting: Internal Medicine

## 2012-10-12 ENCOUNTER — Other Ambulatory Visit: Payer: Self-pay | Admitting: Internal Medicine

## 2012-10-12 ENCOUNTER — Telehealth: Payer: Self-pay | Admitting: *Deleted

## 2012-10-12 ENCOUNTER — Ambulatory Visit: Payer: Medicare Other | Admitting: Physical Therapy

## 2012-10-12 ENCOUNTER — Encounter: Payer: Self-pay | Admitting: Internal Medicine

## 2012-10-12 DIAGNOSIS — E291 Testicular hypofunction: Secondary | ICD-10-CM

## 2012-10-12 MED ORDER — TESTOSTERONE 50 MG/5GM (1%) TD GEL: TRANSDERMAL | Status: DC

## 2012-10-12 NOTE — Telephone Encounter (Signed)
Patient notified of Rx sent to [place].

## 2012-10-16 ENCOUNTER — Ambulatory Visit: Payer: Medicare Other | Admitting: Physical Therapy

## 2012-10-18 NOTE — Telephone Encounter (Signed)
DR. Cruzita Lederer. Marland Kitchen HAVE CONTACED PHARMACY TO FAX ME ANOTHER FORM AND I WILL CALL AND HOPEFULLY IF DIAGNOSIS OF ? WILL HELP GET APPROVED. WILL CALL WHEN GET FAX FROM [employer].  I WAS AFRAID THIS WAS GOING TO HAPPEN WHEN YOU ORDERED THIS BECAUSE IT DOES REQUIRE A PRIOR AUTHORIZATION.   SUE

## 2012-10-19 ENCOUNTER — Telehealth: Payer: Self-pay | Admitting: *Deleted

## 2012-10-19 ENCOUNTER — Ambulatory Visit: Payer: Medicare Other | Admitting: Physical Therapy

## 2012-10-19 ENCOUNTER — Other Ambulatory Visit: Payer: Self-pay | Admitting: Internal Medicine

## 2012-10-19 ENCOUNTER — Other Ambulatory Visit: Payer: Self-pay | Admitting: *Deleted

## 2012-10-19 DIAGNOSIS — E291 Testicular hypofunction: Secondary | ICD-10-CM | POA: Insufficient documentation

## 2012-10-19 NOTE — Telephone Encounter (Signed)
WAITING ON FAX FROM [employer] OF APPROVAL OR DENIAL OF MEDICATION TESTOSTERONE.

## 2012-10-19 NOTE — Telephone Encounter (Signed)
CALLED [employer] [place] FOR PRIOR AUTHORIZATION FOR TESTOSTERONE GEL. FAXING FORM TO OUR OFFICE TO FILL OUT FOR THIS MEDICATION.Brandon Ray

## 2012-10-20 ENCOUNTER — Telehealth: Payer: Self-pay | Admitting: *Deleted

## 2012-10-20 NOTE — Telephone Encounter (Signed)
REPRESENTATIVE CALLED TO APPROVE MEDICATION, TESTOSTERONE , FOR PATIENT FROM 10/20/2012 TO 10/20/2013. PATIENT NOTIFIED OF THIS AND TO CHECK WITH HIS PHARMACY TODAY TO START MEDICATION.

## 2012-10-23 ENCOUNTER — Ambulatory Visit: Payer: Medicare Other | Attending: Physical Medicine & Rehabilitation | Admitting: Physical Therapy

## 2012-10-23 DIAGNOSIS — M6281 Muscle weakness (generalized): Secondary | ICD-10-CM | POA: Insufficient documentation

## 2012-10-23 DIAGNOSIS — R262 Difficulty in walking, not elsewhere classified: Secondary | ICD-10-CM | POA: Insufficient documentation

## 2012-10-23 DIAGNOSIS — IMO0001 Reserved for inherently not codable concepts without codable children: Secondary | ICD-10-CM | POA: Insufficient documentation

## 2012-10-23 DIAGNOSIS — R5381 Other malaise: Secondary | ICD-10-CM | POA: Insufficient documentation

## 2012-10-25 ENCOUNTER — Ambulatory Visit: Payer: Medicare Other | Admitting: Physical Therapy

## 2012-10-27 ENCOUNTER — Ambulatory Visit: Payer: Medicare Other | Admitting: Physical Therapy

## 2012-10-30 ENCOUNTER — Ambulatory Visit: Payer: Medicare Other | Admitting: Physical Therapy

## 2012-11-01 ENCOUNTER — Ambulatory Visit: Payer: Medicare Other | Admitting: Physical Therapy

## 2012-11-02 ENCOUNTER — Ambulatory Visit: Payer: Medicare Other | Admitting: Physical Therapy

## 2012-11-04 ENCOUNTER — Other Ambulatory Visit: Payer: Self-pay

## 2012-11-06 ENCOUNTER — Ambulatory Visit: Payer: Medicare Other | Admitting: Physical Therapy

## 2012-11-08 ENCOUNTER — Ambulatory Visit: Payer: Medicare Other | Admitting: Physical Therapy

## 2012-11-09 ENCOUNTER — Encounter: Payer: Self-pay | Admitting: Internal Medicine

## 2012-11-13 ENCOUNTER — Ambulatory Visit: Payer: Medicare Other | Admitting: Physical Therapy

## 2012-11-16 ENCOUNTER — Ambulatory Visit: Payer: Medicare Other | Admitting: Physical Therapy

## 2012-11-21 ENCOUNTER — Ambulatory Visit: Payer: Medicare Other | Attending: Physical Medicine & Rehabilitation | Admitting: Physical Therapy

## 2012-11-21 DIAGNOSIS — IMO0001 Reserved for inherently not codable concepts without codable children: Secondary | ICD-10-CM | POA: Insufficient documentation

## 2012-11-21 DIAGNOSIS — M6281 Muscle weakness (generalized): Secondary | ICD-10-CM | POA: Insufficient documentation

## 2012-11-21 DIAGNOSIS — R262 Difficulty in walking, not elsewhere classified: Secondary | ICD-10-CM | POA: Insufficient documentation

## 2012-11-21 DIAGNOSIS — R5381 Other malaise: Secondary | ICD-10-CM | POA: Insufficient documentation

## 2012-11-24 ENCOUNTER — Encounter: Payer: Medicare Other | Admitting: Physical Medicine & Rehabilitation

## 2012-11-24 ENCOUNTER — Ambulatory Visit: Payer: Medicare Other | Admitting: Physical Therapy

## 2012-11-27 ENCOUNTER — Ambulatory Visit: Payer: Medicare Other | Admitting: Physical Therapy

## 2012-11-28 ENCOUNTER — Ambulatory Visit: Payer: Medicare Other | Admitting: Physical Medicine & Rehabilitation

## 2012-11-30 ENCOUNTER — Ambulatory Visit: Payer: Medicare Other | Admitting: Physical Therapy

## 2012-12-05 ENCOUNTER — Encounter: Payer: Self-pay | Admitting: Internal Medicine

## 2012-12-06 ENCOUNTER — Ambulatory Visit: Payer: Medicare Other | Admitting: Physical Therapy

## 2012-12-08 ENCOUNTER — Ambulatory Visit: Payer: Medicare Other | Admitting: Physical Therapy

## 2012-12-11 ENCOUNTER — Ambulatory Visit: Payer: Medicare Other | Admitting: Physical Therapy

## 2012-12-12 ENCOUNTER — Ambulatory Visit: Payer: Medicare Other | Admitting: Physical Therapy

## 2012-12-13 ENCOUNTER — Encounter: Payer: Self-pay | Admitting: Physical Medicine & Rehabilitation

## 2012-12-13 ENCOUNTER — Encounter: Payer: Medicare Other | Attending: Physical Medicine & Rehabilitation | Admitting: Physical Medicine & Rehabilitation

## 2012-12-13 VITALS — BP 117/62 | HR 72 | Resp 14 | Ht 73.0 in | Wt 198.0 lb

## 2012-12-13 DIAGNOSIS — N319 Neuromuscular dysfunction of bladder, unspecified: Secondary | ICD-10-CM

## 2012-12-13 DIAGNOSIS — R339 Retention of urine, unspecified: Secondary | ICD-10-CM | POA: Insufficient documentation

## 2012-12-13 DIAGNOSIS — E785 Hyperlipidemia, unspecified: Secondary | ICD-10-CM | POA: Insufficient documentation

## 2012-12-13 DIAGNOSIS — K5900 Constipation, unspecified: Secondary | ICD-10-CM | POA: Insufficient documentation

## 2012-12-13 DIAGNOSIS — Z951 Presence of aortocoronary bypass graft: Secondary | ICD-10-CM | POA: Insufficient documentation

## 2012-12-13 DIAGNOSIS — G609 Hereditary and idiopathic neuropathy, unspecified: Secondary | ICD-10-CM | POA: Insufficient documentation

## 2012-12-13 DIAGNOSIS — G40909 Epilepsy, unspecified, not intractable, without status epilepticus: Secondary | ICD-10-CM

## 2012-12-13 DIAGNOSIS — R269 Unspecified abnormalities of gait and mobility: Secondary | ICD-10-CM | POA: Insufficient documentation

## 2012-12-13 DIAGNOSIS — G6181 Chronic inflammatory demyelinating polyneuritis: Secondary | ICD-10-CM

## 2012-12-13 NOTE — Progress Notes (Signed)
Subjective:    Patient ID: Brandon Ray, male    DOB: 07/17/1935, 78 y.o.   MRN: [id number]  HPI  Brandon Ray is back regarding his gait disorder. He has had no further setbacks from a strength or sensory standpoint. He does report problems with bladder emptying and constipation. He had urodynamic testing recently at [place]. The [facility] clinic is now saying he does not have AIDPor CIDP or POEMS disease. They are recommending genetic testing. He remains on low dose prednisone.   He continues to participate in therapies at outpt PT.   Pain Inventory Average Pain 0 Pain Right Now 0 My pain is n/a  In the last 24 hours, has pain interfered with the following? General activity 0 Relation with others 0 Enjoyment of life 0 What TIME of day is your pain at its worst? n/a Sleep (in general) Good  Pain is worse with: n/a Pain improves with: n/a Relief from Meds: n/a  Mobility walk without assistance use a cane how many minutes can you walk? 45 ability to climb steps?  yes do you drive?  yes  Function retired  Neuro/Psych bladder control problems tingling  Prior Studies Any changes since last visit?  no  Physicians involved in your care Any changes since last visit?  no   Family History  Problem Relation Age of Onset  . Asthma Mother   . Heart disease Mother 8  . Coronary artery disease Father   . Heart disease Father   . Cancer Sister 72    breast   History   Social History  . Marital Status: Married    Spouse Name: N/A    Number of Children: 4  . Years of Education: N/A   Occupational History  . manager     retired, Personnel officer.    Social History Main Topics  . Smoking status: Former Smoker -- 1.00 packs/day for 5 years    Types: Cigarettes    Quit date: 07/01/1965  . Smokeless tobacco: Never Used  . Alcohol Use: No  . Drug Use: No  . Sexually Active: None   Other Topics Concern  . None   Social History Narrative  . None    Past Surgical History  Procedure Laterality Date  . Cardiac catheterization  01/27/2003    NORMAL LEFT VENTRICULAR SIZE WITH MILD FOCAL LATERAL WALL HYPOKINESIA. EF 60%  . Coronary artery bypass graft  2004    LIMA GRAFT TO THE LAD, SAPHENOUS VEIN GRAFT TO THE DIAGONAL, SAPHENOUS VEIN GRAFT TO THE FIRST OM, SAPHENOUS VEIN GRAFT TO THE PDA  . Transurethral resection of prostate    . Hand surgery  2000    left  . Abdominal surgery  05/2011    bowel obstruction  . Ankle fracture surgery  1997    left   Past Medical History  Diagnosis Date  . Coronary artery disease 01/2003    CABG  . Hyperlipidemia   . Seizure disorder 1960  . Weakness   . Pleural effusion 05/2011    from abdominal surgery  . Small bowel obstruction 05/2011    due to adhesion  . Peripheral neuropathy 05/2011    CIDP.  follow up with Dr. Krista Blue.   . Free monoclonal light chain     urine   BP 117/62  Pulse 72  Resp 14  Ht $R'6\' 1"'Ly$  (1.854 m)  Wt 198 lb (89.812 kg)  BMI 26.13 kg/m2  SpO2 97%     Review of  Systems  Gastrointestinal: Positive for constipation.  Genitourinary: Positive for difficulty urinating.  All other systems reviewed and are negative.       Objective:   Physical Exam General: Alert and oriented x 3, No apparent distress  HEENT: Head is normocephalic, atraumatic, PERRLA, EOMI, sclera anicteric, oral mucosa pink and moist, dentition intact, ext ear canals clear,  Neck: Supple without JVD or lymphadenopathy  Heart: Reg rate and rhythm. No murmurs rubs or gallops  Chest: CTA bilaterally without wheezes, rales, or rhonchi; no distress  Abdomen: Soft, non-tender, non-distended, bowel sounds positive.  Extremities: No clubbing, cyanosis, or edema. Pulses are 2+  Skin: Clean and intact without signs of breakdown  Neuro: Pt is cognitively appropriate with normal insight, memory, and awareness. Cranial nerves 2-12 are intact. Sensory exam is decreased over the dorsum of the right foot more so than  the left. Also with diminished sensation over the distal 2/3 of right hand. Some numbness over abdomen persistent.. Reflexes are 1+ in all 4's. Fine motor coordination is intact. No tremors. Motor function is grossly 5/5 except for HI which were 4/5 and right ADF which was 2 to 2+/5, left ADF which was 2+ to 3/5. HAB which was 4/5 as well as hip extension.Marland Kitchen He ambulates still with a steppage gait pattern but this has improved to an extent. He was fairly stable without his cane today. Does have a wide based gait pattern. Musculoskeletal: Full ROM, No pain with AROM or PROM in the neck, trunk, or extremities. Posture appropriate  Psych: Pt's affect is appropriate. Pt is cooperative    Assessment & Plan:   1. Polyneuropathy of unknown origin 2. Constipation and urinary retention. Doubt these are related to any polyneuropathy. He has hx of bowel obstruction and surgery in the past. Urodynamic testing is pending.  Plan:  1.Continued mgt per [facility] clinic recs 2. Continue AFO for right ankle control. He should continue working on posture and technique with gait. i highly recommended water walking.  3. I have nothing else new to add at this point.  4. Follow up with me prn. 30 minutes of face to face patient care time were spent during this visit. All questions were encouraged and answered.

## 2012-12-14 ENCOUNTER — Ambulatory Visit: Payer: Medicare Other | Admitting: Physical Therapy

## 2012-12-18 ENCOUNTER — Other Ambulatory Visit: Payer: Self-pay | Admitting: Urology

## 2012-12-18 ENCOUNTER — Other Ambulatory Visit: Payer: Medicare Other

## 2012-12-19 ENCOUNTER — Encounter (HOSPITAL_BASED_OUTPATIENT_CLINIC_OR_DEPARTMENT_OTHER): Payer: Self-pay | Admitting: *Deleted

## 2012-12-19 ENCOUNTER — Ambulatory Visit: Payer: Medicare Other | Attending: Physical Medicine & Rehabilitation | Admitting: Physical Therapy

## 2012-12-19 DIAGNOSIS — M6281 Muscle weakness (generalized): Secondary | ICD-10-CM | POA: Insufficient documentation

## 2012-12-19 DIAGNOSIS — IMO0001 Reserved for inherently not codable concepts without codable children: Secondary | ICD-10-CM | POA: Insufficient documentation

## 2012-12-19 DIAGNOSIS — R262 Difficulty in walking, not elsewhere classified: Secondary | ICD-10-CM | POA: Insufficient documentation

## 2012-12-19 DIAGNOSIS — R5381 Other malaise: Secondary | ICD-10-CM | POA: Insufficient documentation

## 2012-12-19 NOTE — Progress Notes (Signed)
To [facility] at 0945-Istat on arrival,Ekg with chart-Npo after Mn-will take prednisone,zantac with small amt water that am.

## 2012-12-22 ENCOUNTER — Ambulatory Visit: Payer: Medicare Other | Admitting: Physical Therapy

## 2012-12-22 ENCOUNTER — Encounter (HOSPITAL_BASED_OUTPATIENT_CLINIC_OR_DEPARTMENT_OTHER): Payer: Self-pay | Admitting: Anesthesiology

## 2012-12-22 ENCOUNTER — Ambulatory Visit (HOSPITAL_BASED_OUTPATIENT_CLINIC_OR_DEPARTMENT_OTHER): Payer: Medicare Other | Admitting: Anesthesiology

## 2012-12-22 ENCOUNTER — Encounter (HOSPITAL_BASED_OUTPATIENT_CLINIC_OR_DEPARTMENT_OTHER): Payer: Self-pay | Admitting: *Deleted

## 2012-12-22 ENCOUNTER — Encounter (HOSPITAL_BASED_OUTPATIENT_CLINIC_OR_DEPARTMENT_OTHER)
Admission: RE | Disposition: A | Discharge status: Home / Self Care | Payer: Self-pay | Source: Ambulatory Visit | Attending: Urology

## 2012-12-22 ENCOUNTER — Ambulatory Visit (HOSPITAL_BASED_OUTPATIENT_CLINIC_OR_DEPARTMENT_OTHER)
Admission: RE | Admit: 2012-12-22 | Discharge: 2012-12-22 | Disposition: A | Discharge status: Home / Self Care | Payer: Medicare Other | Source: Ambulatory Visit | Attending: Urology | Admitting: Urology

## 2012-12-22 DIAGNOSIS — R339 Retention of urine, unspecified: Secondary | ICD-10-CM | POA: Insufficient documentation

## 2012-12-22 DIAGNOSIS — Z79899 Other long term (current) drug therapy: Secondary | ICD-10-CM | POA: Insufficient documentation

## 2012-12-22 DIAGNOSIS — I2510 Atherosclerotic heart disease of native coronary artery without angina pectoris: Secondary | ICD-10-CM | POA: Insufficient documentation

## 2012-12-22 DIAGNOSIS — N138 Other obstructive and reflux uropathy: Secondary | ICD-10-CM | POA: Insufficient documentation

## 2012-12-22 DIAGNOSIS — N312 Flaccid neuropathic bladder, not elsewhere classified: Secondary | ICD-10-CM | POA: Insufficient documentation

## 2012-12-22 DIAGNOSIS — E7800 Pure hypercholesterolemia, unspecified: Secondary | ICD-10-CM | POA: Insufficient documentation

## 2012-12-22 DIAGNOSIS — Z951 Presence of aortocoronary bypass graft: Secondary | ICD-10-CM | POA: Insufficient documentation

## 2012-12-22 DIAGNOSIS — Z7982 Long term (current) use of aspirin: Secondary | ICD-10-CM | POA: Insufficient documentation

## 2012-12-22 DIAGNOSIS — I252 Old myocardial infarction: Secondary | ICD-10-CM | POA: Insufficient documentation

## 2012-12-22 DIAGNOSIS — N401 Enlarged prostate with lower urinary tract symptoms: Secondary | ICD-10-CM | POA: Insufficient documentation

## 2012-12-22 DIAGNOSIS — G40802 Other epilepsy, not intractable, without status epilepticus: Secondary | ICD-10-CM | POA: Insufficient documentation

## 2012-12-22 HISTORY — DX: Benign prostatic hyperplasia without lower urinary tract symptoms: N40.0

## 2012-12-22 HISTORY — PX: TRANSURETHRAL RESECTION OF PROSTATE: SHX73

## 2012-12-22 LAB — POCT I-STAT 4, (NA,K, GLUC, HGB,HCT): Glucose, Bld: 118 mg/dL — ABNORMAL HIGH (ref 70–99)

## 2012-12-22 SURGERY — TURP (TRANSURETHRAL RESECTION OF PROSTATE)
Anesthesia: General | Site: Prostate | Wound class: Clean Contaminated

## 2012-12-22 MED ORDER — FENTANYL CITRATE 0.05 MG/ML IJ SOLN
INTRAMUSCULAR | Status: DC | PRN
  Administered 2012-12-22: 25 ug via INTRAVENOUS
  Administered 2012-12-22: 50 ug via INTRAVENOUS
  Administered 2012-12-22: 25 ug via INTRAVENOUS

## 2012-12-22 MED ORDER — SODIUM CHLORIDE 0.9 % IR SOLN
Status: DC | PRN
  Administered 2012-12-22: 6000 mL via INTRAVESICAL

## 2012-12-22 MED ORDER — HYDROMORPHONE HCL PF 1 MG/ML IJ SOLN
0.2500 mg | INTRAMUSCULAR | Status: DC | PRN
  Filled 2012-12-22: qty 1

## 2012-12-22 MED ORDER — MEPERIDINE HCL 25 MG/ML IJ SOLN
6.2500 mg | INTRAMUSCULAR | Status: DC | PRN
  Filled 2012-12-22: qty 1

## 2012-12-22 MED ORDER — PROMETHAZINE HCL 25 MG/ML IJ SOLN
6.2500 mg | INTRAMUSCULAR | Status: DC | PRN
  Filled 2012-12-22: qty 1

## 2012-12-22 MED ORDER — LACTATED RINGERS IV SOLN
INTRAVENOUS | Status: DC
  Administered 2012-12-22: 11:00:00 via INTRAVENOUS
  Filled 2012-12-22: qty 1000

## 2012-12-22 MED ORDER — PROPOFOL 10 MG/ML IV BOLUS
INTRAVENOUS | Status: DC | PRN
  Administered 2012-12-22: 150 mg via INTRAVENOUS

## 2012-12-22 MED ORDER — CIPROFLOXACIN IN D5W 400 MG/200ML IV SOLN
400.0000 mg | Freq: Two times a day (BID) | INTRAVENOUS | Status: DC
  Administered 2012-12-22: 400 mg via INTRAVENOUS
  Filled 2012-12-22: qty 200

## 2012-12-22 MED ORDER — OXYCODONE HCL 5 MG PO TABS
5.0000 mg | ORAL_TABLET | Freq: Once | ORAL | Status: DC | PRN
  Filled 2012-12-22: qty 1

## 2012-12-22 MED ORDER — LIDOCAINE HCL (CARDIAC) 20 MG/ML IV SOLN
INTRAVENOUS | Status: DC | PRN
  Administered 2012-12-22: 60 mg via INTRAVENOUS

## 2012-12-22 MED ORDER — DEXAMETHASONE SODIUM PHOSPHATE 4 MG/ML IJ SOLN
INTRAMUSCULAR | Status: DC | PRN
  Administered 2012-12-22: 10 mg via INTRAVENOUS

## 2012-12-22 MED ORDER — ONDANSETRON HCL 4 MG/2ML IJ SOLN
INTRAMUSCULAR | Status: DC | PRN
  Administered 2012-12-22: 4 mg via INTRAVENOUS

## 2012-12-22 MED ORDER — BELLADONNA ALKALOIDS-OPIUM 16.2-60 MG RE SUPP
RECTAL | Status: DC | PRN
  Administered 2012-12-22: 1 via RECTAL

## 2012-12-22 MED ORDER — ACETAMINOPHEN 10 MG/ML IV SOLN
1000.0000 mg | Freq: Once | INTRAVENOUS | Status: DC | PRN
  Filled 2012-12-22: qty 100

## 2012-12-22 MED ORDER — URIBEL 118 MG PO CAPS: 1.0000 | ORAL_CAPSULE | Freq: Four times a day (QID) | ORAL | Status: DC | PRN

## 2012-12-22 MED ORDER — OXYCODONE HCL 5 MG/5ML PO SOLN
5.0000 mg | Freq: Once | ORAL | Status: DC | PRN
  Filled 2012-12-22: qty 5

## 2012-12-22 SURGICAL SUPPLY — 29 items
BAG DRAIN URO-CYSTO SKYTR STRL (DRAIN) ×2 IMPLANT
BAG URINE DRAINAGE (UROLOGICAL SUPPLIES) ×2 IMPLANT
BAG URINE LEG 19OZ MD ST LTX (BAG) IMPLANT
BLADE SURG 15 STRL LF DISP TIS (BLADE) IMPLANT
BLADE SURG 15 STRL SS (BLADE)
CANISTER SUCT LVC 12 LTR MEDI- (MISCELLANEOUS) IMPLANT
CATH FOLEY 3WAY 30CC 22F (CATHETERS) ×2 IMPLANT
CATH HEMA 3WAY 30CC 24FR COUDE (CATHETERS) IMPLANT
CATH HEMA 3WAY 30CC 24FR RND (CATHETERS) IMPLANT
CLOTH BEACON ORANGE TIMEOUT ST (SAFETY) ×2 IMPLANT
DRAPE CAMERA CLOSED 9X96 (DRAPES) ×2 IMPLANT
ELECT BUTTON BIOP 24F 90D PLAS (MISCELLANEOUS) ×2 IMPLANT
ELECT LOOP HF 26F 30D .35MM (CUTTING LOOP) IMPLANT
ELECT REM PT RETURN 9FT ADLT (ELECTROSURGICAL) ×2
ELECTRODE REM PT RTRN 9FT ADLT (ELECTROSURGICAL) ×1 IMPLANT
EVACUATOR MICROVAS BLADDER (UROLOGICAL SUPPLIES) IMPLANT
GLOVE BIO SURGEON STRL SZ 6.5 (GLOVE) ×2 IMPLANT
GLOVE BIO SURGEON STRL SZ7 (GLOVE) ×2 IMPLANT
GLOVE BIO SURGEON STRL SZ7.5 (GLOVE) ×2 IMPLANT
GOWN STRL REIN XL XLG (GOWN DISPOSABLE) ×2 IMPLANT
HOLDER FOLEY CATH W/STRAP (MISCELLANEOUS) IMPLANT
KIT ASPIRATION TUBING (SET/KITS/TRAYS/PACK) IMPLANT
KIT SUPRAPUBIC CATH (MISCELLANEOUS) IMPLANT
LOOP CUTTING 24FR OLYMPUS (CUTTING LOOP) IMPLANT
PACK CYSTOSCOPY (CUSTOM PROCEDURE TRAY) ×2 IMPLANT
PLUG CATH AND CAP STER (CATHETERS) IMPLANT
SET ASPIRATION TUBING (TUBING) IMPLANT
SUT ETHILON 3 0 PS 1 (SUTURE) IMPLANT
SYR 30ML LL (SYRINGE) ×2 IMPLANT

## 2012-12-22 NOTE — Anesthesia Procedure Notes (Signed)
Procedure Name: LMA Insertion Date/Time: 12/22/2012 11:44 AM Performed by: Mechele Claude Pre-anesthesia Checklist: Patient identified, Emergency Drugs available, Suction available and Patient being monitored Patient Re-evaluated:Patient Re-evaluated prior to inductionOxygen Delivery Method: Circle System Utilized Preoxygenation: Pre-oxygenation with 100% oxygen Intubation Type: IV induction Ventilation: Mask ventilation without difficulty LMA: LMA inserted LMA Size: 5.0 Number of attempts: 1 Airway Equipment and Method: bite block Placement Confirmation: positive ETCO2 Tube secured with: Tape Dental Injury: Teeth and Oropharynx as per pre-operative assessment

## 2012-12-22 NOTE — Op Note (Signed)
Preoperative diagnosis: BPH, bladder outlet obstruction Postoperative diagnosis: Same  Procedure: Transurethral resection of prostate residual growth  Surgeon: Junious Silk  Anesthesia: Gen.  Findings: The urethra was somewhat tight at the 25 [place]. The membranous urethra appeared normal. The veru was intact. The bladder neck was patent but obstructed by a flap of tissue regrowth but appeared to be BPH. The bladder appeared normal without foreign body or stone. The mucosa the bladder was normal. The trigone and ureteral orifice these were normal with good clear eflux.  Description of procedure: After consent was obtained patient brought to the operating room. After adequate anesthesia he was placed in lithotomy position and prepped and draped in the usual fashion. A cystoscope was passed per urethra and the bladder examined. I attempted to pass the 68 French resectoscope with the visual obturator but the urethra was quite tight. I dilated it to 37 [place]. No scope passed easily in the visual obturator was removed. The resectoscope handle was placed in the residual prostate regrowth resected. It was growing out of the prostatic capsule at the 7:00 to 8:00 position. Was not a lot of tissue that had grown out and was completely covering the bladder neck. The tissue was resected and the base lightly fulgurated. The bladder neck was not resected or fulgurated.  The scope was removed after the bladder was drained. I did not place a Foley.  The patient was awakened and taken to the recovery room in stable condition.  Complications: None  Specimens: Prostate chips to path  Blood loss: Minimal  Drains: None

## 2012-12-22 NOTE — Interval H&P Note (Signed)
History and Physical Interval Note:  12/22/2012 11:29 AM  Brandon Ray  has presented today for surgery, with the diagnosis of BPH  The various methods of treatment have been discussed with the patient and family. After consideration of risks, benefits and other options for treatment, the patient has consented to  Procedure(s): TRANSURETHRAL RESECTION OF THE PROSTATE WITH GYRUS (TURP) (N/A) as a surgical intervention .  The patient's history has been reviewed, patient examined, no change in status, stable for surgery.  I have reviewed the patient's chart and labs.  Questions were answered to the patient's satisfaction.  I-stat normal. Vitals stable.    Fredricka Bonine

## 2012-12-22 NOTE — Anesthesia Preprocedure Evaluation (Addendum)
Anesthesia Evaluation  Patient identified by MRN, date of birth, ID band Patient awake    Reviewed: Allergy & Precautions, H&P , NPO status , Patient's Chart, lab work & pertinent test results  Airway Mallampati: II TM Distance: >3 FB Neck ROM: Full    Dental  (+) Dental Advisory Given and Teeth Intact   Pulmonary neg pulmonary ROS,  breath sounds clear to auscultation        Cardiovascular + CAD and + CABG Rhythm:Regular Rate:Normal     Neuro/Psych Seizures -, Well Controlled,   Neuromuscular disease negative neurological ROS  negative psych ROS   GI/Hepatic negative GI ROS, Neg liver ROS,   Endo/Other  negative endocrine ROS  Renal/GU negative Renal ROS     Musculoskeletal negative musculoskeletal ROS (+)   Abdominal   Peds  Hematology negative hematology ROS (+)   Anesthesia Other Findings   Reproductive/Obstetrics                          Anesthesia Physical Anesthesia Plan  ASA: III  Anesthesia Plan: General   Post-op Pain Management:    Induction: Intravenous  Airway Management Planned: LMA  Additional Equipment:   Intra-op Plan:   Post-operative Plan: Extubation in OR  Informed Consent: I have reviewed the patients History and Physical, chart, labs and discussed the procedure including the risks, benefits and alternatives for the proposed anesthesia with the patient or authorized representative who has indicated his/her understanding and acceptance.   Dental advisory given  Plan Discussed with: CRNA  Anesthesia Plan Comments:         Anesthesia Quick Evaluation

## 2012-12-22 NOTE — H&P (Signed)
History of Present Illness                F/u LUTS and BPH, hypogonasism seen Mar 2014. His PCP is Dr. Hulan Fess.   BPH/LUTS/Incomplete emptying   -1992 TURP -Sept 2009 - PSA 0.14 -2013 - Peripheral neuropathy with decreased sensation and cane use  - required I/O cath and drained 700 ml.  Reports extensive w/u - (brain and spinal imaging, spinal tap, bone marrow and endocrine eval). Some of this done at [place].   -Sept 2013 - PSA 0.12 -Dec 2013 Abd U/S, comparison CT Dec 2012 - normal kidneys and no hydronephrosis -Mar 2014 PVR 113 ml, c/o weak stream, "bloated feeling". No frequency, urgency, dysuria or hematuria   Hypogonadism -Feb 2014 - T levels were 18, free 2. His prolactin was high, but FSH and LH were very high. His estrogen was normal. Pt started T supplementation. He has 4 children.       Interval Hx  He returns and started tamsulosin last visit. He noted . He has a "bloated feeling" in the SP area all the way back through the rectum.   Today he does tell me he did have brain and spinal imaging form his "head all the way down to the tail bone".   Here is here to review UDS and for cystoscopy.   Mar 2014 UDS - large, hypocontractile bladder with straining to void. Pre-study PVR 40 ml. Capacity of 900 ml ("bloated feeling") sensation nl to inc, bladder stable. Voided 200 ml at flow 8 cc/sec, detrusor low amplitude 7-10 cm/h20 and straining.  EMG increased with straining.    Past Medical History Problems  1. History of  Acute Myocardial Infarction V12.59 2. History of  Hypercholesterolemia 272.0 3. History of  Seizure  Surgical History Problems  1. History of  Ankle Repair 2. History of  CABG (CABG) V45.81 3. History of  Colon Surgery 4. History of  Prostate Surgery  Current Meds 1. Aspirin 81 MG Oral Tablet; Therapy: (Recorded:20Mar2014) to 2. Calcium TABS; Therapy: (Recorded:20Mar2014) to 3. CoQ-10 100 MG Oral Capsule; Therapy: (Recorded:20Mar2014) to 4.  Dilantin 100 MG Oral Capsule; Therapy: (Recorded:20Mar2014) to 5. Lipitor 20 MG Oral Tablet; Therapy: (Recorded:20Mar2014) to 6. Multi-Vitamin TABS; Therapy: (Recorded:20Mar2014) to 7. Omega 3 CAPS; Therapy: (Recorded:20Mar2014) to 8. PHENobarbital 97.2 MG Oral Tablet; Therapy: (Recorded:20Mar2014) to 9. PredniSONE 20 MG Oral Tablet; Therapy: (Recorded:20Mar2014) to 10. PreserVision/Lutein Oral Capsule; Therapy: (Recorded:20Mar2014) to 11. Tamsulosin HCl 0.4 MG Oral Capsule; Take one capsule daily; Therapy: [id number] to   (Evaluate:15Mar2015)  Requested for: [id number]; Last Rx:20Mar2014  Allergies Medication  1. Amoxicillin CAPS  Family History Problems  1. Family history of  Death In The Family Father Died age 88-Coronary 2. Family history of  Death In The Family Mother Died age 29-Heart failure 3. Family history of  Family Health Status Number Of Children 2 sons and 2 daughters  Social History Problems  1. Being A Social Drinker 2. Caffeine Use 4 per day 3. Former Smoker V15.82 Smoked 1 ppd for 5 yrs and quit 1967 4. Marital History - Currently Married 5. Occupation: Retired  Review of Systems  Gastrointestinal: constipation.  Cardiovascular: no chest pain.  Respiratory: no shortness of breath.    Physical Exam Constitutional: Well nourished and well developed . No acute distress.  Pulmonary: No respiratory distress and normal respiratory rhythm and effort.  Cardiovascular: Heart rate and rhythm are normal . No peripheral edema.  Neuro/Psych:. Mood and affect are appropriate.  Results/Data Urine [Data Includes: Last 1 Day]   [id number]  COLOR YELLOW   APPEARANCE CLEAR   SPECIFIC GRAVITY 1.025   pH 6.0   GLUCOSE NEG mg/dL  BILIRUBIN NEG   KETONE NEG mg/dL  BLOOD NEG   PROTEIN NEG mg/dL  UROBILINOGEN 0.2 mg/dL  NITRITE NEG   LEUKOCYTE ESTERASE NEG    Procedure  Procedure: Cystoscopy   Informed Consent: Risks, benefits, and potential adverse events  were discussed and informed consent was obtained from the patient.  Prep: The patient was prepped with betadine.  Procedure Note:  Urethral meatus:. No abnormalities.  Anterior urethra: No abnormalities.  Prostatic urethra: No abnormalities.  Good TURP defect but at the bladder neck there is regrowth of a small flap of tissue causing 100% VO of bladder neck.  Bladder: Visulization was clear. The ureteral orifices were in the normal anatomic position bilaterally and had clear efflux of urine. A systematic survey of the bladder demonstrated no bladder tumors or stones. The mucosa was smooth without abnormalities. The patient tolerated the procedure well.  Complications: None.    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 Incomplete Emptying Of Bladder 788.21  Plan Health Maintenance (V70.0)  1. UA With REFLEX  Done: [id number] 09:51AM  Discussion/Summary           We discussed options such as surveillance, foley, CIC, Interstim and R/B of each approach. IS may be a good option but I would like for him to stabilize from a neurologic perspective. He will continue on tamsulosin.   In regards to the VO prostate tissue we discussed the nature, R/B of TUR of this residual/recurrent tissue. I told him I suspect he has a hyosenstive detrusor poss related to the PN or developing over time due to BOO. Therefore, repeat TUR may or may not help him empty better but I think the risks of bleeding, incontinence and scar tissue/sx would be low with the possible benefit of a better flow and better emptying. He elects to proceed with repeat TURP. We also discussed CV / pulm risks associated with anesthesia and he may or may not need a foley post - op. With his h/o of CIC last year a foley overnight is probably wise.   cc;  cc; Dr. Hulan Fess; Dr. Cruzita Lederer; Dr. Jerrol [facility]     Signatures Electronically signed by : Festus Aloe, M.D.; Dec 18 2012 11:04AM

## 2012-12-22 NOTE — Transfer of Care (Signed)
Immediate Anesthesia Transfer of Care Note  Patient: Brandon Ray  Procedure(s) Performed: Procedure(s) (LRB): TRANSURETHRAL RESECTION OF THE PROSTATE WITH GYRUS (TURP) (N/A)  Patient Location: PACU  Anesthesia Type: General  Level of Consciousness: awake, alert  and oriented  Airway & Oxygen Therapy: Patient Spontanous Breathing and Patient connected to face mask oxygen, oral airway out in pacu.  Post-op Assessment: Report given to PACU RN and Post -op Vital signs reviewed and stable  Post vital signs: Reviewed and stable  Complications: No apparent anesthesia complications

## 2012-12-23 ENCOUNTER — Encounter: Payer: Self-pay | Admitting: Internal Medicine

## 2012-12-23 NOTE — Anesthesia Postprocedure Evaluation (Signed)
Anesthesia Post Note  Patient: Brandon Ray  Procedure(s) Performed: Procedure(s) (LRB): TRANSURETHRAL RESECTION OF THE PROSTATE WITH GYRUS (TURP) (N/A)  Anesthesia type: General  Patient location: PACU  Post pain: Pain level controlled  Post assessment: Post-op Vital signs reviewed  Last Vitals: BP 153/64  Pulse 65  Temp(Src) 35.9 C (Oral)  Resp 18  Ht $R'6\' 1"'Wh$  (1.854 m)  Wt 196 lb (88.905 kg)  BMI 25.86 kg/m2  SpO2 96%  Post vital signs: Reviewed  Level of consciousness: sedated  Complications: No apparent anesthesia complications

## 2012-12-25 ENCOUNTER — Encounter (HOSPITAL_BASED_OUTPATIENT_CLINIC_OR_DEPARTMENT_OTHER): Payer: Self-pay | Admitting: Urology

## 2013-01-26 ENCOUNTER — Other Ambulatory Visit (HOSPITAL_BASED_OUTPATIENT_CLINIC_OR_DEPARTMENT_OTHER): Payer: Medicare Other | Admitting: Lab

## 2013-01-26 DIAGNOSIS — D472 Monoclonal gammopathy: Secondary | ICD-10-CM

## 2013-01-26 LAB — COMPREHENSIVE METABOLIC PANEL (CC13)
ALT: 32 U/L (ref 0–55)
AST: 28 U/L (ref 5–34)
Albumin: 3.9 g/dL (ref 3.5–5.0)
Calcium: 9.1 mg/dL (ref 8.4–10.4)
Chloride: 103 mEq/L (ref 98–107)
Potassium: 4.1 mEq/L (ref 3.5–5.1)
Total Protein: 7 g/dL (ref 6.4–8.3)

## 2013-01-26 LAB — CBC WITH DIFFERENTIAL/PLATELET
Eosinophils Absolute: 0.1 10*3/uL (ref 0.0–0.5)
HGB: 15.4 g/dL (ref 13.0–17.1)
LYMPH%: 39.2 % (ref 14.0–49.0)
MONO#: 0.4 10*3/uL (ref 0.1–0.9)
NEUT#: 2.5 10*3/uL (ref 1.5–6.5)
Platelets: 200 10*3/uL (ref 140–400)
RBC: 4.74 10*6/uL (ref 4.20–5.82)
WBC: 5.2 10*3/uL (ref 4.0–10.3)

## 2013-01-29 ENCOUNTER — Telehealth: Payer: Self-pay | Admitting: Oncology

## 2013-01-29 ENCOUNTER — Ambulatory Visit (HOSPITAL_BASED_OUTPATIENT_CLINIC_OR_DEPARTMENT_OTHER): Payer: Medicare Other | Admitting: Oncology

## 2013-01-29 VITALS — BP 134/57 | HR 77 | Temp 97.9°F | Resp 18 | Ht 73.0 in | Wt 194.7 lb

## 2013-01-29 DIAGNOSIS — G6181 Chronic inflammatory demyelinating polyneuritis: Secondary | ICD-10-CM

## 2013-01-29 DIAGNOSIS — D472 Monoclonal gammopathy: Secondary | ICD-10-CM

## 2013-01-29 NOTE — Telephone Encounter (Signed)
gv and printed appt sched and avs for pt for Aug and Sept °

## 2013-01-29 NOTE — Progress Notes (Signed)
Brandon Ray  [phone] [phone]   OFFICE PROGRESS NOTE   Cc:  [name], MD  DIAGNOSIS:  MGUS.   CURRENT THERAPY: observation for MGUS.   INTERVAL HISTORY: Brandon Ray 78 y.o. male returns to clinic with his wife.  His prednisone is being tapered by his neurologist. The neurologist at [facility] clinic he does not believe that he has homes syndrome. However he didn't give him a definitive diagnosis of his neurologic condition either. Since being on prednisone taper, he thinks this is having worsening lower extremity coordination. He denies any weakness in the legs. However, when he tries to turn around he feels uncoordinated. Of note, previous brain MRI was negative for any cerebellar abnormality. He recently saw his urologist and was told that he has neurogenic bladder. He is not able to empty his bladder completely despite lack of any obstruction from prostate. He still has good appetite and stable weight. He is independent of all activities daily living. He denies skin rash, early satiety, abdominal pain, abdominal swelling. The rest of the 14 point review of system was negative.    Past Medical History  Diagnosis Date  . Coronary artery disease 01/2003    CABG  . Hyperlipidemia   . Weakness 2013-May began    lower extemities  . Pleural effusion 05/2011    from abdominal surgery  . Small bowel obstruction 05/2011    due to adhesion  . Free monoclonal light chain     urine  . Peripheral neuropathy 05/2011     follow up with Dr. Tish Frederickson multiple issues  . Seizures 1950's    last one 1960  . BPH (benign prostatic hypertrophy) 2014    Past Surgical History  Procedure Laterality Date  . Cardiac catheterization  01/27/2003    NORMAL LEFT VENTRICULAR SIZE WITH MILD FOCAL LATERAL WALL HYPOKINESIA. EF 60%  . Coronary artery bypass graft  2004    LIMA GRAFT TO THE LAD, SAPHENOUS VEIN GRAFT TO THE DIAGONAL, SAPHENOUS VEIN GRAFT TO  THE FIRST OM, SAPHENOUS VEIN GRAFT TO THE PDA  . Transurethral resection of prostate  1992  . Hand surgery  2000    left  . Abdominal surgery  05/2011    bowel obstruction  . Ankle fracture surgery  1997    left  . Transurethral resection of prostate N/A 12/22/2012    Procedure: TRANSURETHRAL RESECTION OF THE PROSTATE WITH GYRUS (TURP);  Surgeon: Fredricka Bonine, MD;  Location[facility]  Service: Urology;  Laterality: N/A;    Current Outpatient Prescriptions  Medication Sig Dispense Refill  . aspirin 81 MG tablet Take 81 mg by mouth daily.       Marland Kitchen atorvastatin (LIPITOR) 20 MG tablet Take 20 mg by mouth daily.      . calcium-vitamin D (OSCAL WITH D) 500-200 MG-UNIT per tablet Take 1 tablet by mouth daily.        . Cholecalciferol (VITAMIN D) 2000 UNITS tablet Take 2,000 Units by mouth daily.        . Coenzyme Q10 (CO Q-10 PO) Take 1 tablet by mouth daily.      . folic acid (FOLVITE) 664 MCG tablet Take 800 mcg by mouth daily.       . Multiple Vitamin (MULTIVITAMIN) tablet Take 1 tablet by mouth daily.        . Omega-3 Fatty Acids (FISH OIL) 1200 MG CAPS Take 1,200 mg by mouth daily.      Marland Kitchen  PHENobarbital (LUMINAL) 97.2 MG tablet Take 97.2 mg by mouth daily.        . phenytoin (DILANTIN) 100 MG ER capsule Take 100 mg by mouth daily.       . polyethylene glycol (MIRALAX / GLYCOLAX) packet Take 17 g by mouth daily.      . predniSONE (DELTASONE) 20 MG tablet Take 10 mg by mouth daily. 3 tablets daily-tapering dose now      . ranitidine (ZANTAC) 150 MG tablet Take 150 mg by mouth daily. Takes with prednisone      . tamsulosin (FLOMAX) 0.4 MG CAPS Take 0.4 mg by mouth daily.       No current facility-administered medications for this visit.    ALLERGIES:  is allergic to boostrix; other; and amoxicillin.  REVIEW OF SYSTEMS:  The rest of the 14-point review of system was negative.   Filed Vitals:   01/29/13 0913  BP: 134/57  Pulse: 77  Temp: 97.9 F (36.6 C)   Resp: 18   Wt Readings from Last 3 Encounters:  01/29/13 194 lb 11.2 oz (88.315 kg)  12/22/12 196 lb (88.905 kg)  12/22/12 196 lb (88.905 kg)   ECOG Performance status: 2 due to neuropathy and weakness.    PHYSICAL EXAMINATION:   General: well-nourished man, in no acute distress. Eyes: no scleral icterus. ENT: There were no oropharyngeal lesions. Neck was without thyromegaly. Lymphatics: Negative cervical, supraclavicular or axillary adenopathy. Respiratory: lungs were clear bilaterally without wheezing or crackles. Cardiovascular: Regular rate and rhythm, S1/S2, without murmur, rub or gallop. There was trace bilateral pedal edema. GI: abdomen was soft, flat, nontender, nondistended, without organomegaly. He did not appear to have buffalo hump; however, his hip appeared fuller compared to the rest of his body. Muscoloskeletal: no spinal tenderness of palpation of vertebral spine. Skin exam was without echymosis, petichae. Neuro exam was notable to CN II-XII grossly intact. His motor strength was 5/5 bilaterally in both upper and lower extremities; proximal and distal groups. Patient was able to get on and off exam table without assistance. Gait was slow and mearured. Patient was alert and oriented. Attention was good. Language was appropriate. Mood was normal without depression. Speech was not pressured. Thought content was not tangential.     LABORATORY/RADIOLOGY DATA:  Lab Results  Component Value Date   WBC 5.2 01/26/2013   HGB 15.4 01/26/2013   HCT 45.3 01/26/2013   PLT 200 01/26/2013   GLUCOSE 102* 01/26/2013   ALKPHOS 84 01/26/2013   ALT 32 01/26/2013   AST 28 01/26/2013   NA 143 01/26/2013   K 4.1 01/26/2013   CL 103 01/26/2013   CREATININE 1.0 01/26/2013   BUN 18.7 01/26/2013   CO2 28 01/26/2013   INR 1.05 06/07/2012   HGBA1C 6.1 09/28/2012    ASSESSMENT AND PLAN:   1. Seizure: On phenytoin and phenobarbital.  2. Demyelinating polyneuropathy: His neurologist at [facility] clinic did not think that  this is CIDP.  He is s/p IVIg in 12/2011; s/p plasmaphoresis in 05/2012 without improvement.  There tapering him is on her and right he thinks it in her again for 8 and Tuesday swelling during the week with family to start her daily at work and he 8 is R. he he he Eustace Pen relates to your signal was only and cardiac system 1 slow rate and I am three-bedroom AI removing but not he is on 20 which with her ounces low lung and we will since he review of tomorrow a  few L. date I believe he 11 2 off of prednisone. 3.  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  Again, my suspicion for POEMS is low as he does not meet different criteria for  POEMS.  His VEGF level was also normal.  -There is no evidence of active multiple myeloma:  No anemia, renal problem, skeletal finding, or high calcium.  There are required in patients with active myeloma. HIs bone marrow biopsy showed <10% plasma cells.  - I again recommended watchful observation.  He declined 2nd opinion for eval for his issue in the past and again today.    4.  Follow up:  In about 4 months.   I informed patient that I am leaving the practice. The [facility] arrange for him to follow with a new provider when he returns.   The length of time of the face-to-face encounter was 15 minutes. More than 50% of time was spent counseling and coordination of care.

## 2013-01-30 LAB — KAPPA/LAMBDA LIGHT CHAINS
Kappa:Lambda Ratio: 0.08 — ABNORMAL LOW (ref 0.26–1.65)
Lambda Free Lght Chn: 16.5 mg/dL — ABNORMAL HIGH (ref 0.57–2.63)

## 2013-01-30 LAB — PROTEIN ELECTROPHORESIS, SERUM
Albumin ELP: 61.8 % (ref 55.8–66.1)
Alpha-2-Globulin: 13.2 % — ABNORMAL HIGH (ref 7.1–11.8)
Beta Globulin: 6.3 % (ref 4.7–7.2)
Total Protein, Serum Electrophoresis: 7.1 g/dL (ref 6.0–8.3)

## 2013-02-09 ENCOUNTER — Encounter: Payer: Self-pay | Admitting: Internal Medicine

## 2013-02-09 ENCOUNTER — Ambulatory Visit (INDEPENDENT_AMBULATORY_CARE_PROVIDER_SITE_OTHER): Payer: Medicare Other | Admitting: Internal Medicine

## 2013-02-09 VITALS — BP 118/64 | HR 81 | Temp 97.6°F | Resp 10 | Ht 72.0 in | Wt 199.0 lb

## 2013-02-09 DIAGNOSIS — E291 Testicular hypofunction: Secondary | ICD-10-CM

## 2013-02-09 DIAGNOSIS — M810 Age-related osteoporosis without current pathological fracture: Secondary | ICD-10-CM

## 2013-02-09 DIAGNOSIS — N62 Hypertrophy of breast: Secondary | ICD-10-CM

## 2013-02-09 DIAGNOSIS — T148XXA Other injury of unspecified body region, initial encounter: Secondary | ICD-10-CM

## 2013-02-09 LAB — PSA: PSA: 0.15 ng/mL (ref 0.10–4.00)

## 2013-02-09 NOTE — Patient Instructions (Signed)
Please return in 6 months. Please discuss your prednisone taper with your neurologist. Call me when you are at 5 mg of Prednisone and ready to taper down.

## 2013-02-09 NOTE — Progress Notes (Signed)
Subjective:     Patient ID: Brandon Ray, male   DOB: 11/21/1934, 78 y.o.   MRN: [id number]  HPI Brandon Ray is a very pleasant 78 y/o man with h/o severe polyneuropathy and monoclonal gammopathy, returning for f/u for primary hypogonadism, gynecomastia, osteoporosis with sacral fracture, long-term steroid use.   I saw him 6 months ago as a referral to evaluate for endocrinopathy in ?POEMS syndrome. This is a potential diagnosis, not definitive. He does have polyneuropathy, monoclonal gammopathy, gynecomastia and primary hypogonadism, but no organomegaly or significant skin changes. He is followed by Dr. Lamonte Sakai in oncology, also Dr. Tessa Lerner in pain clinic,  Dr Trinna Post (neurology) at the [facility] at [facility]. Dr. Krista Blue is his local neurologist.  She also has osteoporosis, and is on Ca+vit D 500-200/day + vit D 2000 IU daily + vit D and Ca from a MVI daily. He is on phenobarbital and phenytoin for seizures, now reduced in dose since he did not have a seizure since 1961. We ordered a DEXA scan at last visit, and is returned as osteopenic, with L1-L4 T score of -0.9 and dual femur T score of -1.1. However he did have a sacral fracture, which qualifies him for an osteoporosis diagnosis.   The patient was found to have primary hypogonadism, with an extremely low testosterone level (free testosterone 2.1) along with high FSH and LH at last visit. Other pituitary disorders were ruled out by a normal prolactin, TSH, free T4, estradiol. His beta-hCG was normal. We started testosterone gel approx 4 mo ago. He feels more tired, unclear if from testosterone.  Patient also had gynecomastia, with no evidence of malignancy on a bilateral mammogram. This is probably due to his primary hypogonadism, with hopefully improvement after starting testosterone. He is wondering whether the testosterone could have triggered a swelling sensation in his abdomen. However, he stopped the testosterone for almost a month  in 11/2012 and he still felt the abdominal pressure.   He has been on steroids, slow taper, for his neuropathy. He is here today to discuss coming off prednisone, he is on 10 mg now, with the prospect to going to 5 mg in 3 days. He does c/o worse neuropathy in right hand and numbness/tingling on lat. Legs.   For the rest of the PMH, please see my previous office note.  Review of Systems - see above also Constitutional: no weight gain/loss, + fatigue, + subjective hyperthermia, + nocturia Eyes: + blurry vision(cataracts), no xerophthalmia ENT: no sore throat, no nodules palpated in throat, no dysphagia/odynophagia, no hoarseness Cardiovascular: no CP/SOB/palpitations/leg swelling Respiratory: no cough/SOB Gastrointestinal: no N/V/D/+ C Musculoskeletal: no muscle/joint aches.  Skin: rash on medial arms - occasional itching; easy bruising Neurological: decreased DTRs in all 4 Psychiatric: no depression/anxiety  I reviewed pt's medications, allergies, PMH, social hx, family hx and no changes required, except as mentioned above.  Objective:   Physical Exam BP 118/64  Pulse 81  Temp(Src) 97.6 F (36.4 C) (Oral)  Resp 10  Ht 6' (1.829 m)  Wt 199 lb (90.266 kg)  BMI 26.98 kg/m2  SpO2 95% Constitutional: overweight, in NAD, difficult to move to examiner's table or to stand - needs to lean on wall Eyes: PERRLA, EOMI, no exophthalmos ENT: moist mucous membranes, no thyromegaly, no cervical lymphadenopathy Cardiovascular: RRR, No MRG Respiratory: CTA B Gastrointestinal: abdomen soft, NT, ND, BS+ Musculoskeletal: Mild kyphosis. Skin: moist, warm, reticular rash on medial arms Neurological: mild tremor with outstretched hands, no DTRs detected  Assessment:     1. ?POEMS (polyneuropathy, organomegaly, endocrinopathy, monoclonal gammopathy, skin changes)  - + polyneuropathy - no organomegaly - + primary hypogonadism - + monoclonal gammopathy - + skin changes (? If his reticular rash  qualifies - also c/o sensation of sweating without really wetting his skin)  2. Primary hypogonadism - + gynecomastia bilaterally - per mammography - + testicular atrophy - started testosterone gel after last visit, has a sensation of pressure in abdomen, but this did not subside after stopping testosterone gel for 1 mo after his surgery for Transurethral resection of prostate residual growth in 12/22/2012. Restarted at the end of 12/2012.  3. Osteoporosis - h/o sacral fracture - on steroid taper now, but not sure if he can tolerate coming off - DEXA 11/06/2012: L1-L4 T score of -0.9 and dual femur T score of -1.1 - spine X-ray: no vb fx - on adequate Ca+vit D, vit D 55 6 mo ago  4. LE edema  - no other possible med culprits other than Prednisone    Plan:     1. ?POEMS - very rare sd., we discussed about the diagnosis criteria at last visit: Mandatory criteria (need both)  - Peripheral neuropathy - yes  - Monoclonal plasma cell disorder - yes Major criteria  (need one)  - Osteosclerotic bone lesions - no  - Elevated VEGF levels - VEGF <31 L pg/mL (31-86), but can be decreased by steroids; has macular degeneration and gets intraocular Avastin tx (anti-VEGF Ab) - not sure if this influenced the value...  - Castleman's disease - no  Minor criteria  (need one)  - Skin changes - he did not have this at last visit, but now has reticular rash on medial sides of arms   - Organomegaly - no  - Extravascular volume overload - yes, now with pitting edema bilateral LE (not seen at last visit)  - Hematologic features - yes, plasma cell dyscrasia in BM, also hypercellular BM can be a feature   - CNS involvement/papilledema - no  - Endocrine abnormalities - primary hypogonadism (but has h/o normal erections and testicular size, h/o normal fertility - has 4 children, r/o Klinefelter sd by karyotype analysis - see above), which is the most common endocrinopathy associated with POEMS sd.   Overall, I  believe is still very possible that he has POEMS sd. He was referred to [facility] neuro for a second opinion. His [facility] neurologist thinks this is not CIDP.  2. Gynecomastia - this is stable, no increase pain after starting testosterone  3. H/o sacral fracture - despite osteopenic DEXA scores, he has osteoporosis diagnosis due to this He is on Prednisone now and I explained that the steroids increase the risk of fx even without changes in BMD on DEXA. He is on a good regimen of Ca + vit D. Last vit D level was 55 at last visit 6 mo ago.We will keep him on the same regimen, but check a DEXA at next visit.   4. Edema: - no easily identifiable med factors, I advised him to schedule a new appt with his cardiologist  Office Visit on 02/09/2013  Component Date Value Range Status  . Testosterone 02/09/2013 224.92* 350.00 - 890.00 ng/dL Final  . PSA 02/09/2013 0.15  0.10 - 4.00 ng/mL Final  . Testosterone 02/09/2013 217.23* 300 - 890 ng/dL Final   Comment:           Tanner Stage       Male  Male                                        I              < 30 ng/dL        < 10 ng/dL                                        II             < 150 ng/dL       < 30 ng/dL                                        III            100-320 ng/dL     < 35 ng/dL                                        IV             200-970 ng/dL     15-40 ng/dL                                        V/Adult        300-890 ng/dL     10-70 ng/dL                             . Sex Hormone Binding 02/09/2013 64  13 - 71 nmol/L Final  . Testosterone, Free 02/09/2013 26.5* 47.0 - 244.0 pg/mL Final   Comment:                            The concentration of free testosterone is derived from a mathematical                          expression based on constants for the binding of testosterone to sex                          hormone-binding globulin and albumin.  . Testosterone-% Freee. 02/09/2013 1.2* 1.6 - 2.9 % Final   Not  sure why testosterone level repeated... Will increase dose to 7.5 g daily (1.5 packets). PSA is low. SHBG is very good.

## 2013-02-13 ENCOUNTER — Telehealth: Payer: Self-pay | Admitting: *Deleted

## 2013-02-13 LAB — TESTOSTERONE, FREE, TOTAL, SHBG: Testosterone: 217.23 ng/dL — ABNORMAL LOW (ref 300–890)

## 2013-02-13 NOTE — Telephone Encounter (Signed)
Pt states he has decided he does want Dr. Lamonte Sakai to refer him for second opinion at [facility].

## 2013-02-14 ENCOUNTER — Other Ambulatory Visit: Payer: Self-pay | Admitting: Oncology

## 2013-02-14 DIAGNOSIS — D472 Monoclonal gammopathy: Secondary | ICD-10-CM

## 2013-02-14 MED ORDER — TESTOSTERONE 50 MG/5GM (1%) TD GEL: 7.5000 g | Freq: Every day | TRANSDERMAL | Status: DC

## 2013-02-16 ENCOUNTER — Other Ambulatory Visit: Payer: Self-pay

## 2013-02-16 ENCOUNTER — Telehealth: Payer: Self-pay | Admitting: Oncology

## 2013-02-16 MED ORDER — ATORVASTATIN CALCIUM 20 MG PO TABS: 40.0000 mg | ORAL_TABLET | Freq: Every day | ORAL | Status: DC

## 2013-02-16 NOTE — Telephone Encounter (Signed)
Pt appt. With Dr.Tuchman @ [facility] is 03/13/13$RemoveBefore'@11'ONiXoYZUlRqig$ :00. Medical records faxed,slides and scans will be fedex'ed. Pt is aware

## 2013-02-16 NOTE — Telephone Encounter (Signed)
Patient Instructions  Continue your current medication. I will see you again in 6 months. Ordered Medications  Disp Refills Start End  atorvastatin (LIPITOR) 40 MG tablet (Discontinued) 90 tablet 3 10/15/2011 06/07/2012  Take 1 tablet (40 mg total) by mouth daily. - Oral  Reason for Discontinue: Dose change

## 2013-02-18 ENCOUNTER — Encounter: Payer: Self-pay | Admitting: Internal Medicine

## 2013-02-19 ENCOUNTER — Telehealth: Payer: Self-pay | Admitting: *Deleted

## 2013-02-19 NOTE — Telephone Encounter (Signed)
Thank you Larene Beach!

## 2013-02-19 NOTE — Telephone Encounter (Signed)
Notes faxed to Dr Trinna Post at [phone] for pt.

## 2013-02-20 ENCOUNTER — Ambulatory Visit: Payer: Self-pay | Admitting: Neurology

## 2013-02-21 ENCOUNTER — Telehealth: Payer: Self-pay | Admitting: *Deleted

## 2013-02-21 NOTE — Telephone Encounter (Signed)
Pt called about his referral to [facility], questioning whether it was supposed to be to Neurology instead of Hematology.  Per Dr. Lamonte Sakai, his referral is to Hematology and pt may ask his Neurologist for referral to Neurologist at [facility] if he wants one.  Pt verbalized understanding.

## 2013-02-27 ENCOUNTER — Telehealth: Payer: Self-pay | Admitting: *Deleted

## 2013-02-27 NOTE — Telephone Encounter (Signed)
Notes faxed to [place]. Amalia Hailey at [facility] [phone] for pt.

## 2013-03-05 ENCOUNTER — Encounter: Payer: Self-pay | Admitting: Internal Medicine

## 2013-03-08 ENCOUNTER — Telehealth: Payer: Self-pay | Admitting: *Deleted

## 2013-03-08 NOTE — Telephone Encounter (Signed)
Lm informing the pt that we are closed on 05/21/13. gv appt d/t for 05/22/13@ 8:30am. i also made the pt aware that i will mail a letter/cal as well...td

## 2013-03-13 DIAGNOSIS — G629 Polyneuropathy, unspecified: Secondary | ICD-10-CM | POA: Insufficient documentation

## 2013-03-16 ENCOUNTER — Encounter: Payer: Self-pay | Admitting: Internal Medicine

## 2013-03-16 ENCOUNTER — Ambulatory Visit (INDEPENDENT_AMBULATORY_CARE_PROVIDER_SITE_OTHER): Payer: Medicare Other | Admitting: Internal Medicine

## 2013-03-16 VITALS — BP 118/64 | HR 74 | Temp 98.8°F | Resp 10 | Ht 72.0 in | Wt 196.0 lb

## 2013-03-16 DIAGNOSIS — E291 Testicular hypofunction: Secondary | ICD-10-CM

## 2013-03-16 DIAGNOSIS — IMO0002 Reserved for concepts with insufficient information to code with codable children: Secondary | ICD-10-CM

## 2013-03-16 MED ORDER — TESTOSTERONE 50 MG/5GM (1%) TD GEL: 5.0000 g | Freq: Every day | TRANSDERMAL | Status: DC

## 2013-03-16 MED ORDER — HYDROCORTISONE 10 MG PO TABS: ORAL_TABLET | ORAL | Status: DC

## 2013-03-16 NOTE — Patient Instructions (Addendum)
Please stop prednisone tomorrow and start Hydrocortisone 10 mg in am and 5 mg in pm, no later than 6 pm. Stay on this dose for 5 weeks, then decrease to only 10 mg in am for another 5 weeks, then decrease to 5 mg in am for another 5 weeks. Toward the end of this period, please let me know if you are ready to stop the hydrocortisone as we would need to have you return to the clinic at 8 AM for a cosyntropin stimulation test.  If you cannot keep anything down (vomiting, diarrhea), you will need to go to the closest urgent care or emergency room to get hydrocortisone in the vein.  If you have fever, double the dose of your Hydrocortisone for the duration of the fever.  Please try to get a MedAlert bracelet or pendant or at least a card in your wallet mentioning: "Adrenal Insufficiency"  Please return for regular appointment in 6 months.  You can decrease the dose of testosterone to the original dose of 5 g applied daily.

## 2013-03-16 NOTE — Progress Notes (Signed)
Subjective:     Patient ID: Brandon Ray, male   DOB: 1935/06/15, 78 y.o.   MRN: [id number]  HPI Brandon Ray is a very pleasant 78 y/o man with h/o severe polyneuropathy and monoclonal gammopathy, returning for f/u for primary hypogonadism and long-term steroid use.   He was previously investigated for POEMS sd. and recently had an evaluation by Dr. Terrial Rhodes A. Tuchman (Heme/Onc at [facility]) for this, but it was concluded that he actually has MGUS and will be just followed for it. He also had a fat pad biopsy to rule out amyloidosis and this was negative. He still has several labs pending.  Please see my previous notes regarding patient's past medical history.He does have polyneuropathy, monoclonal gammopathy, gynecomastia and primary hypogonadism, but no organomegaly or significant skin changes. He is followed by Dr. Lamonte Sakai in oncology - will continue to see Dr. Amalia Hailey as Dr. Lamonte Sakai is leaving the practice, also Dr. Tessa Lerner in pain clinic,  Dr Trinna Post (neurology) at the [facility]. Dr. Krista Blue is his local neurologist. He believes he will be seen at [facility] by neurology, too.  The patient was found to have primary hypogonadism, with an extremely low testosterone level (free testosterone 2.1) along with high FSH and LH at last visit. We started testosterone gel at 5 grams daily, however, on this treatment, his free testosterone increased only to 26.5, so I suggested that he increase the dose to 7.5 g daily, which he did approximately a month and a half ago. At this visit, he tells me that his libido has certainly increased, however he does feel tension in his pelvis (he had this before) and also feels that his breasts are more tense. Of note, he stopped the testosterone for almost a month in 11/2012 and he still felt the abdominal pressure.   Patient also had gynecomastia, with no evidence of malignancy on a bilateral mammogram. This is probably due to his primary hypogonadism, but he does not feel much improved  after starting testosterone.   Patient has been using high-dose steroids for his neuropathy. He was initially started at 60 mg in November of last year, and this dose was tapered down to a dose of 5 mg of prednisone now decreased from 10 mg since 06/17 - tolerating it well. I did ask him to schedule a return appointment with me when he gets to 5 mg and ready to taper down, as we need to swich to hydrocortisone to facilitate the taper. This is mainly the reason why patient returns today.  Review of Systems - see above also Constitutional: no weight gain/loss, no fatigue, no subjective hyperthermia, + nocturia Eyes: + blurry vision(cataracts), no xerophthalmia ENT: no sore throat, no nodules palpated in throat, no dysphagia/odynophagia, no hoarseness Cardiovascular: no CP/SOB/palpitations/leg swelling Respiratory: no cough/SOB Gastrointestinal: no N/V/D/C Musculoskeletal: no muscle/joint aches.  Skin: no rashes; + easy bruising enlarged breasts   I reviewed pt's medications, allergies, PMH, social hx, family hx and no changes required, except as mentioned above.  Objective:   Physical Exam BP 118/64  Pulse 74  Temp(Src) 98.8 F (37.1 C) (Oral)  Resp 10  Ht 6' (1.829 m)  Wt 196 lb (88.905 kg)  BMI 26.58 kg/m2  SpO2 97% Wt Readings from Last 3 Encounters:  03/16/13 196 lb (88.905 kg)  02/09/13 199 lb (90.266 kg)  01/29/13 194 lb 11.2 oz (88.315 kg)   Constitutional: overweight, in NAD, difficult to move to examiner's table or to stand - needs to lean on  wall Eyes: PERRLA, EOMI, no exophthalmos ENT: moist mucous membranes, no thyromegaly, no cervical lymphadenopathy Cardiovascular: RRR, No MRG Respiratory: CTA B Gastrointestinal: abdomen soft, NT, ND, BS+ Musculoskeletal: kyphosis. Skin: moist, warm, reticular rash on medial arms Neurological: mild tremor with outstretched hands, no DTRs detected  Assessment:     1. Polyneuropathy - unlikely POEMS syndrome per evaluation by  Hem/Onc at [facility] - amyloidosis ruled out by negative fat pad biopsy -  VEGF level pending - managed by Dr. Trinna Post at [facility] and Dr. Krista Blue here in [place]  2. MGUS - monoclonal gammopathy - now managed by [place], previously Dr. Lamonte Sakai  3. Primary hypogonadism - + gynecomastia bilaterally - per mammography - + testicular atrophy - started testosterone gel after last visit, has a sensation of pressure in abdomen, but this did not subside after stopping testosterone gel for 1 mo after his surgery for Transurethral resection of prostate residual growth in 12/22/2012. Restarted at the end of 12/2012. - free Testosterone increased from 2.1 to 26.5 on 5 g  Androgel daily - again increased tension in lower abdomen especially after increasing the dose of testosterone to 7.5 g daily at the end of 01/2013  3. Osteoporosis - h/o sacral fracture - on steroid taper now, but not sure if he can tolerate coming off - DEXA 11/06/2012: L1-L4 T score of -0.9 and dual femur T score of -1.1 - spine X-ray: no vb fx - on adequate Ca+vit D, vit D 55 6 mo ago  4. Long-term use of steroids - previously on prednisone 60 mg starting at the end of last year - Was able to taper to 5 mg of prednisone daily     Plan:     1. Primary hypogonadism - Patient increase the dose of his testosterone from 5 to 7.5 g daily a month ago when he again developed pelvic pressure. He had this before after starting the lower dose testosterone, however this did not result he came off testosterone for a month for his TURP. No change in urination pattern, which remains "gravity-driven" as he mentions.  - since he has both increased pelvic pressure and increased tension in his breasts, I think we can definitely decrease the dose back to 5 g and continue him on this and we might be able to increase it as tolerated in the future. I think he already had a great improvement in his testosterone even though this is lower than the  lower limit of normal.  - he asked me whether I believe that his pelvic attention can be from any blockage, but this is difficult for me to tell - I advised him to check with his urologist - I gave him a new prescription for testosterone at the lower dose of 5 g daily.  2. Gynecomastia - feels more tension in breasts after increasing testosterone - would decrease the dose back to 5 g daily and will see how he feels  3. Osteoporosis - We did not address this at this visit  4. Long-term use of steroids - The patient could tolerate a very slow taper his prednisone, and after staying at 10 mg daily for about 5 weeks, he decreased the dose to 5 mg daily for the last 10 days. - I explained the reasons why will need to switch from prednisone to hydrocortisone to facilitate the taper:  Easier to administer progressively lower doses as Prednisone 5 mg = Hydrocortisone 20 mg  Shorter half life of HC  c/w Prednisone, which allows the hypothalamic-pituitary-adrenal axis to recover between doses - I suggested that we start with 10 mg of hydrocortisone in the morning and 5 mg in the afternoon, no later than 6 PM. This is a slightly lower dose than the equivalent of 5 mg of prednisone, but I advised him to let me know if he develops increased neuropathy symptoms for symptoms related to steroid insufficiency. In that case, we can always increase to 15 mg in a.m. and 5 in the afternoon, for example. - if he can tolerate this dose well, we will use decrements of 5 mg every 5 weeks, until we get to 5 mg of hydrocortisone daily, after which he will present for a cosyntropin stimulation test.  - I also gave him the following instructions: If you cannot keep anything down (vomiting, diarrhea), you will need to go to the closest urgent care or emergency room to get hydrocortisone in the vein.  If you have fever, double the dose of your Hydrocortisone for the duration of the fever.  Please try to get a MedAlert bracelet  or pendant or at least a card in your wallet mentioning: "Adrenal Insufficiency" - she will return for regular appointment in 6 months but I will keep in touch with him through my chart

## 2013-04-06 ENCOUNTER — Encounter: Payer: Self-pay | Admitting: Internal Medicine

## 2013-04-10 ENCOUNTER — Other Ambulatory Visit: Payer: Self-pay | Admitting: Internal Medicine

## 2013-04-10 MED ORDER — HYDROCORTISONE 5 MG PO TABS: ORAL_TABLET | ORAL | Status: DC

## 2013-04-25 ENCOUNTER — Other Ambulatory Visit: Payer: Self-pay

## 2013-05-04 ENCOUNTER — Encounter: Payer: Self-pay | Admitting: Internal Medicine

## 2013-05-08 ENCOUNTER — Encounter (HOSPITAL_COMMUNITY): Payer: Self-pay | Admitting: *Deleted

## 2013-05-08 ENCOUNTER — Emergency Department (HOSPITAL_COMMUNITY)
Admission: EM | Admit: 2013-05-08 | Discharge: 2013-05-08 | Disposition: A | Discharge status: Home / Self Care | Payer: Medicare Other | Attending: Emergency Medicine | Admitting: Emergency Medicine

## 2013-05-08 ENCOUNTER — Emergency Department (HOSPITAL_COMMUNITY): Payer: Medicare Other

## 2013-05-08 DIAGNOSIS — Y9301 Activity, walking, marching and hiking: Secondary | ICD-10-CM | POA: Insufficient documentation

## 2013-05-08 DIAGNOSIS — Z7982 Long term (current) use of aspirin: Secondary | ICD-10-CM | POA: Insufficient documentation

## 2013-05-08 DIAGNOSIS — S92919B Unspecified fracture of unspecified toe(s), initial encounter for open fracture: Secondary | ICD-10-CM | POA: Insufficient documentation

## 2013-05-08 DIAGNOSIS — S92911B Unspecified fracture of right toe(s), initial encounter for open fracture: Secondary | ICD-10-CM

## 2013-05-08 DIAGNOSIS — Z87891 Personal history of nicotine dependence: Secondary | ICD-10-CM | POA: Insufficient documentation

## 2013-05-08 DIAGNOSIS — G40909 Epilepsy, unspecified, not intractable, without status epilepticus: Secondary | ICD-10-CM | POA: Insufficient documentation

## 2013-05-08 DIAGNOSIS — Y92009 Unspecified place in unspecified non-institutional (private) residence as the place of occurrence of the external cause: Secondary | ICD-10-CM | POA: Insufficient documentation

## 2013-05-08 DIAGNOSIS — Z8669 Personal history of other diseases of the nervous system and sense organs: Secondary | ICD-10-CM | POA: Insufficient documentation

## 2013-05-08 DIAGNOSIS — E785 Hyperlipidemia, unspecified: Secondary | ICD-10-CM | POA: Insufficient documentation

## 2013-05-08 DIAGNOSIS — Z8709 Personal history of other diseases of the respiratory system: Secondary | ICD-10-CM | POA: Insufficient documentation

## 2013-05-08 DIAGNOSIS — I2510 Atherosclerotic heart disease of native coronary artery without angina pectoris: Secondary | ICD-10-CM | POA: Insufficient documentation

## 2013-05-08 DIAGNOSIS — Z79899 Other long term (current) drug therapy: Secondary | ICD-10-CM | POA: Insufficient documentation

## 2013-05-08 DIAGNOSIS — Z9889 Other specified postprocedural states: Secondary | ICD-10-CM | POA: Insufficient documentation

## 2013-05-08 DIAGNOSIS — IMO0002 Reserved for concepts with insufficient information to code with codable children: Secondary | ICD-10-CM | POA: Insufficient documentation

## 2013-05-08 DIAGNOSIS — Z951 Presence of aortocoronary bypass graft: Secondary | ICD-10-CM | POA: Insufficient documentation

## 2013-05-08 DIAGNOSIS — Z8719 Personal history of other diseases of the digestive system: Secondary | ICD-10-CM | POA: Insufficient documentation

## 2013-05-08 DIAGNOSIS — W010XXA Fall on same level from slipping, tripping and stumbling without subsequent striking against object, initial encounter: Secondary | ICD-10-CM | POA: Insufficient documentation

## 2013-05-08 DIAGNOSIS — Z87448 Personal history of other diseases of urinary system: Secondary | ICD-10-CM | POA: Insufficient documentation

## 2013-05-08 MED ORDER — LIDOCAINE HCL (PF) 1 % IJ SOLN
30.0000 mL | Freq: Once | INTRAMUSCULAR | Status: AC
  Administered 2013-05-08: 30 mL via INTRADERMAL
  Filled 2013-05-08: qty 30

## 2013-05-08 MED ORDER — CEFAZOLIN SODIUM 1-5 GM-% IV SOLN
1.0000 g | Freq: Once | INTRAVENOUS | Status: AC
  Administered 2013-05-08: 1 g via INTRAVENOUS
  Filled 2013-05-08: qty 50

## 2013-05-08 MED ORDER — CEPHALEXIN 500 MG PO CAPS: 500.0000 mg | ORAL_CAPSULE | Freq: Four times a day (QID) | ORAL | Status: DC

## 2013-05-08 MED ORDER — HYDROCODONE-ACETAMINOPHEN 5-325 MG PO TABS: 1.0000 | ORAL_TABLET | ORAL | Status: DC | PRN

## 2013-05-08 MED ORDER — MORPHINE SULFATE 4 MG/ML IJ SOLN
4.0000 mg | Freq: Once | INTRAMUSCULAR | Status: AC
  Administered 2013-05-08: 4 mg via INTRAVENOUS
  Filled 2013-05-08: qty 1

## 2013-05-08 NOTE — ED Notes (Signed)
Pt was source for blood exposure to staff member.  Per protocol, blood drawn from pt for [facility] labs and rapid HIV test.

## 2013-05-08 NOTE — ED Notes (Signed)
Jeneen Rinks, RN night shift informed this RN that Dr. Doy Mince requested that pt's foot be loosely wrapped with gauze to protect foot dressing.

## 2013-05-08 NOTE — ED Provider Notes (Signed)
CSN: [id number]     Arrival date & time 05/08/13  2992 History     First MD Initiated Contact with Patient 05/08/13 0532     Chief Complaint  Patient presents with  . Fall   (Consider location/radiation/quality/duration/timing/severity/associated sxs/prior Treatment) HPI Comments: 78 yo male with hx of neuropathy and foot drop who fell this morning while going to the bathroom.  He struck his right great toe on the floor.  He denies any other injuries or pain.    Patient is a 78 y.o. male presenting with fall.  Fall This is a new problem. The current episode started less than 1 hour ago. Episode frequency: once. Pertinent negatives include no chest pain, no abdominal pain, no headaches and no shortness of breath. Exacerbated by: movement of toe. Nothing relieves the symptoms. He has tried nothing for the symptoms.    Past Medical History  Diagnosis Date  . Coronary artery disease 01/2003    CABG  . Hyperlipidemia   . Weakness 2013-May began    lower extemities  . Pleural effusion 05/2011    from abdominal surgery  . Small bowel obstruction 05/2011    due to adhesion  . Free monoclonal light chain     urine  . Peripheral neuropathy 05/2011     follow up with Dr. Tish Frederickson multiple issues  . Seizures 1950's    last one 1960  . BPH (benign prostatic hypertrophy) 2014   Past Surgical History  Procedure Laterality Date  . Cardiac catheterization  01/27/2003    NORMAL LEFT VENTRICULAR SIZE WITH MILD FOCAL LATERAL WALL HYPOKINESIA. EF 60%  . Coronary artery bypass graft  2004    LIMA GRAFT TO THE LAD, SAPHENOUS VEIN GRAFT TO THE DIAGONAL, SAPHENOUS VEIN GRAFT TO THE FIRST OM, SAPHENOUS VEIN GRAFT TO THE PDA  . Transurethral resection of prostate  1992  . Hand surgery  2000    left  . Abdominal surgery  05/2011    bowel obstruction  . Ankle fracture surgery  1997    left  . Transurethral resection of prostate N/A 12/22/2012    Procedure: TRANSURETHRAL RESECTION OF THE PROSTATE WITH  GYRUS (TURP);  Surgeon: Fredricka Bonine, MD;  Location: [facility];  Service: Urology;  Laterality: N/A;   Family History  Problem Relation Age of Onset  . Asthma Mother   . Heart disease Mother 71  . Coronary artery disease Father   . Heart disease Father   . Cancer Sister 50    breast   History  Substance Use Topics  . Smoking status: Former Smoker -- 1.00 packs/day for 5 years    Types: Cigarettes    Quit date: 07/01/1965  . Smokeless tobacco: Never Used  . Alcohol Use: No    Review of Systems  Respiratory: Negative for shortness of breath.   Cardiovascular: Negative for chest pain.  Gastrointestinal: Negative for abdominal pain.  Neurological: Negative for headaches.  All other systems reviewed and are negative.    Allergies  Boostrix; Other; and Amoxicillin  Home Medications   Current Outpatient Rx  Name  Route  Sig  Dispense  Refill  . aspirin 81 MG tablet   Oral   Take 81 mg by mouth daily.          Marland Kitchen atorvastatin (LIPITOR) 20 MG tablet   Oral   Take 2 tablets (40 mg total) by mouth daily.   90 tablet   0     Call and  schedule a follow up office visit to rece ...   . calcium-vitamin D (OSCAL WITH D) 500-200 MG-UNIT per tablet   Oral   Take 1 tablet by mouth daily.           . Cholecalciferol 2000 UNITS TBDP   Oral   Take 4,000 Units by mouth daily.         . Coenzyme Q10 (CO Q-10 PO)   Oral   Take 1 tablet by mouth daily.         . Multiple Vitamin (MULTIVITAMIN WITH MINERALS) TABS tablet   Oral   Take 1 tablet by mouth daily.         . Omega-3 Fatty Acids (FISH OIL) 1200 MG CAPS   Oral   Take 1,200 mg by mouth daily.         Marland Kitchen PHENobarbital (LUMINAL) 97.2 MG tablet   Oral   Take 97.2 mg by mouth daily.           . phenytoin (DILANTIN) 100 MG ER capsule   Oral   Take 100 mg by mouth daily.          . polyethylene glycol (MIRALAX / GLYCOLAX) packet   Oral   Take 17 g by mouth daily.          . predniSONE (DELTASONE) 5 MG tablet   Oral   Take 7.5 mg by mouth daily.         . tamsulosin (FLOMAX) 0.4 MG CAPS   Oral   Take 0.4 mg by mouth daily.         Marland Kitchen testosterone (ANDROGEL) 50 MG/5GM GEL   Transdermal   Place 5 g onto the skin daily.   90 Package   4    BP 109/58  Pulse 71  Temp(Src) 97.8 F (36.6 C) (Oral)  Resp 16  SpO2 97% Physical Exam  Nursing note and vitals reviewed. Constitutional: He is oriented to person, place, and time. He appears well-developed and well-nourished. No distress.  HENT:  Head: Normocephalic and atraumatic. Head is without raccoon's eyes and without Battle's sign.  Nose: Nose normal.  Eyes: Conjunctivae and EOM are normal. Pupils are equal, round, and reactive to light. No scleral icterus.  Neck: No spinous process tenderness and no muscular tenderness present.  Cardiovascular: Normal rate, regular rhythm, normal heart sounds and intact distal pulses.   No murmur heard. Pulmonary/Chest: Effort normal and breath sounds normal. He has no rales. He exhibits no tenderness.  Abdominal: Soft. There is no tenderness. There is no rebound and no guarding.  Musculoskeletal: Normal range of motion. He exhibits no edema and no tenderness.       Thoracic back: He exhibits no tenderness and no bony tenderness.       Lumbar back: He exhibits no tenderness and no bony tenderness.       Right foot: He exhibits deformity and laceration (laceration through dorsal surface of right great toe with bony deformity palpable and visible. ).  No evidence of trauma to extremities, except as noted.  2+ distal pulses.    Neurological: He is alert and oriented to person, place, and time.  Skin: Skin is warm and dry. No rash noted.  Psychiatric: He has a normal mood and affect.    ED Course   Procedures (including critical care time)  Labs Reviewed - No data to display Dg Toe Great Right  05/08/2013   *RADIOLOGY REPORT*  Clinical Data: Fall  RIGHT GREAT  TOE  Comparison: None.  Findings: Imaging material overlies the great toe.  There is an acute mildly displaced anterior fracture traversing the distal right first phalanx with slight lateral angulation. No Intra- articular extension appreciated.  There is associated soft tissue swelling.  No radiopaque foreign body.  Diffuse osteopenia is present.  IMPRESSION: 1.  Acute mildly placed transverse fracture through the right first distal phalanx. 2.  Diffuse osteopenia.   Original Report Authenticated By: Jeannine Boga, M.D.  All radiology studies independently viewed by me.    1. Fracture, toe, right, open, initial encounter     MDM  78 yo male fell, injuring his right great toe.  Open deformity visualized and confirmed with plain films.  Ancef given.  Pt declined tdap due to concern that it caused his neuropathy.  He also thinks he has had a tdap within the past five years.  Ortho consulted.    Houston Siren, MD 05/08/13 [phone]

## 2013-05-08 NOTE — ED Notes (Signed)
Consent obtained for Dr Doran Durand.  Supplies at bedside.

## 2013-05-08 NOTE — ED Notes (Signed)
Pt states that he fell this morning just before arrival to E.D. Pt states that he has drop foot from neuropathy and his right great toe is bleeding from tripping him up. Pt denies dizziness, blacking out or passing out. Pt alert and oriented currently only pain in right great toe. Bandage applied and bleeding controlled.

## 2013-05-08 NOTE — Consult Note (Addendum)
Reason for Consult:  Open right hallux fracture Referring Physician:  Dr. Nelva Bush Brandon Ray is an 78 y.o. male.  HPI: 78 y/o male with PMH of idiopathic peripheral neurpathy and a right foot drop subbed his toe this morning at 0400 when walking without his brace on.  His wife noted gross deformity and bleeding of the wound.  She brought him to the ER.  He c/o pain in the right hallux that is worse with any motion.  Feels better with rest and holding still.  No h/o diabetes.  Pt is not a smoker.  He has no h/o injury to the hallux in the past.  Past Medical History  Diagnosis Date  . Coronary artery disease 01/2003    CABG  . Hyperlipidemia   . Weakness 2013-May began    lower extemities  . Pleural effusion 05/2011    from abdominal surgery  . Small bowel obstruction 05/2011    due to adhesion  . Free monoclonal light chain     urine  . Peripheral neuropathy 05/2011     follow up with Dr. Tish Frederickson multiple issues  . Seizures 1950's    last one 1960  . BPH (benign prostatic hypertrophy) 2014    Past Surgical History  Procedure Laterality Date  . Cardiac catheterization  01/27/2003    NORMAL LEFT VENTRICULAR SIZE WITH MILD FOCAL LATERAL WALL HYPOKINESIA. EF 60%  . Coronary artery bypass graft  2004    LIMA GRAFT TO THE LAD, SAPHENOUS VEIN GRAFT TO THE DIAGONAL, SAPHENOUS VEIN GRAFT TO THE FIRST OM, SAPHENOUS VEIN GRAFT TO THE PDA  . Transurethral resection of prostate  1992  . Hand surgery  2000    left  . Abdominal surgery  05/2011    bowel obstruction  . Ankle fracture surgery  1997    left  . Transurethral resection of prostate N/A 12/22/2012    Procedure: TRANSURETHRAL RESECTION OF THE PROSTATE WITH GYRUS (TURP);  Surgeon: Fredricka Bonine, MD;  Location: [facility];  Service: Urology;  Laterality: N/A;    Family History  Problem Relation Age of Onset  . Asthma Mother   . Heart disease Mother 34  . Coronary artery disease Father   . Heart disease  Father   . Cancer Sister 103    breast    Social History:  reports that he quit smoking about 47 years ago. His smoking use included Cigarettes. He has a 5 pack-year smoking history. He has never used smokeless tobacco. He reports that he does not drink alcohol or use illicit drugs.  Allergies:  Allergies  Allergen Reactions  . Boostrix [Tetanus-Diphth-Acell Pertussis] Other (See Comments)    Neuropathy symptoms  . Other Other (See Comments)    Flu shot-neuropathy symptoms  . Amoxicillin Rash    Upper torso only.    Medications: I have reviewed the patient's current medications.  No results found for this or any previous visit (from the past 48 hour(s)).  Dg Toe Great Right  05/08/2013   *RADIOLOGY REPORT*  Clinical Data: Fall  RIGHT GREAT TOE  Comparison: None.  Findings: Imaging material overlies the great toe.  There is an acute mildly displaced anterior fracture traversing the distal right first phalanx with slight lateral angulation. No Intra- articular extension appreciated.  There is associated soft tissue swelling.  No radiopaque foreign body.  Diffuse osteopenia is present.  IMPRESSION: 1.  Acute mildly placed transverse fracture through the right first distal phalanx. 2.  Diffuse osteopenia.   Original Report Authenticated By: Jeannine Boga, M.D.    ROS:  No recent f/c/nv//wt lss PE:  Blood pressure 133/57, pulse 60, temperature 97.5 F (36.4 C), temperature source Oral, resp. rate 16, SpO2 95.00%. wn wd male in nad.  A and O x 4.  Mood and affect normal.  EOMI.  resp unlabored.  R hallux with transverse laceration about 3 cm across the dorsal proximal nail bed.  The nail is displaced distally.  No bone immediately evident, but there is gross deformity of the toe with apex dorsal angulation.  2+ dp and pt pulses.  Feels pressure at the wound.  3/5 strength in DF at the ankle.  N olymphadenopathy.  Skin o/w intact and healthy.  Assessment/Plan: Right hallux open  fracture - I explained the nature of the injury to the pt and his wife in detail.  I propose I and D of the open fracture in the ER with reduction of the fracture.  He'll f/u with me in the office in a few days and may need to go back to the OR for repeat I and D and possibly pinning the fracture  The risks and benefits of the alternative treatment options have been discussed in detail.  The patient wishes to proceed with surgery and specifically understands risks of bleeding, infection, nerve damage, blood clots, need for additional surgery, amputation and death.   Wylene Simmer June 07, 2013, 8:55 AM     PROCEDURE:  After written informed consent and sterile prep, I performed a digital block of the right hallux with 1% lidocaine.  Once adequate anesthesia was achieved, I applied a penrose drain as a digital tourniquet.  I removed the nail plate in it's entirety to allow visualization of the nail bed.  There were lacerations from the corners of the eponychial fold 1 cm medially and 1 cm laterally.  The skin of the proximal nail fold was lodged in the fracture and was removed with a hemostat.  This allowed the fracture to be reduced.  I then debrided the wound circumferentially from the level of the skin down to the level of the bone removing all devitalized tissue with a scalpel.  I then irrigated the open fracture vigorously with 2 L of normal saline. The nail bed and adjacent lacerations were then repaired with horizontal mattress sutures of 3-0 monocryl.  Dry dressings were removed and the tourniquet was removed after 20 minutes.  The toe was noted to be well perfused after removal of the tourniquet.  Follow Up PLan:  He'll be WBAT on the foot in a hard sole post op shoe with his AFO.  He'll leave the dressings in place and follow up with me Friday in clinic for a wound check.  He'll be discharged on keflex and oral pain meds.

## 2013-05-10 ENCOUNTER — Telehealth: Payer: Self-pay | Admitting: Hematology and Oncology

## 2013-05-10 NOTE — Telephone Encounter (Signed)
Pt called to cx appts for August and September and does not wish to r/s.

## 2013-05-14 ENCOUNTER — Telehealth: Payer: Self-pay | Admitting: Cardiology

## 2013-05-14 ENCOUNTER — Other Ambulatory Visit: Payer: Medicare Other

## 2013-05-14 NOTE — Telephone Encounter (Signed)
Returned call to [facility] at [place] office, never received fax for surgical clearance on Friday 05/11/13.Judeen Hammans was told patient has not seen Dr.Jordan since 07/30/10.Patient needs appointment with Dr.Jordan.She stated patient has a open toe fracture and will need surgery tomorrow 05/15/13.Stated she will check with Dr.Hewitt on what to do.

## 2013-05-14 NOTE — Telephone Encounter (Signed)
Dr hewitt's office is checking on status of surgical clearance requested on Friday, pt procedure to fix a fracture is tomorrow am

## 2013-05-21 ENCOUNTER — Telehealth: Payer: Self-pay | Admitting: Cardiology

## 2013-05-21 ENCOUNTER — Ambulatory Visit: Payer: Medicare Other | Admitting: Oncology

## 2013-05-21 NOTE — Telephone Encounter (Signed)
Documentation received from [place]: Patient is cleared for surgery [place]. [place]

## 2013-05-22 ENCOUNTER — Ambulatory Visit: Payer: Medicare Other

## 2013-05-22 ENCOUNTER — Encounter: Payer: Self-pay | Admitting: Internal Medicine

## 2013-05-22 NOTE — Telephone Encounter (Signed)
Patient was called, after looking in patient's chart past due for appointment with Dr.Jordan.Appointment scheduled with Dr.Jordan 07/11/13 at 9:15 am.Patient stated his toe surgery went well and he was doing good.

## 2013-05-28 ENCOUNTER — Encounter: Payer: Self-pay | Admitting: Neurology

## 2013-07-11 ENCOUNTER — Encounter: Payer: Self-pay | Admitting: Cardiology

## 2013-07-11 ENCOUNTER — Ambulatory Visit (INDEPENDENT_AMBULATORY_CARE_PROVIDER_SITE_OTHER): Payer: Medicare Other | Admitting: Cardiology

## 2013-07-11 VITALS — BP 128/60 | HR 77 | Ht 72.0 in | Wt 188.0 lb

## 2013-07-11 DIAGNOSIS — I2510 Atherosclerotic heart disease of native coronary artery without angina pectoris: Secondary | ICD-10-CM

## 2013-07-11 DIAGNOSIS — E785 Hyperlipidemia, unspecified: Secondary | ICD-10-CM

## 2013-07-11 MED ORDER — ATORVASTATIN CALCIUM 20 MG PO TABS: 20.0000 mg | ORAL_TABLET | Freq: Every day | ORAL | Status: DC

## 2013-07-11 NOTE — Patient Instructions (Signed)
We will schedule you for fasting lab work  I will see you in one year.

## 2013-07-11 NOTE — Progress Notes (Signed)
Brandon Ray Date of Birth: 07/23/1935   History of Present Illness: Brandon Ray is seen today for followup. He has a history of coronary disease and is status post CABG in 2007. He reports that over the last year he has had progressive neuropathy associated with foot drop. He recently fractured his right great toe and required surgery. He has had extensive evaluation of his neuropathy including at [facility] and at the [facility] clinic. These records for all available through Care everywhere. He reports that the allergy of his neuropathy was felt to be related to vaccinations. This has significantly limited his activity. He is no longer able to hike. He really denies any significant cardiac complaints. He denies chest pain, shortness of breath, or palpitations. He has no edema. He is currently tapering off of steroids.  Current Outpatient Prescriptions on File Prior to Visit  Medication Sig Dispense Refill  . aspirin 81 MG tablet Take 81 mg by mouth daily.       . calcium-vitamin D (OSCAL WITH D) 500-200 MG-UNIT per tablet Take 1 tablet by mouth daily.        . Cholecalciferol 2000 UNITS TBDP Take 4,000 Units by mouth daily.      . Coenzyme Q10 (CO Q-10 PO) Take 1 tablet by mouth daily.      . Multiple Vitamin (MULTIVITAMIN WITH MINERALS) TABS tablet Take 1 tablet by mouth daily.      . Omega-3 Fatty Acids (FISH OIL) 1200 MG CAPS Take 1,200 mg by mouth daily.      Marland Kitchen PHENobarbital (LUMINAL) 97.2 MG tablet Take 97.2 mg by mouth daily.        . phenytoin (DILANTIN) 100 MG ER capsule Take 100 mg by mouth daily.       . polyethylene glycol (MIRALAX / GLYCOLAX) packet Take 17 g by mouth daily.      . predniSONE (DELTASONE) 5 MG tablet Take 7.5 mg by mouth daily.      Marland Kitchen testosterone (ANDROGEL) 50 MG/5GM GEL Place 5 g onto the skin daily.  90 Package  4   No current facility-administered medications on file prior to visit.    Allergies  Allergen Reactions  . Boostrix [Tetanus-Diphth-Acell  Pertussis] Other (See Comments)    Neuropathy symptoms  . Other Other (See Comments)    Flu shot-neuropathy symptoms  . Amoxicillin Rash    Upper torso only.    Past Medical History  Diagnosis Date  . Coronary artery disease 01/2003    CABG  . Hyperlipidemia   . Weakness 2013-May began    lower extemities  . Pleural effusion 05/2011    from abdominal surgery  . Small bowel obstruction 05/2011    due to adhesion  . Free monoclonal light chain     urine  . Peripheral neuropathy 05/2011     follow up with Dr. Tish Frederickson multiple issues  . Seizures 1950's    last one 1960  . BPH (benign prostatic hypertrophy) 2014    Past Surgical History  Procedure Laterality Date  . Cardiac catheterization  01/27/2003    NORMAL LEFT VENTRICULAR SIZE WITH MILD FOCAL LATERAL WALL HYPOKINESIA. EF 60%  . Coronary artery bypass graft  2004    LIMA GRAFT TO THE LAD, SAPHENOUS VEIN GRAFT TO THE DIAGONAL, SAPHENOUS VEIN GRAFT TO THE FIRST OM, SAPHENOUS VEIN GRAFT TO THE PDA  . Transurethral resection of prostate  1992  . Hand surgery  2000    left  . Abdominal surgery  05/2011    bowel obstruction  . Ankle fracture surgery  1997    left  . Transurethral resection of prostate N/A 12/22/2012    Procedure: TRANSURETHRAL RESECTION OF THE PROSTATE WITH GYRUS (TURP);  Surgeon: Fredricka Bonine, MD;  Location: [facility];  Service: Urology;  Laterality: N/A;    History  Smoking status  . Former Smoker -- 1.00 packs/day for 5 years  . Types: Cigarettes  . Quit date: 07/01/1965  Smokeless tobacco  . Never Used    History  Alcohol Use No    Family History  Problem Relation Age of Onset  . Asthma Mother   . Heart disease Mother 77  . Coronary artery disease Father   . Heart disease Father   . Cancer Sister 93    breast    Review of Systems: As noted in history of present illness.  All other systems were reviewed and are negative.  Physical Exam: BP 128/60  Pulse 77  Ht  6' (1.829 m)  Wt 188 lb (85.276 kg)  BMI 25.49 kg/m2  SpO2 97% He is a pleasant white male in no acute distress. HEENT exam is unremarkable. No JVD or bruits. Lungs are clear. Cardiac exam reveals a regular rate and rhythm without gallop or murmur. Abdomen is soft and nontender without mass or bruits. Legs are without edema. Pedal pulses are good. He does have a foot drop on the right. He is wearing an orthopedic shoe. He is alert and oriented x3. Cranial nerves II through XII are intact  LABORATORY DATA:   Assessment / Plan: 1. Coronary disease status post CABG in 2004. His last nuclear stress test in May of 2011 was normal. We will continue with his medical management and risk factor modification.  2. Hyperlipidemia. He remains on statin therapy. We will arrange for fasting lab work. Otherwise I'll see him back again in one year.  3. Neuropathy.

## 2013-07-12 ENCOUNTER — Other Ambulatory Visit (INDEPENDENT_AMBULATORY_CARE_PROVIDER_SITE_OTHER): Payer: Medicare Other

## 2013-07-12 DIAGNOSIS — I2510 Atherosclerotic heart disease of native coronary artery without angina pectoris: Secondary | ICD-10-CM

## 2013-07-12 DIAGNOSIS — E785 Hyperlipidemia, unspecified: Secondary | ICD-10-CM

## 2013-07-12 LAB — HEPATIC FUNCTION PANEL
ALT: 28 U/L (ref 0–53)
Total Bilirubin: 0.6 mg/dL (ref 0.3–1.2)

## 2013-07-12 LAB — LIPID PANEL
HDL: 62.2 mg/dL (ref >39.00)
LDL Cholesterol: 69 mg/dL (ref 0–99)
Total CHOL/HDL Ratio: 2
VLDL: 14.2 mg/dL (ref 0.0–40.0)

## 2013-07-12 LAB — BASIC METABOLIC PANEL
CO2: 31 mEq/L (ref 19–32)
Chloride: 102 mEq/L (ref 96–112)
GFR: 90.66 mL/min (ref >60.00)
Glucose, Bld: 90 mg/dL (ref 70–99)
Potassium: 3.8 mEq/L (ref 3.5–5.1)
Sodium: 141 mEq/L (ref 135–145)

## 2013-07-26 ENCOUNTER — Other Ambulatory Visit: Payer: Self-pay

## 2013-08-08 IMAGING — XA IR FLUORO GUIDE CV LINE*R*
1 series · 1 of 1 positions shown · non-contrast
Comparison: none

IR INSERTION OF A TUNNELED HEMODIALYSIS (PERMCATHETER) DIALYSIS
ACCESS CATHETER WITH ULTRASOUND AND FLUOROSCOPIC GUIDANCE

Date: 06/12/2012
CLINICAL HISTORY: 77-year-old male with myasthenia
gravis/autoimmune neuropathy.  He requires durable central venous
access for plasmapheresis.

[Series 2: single · 1 of 1 slices shown]
[im 1/1]
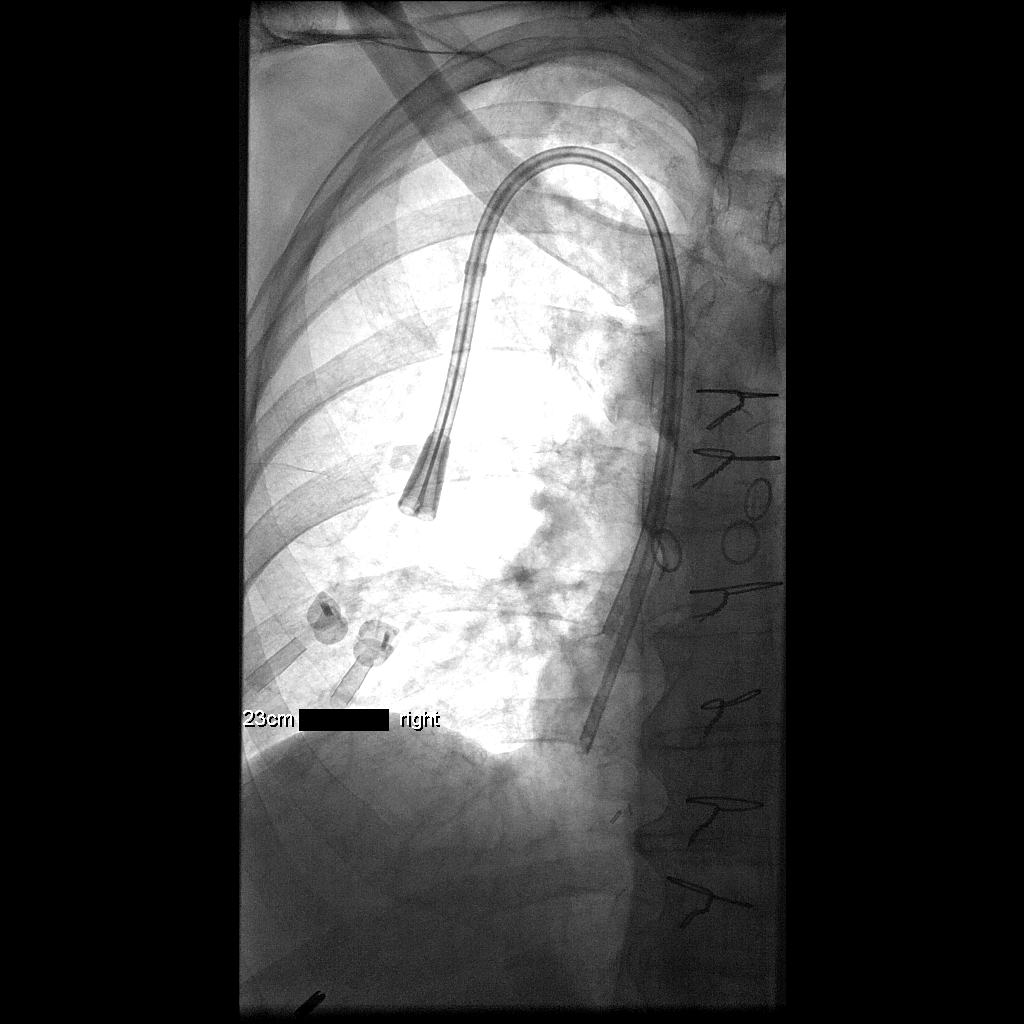

[1 of 1 positions shown; findings below may reference images not displayed]

Procedures Performed:
1. Ultrasound-guided puncture of the right internal jugular vein
2.  Fluoroscopic guidance for placement of a tunneled hemodialysis
catheter

Sedation: Moderate (conscious) sedation was used.  1 mg Versed, 25
mcg Fentanyl were administered intravenously.  The patient's vital
signs were monitored continuously by radiology nursing throughout
the procedure.

Sedation Time: 18 minutes

Fluoroscopy time: 0.6 minutes

Contrast volume: None

PROCEDURE/FINDINGS:

 Informed consent was obtained from the patient following
explanation of the procedure, risks, benefits and alternatives.
The patient understands, agrees and consents for the procedure.
All questions were addressed. A time out was performed.

Maximal barrier sterile technique utilized including caps, mask,
sterile gowns, sterile gloves, large sterile drape, hand hygiene,
and chlorhexidine skin prep.

The right neck was interrogated with ultrasound.  The right
internal jugular vein was identified and found to be widely patent.
Local anesthesia was achieved with infiltration of 1% lidocaine.
Under direct sonographic guidance, the right internal jugular vein
was punctured with a 21-gauge micropuncture needle.  An image was
obtained stored electronic medical record.

The 5-French transitional dilator was then advanced over a 0.018-
inch wire.  A 23 cm tip to cuff HemoSplit tunneled hemodialysis
catheter was selected.  An appropriate skin exit site was selected
inferior to the clavicle.  Local anesthesia was again achieved with
infiltration of 1% lidocaine.  A small dermatotomy was made at the
skin exit site.  The 23 cm HemoSplit catheter was then tunneled
through the subcutaneous soft tissues from the exit site, to the
venous entry site.  The 5-French sheath was then exchanged over a J
wire for a peel-away sheath.  The HemoSplit catheter was then
advanced through the peel-away sheath and the catheter tip
positioned in the upper right atrium.

The catheter flushed and aspirated with ease.  It was secured to
the skin with 0 Prolene suture.  The dermatotomy overlying the
venous access site was closed with a single, interrupted and
inverted 3-0 Vicryl suture.  The epidermis was sealed with
Dermabond.  The catheter was flushed and locked with heparinized
saline (4444 unit/ml).

There was no immediate complication.  The patient tolerated the
procedure well and was returned to his room in stable condition.
IMPRESSION: Successful placement of a right IJ tunneled hemodialysis catheter
for the purposes of plasmapheresis.  The catheter tip is positioned
in the upper right atrium and is ready for immediate use.

[REDACTED]

## 2013-08-28 DIAGNOSIS — G40309 Generalized idiopathic epilepsy and epileptic syndromes, not intractable, without status epilepticus: Secondary | ICD-10-CM | POA: Insufficient documentation

## 2013-09-14 ENCOUNTER — Ambulatory Visit (INDEPENDENT_AMBULATORY_CARE_PROVIDER_SITE_OTHER): Payer: Medicare Other | Admitting: Internal Medicine

## 2013-09-14 ENCOUNTER — Encounter: Payer: Self-pay | Admitting: Internal Medicine

## 2013-09-14 VITALS — BP 128/64 | HR 73 | Temp 98.2°F | Resp 12 | Ht 73.0 in | Wt 190.7 lb

## 2013-09-14 DIAGNOSIS — T148XXA Other injury of unspecified body region, initial encounter: Secondary | ICD-10-CM

## 2013-09-14 DIAGNOSIS — N62 Hypertrophy of breast: Secondary | ICD-10-CM

## 2013-09-14 DIAGNOSIS — E291 Testicular hypofunction: Secondary | ICD-10-CM

## 2013-09-14 DIAGNOSIS — IMO0002 Reserved for concepts with insufficient information to code with codable children: Secondary | ICD-10-CM

## 2013-09-14 LAB — VITAMIN D 25 HYDROXY (VIT D DEFICIENCY, FRACTURES): Vit D, 25-Hydroxy: 67 ng/mL (ref 30–89)

## 2013-09-14 NOTE — Progress Notes (Addendum)
Subjective:     Patient ID: Brandon Ray, male   DOB: [birth year], 78 y.o.   MRN: [id number]  HPI Mr. Caiazzo is a very pleasant 78 y.o. man with h/o severe polyneuropathy and monoclonal gammopathy, returning for f/u for primary hypogonadism, h/o recent fracture, and long-term steroid use. Last visit 6 mo ago.  He was previously investigated for POEMS sd. - prev. Seen by Dr Lamonte Sakai, then had an evaluation by Dr. Karie Kirks. Tuchman (Heme/Onc at [facility]) for this, but it was concluded that he actually has MGUS and will be just followed for it. He also had a fat pad biopsy to rule out amyloidosis and this was negative.   Please see my previous notes regarding patient's past medical history. He does have polyneuropathy, monoclonal gammopathy, gynecomastia and primary hypogonadism, but no organomegaly or significant skin changes. He also sees Dr. Tessa Lerner in pain clinic,  Dr Trinna Post (neurology) at the [facility]. Dr. Rushie Goltz is his local neurologist ([facility]). 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   The patient was found to have primary hypogonadism. We started testosterone gel at 5 grams daily, however, on this treatment, his free testosterone increased only to 26.5, so I suggested that he increase the dose to 7.5 g daily, which he could not tolerate well: his libido has certainly increased, however he did feel tension in his pelvis (he had this before) and also felt that his breasts were more tense. He went back to the 5g daily, which he continues today. Of note, the tension sensation continues.  Patient also had gynecomastia, with no evidence of malignancy on a bilateral mammogram. This is probably due to his primary hypogonadism, but he does not feel much improved after starting testosterone.   Patient has been using high-dose steroids for his neuropathy. He was initially started at 60 mg in 07/2012, and this dose was tapered down to a dose of 5 mg of prednisone now decreased from 10  mg since 06/17 - we tried to switch to hydrocortisone to facilitate the taper, but he needed more steroids for his neuropathy and had to switch back to Prednisone.   Working with Dr Earle Gell, his new neurologist, he is now down on the Prednisone dose to 1 mg daily (while adding Gabapentin), will stop soon. He was also taken off Dilantin for 4 days (after EEG).   Recent right big toe fracture (04/2013): likely 2/2 steroid tx. - DEXA 11/06/2012: L1-L4 T score of -0.9 and dual femur T score of -1.1 He takes vit D 4400 IU  + Calcium 600 mg daily.   Review of Systems - see above also Constitutional: no weight gain/loss, no fatigue, no subjective hyperthermia, + nocturia Eyes: + blurry vision(cataracts), no xerophthalmia ENT: no sore throat, no nodules palpated in throat, no dysphagia/odynophagia, no hoarseness Cardiovascular: no CP/SOB/palpitations/leg swelling Respiratory: no cough/SOB Gastrointestinal: no N/V/D/C Musculoskeletal: no muscle/joint aches.  Skin: no rashes; + easy bruising enlarged breasts   I reviewed pt's medications, allergies, PMH, social hx, family hx and no changes required, except as mentioned above.  Objective:   Physical Exam BP 128/64  Pulse 73  Temp(Src) 98.2 F (36.8 C)  Resp 12  Ht $R'6\' 1"'fR$  (1.854 m)  Wt 190 lb 11.2 oz (86.501 kg)  BMI 25.17 kg/m2  SpO2 96% Wt Readings from Last 3 Encounters:  09/14/13 190 lb 11.2 oz (86.501 kg)  07/11/13 188 lb (85.276 kg)  03/16/13 196 lb (88.905 kg)   Constitutional: overweight, in NAD,  difficult to move to examiner's table or to stand Eyes: PERRLA, EOMI, no exophthalmos ENT: moist mucous membranes, no thyromegaly, no cervical lymphadenopathy Cardiovascular: RRR, No MRG Respiratory: CTA B Gastrointestinal: abdomen soft, NT, ND, BS+ Musculoskeletal: kyphosis. Skin: moist, warm Neurological: mild tremor with outstretched hands, no DTRs detected  Assessment:     1. Polyneuropathy - unlikely POEMS syndrome per  evaluation by Hem/Onc at [facility] - amyloidosis ruled out by negative fat pad biopsy - managed by Dr. Trinna Post at [facility] and Dr. Earle Gell at [place]   2. MGUS - monoclonal gammopathy - now managed by Dr.Tuchman at [facility], previously Dr. Lamonte Sakai  3. Primary hypogonadism - + gynecomastia bilaterally - per mammography - + testicular atrophy - started testosterone gel >> a sensation of pressure in abdomen, but this did not subside after stopping testosterone gel for 1 mo after his surgery for Transurethral resection of prostate residual growth in 12/22/2012. Restarted at the end of 12/2012. - free Testosterone increased from 2.1 to 26.5 on 5 g  Androgel daily - again increased tension in lower abdomen especially after increasing the dose of testosterone to 7.5 g daily at the end of 01/2013 - since then, we have gone back to 5 g daily.   3. Osteoporosis - h/o sacral fracture - on steroid taper now, but not sure if he can tolerate coming off - DEXA 11/06/2012: L1-L4 T score of -0.9 and dual femur T score of -1.1 - spine X-ray: no vb fx - on adequate Ca+vit D, vit D 55  4. Long-term use of steroids - previously on prednisone 60 mg starting at the end of last year - Was able to taper to 5 mg of prednisone daily     Plan:     1. Primary hypogonadism - Patient increased the dose of his testosterone from 5 to 7.5 g daily But developed pelvic pressure and more tension in his breasts. At that time, I advised him to go back to the 5 g daily, but he did not notice that the pelvic pressure and breast tension improved. Since then, he had a mini TURP procedure to help with his bladder emptying. He does not feel that this helped a lot either. He does agree to retry to increase the dose of testosterone after we obtained a testosterone level today, depending on the result.  - of note, his urination pattern, remains "gravity-driven" as he mentions.  - Will need a new prescription for testosterone sent to [facility]  before 09/19/2013  2. Gynecomastia - still feels tension in breasts, despite decreasing the dose to 5 g daily 6 months ago  3. Long-term use of steroids - He has now tapered down Prednisone to 1 mg daily >> he can likely safely stop w/o adrenal insufficiency >> will stop next week  4. Osteoporosis  - I believe that he does have this, despite a normal DEXA scan, especially in the light of his fracture - We discussed about possibly starting medicines, however, he is now coming off prednisone, he stopped Dilantin ,and will also increase his testosterone. I think all these will bring a salutary contribution to improve his BMD, so we can hold off adding antiresorptives now. He agrees with the plan. - we will need another DEXA scan in a year from now - We'll check a vitamin D level today  - he will return for regular appointment in 6 months but I will keep in touch with him through my chart  Office Visit on 09/14/2013  Component Date Value Range Status  . Testosterone 09/14/2013 800  300 - 890 ng/dL Final   Comment:           Tanner Stage       Male              Male                                        I              < 30 ng/dL        < 10 ng/dL                                        II             < 150 ng/dL       < 30 ng/dL                                        III            100-320 ng/dL     < 35 ng/dL                                        IV             200-970 ng/dL     15-40 ng/dL                                        V/Adult        300-890 ng/dL     10-70 ng/dL                             . Sex Hormone Binding 09/14/2013 76* 13 - 71 nmol/L Final  . Testosterone, Free 09/14/2013 100.1  47.0 - 244.0 pg/mL Final   Comment:                            The concentration of free testosterone is derived from a mathematical                          expression based on constants for the binding of testosterone to sex                          hormone-binding globulin and albumin.  .  Testosterone-% Free 09/14/2013 1.3* 1.6 - 2.9 % Final  . Vit D, 25-Hydroxy 09/14/2013 67  30 - 89 ng/mL Final   Comment: This assay accurately quantifies Vitamin D, which is the sum of the                          25-Hydroxy forms of Vitamin D2 and D3.  Studies have shown that the  optimum concentration of 25-Hydroxy Vitamin D is 30 ng/mL or higher.                           Concentrations of Vitamin D between 20 and 29 ng/mL are considered to                          be insufficient and concentrations less than 20 ng/mL are considered                          to be deficient for Vitamin D.   Msg sent to pt: Dear Mr Wilbourne, Excellent results, your testosterone is perfect! And the vitamin D, too. Let's continue the same testosterone dose: 5g a day. I will call it in to [facility]. Please let me know if you have any questions. Sincerely, Philemon Kingdom MD

## 2013-09-14 NOTE — Patient Instructions (Signed)
Please stop at the lab.  I will send you the lab results through [place]. Please come back for a follow-up appointment in 6 months

## 2013-09-17 ENCOUNTER — Encounter: Payer: Self-pay | Admitting: Internal Medicine

## 2013-09-17 LAB — TESTOSTERONE, FREE, TOTAL, SHBG
Sex Hormone Binding: 76 nmol/L — ABNORMAL HIGH (ref 13–71)
Testosterone, Free: 100.1 pg/mL (ref 47.0–244.0)
Testosterone: 800 ng/dL (ref 300–890)

## 2013-09-17 MED ORDER — TESTOSTERONE 50 MG/5GM (1%) TD GEL: 5.0000 g | Freq: Every day | TRANSDERMAL | Status: DC

## 2013-09-27 ENCOUNTER — Telehealth: Payer: Self-pay | Admitting: *Deleted

## 2013-09-27 NOTE — Telephone Encounter (Signed)
Hilda Blades, representative with [facility] called stating the patient has been approved for Androgel from 09/24/13 to 09/24/14. They will mail an approval to Dr Cruzita Lederer.

## 2014-03-13 ENCOUNTER — Encounter: Payer: Self-pay | Admitting: Internal Medicine

## 2014-03-13 ENCOUNTER — Ambulatory Visit (INDEPENDENT_AMBULATORY_CARE_PROVIDER_SITE_OTHER): Payer: Medicare Other | Admitting: Internal Medicine

## 2014-03-13 VITALS — BP 122/70 | HR 82 | Temp 97.7°F | Resp 12 | Wt 191.0 lb

## 2014-03-13 DIAGNOSIS — M810 Age-related osteoporosis without current pathological fracture: Secondary | ICD-10-CM

## 2014-03-13 DIAGNOSIS — E291 Testicular hypofunction: Secondary | ICD-10-CM

## 2014-03-13 DIAGNOSIS — N62 Hypertrophy of breast: Secondary | ICD-10-CM

## 2014-03-13 LAB — CBC
HEMATOCRIT: 51 % (ref 39.0–52.0)
HEMOGLOBIN: 17 g/dL (ref 13.0–17.0)
MCHC: 33.3 g/dL (ref 30.0–36.0)
MCV: 95.7 fl (ref 78.0–100.0)
Platelets: 192 10*3/uL (ref 150.0–400.0)
RBC: 5.32 Mil/uL (ref 4.22–5.81)
RDW: 15.4 % (ref 11.5–15.5)
WBC: 5.5 10*3/uL (ref 4.0–10.5)

## 2014-03-13 LAB — PSA: PSA: 0.48 ng/mL (ref 0.10–4.00)

## 2014-03-13 MED ORDER — TESTOSTERONE 50 MG/5GM (1%) TD GEL: 5.0000 g | Freq: Every day | TRANSDERMAL | Status: DC

## 2014-03-13 NOTE — Progress Notes (Signed)
Subjective:     Patient ID: Brandon Ray, male   DOB: 02-25-1935, 79 y.o.   MRN: [id number]  HPI [name] is a very pleasant 79 y.o. man with h/o severe polyneuropathy and monoclonal gammopathy, returning for f/u for primary hypogonadism, h/o recent fracture, and long-term steroid use. Last visit 6 mo ago.  He walks 1.5 miles a day now. He sometimes feels he has an erection when he walks - but he does not. He stopped testosterone for 7 days from May 31st.   He is on gabapentin and Lyrica.   Reviewed hx: He was previously investigated for POEMS sd. - prev. Seen by Dr Lamonte Sakai, then had an evaluation by Dr. Karie Kirks. Tuchman (Heme/Onc at [facility]) for this, but it was concluded that he actually has MGUS and will be just followed for it. He also had a fat pad biopsy to rule out amyloidosis and this was negative.   Please see my previous notes regarding patient's past medical history. He does have polyneuropathy, monoclonal gammopathy, gynecomastia and primary hypogonadism, but no organomegaly or significant skin changes. He also sees Dr. Tessa Lerner in pain clinic,  Dr Trinna Post (neurology) at the [facility]. Dr. Rushie Goltz is his local neurologist ([facility]). 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   The patient was found to have primary hypogonadism. We started testosterone gel at 5 grams daily, however, on this treatment, his free testosterone increased only to 26.5, so I suggested that he increase the dose to 7.5 g daily, which he could not tolerate well: his libido has certainly increased, however he did feel tension in his pelvis (he had this before) and also felt that his breasts were more tense. He went back to the 5g daily, with subsequent normalization of his testosterone levels: Component     Latest Ref Rng 09/14/2013  Testosterone     300 - 890 ng/dL 800  Sex Hormone Binding     13 - 71 nmol/L 76 (H)  Testosterone Free     47.0 - 244.0 pg/mL 100.1  Testosterone-% Free      1.6 - 2.9 % 1.3 (L)  He continues on this dose. Of note, the abdominal tension sensation continues but it is frustrating as he has no erections. Last PSA (12/2012): 0.15 Last DRE: 1 year ago Last Hb/HT (01/2013) normal: Lab Results  Component Value Date   WBC 5.2 01/26/2013   HGB 15.4 01/26/2013   HCT 45.3 01/26/2013   MCV 95.5 01/26/2013   PLT 200 01/26/2013   Patient also has gynecomastia, with no evidence of malignancy on a bilateral mammogram. This is probably due to his primary hypogonadism, but he does not feel much improved after starting testosterone. His prolactin was normal.  Patient has been using high-dose steroids for his neuropathy. He was initially started at 60 mg in 07/2012, and this dose was tapered down >> He stopped Prednisone around 09/2013.   He stopped Dilantin (also saw an epileptologist).  Recent right big toe fracture (04/2013): likely 2/2 steroid tx. - DEXA 11/06/2012: L1-L4 T score of -0.9 and dual femur T score of -1.1 He takes vit D 4400 IU  + Calcium 600 mg daily.   Review of Systems - see above also Constitutional: no weight gain/loss, no fatigue, no subjective hyperthermia, + nocturia Eyes: no more blurry vision(had cataract sx), no xerophthalmia ENT: no sore throat, no nodules palpated in throat, no dysphagia/odynophagia, no hoarseness Cardiovascular: no CP/SOB/palpitations/leg swelling Respiratory: no cough/SOB Gastrointestinal: no  N/V/D/C Musculoskeletal: no muscle/joint aches.  Skin: no rashes + enlarged breasts, no erections  I reviewed pt's medications, allergies, PMH, social hx, family hx and no changes required, except as mentioned above. He started Lyrica as ad addition to Neurontin and he is off Dilantin.  Objective:   Physical Exam BP 122/70  Pulse 82  Temp(Src) 97.7 F (36.5 C) (Oral)  Resp 12  Wt 191 lb (86.637 kg)  SpO2 95% Wt Readings from Last 3 Encounters:  03/13/14 191 lb (86.637 kg)  09/14/13 190 lb 11.2 oz (86.501 kg)   07/11/13 188 lb (85.276 kg)   Constitutional: overweight, in NAD, difficult to move to examiner's table or to stand Eyes: PERRLA, EOMI, no exophthalmos ENT: moist mucous membranes, no thyromegaly, no cervical lymphadenopathy Cardiovascular: RRR, No MRG Respiratory: CTA B Gastrointestinal: abdomen soft, NT, ND, BS+ Musculoskeletal: kyphosis. Skin: moist, warm Neurological: mild tremor with outstretched hands, no DTRs detected  Assessment:     1. Polyneuropathy - unlikely POEMS syndrome per evaluation by Hem/Onc at [facility] - amyloidosis ruled out by negative fat pad biopsy - managed by Dr. Burt Ek at [facility] and Dr. Ezequiel Essex at [facility]   2. MGUS - monoclonal gammopathy - now managed by Dr.Tuchman at [facility], previously Dr. Gaylyn Rong  3. Primary hypogonadism - + gynecomastia bilaterally - per mammography - + testicular atrophy - started testosterone gel >> a sensation of pressure in abdomen, but this did not subside after stopping testosterone gel for 1 mo after his surgery for Transurethral resection of prostate residual growth in 12/22/2012. Restarted at the end of 12/2012. - again increased tension in lower abdomen especially after increasing the dose of testosterone to 7.5 g daily at the end of 01/2013 - since then, we have gone back to 5 g daily.   3. Osteoporosis - h/o sacral fracture, right big toe fracture (04/2013) - on steroid taper  - DEXA 11/06/2012: L1-L4 T score of -0.9 and dual femur T score of -1.1 - spine X-ray: no vb fx - on adequate Ca+vit D, vit D 55 >> 67 (08/2013)  4. Long-term use of steroids - previously on prednisone 60 mg starting at the end of 2013 - Was able to taper >> now off    Plan:     1. Primary hypogonadism - Patient continues his Androgel 5 g daily. 6 mo ago, his testosterone level was normal on this dose. He still has  pelvic pressure (at this point, he believes this is not related to the testosterone tx). He also continues to have tension in his  breasts - his urination pattern, remains "gravity-driven" as he mentions.  - for his ED >> advised him to try the Viagra recommended by his urologist - today we will check: Orders Placed This Encounter  Procedures  . Testosterone, free, total  . PSA  . CBC  - advised him to continue to get DRE's once a year by his urologist - given him a new prescription for testosterone   2. Gynecomastia - still feels tension in breasts, despite T replacement  3. Long-term use of steroids - He is now off Prednisone  - no signs of adrenal insufficiency  4. Osteoporosis  - I believe that he does have this, especially in the light of his fracture - at last visit, we discussed about possibly starting medicines, however, he was coming off prednisone, stopped Dilantin, we were increasing his testosterone, so we decided to hold off adding antiresorptives. - we will need another DEXA scan  in 08/2014  - continue vitamin D repletion - last 2 levels normal - will check a vit D level at next visit  - he will return for regular appointment in 6 months but I will keep in touch with him through my chart  Office Visit on 03/13/2014  Component Date Value Ref Range Status  . Testosterone 03/13/2014 1002* 300 - 890 ng/dL Final   Comment:           Tanner Stage       Male              Male                                        I              < 30 ng/dL        < 10 ng/dL                                        II             < 150 ng/dL       < 30 ng/dL                                        III            100-320 ng/dL     < 35 ng/dL                                        IV             200-970 ng/dL     15-40 ng/dL                                        V/Adult        300-890 ng/dL     10-70 ng/dL                             . Sex Hormone Binding 03/13/2014 65  13 - 71 nmol/L Final  . Testosterone, Free 03/13/2014 151.6  47.0 - 244.0 pg/mL Final   Comment:                            The concentration of free  testosterone is derived from a mathematical                          expression based on constants for the binding of testosterone to sex                          hormone-binding globulin and albumin.  . Testosterone-% Free 03/13/2014 1.5* 1.6 - 2.9 % Final  . PSA 03/13/2014 0.48  0.10 - 4.00 ng/mL Final  . WBC 03/13/2014 5.5  4.0 - 10.5 K/uL Final  .  RBC 03/13/2014 5.32  4.22 - 5.81 Mil/uL Final  . Platelets 03/13/2014 192.0  150.0 - 400.0 K/uL Final  . Hemoglobin 03/13/2014 17.0  13.0 - 17.0 g/dL Final  . HCT 03/13/2014 51.0  39.0 - 52.0 % Final  . MCV 03/13/2014 95.7  78.0 - 100.0 fl Final  . MCHC 03/13/2014 33.3  30.0 - 36.0 g/dL Final  . RDW 03/13/2014 15.4  11.5 - 15.5 % Final   Msg sent: Dear Mr Hardgrove, The total testosterone is a little high, while the free testosterone is in the mid-normal range. Since the hemoglobin is at the higher end of normal and the PSA is a little higher than before, I would suggest to take the Androgel only 5 out of 7 days. Sincerely, Philemon Kingdom MD

## 2014-03-13 NOTE — Patient Instructions (Signed)
Please stop at the lab. Continue Androgel 5g daily for now. You can try Viagra to see if it helps. Continue calcium and vitamin D supplements. Please come back for a follow-up appointment in 6 months.

## 2014-03-14 LAB — TESTOSTERONE, FREE, TOTAL, SHBG
SEX HORMONE BINDING: 65 nmol/L (ref 13–71)
TESTOSTERONE FREE: 151.6 pg/mL (ref 47.0–244.0)
TESTOSTERONE: 1002 ng/dL — AB (ref 300–890)
Testosterone-% Free: 1.5 % — ABNORMAL LOW (ref 1.6–2.9)

## 2014-03-15 ENCOUNTER — Ambulatory Visit: Payer: Medicare Other | Admitting: Internal Medicine

## 2014-03-31 ENCOUNTER — Encounter: Payer: Self-pay | Admitting: Internal Medicine

## 2014-04-01 ENCOUNTER — Other Ambulatory Visit: Payer: Self-pay | Admitting: *Deleted

## 2014-04-01 DIAGNOSIS — E291 Testicular hypofunction: Secondary | ICD-10-CM

## 2014-04-01 MED ORDER — TESTOSTERONE 50 MG/5GM (1%) TD GEL: 5.0000 g | Freq: Every day | TRANSDERMAL | Status: DC

## 2014-04-03 ENCOUNTER — Other Ambulatory Visit: Payer: Self-pay | Admitting: *Deleted

## 2014-04-03 DIAGNOSIS — E291 Testicular hypofunction: Secondary | ICD-10-CM

## 2014-04-03 MED ORDER — ANDROGEL 50 MG/5GM (1%) TD GEL: 5.0000 g | Freq: Every day | TRANSDERMAL | Status: DC

## 2014-04-03 NOTE — Telephone Encounter (Signed)
Brandon Ray with [employer] called to let us know that the pt was already approved until Jan 2016 for the name brand Androgel, not the generic testosterone. Resent a new rx to [facility] with brand name Androgel for pt.

## 2014-07-15 ENCOUNTER — Encounter: Payer: Self-pay | Admitting: Cardiology

## 2014-07-15 ENCOUNTER — Ambulatory Visit (INDEPENDENT_AMBULATORY_CARE_PROVIDER_SITE_OTHER): Payer: Medicare Other | Admitting: Cardiology

## 2014-07-15 VITALS — BP 106/60 | HR 70 | Ht 73.0 in | Wt 195.0 lb

## 2014-07-15 DIAGNOSIS — I2510 Atherosclerotic heart disease of native coronary artery without angina pectoris: Secondary | ICD-10-CM

## 2014-07-15 DIAGNOSIS — E785 Hyperlipidemia, unspecified: Secondary | ICD-10-CM

## 2014-07-15 LAB — BASIC METABOLIC PANEL
BUN: 17 mg/dL (ref 6–23)
CO2: 32 mEq/L (ref 19–32)
Calcium: 9.6 mg/dL (ref 8.4–10.5)
Chloride: 102 mEq/L (ref 96–112)
Creat: 1.18 mg/dL (ref 0.50–1.35)
Glucose, Bld: 87 mg/dL (ref 70–99)
Potassium: 4.8 mEq/L (ref 3.5–5.3)
Sodium: 139 mEq/L (ref 135–145)

## 2014-07-15 LAB — HEPATIC FUNCTION PANEL
ALT: 51 U/L (ref 0–53)
AST: 34 U/L (ref 0–37)
Albumin: 4.4 g/dL (ref 3.5–5.2)
Alkaline Phosphatase: 86 U/L (ref 39–117)
Bilirubin, Direct: 0.1 mg/dL (ref 0.0–0.3)
Indirect Bilirubin: 0.4 mg/dL (ref 0.2–1.2)
Total Bilirubin: 0.5 mg/dL (ref 0.2–1.2)
Total Protein: 7 g/dL (ref 6.0–8.3)

## 2014-07-15 LAB — LIPID PANEL
Cholesterol: 138 mg/dL (ref 0–200)
HDL: 56 mg/dL (ref >39)
LDL Cholesterol: 68 mg/dL (ref 0–99)
Total CHOL/HDL Ratio: 2.5 Ratio
Triglycerides: 68 mg/dL (ref <150)
VLDL: 14 mg/dL (ref 0–40)

## 2014-07-15 NOTE — Patient Instructions (Signed)
Continue your current therapy  I will check lab work today  I will see you in one year.

## 2014-07-15 NOTE — Progress Notes (Addendum)
Brandon Ray Date of Birth: 1935/03/30   History of Present Illness: Brandon Ray is seen today for followup. He has a history of coronary disease and is status post CABG in 2007. He is still battling with severe peripheral neuropathy felt to be related to a vaccine. This has significantly limited his activity. He is no longer able to hike. He really denies any significant cardiac complaints. He denies chest pain, shortness of breath, or palpitations. He has no edema.   Current Outpatient Prescriptions on File Prior to Visit  Medication Sig Dispense Refill  . ANDROGEL 50 MG/5GM (1%) GEL Place 5 g onto the skin daily.  30 Package  4  . aspirin 81 MG tablet Take 81 mg by mouth daily.       Marland Kitchen atorvastatin (LIPITOR) 20 MG tablet Take 1 tablet (20 mg total) by mouth daily.  90 tablet  3  . calcium-vitamin D (OSCAL WITH D) 500-200 MG-UNIT per tablet Take 1 tablet by mouth daily.        . Cholecalciferol 2000 UNITS TBDP Take 4,000 Units by mouth daily.      . Coenzyme Q10 (CO Q-10 PO) Take 1 tablet by mouth daily.      Marland Kitchen gabapentin (NEURONTIN) 100 MG capsule Take 600 mg by mouth 3 (three) times daily.       . Multiple Vitamin (MULTIVITAMIN WITH MINERALS) TABS tablet Take 1 tablet by mouth daily.      . Omega-3 Fatty Acids (FISH OIL) 1200 MG CAPS Take 1,200 mg by mouth daily.      Marland Kitchen PHENobarbital (LUMINAL) 97.2 MG tablet Take 97.2 mg by mouth daily.        . polyethylene glycol (MIRALAX / GLYCOLAX) packet Take 17 g by mouth daily.       No current facility-administered medications on file prior to visit.    Allergies  Allergen Reactions  . Boostrix [Tetanus-Diphth-Acell Pertussis] Other (See Comments)    Neuropathy symptoms  . Other Other (See Comments)    Flu shot-neuropathy symptoms  . Amoxicillin Rash    Upper torso only.    Past Medical History  Diagnosis Date  . Coronary artery disease 01/2003    CABG  . Hyperlipidemia   . Weakness 2013-May began    lower extemities  .  Pleural effusion 05/2011    from abdominal surgery  . Small bowel obstruction 05/2011    due to adhesion  . Free monoclonal light chain     urine  . Peripheral neuropathy 05/2011     follow up with Dr. Tish Frederickson multiple issues  . Seizures 1950's    last one 1960  . BPH (benign prostatic hypertrophy) 2014    Past Surgical History  Procedure Laterality Date  . Cardiac catheterization  01/27/2003    NORMAL LEFT VENTRICULAR SIZE WITH MILD FOCAL LATERAL WALL HYPOKINESIA. EF 60%  . Coronary artery bypass graft  2004    LIMA GRAFT TO THE LAD, SAPHENOUS VEIN GRAFT TO THE DIAGONAL, SAPHENOUS VEIN GRAFT TO THE FIRST OM, SAPHENOUS VEIN GRAFT TO THE PDA  . Transurethral resection of prostate  1992  . Hand surgery  2000    left  . Abdominal surgery  05/2011    bowel obstruction  . Ankle fracture surgery  1997    left  . Transurethral resection of prostate N/A 12/22/2012    Procedure: TRANSURETHRAL RESECTION OF THE PROSTATE WITH GYRUS (TURP);  Surgeon: Fredricka Bonine, MD;  Location: [facility];  Service: Urology;  Laterality: N/A;    History  Smoking status  . Former Smoker -- 1.00 packs/day for 5 years  . Types: Cigarettes  . Quit date: 07/01/1965  Smokeless tobacco  . Never Used    History  Alcohol Use No    Family History  Problem Relation Age of Onset  . Asthma Mother   . Heart disease Mother 106  . Coronary artery disease Father   . Heart disease Father   . Cancer Sister 45    breast    Review of Systems: As noted in history of present illness.  All other systems were reviewed and are negative.  Physical Exam: BP 106/60  Pulse 70  Ht $R'6\' 1"'pZ$  (1.854 m)  Wt 195 lb (88.451 kg)  BMI 25.73 kg/m2 He is a pleasant white male in no acute distress. HEENT exam is unremarkable. No JVD or bruits. Lungs are clear. Cardiac exam reveals a regular rate and rhythm without gallop or murmur. Abdomen is soft and nontender without mass or bruits. Legs are without edema.  Pedal pulses are good. He does have a foot drop on the right. He is wearing an orthopedic shoe and brace on his right ankle. He walks with a cane. He is alert and oriented x3. Cranial nerves II through XII are intact  LABORATORY DATA: Ecg today shows NSR with an incomplete RBBB. I have personally reviewed and interpreted this study.   Assessment / Plan: 1. Coronary disease status post CABG in 2004. His last nuclear stress test in May of 2011 was normal. We will continue with his medical management and risk factor modification.  2. Hyperlipidemia. He remains on statin therapy. We will arrange for fasting lab work today.   3. Peripheral Neuropathy.  I will follow up in one year.

## 2014-07-25 ENCOUNTER — Other Ambulatory Visit: Payer: Self-pay | Admitting: Cardiology

## 2014-07-29 ENCOUNTER — Encounter: Payer: Self-pay | Admitting: Internal Medicine

## 2014-10-01 ENCOUNTER — Ambulatory Visit: Payer: Medicare Other | Admitting: Internal Medicine

## 2014-10-04 ENCOUNTER — Ambulatory Visit (INDEPENDENT_AMBULATORY_CARE_PROVIDER_SITE_OTHER): Payer: Medicare Other | Admitting: Internal Medicine

## 2014-10-04 ENCOUNTER — Encounter: Payer: Self-pay | Admitting: Internal Medicine

## 2014-10-04 ENCOUNTER — Telehealth: Payer: Self-pay | Admitting: *Deleted

## 2014-10-04 VITALS — BP 114/62 | HR 79 | Temp 97.5°F | Resp 12 | Wt 199.0 lb

## 2014-10-04 DIAGNOSIS — N62 Hypertrophy of breast: Secondary | ICD-10-CM

## 2014-10-04 DIAGNOSIS — M810 Age-related osteoporosis without current pathological fracture: Secondary | ICD-10-CM

## 2014-10-04 DIAGNOSIS — E291 Testicular hypofunction: Secondary | ICD-10-CM

## 2014-10-04 LAB — CBC
HCT: 47.4 % (ref 39.0–52.0)
Hemoglobin: 15.7 g/dL (ref 13.0–17.0)
MCHC: 33.1 g/dL (ref 30.0–36.0)
MCV: 95.5 fl (ref 78.0–100.0)
Platelets: 212 10*3/uL (ref 150.0–400.0)
RBC: 4.97 Mil/uL (ref 4.22–5.81)
RDW: 13.9 % (ref 11.5–15.5)
WBC: 4.2 10*3/uL (ref 4.0–10.5)

## 2014-10-04 LAB — VITAMIN D 25 HYDROXY (VIT D DEFICIENCY, FRACTURES): VITD: 56.34 ng/mL (ref 30.00–100.00)

## 2014-10-04 NOTE — Patient Instructions (Signed)
Plese stop at the lab. Please come back for a follow-up appointment in 6 months. We will order the DEXA scan at [facility] after 11/06/2014.

## 2014-10-04 NOTE — Telephone Encounter (Signed)
Scheduled pt's DEXA. February, 19th at 11:00 am (arrival time 10:45 am). Called pt and lvm advising him of the date, time and location (along with the address and phone #). Advised him to call back with any questions. Be advised.

## 2014-10-04 NOTE — Progress Notes (Addendum)
Subjective:     Patient ID: Brandon Ray, male   DOB: April 20, 1935, 80 y.o.   MRN: [id number]  HPI Mr. Hangartner is a very pleasant 80 y.o. man with h/o severe polyneuropathy and monoclonal gammopathy, returning for f/u for primary hypogonadism, osteoporosis/h/o fracture, and long-term steroid use. Last visit 6 mo ago. He is here with his wife who offers part of the hx.  He now cannot feel when he urinates >> urinary rehab by his urologist.  He is on gabapentin >> does not work. He still has discomfort in the mid thoracic region, pelvic region and his hands.  Reviewed hx: He was previously investigated for POEMS sd. - prev. Seen by Dr Lamonte Sakai, then had an evaluation by Dr. Karie Kirks. Tuchman (Heme/Onc at [facility]) for this, but it was concluded that he actually has MGUS and will be just followed for it. He also had a fat pad biopsy to rule out amyloidosis and this was negative.   Please see my previous notes regarding patient's past medical history. He does have polyneuropathy, monoclonal gammopathy, gynecomastia and primary hypogonadism, but no organomegaly or significant skin changes. He also sees Dr. Tessa Lerner in pain clinic,  Dr Trinna Post (neurology) at the [facility]. Dr. Rushie Goltz is his local neurologist ([facility]). 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   Patient also has primary hypogonadism. We started testosterone gel at 5 grams daily, then we increased the dose to 7.5 g daily, which he could not tolerate well: his libido increased, however he did feel tension in his pelvis (he had this before) and also felt that his breasts were more tense. He went back to the 5g daily, then we had to decrease further to 5 mg 5/7 days at last visit:  Component     Latest Ref Rng 02/09/2013 09/14/2013 03/13/2014  Testosterone     300 - 890 ng/dL 224.92 (L) 800 1002 (H)  Sex Hormone Binding     13 - 71 nmol/L  76 (H) 65  Testosterone Free     47.0 - 244.0 pg/mL  100.1 151.6  Testosterone-%  Free     1.6 - 2.9 %  1.3 (L) 1.5 (L)  PSA     0.10 - 4.00 ng/mL   0.48   Last DRE: 08/2014 Last Hb/HT (01/2013) normal, however, his hemoglobin was at the upper limit of normal: Lab Results  Component Value Date   WBC 5.5 03/13/2014   HGB 17.0 03/13/2014   HCT 51.0 03/13/2014   MCV 95.7 03/13/2014   PLT 192.0 03/13/2014   He is concerned about the price of testosterone, which, for him, is ~ $200 per month.  Patient also has gynecomastia, with no evidence of malignancy on a bilateral mammogram. This is probably due to his primary hypogonadism, but he does not feel much improved after starting testosterone. His prolactin was normal. As mentioned above, he has dysesthesia at the level of his nipples, which is probably related to his neuropathy.  Patient has been using high-dose steroids for his neuropathy. He was initially started at 60 mg in 07/2012, and this dose was tapered down >> He stopped Prednisone around 09/2013. He stayed off Prednisone since then. He may restart them to see if his sxs improve.   Patient with low bone mineral density, but not in the range for osteoporosis, however with a sacral fracture and also a more recent right big toe fracture (04/2013): likely 2/2 steroid tx. - DEXA 11/06/2012: L1-L4 T score  of -0.9 and dual femur T score of -1.1  He takes vit D 4400 IU  + Calcium 600 mg daily.   Review of Systems - see above also Constitutional: + weight gain, no fatigue, no subjective hyperthermia, + nocturia Eyes: no more blurry vision(had cataract sx), no xerophthalmia ENT: no sore throat, no nodules palpated in throat, no dysphagia/odynophagia, no hoarseness Cardiovascular: no CP/SOB/palpitations/+ leg swelling Respiratory: no cough/SOB Gastrointestinal: no N/V/D/C Musculoskeletal: no muscle/joint aches.  Skin: no rashes, + easy bruising + enlarged breasts, no erections  I reviewed pt's medications, allergies, PMH, social hx, family hx and no changes required,  except as mentioned above. He started Lyrica as ad addition to Neurontin and he is off Dilantin.  Objective:   Physical Exam BP 114/62 mmHg  Pulse 79  Temp(Src) 97.5 F (36.4 C) (Oral)  Resp 12  Wt 199 lb (90.266 kg)  SpO2 97% Wt Readings from Last 3 Encounters:  10/04/14 199 lb (90.266 kg)  07/15/14 195 lb (88.451 kg)  03/13/14 191 lb (86.637 kg)   Constitutional: overweight, in NAD, difficult to move to examiner's table or to stand. Walks with a cane. Eyes: PERRLA, EOMI, no exophthalmos ENT: moist mucous membranes, no thyromegaly, no cervical lymphadenopathy Cardiovascular: RRR, No MRG Respiratory: CTA B Gastrointestinal: abdomen soft, NT, ND, BS+ Musculoskeletal: kyphosis. Skin: moist, warm Neurological: mild tremor with outstretched hands, no DTRs detected  Assessment:     1. Primary hypogonadism - + gynecomastia bilaterally - per mammography - + testicular atrophy - started testosterone gel >> a sensation of pressure in abdomen, but this did not subside after stopping testosterone gel for 1 mo after his surgery for Transurethral resection of prostate residual growth in 12/22/2012. Restarted at the end of 12/2012. - again increased tension in lower abdomen especially after increasing the dose of testosterone to 7.5 g daily at the end of 01/2013, but persisting on 5 mg daily or 5 mg 5 out of 7 days   2. Osteoporosis - h/o sacral fracture, right big toe fracture (04/2013) - Off steroids now - DEXA 11/06/2012: L1-L4 T score of -0.9 and dual femur T score of -1.1 - spine X-ray: no vb fx - on adequate Ca+vit D, vit D levels normal  3. Long-term use of steroids - previously on prednisone 60 mg starting at the end of 2013 - Was able to taper >> now off since 09/2013  4. Polyneuropathy - unlikely POEMS syndrome per evaluation by Hem/Onc at [facility] - amyloidosis ruled out by negative fat pad biopsy - managed by Dr. Trinna Post at [facility] and Dr. Earle Gell at [facility]   5.  MGUS - monoclonal gammopathy - now managed by Dr.Tuchman at [facility], previously Dr. Lamonte Sakai    Plan:     1. Primary hypogonadism - Patient continues his Androgel 5 g approximately 5 out of 7 days. He tells me that over Christmas, when his children were visiting, he had a period in which she did not take it. 6 mo ago, his total testosterone level was high, on the previous dose of 5 g daily. He still has  pelvic pressure (at this point, he believes this is not related to the testosterone tx). He also continues to have tension in his breasts - He is up-to-date with DRE's per his urologist, Dr. Junious Silk - we will check testosterone level today and will also add a CBC, since last hemoglobin was borderline at the upper limit of normal - We discussed about different formulation of testosterone that  he can try, I would not recommend injections due to fluctuating levels of testosterone between injections, but we can try Androderm or AndroGel 1.6% if these are approved by his insurance and are cheaper  2. Gynecomastia  - still feels tension in breasts, despite T replacement  3. Long-term use of steroids - He is now off Prednisone  - no signs of adrenal insufficiency  4. Osteoporosis  - I believe that he does have this, especially in the light of his fractures, despite osteopenia on DEXA scan - at last visit, we discussed about possibly starting medicines, however, he was coming off prednisone, stopped Dilantin, we were increasing his testosterone, so we decided to hold off adding antiresorptives. - I suggested we check another DEXA scan now, 2 years after the previous. If this is better, we might think about holding off anti-resorptives for now, however, if he has lower T-scores, I would suggest that we start Prolia. Also, if he restarts prednisone, we need to start osteoporosis medication. I recommended Prolia because he can be administered twice a year and is the most efficient of the anti-resorptive's. I do  not feel he is a candidate for teriparatide for now. I explained benefits and side effects and I advised him to finish his dental work before we start. I will get in touch with him after his DEXA scan and have him come back for a BMP so we can start Prolia at that time. We discussed that he finishes 3 years of Prolia, we need to continue with by mouth or IV bisphosphonate. He and his wife agree with the plan. - continue vitamin D repletion 4400 units daily - last 2 levels normal >> will recheck level today - he will return for regular appointment in 6 months but I will keep in touch with him through my chart  - time spent with the patient: 40 minutes, of which >50% was spent in reviewing her previous labs, evaluations, and treatments with him and his wife, counseling him about his conditions (please see the discussed topics above), and developing a plan to further treat and investigative. They had a number of questions which I addressed.  Office Visit on 10/04/2014  Component Date Value Ref Range Status  . VITD 10/04/2014 56.34  30.00 - 100.00 ng/mL Final  . Testosterone 10/04/2014 283* 300 - 890 ng/dL Final   Comment:           Tanner Stage       Male              Male               I              < 30 ng/dL        < 10 ng/dL               II             < 150 ng/dL       < 30 ng/dL               III            100-320 ng/dL     < 35 ng/dL               IV             200-970 ng/dL     15-40 ng/dL  V/Adult        300-890 ng/dL     10-70 ng/dL     . Sex Hormone Binding 10/04/2014 73  22 - 77 nmol/L Final   ** Please note change in reference range(s). **  . Testosterone, Free 10/04/2014 31.7* 47.0 - 244.0 pg/mL Final   Comment:   The concentration of free testosterone is derived from a mathematical expression based on constants for the binding of testosterone to sex hormone-binding globulin and albumin.   . Testosterone-% Free 10/04/2014 1.1* 1.6 - 2.9 % Final  . WBC  10/04/2014 4.2  4.0 - 10.5 K/uL Final  . RBC 10/04/2014 4.97  4.22 - 5.81 Mil/uL Final  . Platelets 10/04/2014 212.0  150.0 - 400.0 K/uL Final  . Hemoglobin 10/04/2014 15.7  13.0 - 17.0 g/dL Final  . HCT 10/04/2014 47.4  39.0 - 52.0 % Final  . MCV 10/04/2014 95.5  78.0 - 100.0 fl Final  . MCHC 10/04/2014 33.1  30.0 - 36.0 g/dL Final  . RDW 10/04/2014 13.9  11.5 - 15.5 % Final   Vitamin D, hemoglobin normal. Testosterone low, but he has not been completely compliant with his dosing. Will continue the same dose. Awaiting DEXA result.   Results: 11/10/2014  Lumbar spine (L1-L4) Femoral neck (FN)  T-score 0.1 RFN: -1.4 LFN: -1.2         Femoral necks scores worse. Due to his multiple fractures, even though he scores are in the osteopenic range, he has a diagnosis of osteoporosis. I would suggest we start Prolia. I will order a BMP.

## 2014-10-07 LAB — TESTOSTERONE, FREE, TOTAL, SHBG
SEX HORMONE BINDING: 73 nmol/L (ref 22–77)
Testosterone, Free: 31.7 pg/mL — ABNORMAL LOW (ref 47.0–244.0)
Testosterone-% Free: 1.1 % — ABNORMAL LOW (ref 1.6–2.9)
Testosterone: 283 ng/dL — ABNORMAL LOW (ref 300–890)

## 2014-11-08 ENCOUNTER — Ambulatory Visit (INDEPENDENT_AMBULATORY_CARE_PROVIDER_SITE_OTHER)
Admission: RE | Admit: 2014-11-08 | Discharge: 2014-11-08 | Disposition: A | Discharge status: Home / Self Care | Payer: Medicare Other | Source: Ambulatory Visit | Attending: Internal Medicine | Admitting: Internal Medicine

## 2014-11-08 DIAGNOSIS — M810 Age-related osteoporosis without current pathological fracture: Secondary | ICD-10-CM

## 2014-11-12 ENCOUNTER — Telehealth: Payer: Self-pay | Admitting: *Deleted

## 2014-11-12 NOTE — Addendum Note (Signed)
Addended by: Philemon Kingdom on: 11/12/2014 07:58 AM   Modules accepted: Orders

## 2014-11-15 ENCOUNTER — Encounter: Payer: Self-pay | Admitting: Internal Medicine

## 2014-11-15 NOTE — Telephone Encounter (Signed)
Opened encounter in error  

## 2014-11-29 ENCOUNTER — Other Ambulatory Visit (INDEPENDENT_AMBULATORY_CARE_PROVIDER_SITE_OTHER): Payer: Medicare Other

## 2014-11-29 DIAGNOSIS — M810 Age-related osteoporosis without current pathological fracture: Secondary | ICD-10-CM

## 2014-11-29 LAB — BASIC METABOLIC PANEL
BUN: 18 mg/dL (ref 6–23)
CHLORIDE: 106 meq/L (ref 96–112)
CO2: 31 meq/L (ref 19–32)
Calcium: 9.4 mg/dL (ref 8.4–10.5)
Creatinine, Ser: 0.89 mg/dL (ref 0.40–1.50)
GFR: 87.99 mL/min (ref >60.00)
Glucose, Bld: 90 mg/dL (ref 70–99)
Potassium: 4.5 mEq/L (ref 3.5–5.1)
Sodium: 141 mEq/L (ref 135–145)

## 2014-12-18 ENCOUNTER — Telehealth: Payer: Self-pay | Admitting: *Deleted

## 2014-12-18 ENCOUNTER — Encounter: Payer: Self-pay | Admitting: Internal Medicine

## 2014-12-18 NOTE — Telephone Encounter (Signed)
Did you initiate the PA for Prolia for patient?

## 2014-12-20 NOTE — Telephone Encounter (Signed)
Did you send an earlier request for Brandon Ray? I can't find one if you did.  But I just electronically sent his info for insurance verification and will notify you once I have a response. Thank you.

## 2014-12-25 ENCOUNTER — Other Ambulatory Visit: Payer: Self-pay | Admitting: *Deleted

## 2014-12-30 NOTE — Telephone Encounter (Signed)
I have rec'd pt's insurance verification for Prolia.  Prolia is subject to a 20% co-insurance.  If an OV is billed pt will have a $25 co-pay which includes the admin plus the 20% co-insurance, which means pt's estimated responsibility will be $205; if an OV is not billed pt will have a 20% co-insurance for the Prolia and a 20% co-insurance for the admin, which means pt's estimated responsibility will be $180.  Please let pt know this is only an estimate and we will not know an exact amt until his insurance has paid. I have sent a copy of the summary of benefits to be scanned into his chart.  If pt cannot afford $180-$205 for his injection, please advise him to contact Prolia at [phone] to see if he qualifies for one of their assistance programs.  If he qualifies they will instruct him how to proceed.  Please let me know if you have questions. Thank you.

## 2014-12-31 ENCOUNTER — Encounter: Payer: Self-pay | Admitting: Internal Medicine

## 2015-01-02 ENCOUNTER — Other Ambulatory Visit: Payer: Self-pay | Admitting: *Deleted

## 2015-01-02 MED ORDER — ANDROGEL 50 MG/5GM (1%) TD GEL: 5.0000 g | Freq: Every day | TRANSDERMAL | Status: DC

## 2015-01-02 NOTE — Telephone Encounter (Signed)
Called pt and advised him per Rose's message below. He voiced understanding and scheduled an appt to have Prolia inj on 4/19.

## 2015-01-07 ENCOUNTER — Ambulatory Visit (INDEPENDENT_AMBULATORY_CARE_PROVIDER_SITE_OTHER): Payer: Medicare Other

## 2015-01-07 DIAGNOSIS — M810 Age-related osteoporosis without current pathological fracture: Secondary | ICD-10-CM | POA: Diagnosis not present

## 2015-01-07 MED ORDER — DENOSUMAB 60 MG/ML ~~LOC~~ SOLN
60.0000 mg | Freq: Once | SUBCUTANEOUS | Status: AC
  Administered 2015-01-07: 60 mg via SUBCUTANEOUS

## 2015-01-18 ENCOUNTER — Encounter: Payer: Self-pay | Admitting: Internal Medicine

## 2015-01-21 ENCOUNTER — Telehealth: Payer: Self-pay | Admitting: *Deleted

## 2015-01-21 NOTE — Telephone Encounter (Signed)
PA was done for pt on 01/14/15. I did not hear from them, so I refaxed it on 01/17/15. The pt contacted [place] via MyChart stating that [employer] contacted him and stated that they denied his Androgel PA due to the fact that our office did not contact him back. I called them at [phone]. Spoke with a representative, they said that we completed a Non-formulary form for the Androgel (not the Androgel-Formulary PA form) and we never contacted them back. I told them that we did not get a call or a fax from them. The rep said that there is a 72 hour window to contact them back. I told them that we are a dr's office and part of that time ran into the weekend, not to mention the fact we did not get a call or a fax from them about the form. Rep stated there was nothing he could do about that. I asked them what med was on their formulary, he said that the Androgel is on the formulary, but it is a tier 3 and requires a PA. He said the generic testosterone gel is a tier 2, but also requires a PA. The will not let us complete another PA for the Androgel for 60 days after the denial. I was quite frustrated with the way they handled this. Please advise how to move forward.

## 2015-01-21 NOTE — Telephone Encounter (Signed)
I believe we have coupons for AndroGel 1.6%, can we give him this until approved by insurance?

## 2015-01-22 ENCOUNTER — Telehealth: Payer: Self-pay | Admitting: Internal Medicine

## 2015-01-22 NOTE — Telephone Encounter (Signed)
Spoke with pt. Advised pt per message below. Pt stated he is going to contact his ins co to find out how to handle this. Pt will call back.

## 2015-01-22 NOTE — Telephone Encounter (Signed)
Called pt. Response on other encounter.

## 2015-01-22 NOTE — Telephone Encounter (Signed)
Patient called wanting to speak with Larene Beach regarding a denial on his medication    Please advise

## 2015-01-23 ENCOUNTER — Other Ambulatory Visit: Payer: Self-pay | Admitting: Internal Medicine

## 2015-01-23 ENCOUNTER — Encounter: Payer: Self-pay | Admitting: Internal Medicine

## 2015-01-23 NOTE — Telephone Encounter (Signed)
Pt stated that he spoke with [employer] and they advised for Dr Cruzita Lederer to do an Expedited Appeal addressing the pt's issues for taking Androgel. Pt requests that Dr Cruzita Lederer please do an appeals letter addressing this for him to be able to receive the Androgel. Be advised.

## 2015-01-23 NOTE — Telephone Encounter (Signed)
I will do my best.

## 2015-01-24 DIAGNOSIS — M21371 Foot drop, right foot: Secondary | ICD-10-CM | POA: Insufficient documentation

## 2015-01-24 NOTE — Telephone Encounter (Signed)
Appeal letter and forms faxed to [facility] [phone]. Called pt and advised pt's wife that it was faxed to them this morning. She voiced understanding.

## 2015-01-27 NOTE — Telephone Encounter (Signed)
Patient would like talk to you about his medication,

## 2015-01-28 ENCOUNTER — Telehealth: Payer: Self-pay | Admitting: *Deleted

## 2015-01-28 NOTE — Telephone Encounter (Signed)
Called pt's pharmacy, [facility] and advised them that the pt's Androgel was approved.

## 2015-01-30 NOTE — Telephone Encounter (Signed)
Patient asked if you would call him. [phone]

## 2015-02-05 ENCOUNTER — Other Ambulatory Visit: Payer: Self-pay | Admitting: *Deleted

## 2015-02-05 MED ORDER — ANDROGEL 50 MG/5GM (1%) TD GEL: 5.0000 g | Freq: Every day | TRANSDERMAL | Status: DC

## 2015-02-05 NOTE — Telephone Encounter (Signed)
Pt sent message via MyChart. He would like rx for Androgel sent to [facility], 90 day supply. Ok per Dr Cruzita Lederer. Refill sent to [facility].

## 2015-03-27 ENCOUNTER — Telehealth: Payer: Self-pay | Admitting: Cardiology

## 2015-03-28 NOTE — Telephone Encounter (Signed)
Closed encounter °

## 2015-04-16 ENCOUNTER — Encounter: Payer: Self-pay | Admitting: Internal Medicine

## 2015-04-16 ENCOUNTER — Ambulatory Visit (INDEPENDENT_AMBULATORY_CARE_PROVIDER_SITE_OTHER): Payer: Medicare Other | Admitting: Internal Medicine

## 2015-04-16 VITALS — BP 124/62 | HR 85 | Temp 98.3°F | Resp 12 | Wt 206.0 lb

## 2015-04-16 DIAGNOSIS — N62 Hypertrophy of breast: Secondary | ICD-10-CM | POA: Diagnosis not present

## 2015-04-16 DIAGNOSIS — E291 Testicular hypofunction: Secondary | ICD-10-CM

## 2015-04-16 DIAGNOSIS — M810 Age-related osteoporosis without current pathological fracture: Secondary | ICD-10-CM | POA: Diagnosis not present

## 2015-04-16 LAB — PSA: PSA: 0.35 ng/mL (ref 0.10–4.00)

## 2015-04-16 LAB — VITAMIN D 25 HYDROXY (VIT D DEFICIENCY, FRACTURES): VITD: 64.27 ng/mL (ref 30.00–100.00)

## 2015-04-16 NOTE — Patient Instructions (Signed)
Please stop at the lab.  Please continue testosterone 5 g a day 5/7 days.  Continue vitamin D 4400 units daily.  Please come back for a follow-up appointment in 6 months

## 2015-04-16 NOTE — Progress Notes (Signed)
Subjective:     Patient ID: Brandon Ray, male   DOB: 02/01/1935, 80 y.o.   MRN: [id number]  HPI Brandon Ray is a very pleasant 80 y.o. man with h/o severe polyneuropathy and monoclonal gammopathy, returning for f/u for primary hypogonadism, osteoporosis/h/o fracture, and long-term steroid use. Last visit 6 mo ago.   He has severe neuropathy >> He now cannot feel when he urinates >> urinary rehab by his urologist >> still slow to start stream. He still has discomfort in the mid thoracic region, pelvic region and his hands.  Reviewed hx: He was previously investigated for POEMS sd. - prev. Seen by Dr Brandon Ray, then had an evaluation by Dr. Karie Ray. Brandon Ray (Heme/Onc at [facility]) for this, but it was concluded that he actually has MGUS and will be just followed for it. He also had a fat pad biopsy to rule out amyloidosis and this was negative.   Please see my previous notes regarding patient's past medical history. He does have polyneuropathy, monoclonal gammopathy, gynecomastia and primary hypogonadism, but no organomegaly or significant skin changes. He also sees Dr. Tessa Ray in pain clinic,  Dr Brandon Ray (neurology) at the [facility]. Dr. Rushie Ray is his local neurologist ([facility]). 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   He is now seeing Pain management at [facility] >> Neurontin increased from 2400 to 3600 mg a day >> did not work >> back down but to 3000 mg daily.  Patient also has primary hypogonadism. We started testosterone gel at 5 grams daily, then we increased the dose to 7.5 g daily, which he could not tolerate well: his libido increased, felt tension in his pelvis and also felt that his breasts were more tense. He went back to the 5g daily, then we had to decrease further to 5 mg 5/7 days. He continues this dose now. At last visit, testosterone level was too low as he missed doses when his grandchildren were visiting over the [place].  Component     Latest Ref Rng 09/28/2012  02/09/2013 02/09/2013 09/14/2013          8:54 AM  8:54 AM   Testosterone     300 - 890 ng/dL 18.63 (L) 217.23 (L) 224.92 (L) 800  Sex Hormone Binding     22 - 77 nmol/L 66 64  76 (H)  Testosterone Free     47.0 - 244.0 pg/mL 2.1 (L) 26.5 (L)  100.1  Testosterone-% Free     1.6 - 2.9 % 1.1 (L) 1.2 (L)  1.3 (L)   Component     Latest Ref Rng 03/13/2014 10/04/2014           Testosterone     300 - 890 ng/dL 1002 (H) 283 (L)  Sex Hormone Binding     22 - 77 nmol/L 65 73  Testosterone Free     47.0 - 244.0 pg/mL 151.6 31.7 (L)  Testosterone-% Free     1.6 - 2.9 % 1.5 (L) 1.1 (L)   PSA: Component     Latest Ref Rng 02/09/2013 03/13/2014  PSA     0.10 - 4.00 ng/mL 0.15 0.48   Last DRE: 08/2014 Last Hb/HT normal, however, his hemoglobin was at the upper limit of normal: 04/15/2015: Hb 16.4 (13.7-17.3)/HT 50.2 (39-49) Lab Results  Component Value Date   WBC 4.2 10/04/2014   HGB 15.7 10/04/2014   HCT 47.4 10/04/2014   MCV 95.5 10/04/2014   PLT 212.0 10/04/2014   Patient also  has gynecomastia, with no evidence of malignancy on a bilateral mammogram. This is probably due to his primary hypogonadism, but he does not feel much improved after starting testosterone. His prolactin was normal. As mentioned above, he has dysesthesia at the level of his nipples, which is probably related to his neuropathy.  Patient has been using high-dose steroids for his neuropathy. He was initially started at 60 mg in 07/2012, and this dose was tapered down >> He stopped Prednisone around 09/2013. He stayed off Prednisone since then.    Patient with low bone mineral density, but not in the range for osteoporosis, however with a sacral fracture and also a more recent right big toe fracture (04/2013): likely 2/2 steroid tx.  DEXA 11/06/2012: L1-L4 T score of -0.9 and dual femur T score of -1.1  At last visit, we repeated a DEXA: DEXA 11/10/2014: L1-L4 T score of +0.1 and right femoral neck -1.4, left  femoral neck -1.2  Femoral necks scores were worse. Due to his multiple fractures, even though he scores were in the osteopenic range, he has a diagnosis of osteoporosis. We started Prolia >> had 1 injection.  He takes vit D 4400 IU  + Calcium 600 mg daily.   Review of Systems - see above also Constitutional: + weight gain, no fatigue, no subjective hyperthermia, + nocturia Eyes: no more blurry vision(had cataract sx), no xerophthalmia ENT: no sore throat, no nodules palpated in throat, no dysphagia/odynophagia, no hoarseness Cardiovascular: no CP/SOB/palpitations/+ leg swelling Respiratory: no cough/SOB Gastrointestinal: no N/V/D/C Musculoskeletal: no muscle/joint aches.  Skin: no rashes, + easy bruising I reviewed pt's medications, allergies, PMH, social hx, family hx, and changes were documented in the history of present illness. Otherwise, unchanged from my initial visit note.  Objective:   Physical Exam BP 124/62 mmHg  Pulse 85  Temp(Src) 98.3 F (36.8 C) (Oral)  Resp 12  Wt 206 lb (93.441 kg)  SpO2 95% Wt Readings from Last 3 Encounters:  04/16/15 206 lb (93.441 kg)  10/04/14 199 lb (90.266 kg)  07/15/14 195 lb (88.451 kg)   Weight higher 2/2 boot  Constitutional: overweight, in NAD, difficult to move to examiner's table or to stand. Walks with a cane. Eyes: PERRLA, EOMI, no exophthalmos ENT: moist mucous membranes, no thyromegaly, no cervical lymphadenopathy Cardiovascular: RRR, No MRG Respiratory: CTA B Gastrointestinal: abdomen soft, NT, ND, BS+ Musculoskeletal: kyphosis. Skin: moist, warm Neurological: mild tremor with outstretched hands, no DTRs detected  Assessment:     1. Primary hypogonadism - + gynecomastia bilaterally - per mammography - + testicular atrophy - started testosterone gel >> a sensation of pressure in abdomen, but this did not subside after stopping testosterone gel for 1 mo after his surgery for Transurethral resection of prostate residual  growth in 12/22/2012. Restarted at the end of 12/2012. - again increased tension in lower abdomen especially after increasing the dose of testosterone to 7.5 g daily at the end of 01/2013, but persisting on 5 mg daily or 5 mg 5 out of 7 days   2. Osteoporosis - h/o sacral fracture, right big toe fracture (04/2013) - Off steroids now - spine X-ray: no vb fx - on adequate Ca+vit D, vit D levels normal  3. Long-term use of steroids - previously on prednisone 60 mg starting at the end of 2013 - Was able to taper >> now off since 09/2013  4. Polyneuropathy - unlikely POEMS syndrome per evaluation by Hem/Onc at [facility] - amyloidosis ruled out by negative fat  pad biopsy - managed by Dr. Trinna Ray at [facility] and Dr. Earle Gell at [facility]   5. MGUS - monoclonal gammopathy - now managed by Dr[facility]Tuchman at [facility], previously Dr. Lamonte Ray    Plan:     1. Primary hypogonadism - Patient continues his Androgel 5 g approximately 5 out of 7 days. Over Christmas, when his children were visiting, he had a period in which she did not take it >> total testosterone level was low >> we did continue the same dose >> will recheck today.  He still has  pelvic pressure (at this point, he believes this is not related to the testosterone tx). He also continues to have tension in his breasts - He is up-to-date with DRE's per his urologist, Dr. Junious Silk - we will check testosterone level today and a PSA. Per last CBC (04/15/2015, [facility]), since his last hemoglobin was close to the upper limit of normal, while HT slightly above ULN. We decided that for now, we should continue the testosterone at the same dose. We will need to repeat his CBC at next visit. - Continue AndroGel 1%  5 g 5 out of 7 days  2. Gynecomastia  - still feels tension in breasts, likely 2/2 neuropathy  3. Long-term use of steroids - He is now off Prednisone  - no signs of adrenal insufficiency  4. Osteoporosis  - Osteoporosis, rather than osteopenia  (osteopenia on DEXA scan) - He is now off prednisone completely, stopped Dilantin, on testosterone replacement and also on Prolia. No recent fx's. - will check a vit D level now and another DEXA scan in 2 years after the previous.We have discussed that he finishes 3 years of Prolia, we need to continue with po or IV bisphosphonate.  - continue vitamin D repletion 4400 units daily - will recheck level today - he will return for regular appointment in 6 months but I will keep in touch with him through my chart  Office Visit on 04/16/2015  Component Date Value Ref Range Status  . Testosterone 04/16/2015 553  300 - 890 ng/dL Final   Comment:           Tanner Stage       Male              Male               I              < 30 ng/dL        < 10 ng/dL               II             < 150 ng/dL       < 30 ng/dL               III            100-320 ng/dL     < 35 ng/dL               IV             200-970 ng/dL     15-40 ng/dL               V/Adult        300-890 ng/dL     10-70 ng/dL     . Sex Hormone Binding 04/16/2015 46  22 - 77 nmol/L Final  . Testosterone, Free 04/16/2015 95.3  47.0 - 244.0  pg/mL Final   Comment:   The concentration of free testosterone is derived from a mathematical expression based on constants for the binding of testosterone to sex hormone-binding globulin and albumin.   . Testosterone-% Free 04/16/2015 1.7  1.6 - 2.9 % Final  . PSA 04/16/2015 0.35  0.10 - 4.00 ng/mL Final  . VITD 04/16/2015 64.27  30.00 - 100.00 ng/mL Final   Great labs! Continue current doses of testosterone and vit D.

## 2015-04-17 LAB — TESTOSTERONE, FREE, TOTAL, SHBG
Sex Hormone Binding: 46 nmol/L (ref 22–77)
TESTOSTERONE-% FREE: 1.7 % (ref 1.6–2.9)
Testosterone, Free: 95.3 pg/mL (ref 47.0–244.0)
Testosterone: 553 ng/dL (ref 300–890)

## 2015-06-13 ENCOUNTER — Telehealth: Payer: Self-pay | Admitting: *Deleted

## 2015-06-13 NOTE — Telephone Encounter (Signed)
Rose, will you initiate a PA for Prolia for pt? Thank you.

## 2015-06-23 NOTE — Telephone Encounter (Signed)
I have electronically submitted pt's info for Prolia insurance verification and will notify you once I have a response. Thank you. °

## 2015-06-30 NOTE — Telephone Encounter (Signed)
I have rec'd [name]' insurance verification for Prolia and w/out an OV he will have an estimated $195 co-insurance.  With an OV he will have an additional $50 co-pay for an estimated total of $245.  Please make him aware this is only an estimate and we will not know an exact amt until insurance has paid.  I have sent a copy of the summary of benefits to scan into his chart.  If Brandon Ray cannot afford $352-$481 for his injection, please advise him to call Prolia at [phone] and select option #1 to see if she qualifies for one of their assistance programs.  If she qualifies they will instruct her how to proceed.  If you have any questions, please let me know. Thank you.

## 2015-07-03 ENCOUNTER — Telehealth: Payer: Self-pay | Admitting: Internal Medicine

## 2015-07-03 DIAGNOSIS — G6289 Other specified polyneuropathies: Secondary | ICD-10-CM | POA: Insufficient documentation

## 2015-07-03 NOTE — Telephone Encounter (Signed)
Please call Mr. Vandunk regarding his Prolia injection     Thank you

## 2015-07-04 NOTE — Telephone Encounter (Signed)
Prolia inj scheduled.

## 2015-07-04 NOTE — Telephone Encounter (Signed)
Called pt and advised him per ins co-pay. Pt voiced he would like to schedule. Pt scheduled Prolia inj, for 07/10/15 at 3:30 pm.

## 2015-07-10 ENCOUNTER — Other Ambulatory Visit (INDEPENDENT_AMBULATORY_CARE_PROVIDER_SITE_OTHER): Payer: Medicare Other | Admitting: *Deleted

## 2015-07-10 ENCOUNTER — Ambulatory Visit (INDEPENDENT_AMBULATORY_CARE_PROVIDER_SITE_OTHER): Payer: Medicare Other | Admitting: *Deleted

## 2015-07-10 DIAGNOSIS — M810 Age-related osteoporosis without current pathological fracture: Secondary | ICD-10-CM | POA: Diagnosis not present

## 2015-07-10 MED ORDER — DENOSUMAB 60 MG/ML ~~LOC~~ SOLN
60.0000 mg | Freq: Once | SUBCUTANEOUS | Status: AC
  Administered 2015-07-10: 60 mg via SUBCUTANEOUS

## 2015-08-07 ENCOUNTER — Encounter: Payer: Self-pay | Admitting: Cardiology

## 2015-08-07 ENCOUNTER — Ambulatory Visit (INDEPENDENT_AMBULATORY_CARE_PROVIDER_SITE_OTHER): Payer: Medicare Other | Admitting: Cardiology

## 2015-08-07 VITALS — BP 130/72 | HR 73 | Ht 73.0 in | Wt 202.0 lb

## 2015-08-07 DIAGNOSIS — I25810 Atherosclerosis of coronary artery bypass graft(s) without angina pectoris: Secondary | ICD-10-CM | POA: Diagnosis not present

## 2015-08-07 DIAGNOSIS — E785 Hyperlipidemia, unspecified: Secondary | ICD-10-CM

## 2015-08-07 LAB — HEPATIC FUNCTION PANEL
ALBUMIN: 4.2 g/dL (ref 3.6–5.1)
ALT: 31 U/L (ref 9–46)
AST: 24 U/L (ref 10–35)
Alkaline Phosphatase: 81 U/L (ref 40–115)
BILIRUBIN TOTAL: 0.6 mg/dL (ref 0.2–1.2)
Bilirubin, Direct: 0.1 mg/dL (ref <=0.2)
Indirect Bilirubin: 0.5 mg/dL (ref 0.2–1.2)
Total Protein: 6.3 g/dL (ref 6.1–8.1)

## 2015-08-07 LAB — BASIC METABOLIC PANEL
BUN: 11 mg/dL (ref 7–25)
CO2: 28 mmol/L (ref 20–31)
CREATININE: 0.88 mg/dL (ref 0.70–1.18)
Calcium: 9.1 mg/dL (ref 8.6–10.3)
Chloride: 102 mmol/L (ref 98–110)
Glucose, Bld: 87 mg/dL (ref 65–99)
Potassium: 4.5 mmol/L (ref 3.5–5.3)
Sodium: 139 mmol/L (ref 135–146)

## 2015-08-07 LAB — LIPID PANEL
CHOL/HDL RATIO: 2.2 ratio (ref <=5.0)
Cholesterol: 144 mg/dL (ref 125–200)
HDL: 65 mg/dL (ref >=40)
LDL CALC: 68 mg/dL (ref <130)
Triglycerides: 55 mg/dL (ref <150)
VLDL: 11 mg/dL (ref <30)

## 2015-08-07 MED ORDER — ATORVASTATIN CALCIUM 20 MG PO TABS: ORAL_TABLET | ORAL | Status: DC

## 2015-08-07 NOTE — Progress Notes (Signed)
Brandon Ray Date of Birth: Nov 25, 1934   History of Present Illness: [name] is seen today for followup. He has a history of coronary disease and is status post CABG in 2004 after a NSTEMI. He had severe 3 vessel disease and normal LV function. He is still battling with severe polyneuropathy felt to be related to a vaccine. This has significantly limited his activity. He is no longer able to hike. He really denies any significant cardiac complaints. He denies chest pain, shortness of breath, or palpitations. He has no edema. His goal is to get back to walking 2 miles. Now able to walk 1.5 miles. Reports being treated for PNA 2 months ago.   Current Outpatient Prescriptions on File Prior to Visit  Medication Sig Dispense Refill  . ANDROGEL 50 MG/5GM (1%) GEL Place 5 g onto the skin daily. 90 Package 1  . aspirin 81 MG tablet Take 81 mg by mouth daily.     . calcium-vitamin D (OSCAL WITH D) 500-200 MG-UNIT per tablet Take 1 tablet by mouth daily.      . Cholecalciferol 2000 UNITS TBDP Take 4,000 Units by mouth daily.    Marland Kitchen gabapentin (NEURONTIN) 100 MG capsule Take 300 mg by mouth 5 (five) times daily.     . Omega-3 Fatty Acids (FISH OIL) 1200 MG CAPS Take 1,200 mg by mouth daily.    Marland Kitchen PHENobarbital (LUMINAL) 97.2 MG tablet Take 97.2 mg by mouth daily.      . polyethylene glycol (MIRALAX / GLYCOLAX) packet Take 17 g by mouth daily.     No current facility-administered medications on file prior to visit.    Allergies  Allergen Reactions  . Boostrix [Tetanus-Diphth-Acell Pertussis] Other (See Comments)    Neuropathy symptoms  . Other Other (See Comments)    Flu shot-neuropathy symptoms  . Amoxicillin Rash    Upper torso only.    Past Medical History  Diagnosis Date  . Coronary artery disease 01/2003    CABG  . Hyperlipidemia   . Weakness 2013-May began    lower extemities  . Pleural effusion 05/2011    from abdominal surgery  . Small bowel obstruction ([place]) 05/2011   due to adhesion  . Free monoclonal light chain     urine  . Peripheral neuropathy ([place]) 05/2011     follow up with Dr. Tish Ray multiple issues  . Seizures ([place]) 1950's    last one 1960  . BPH (benign prostatic hypertrophy) 2014    Past Surgical History  Procedure Laterality Date  . Cardiac catheterization  01/27/2003    NORMAL LEFT VENTRICULAR SIZE WITH MILD FOCAL LATERAL WALL HYPOKINESIA. EF 60%  . Coronary artery bypass graft  2004    LIMA GRAFT TO THE LAD, SAPHENOUS VEIN GRAFT TO THE DIAGONAL, SAPHENOUS VEIN GRAFT TO THE FIRST OM, SAPHENOUS VEIN GRAFT TO THE PDA  . Transurethral resection of prostate  1992  . Hand surgery  2000    left  . Abdominal surgery  05/2011    bowel obstruction  . Ankle fracture surgery  1997    left  . Transurethral resection of prostate N/A 12/22/2012    Procedure: TRANSURETHRAL RESECTION OF THE PROSTATE WITH GYRUS (TURP);  Surgeon: Brandon Bonine, MD;  Location: [facility];  Service: Urology;  Laterality: N/A;    History  Smoking status  . Former Smoker -- 1.00 packs/day for 5 years  . Types: Cigarettes  . Quit date: 07/01/1965  Smokeless tobacco  .  Never Used    History  Alcohol Use No    Family History  Problem Relation Age of Onset  . Asthma Mother   . Heart disease Mother 70  . Coronary artery disease Father   . Heart disease Father   . Cancer Sister 61    breast    Review of Systems: As noted in history of present illness.  All other systems were reviewed and are negative.  Physical Exam: BP 130/72 mmHg  Pulse 73  Ht $R'6\' 1"'KB$  (1.854 m)  Wt 91.627 kg (202 lb)  BMI 26.66 kg/m2 He is a pleasant white male in no acute distress. HEENT exam is unremarkable. No JVD or bruits. Lungs are clear. Cardiac exam reveals a regular rate and rhythm without gallop or murmur. Abdomen is soft and nontender without mass or bruits. Legs are without edema. Pedal pulses are good. He does have a foot drop on the right. He is  wearing an orthopedic shoe and brace on his right ankle. He walks with a cane. He is alert and oriented x3. Cranial nerves II through XII are intact  LABORATORY DATA: Ecg today shows NSR with an incomplete RBBB. No change.  I have personally reviewed and interpreted this study.   Assessment / Plan: 1. Coronary disease status post CABG in 2004. His last nuclear stress test in May of 2011 was normal. We will continue with his medical management and risk factor modification. He is asymptomatic.   2. Hyperlipidemia. He remains on statin therapy. We will arrange for fasting lab work today.   3. PolyNeuropathy.  I will follow up in one year.

## 2015-08-07 NOTE — Patient Instructions (Signed)
Continue your current therapy  We will check blood work today  I will see you in 1 year

## 2015-09-23 DIAGNOSIS — M1711 Unilateral primary osteoarthritis, right knee: Secondary | ICD-10-CM | POA: Diagnosis not present

## 2015-10-15 DIAGNOSIS — I2510 Atherosclerotic heart disease of native coronary artery without angina pectoris: Secondary | ICD-10-CM | POA: Diagnosis not present

## 2015-10-15 DIAGNOSIS — E291 Testicular hypofunction: Secondary | ICD-10-CM | POA: Diagnosis not present

## 2015-10-15 DIAGNOSIS — G629 Polyneuropathy, unspecified: Secondary | ICD-10-CM | POA: Diagnosis not present

## 2015-10-15 DIAGNOSIS — D472 Monoclonal gammopathy: Secondary | ICD-10-CM | POA: Diagnosis not present

## 2015-10-15 DIAGNOSIS — G6181 Chronic inflammatory demyelinating polyneuritis: Secondary | ICD-10-CM | POA: Diagnosis not present

## 2015-10-15 DIAGNOSIS — N62 Hypertrophy of breast: Secondary | ICD-10-CM | POA: Diagnosis not present

## 2015-10-15 DIAGNOSIS — M810 Age-related osteoporosis without current pathological fracture: Secondary | ICD-10-CM | POA: Diagnosis not present

## 2015-10-15 DIAGNOSIS — Z7982 Long term (current) use of aspirin: Secondary | ICD-10-CM | POA: Diagnosis not present

## 2015-10-15 DIAGNOSIS — Z79899 Other long term (current) drug therapy: Secondary | ICD-10-CM | POA: Diagnosis not present

## 2015-10-16 DIAGNOSIS — M1711 Unilateral primary osteoarthritis, right knee: Secondary | ICD-10-CM | POA: Diagnosis not present

## 2015-10-20 ENCOUNTER — Encounter: Payer: Self-pay | Admitting: Internal Medicine

## 2015-10-20 ENCOUNTER — Ambulatory Visit (INDEPENDENT_AMBULATORY_CARE_PROVIDER_SITE_OTHER): Payer: Medicare HMO | Admitting: Internal Medicine

## 2015-10-20 VITALS — BP 114/68 | HR 75 | Temp 98.4°F | Resp 12 | Wt 204.6 lb

## 2015-10-20 DIAGNOSIS — N62 Hypertrophy of breast: Secondary | ICD-10-CM | POA: Diagnosis not present

## 2015-10-20 DIAGNOSIS — R209 Unspecified disturbances of skin sensation: Secondary | ICD-10-CM

## 2015-10-20 DIAGNOSIS — N400 Enlarged prostate without lower urinary tract symptoms: Secondary | ICD-10-CM | POA: Diagnosis not present

## 2015-10-20 DIAGNOSIS — R208 Other disturbances of skin sensation: Secondary | ICD-10-CM | POA: Diagnosis not present

## 2015-10-20 DIAGNOSIS — M810 Age-related osteoporosis without current pathological fracture: Secondary | ICD-10-CM | POA: Diagnosis not present

## 2015-10-20 DIAGNOSIS — E291 Testicular hypofunction: Secondary | ICD-10-CM

## 2015-10-20 LAB — TSH: TSH: 2.41 u[IU]/mL (ref 0.35–4.50)

## 2015-10-20 LAB — VITAMIN D 25 HYDROXY (VIT D DEFICIENCY, FRACTURES): VITD: 54.39 ng/mL (ref 30.00–100.00)

## 2015-10-20 LAB — PSA: PSA: 0.3 ng/mL (ref 0.10–4.00)

## 2015-10-20 MED ORDER — ANDROGEL 50 MG/5GM (1%) TD GEL: 5.0000 g | Freq: Every day | TRANSDERMAL | Status: DC

## 2015-10-20 NOTE — Patient Instructions (Signed)
Please stop at the lab.  Continue current testosterone dose.  Let's also continue Prolia.  I will let you know if we need to adjust your vitamin D dose.  Please return in 1 year.

## 2015-10-20 NOTE — Progress Notes (Signed)
Subjective:     Patient ID: Brandon Ray, male   DOB: [birth year], 81 y.o.   MRN: [id number]  HPI Brandon Ray is a very pleasant 81 y.o. man with h/o severe polyneuropathy and monoclonal gammopathy, returning for f/u for primary hypogonadism, osteoporosis/h/o fracture, and long-term steroid use. Last visit 6 mo ago.   He has pain in R knee >> will have an MRI soon.  Reviewed hx: He has severe neuropathy:  >> He now cannot feel when he urinates >> urinary rehab by his urologist >> still slow to start stream. He still has discomfort in the mid thoracic region, pelvic region and his hands.  He was previously investigated for POEMS sd. - prev. Seen by Dr Lamonte Sakai, then had an evaluation by Dr. Karie Kirks. Tuchman (Heme/Onc at [facility]) for this, but it was concluded that he actually has MGUS and will be just followed for it. He also had a fat pad biopsy to rule out amyloidosis and this was negative.   Please see my previous notes regarding patient's past medical history. He does have polyneuropathy, monoclonal gammopathy, gynecomastia and primary hypogonadism, but no organomegaly or significant skin changes. He also sees Dr. Tessa Lerner in pain clinic,  Dr Trinna Post (neurology) at the [facility]. Dr. Rushie Goltz is his local neurologist ([facility]). 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   He is on Neurontin high doses >> not helping much >> will see the Pain Clinic at [facility].  Patient also has primary hypogonadism. We started testosterone gel at 5 grams daily, then we increased the dose to 7.5 g daily, which he could not tolerate well: his libido increased, felt tension in his pelvis and also felt that his breasts were more tense. He went back to the 5g daily, then we had to decrease further to 5 mg 5/7 days. He continues this dose now.   At last visit, testosterone level (+ free Testosterone) normal:  Component     Latest Ref Rng 10/04/2014 04/16/2015  Testosterone     300 - 890 ng/dL 283 (L)  553  Sex Hormone Binding     22 - 77 nmol/L 73 46  Testosterone Free     47.0 - 244.0 pg/mL 31.7 (L) 95.3  Testosterone-% Free     1.6 - 2.9 % 1.1 (L) 1.7   Reviewed pertinent labs: Lab Results  Component Value Date   TESTOSTERONE 553 04/16/2015   TESTOSTERONE 283* 10/04/2014   TESTOSTERONE 1002* 03/13/2014   TESTOSTERONE 800 09/14/2013   TESTOSTERONE 224.92* 02/09/2013   TESTOSTERONE 217.23* 02/09/2013   PSA 0.35 04/16/2015   PSA 0.48 03/13/2014   PSA 0.15 02/09/2013   Last Hb/HT normal: 10/15/2015: Hb 15.8/HT 47.8 04/15/2015: Hb 16.4 (13.7-17.3)/HT 50.2 (39-49) Lab Results  Component Value Date   WBC 4.2 10/04/2014   HGB 15.7 10/04/2014   HCT 47.4 10/04/2014   MCV 95.5 10/04/2014   PLT 212.0 10/04/2014  Last DRE: 11/2013  Patient also has gynecomastia, with no evidence of malignancy on a bilateral mammogram. This is probably due to his primary hypogonadism, but he does not feel much improved after starting testosterone. His prolactin was normal. He has dysesthesia at the level of his nipples, which is probably related to his neuropathy.  Patient has been using high-dose steroids for his neuropathy. He was initially started at 60 mg in 07/2012, and this dose was tapered down >> He stopped Prednisone around 09/2013. He stayed off Prednisone since then.    Patient with  low bone mineral density, but not in the range for osteoporosis, however with a sacral fracture and also a right big toe fracture (04/2013): likely 2/2 steroid tx.  DEXA 11/06/2012: L1-L4 T score of -0.9 and dual femur T score of -1.1 DEXA 11/10/2014: L1-L4 T score of +0.1 and right femoral neck -1.4, left femoral neck -1.2  Femoral necks scores were worse. Due to his multiple fractures, even though he scores were in the osteopenic range, he has a diagnosis of osteoporosis. We started Prolia >> had 2 injection.  He takes vit D 5000 IU (prev. 4400)  + Calcium 600 mg daily.  Last vitamin D was  normal: Component     Latest Ref Rng 10/04/2014 04/16/2015  VITD     30.00 - 100.00 ng/mL 56.34 64.27   Review of Systems - see above also Constitutional: + weight gain, no fatigue, no subjective hyperthermia, + nocturia Eyes: no more blurry vision(had cataract sx), no xerophthalmia ENT: no sore throat, no nodules palpated in throat, no dysphagia/odynophagia, no hoarseness Cardiovascular: no CP/SOB/palpitations/+ leg swelling Respiratory: no cough/SOB Gastrointestinal: no N/V/D/C Musculoskeletal: no muscle/+ joint aches.  Skin: no rashes, + easy bruising  I reviewed pt's medications, allergies, PMH, social hx, family hx, and changes were documented in the history of present illness. Otherwise, unchanged from my initial visit note.  Objective:   Physical Exam BP 114/68 mmHg  Pulse 75  Temp(Src) 98.4 F (36.9 C) (Oral)  Resp 12  Wt 204 lb 9.6 oz (92.806 kg)  SpO2 97% Body mass index is 27 kg/(m^2). Wt Readings from Last 3 Encounters:  10/20/15 204 lb 9.6 oz (92.806 kg)  08/07/15 202 lb (91.627 kg)  04/16/15 206 lb (93.441 kg)   Weight higher 2/2 boot  Constitutional: overweight, in NAD, difficult to move. Walks with a cane. Eyes: PERRLA, EOMI, no exophthalmos ENT: moist mucous membranes, no thyromegaly, no cervical lymphadenopathy Cardiovascular: RRR, No MRG Respiratory: CTA B Gastrointestinal: abdomen soft, NT, ND, BS+ Musculoskeletal: kyphosis. Skin: moist, warm Neurological: mild tremor with outstretched hands, no DTRs detected  Assessment:     1. Primary hypogonadism - + gynecomastia bilaterally - per mammography - + testicular atrophy - started testosterone gel >> a sensation of pressure in abdomen, but this did not subside after stopping testosterone gel for 1 mo after his surgery for Transurethral resection of prostate residual growth in 12/22/2012. Restarted at the end of 12/2012. - again increased tension in lower abdomen especially after increasing the dose of  testosterone to 7.5 g daily at the end of 01/2013, but persisting on 5 mg daily or 5 mg 5 out of 7 days   2. Osteoporosis - h/o sacral fracture, right big toe fracture (04/2013) - Off steroids now - spine X-ray: no vb fx - on adequate Ca+vit D, vit D levels normal - On Prolia 2  3. Long-term use of steroids - previously on prednisone 60 mg starting at the end of 2013 - Was able to taper >> now off since 09/2013 - No signs of adrenal insufficiency  4. Polyneuropathy - unlikely POEMS syndrome per evaluation by Hem/Onc at [facility] - amyloidosis ruled out by negative fat pad biopsy - managed by Dr. Trinna Post at [facility] and Dr. Earle Gell at [facility]   5. MGUS - monoclonal gammopathy - now managed by Dr.Tuchman at [facility], previously Dr. Lamonte Sakai    Plan:     1. Primary hypogonadism - Patient continues his Androgel 5 g approximately 5 out of 7 days. He has  periods of time, when his children were visiting, when he does not take it, for approximately 1 week at a time, however he then takes it daily for an entire week. - We reviewed together his last testosterone level, which was perfect >> will recheck today.  He still has  pelvic pressure (unclear if related to the testosterone tx, since he had this even when he will stop the testosterone in the past). He also continues to have tension in his breasts - He is up-to-date with DRE's per his urologist, Dr. Junious Silk - we will check testosterone level today and a PSA. Reviewed last CBC (09/2015, [facility]) >> normal Hb, HT >> continue the testosterone at the same dose.  - Continue AndroGel 1%  5 g 5 out of 7 days  2. Osteoporosis  - Osteoporosis, rather than osteopenia (osteopenia on DEXA scan) - He is now off prednisone completely, stopped Dilantin, on testosterone replacement and also on Prolia. No recent fx's. - will check another DEXA scan in 2 years after the previous (in 1 year from now).We have discussed that he finishes 3-6 years of Prolia, we need to  continue with po or IV bisphosphonate x 1, at least.  - continue vitamin D repletion 5000 units daily - will recheck level today - will schedule the next appointment in 1 year  3. Feeling cold - Will check a TSH  CC: dRS. Jacklynn Barnacle  Office Visit on 10/20/2015  Component Date Value Ref Range Status  . PSA 10/20/2015 0.30  0.10 - 4.00 ng/mL Final  . VITD 10/20/2015 54.39  30.00 - 100.00 ng/mL Final  . TSH 10/20/2015 2.41  0.35 - 4.50 uIU/mL Final  Normal results.  Component     Latest Ref Rng 10/20/2015  Testosterone     250 - 827 ng/dL 251  Sex Hormone Binding     22 - 77 nmol/L 55  Testosterone Free     47.0 - 244.0 pg/mL 34.6 (L)  Testosterone-% Free     1.6 - 2.9 % 1.4 (L)   Free Testosterone level is again low, which I think may be because of the missed doses. I advised the patient to stay on the same testosterone dose of 5 g every other day for now.

## 2015-10-21 LAB — TESTOSTERONE, FREE, TOTAL, SHBG
SEX HORMONE BINDING: 55 nmol/L (ref 22–77)
TESTOSTERONE-% FREE: 1.4 % — AB (ref 1.6–2.9)
TESTOSTERONE: 251 ng/dL (ref 250–827)
Testosterone, Free: 34.6 pg/mL — ABNORMAL LOW (ref 47.0–244.0)

## 2015-10-22 ENCOUNTER — Telehealth: Payer: Self-pay | Admitting: Internal Medicine

## 2015-10-22 NOTE — Telephone Encounter (Signed)
Patient called and would like to speak with Larene Beach   Please advise    Thank you

## 2015-10-24 DIAGNOSIS — M1711 Unilateral primary osteoarthritis, right knee: Secondary | ICD-10-CM | POA: Diagnosis not present

## 2015-10-27 NOTE — Telephone Encounter (Signed)
Called pt and advised him that we completed the PA and his ins approved his Testosterone. Advised him that I sent the approval to the pharmacy. Pt voiced understanding.

## 2015-10-30 DIAGNOSIS — S83241A Other tear of medial meniscus, current injury, right knee, initial encounter: Secondary | ICD-10-CM | POA: Diagnosis not present

## 2015-11-03 NOTE — Telephone Encounter (Signed)
Received surgical clearance from [facility].Dr.Jordan cleared patient for surgery.Form faxed back to fax # [phone].

## 2015-11-05 ENCOUNTER — Encounter: Payer: Self-pay | Admitting: Internal Medicine

## 2015-11-10 ENCOUNTER — Telehealth: Payer: Self-pay | Admitting: *Deleted

## 2015-11-10 NOTE — Telephone Encounter (Signed)
Rose, will you please initiate a PA for pt's Prolia inj? Thank you!

## 2015-11-13 DIAGNOSIS — S83241D Other tear of medial meniscus, current injury, right knee, subsequent encounter: Secondary | ICD-10-CM | POA: Diagnosis not present

## 2015-11-17 DIAGNOSIS — J209 Acute bronchitis, unspecified: Secondary | ICD-10-CM | POA: Diagnosis not present

## 2015-11-25 NOTE — Telephone Encounter (Signed)
Rose, will you please initiate a PA for pt's Prolia inj? Thank you!

## 2015-12-03 DIAGNOSIS — H01001 Unspecified blepharitis right upper eyelid: Secondary | ICD-10-CM | POA: Diagnosis not present

## 2015-12-03 DIAGNOSIS — H01004 Unspecified blepharitis left upper eyelid: Secondary | ICD-10-CM | POA: Diagnosis not present

## 2015-12-03 DIAGNOSIS — Z961 Presence of intraocular lens: Secondary | ICD-10-CM | POA: Diagnosis not present

## 2015-12-03 DIAGNOSIS — H5212 Myopia, left eye: Secondary | ICD-10-CM | POA: Diagnosis not present

## 2015-12-05 DIAGNOSIS — N312 Flaccid neuropathic bladder, not elsewhere classified: Secondary | ICD-10-CM | POA: Diagnosis not present

## 2015-12-05 DIAGNOSIS — Z0000 Encounter for general adult medical examination without abnormal findings: Secondary | ICD-10-CM | POA: Diagnosis not present

## 2015-12-08 NOTE — Telephone Encounter (Signed)
I have electronically submitted pt's info for Prolia insurance verification and will notify you once I have a response. Thank you. °

## 2015-12-09 DIAGNOSIS — G6289 Other specified polyneuropathies: Secondary | ICD-10-CM | POA: Diagnosis not present

## 2015-12-09 DIAGNOSIS — G629 Polyneuropathy, unspecified: Secondary | ICD-10-CM | POA: Diagnosis not present

## 2015-12-11 DIAGNOSIS — G610 Guillain-Barre syndrome: Secondary | ICD-10-CM | POA: Diagnosis not present

## 2015-12-11 DIAGNOSIS — Z8669 Personal history of other diseases of the nervous system and sense organs: Secondary | ICD-10-CM | POA: Diagnosis not present

## 2015-12-11 DIAGNOSIS — D472 Monoclonal gammopathy: Secondary | ICD-10-CM | POA: Diagnosis not present

## 2015-12-13 ENCOUNTER — Encounter: Payer: Self-pay | Admitting: Internal Medicine

## 2015-12-23 ENCOUNTER — Telehealth: Payer: Self-pay | Admitting: *Deleted

## 2015-12-23 NOTE — Telephone Encounter (Signed)
Rose, can you initiate a PA for a Prolia inj? Pt is due to have it on 01/09/16. Thank you so much!!

## 2015-12-24 NOTE — Telephone Encounter (Signed)
I have rec'd [name]' insurance verification for [place] and [facility] is requiring a prior [place].  I have faxed the form to your attn.  Section G needs to be completed and section H needs to be signed by Dr. Cruzita Lederer.  Once complete, please return to me via fax [phone].  Thank you!

## 2015-12-29 NOTE — Telephone Encounter (Signed)
Spoke w/Abigale @ [employer] and she says they are working on this request, please allow 7-10 business days, pending [id number].

## 2016-01-05 ENCOUNTER — Encounter: Payer: Self-pay | Admitting: Internal Medicine

## 2016-01-05 NOTE — Telephone Encounter (Signed)
Brandon Ray w/[employer] [facility] left a mssg on my v/m for Prolia approval.  The auth # is [id number], effective 12/25/2015-12/24/2016.  Brandon Ray' estimated responsibility will be $240. Please make pt aware this is an estimate and we will not know an exact amt until insurance(s) has/have paid.  I have sent a copy of the summary of benefits to be scanned into pt's chart.    If pt cannot afford $195 for his injection, please advise him to contact Prolia at [phone] and select option #1 to see if he qualifies for one of their assistance programs.  If he qualifies they will instruct him how to proceed.    Once pt recs injection, please let me know actual injection date so I can update the Prolia portal.  If you have any questions, please let me know.  Thank you!

## 2016-01-07 DIAGNOSIS — M94261 Chondromalacia, right knee: Secondary | ICD-10-CM | POA: Diagnosis not present

## 2016-01-07 DIAGNOSIS — Y999 Unspecified external cause status: Secondary | ICD-10-CM | POA: Diagnosis not present

## 2016-01-07 DIAGNOSIS — M23351 Other meniscus derangements, posterior horn of lateral meniscus, right knee: Secondary | ICD-10-CM | POA: Diagnosis not present

## 2016-01-07 DIAGNOSIS — S83281A Other tear of lateral meniscus, current injury, right knee, initial encounter: Secondary | ICD-10-CM | POA: Diagnosis not present

## 2016-01-07 DIAGNOSIS — Y939 Activity, unspecified: Secondary | ICD-10-CM | POA: Diagnosis not present

## 2016-01-07 DIAGNOSIS — X58XXXA Exposure to other specified factors, initial encounter: Secondary | ICD-10-CM | POA: Diagnosis not present

## 2016-01-07 DIAGNOSIS — Y929 Unspecified place or not applicable: Secondary | ICD-10-CM | POA: Diagnosis not present

## 2016-01-26 ENCOUNTER — Encounter: Payer: Self-pay | Admitting: Internal Medicine

## 2016-01-28 ENCOUNTER — Ambulatory Visit (INDEPENDENT_AMBULATORY_CARE_PROVIDER_SITE_OTHER): Payer: Medicare HMO | Admitting: *Deleted

## 2016-01-28 ENCOUNTER — Other Ambulatory Visit (INDEPENDENT_AMBULATORY_CARE_PROVIDER_SITE_OTHER): Payer: Medicare HMO | Admitting: *Deleted

## 2016-01-28 DIAGNOSIS — M810 Age-related osteoporosis without current pathological fracture: Secondary | ICD-10-CM

## 2016-01-28 MED ORDER — DENOSUMAB 60 MG/ML ~~LOC~~ SOLN
60.0000 mg | Freq: Once | SUBCUTANEOUS | Status: AC
  Administered 2016-01-28: 60 mg via SUBCUTANEOUS

## 2016-02-03 DIAGNOSIS — G6289 Other specified polyneuropathies: Secondary | ICD-10-CM | POA: Diagnosis not present

## 2016-02-03 DIAGNOSIS — G629 Polyneuropathy, unspecified: Secondary | ICD-10-CM | POA: Diagnosis not present

## 2016-03-15 DIAGNOSIS — R69 Illness, unspecified: Secondary | ICD-10-CM | POA: Diagnosis not present

## 2016-03-31 DIAGNOSIS — H35363 Drusen (degenerative) of macula, bilateral: Secondary | ICD-10-CM | POA: Diagnosis not present

## 2016-03-31 DIAGNOSIS — H35371 Puckering of macula, right eye: Secondary | ICD-10-CM | POA: Diagnosis not present

## 2016-03-31 DIAGNOSIS — H43813 Vitreous degeneration, bilateral: Secondary | ICD-10-CM | POA: Diagnosis not present

## 2016-03-31 DIAGNOSIS — H353133 Nonexudative age-related macular degeneration, bilateral, advanced atrophic without subfoveal involvement: Secondary | ICD-10-CM | POA: Diagnosis not present

## 2016-04-01 DIAGNOSIS — R262 Difficulty in walking, not elsewhere classified: Secondary | ICD-10-CM | POA: Diagnosis not present

## 2016-04-01 DIAGNOSIS — G6289 Other specified polyneuropathies: Secondary | ICD-10-CM | POA: Diagnosis not present

## 2016-04-08 DIAGNOSIS — D472 Monoclonal gammopathy: Secondary | ICD-10-CM | POA: Diagnosis not present

## 2016-04-08 DIAGNOSIS — G610 Guillain-Barre syndrome: Secondary | ICD-10-CM | POA: Diagnosis not present

## 2016-04-08 DIAGNOSIS — Z8669 Personal history of other diseases of the nervous system and sense organs: Secondary | ICD-10-CM | POA: Diagnosis not present

## 2016-05-12 DIAGNOSIS — S83411S Sprain of medial collateral ligament of right knee, sequela: Secondary | ICD-10-CM | POA: Diagnosis not present

## 2016-05-12 DIAGNOSIS — Z4789 Encounter for other orthopedic aftercare: Secondary | ICD-10-CM | POA: Diagnosis not present

## 2016-05-12 DIAGNOSIS — S83241D Other tear of medial meniscus, current injury, right knee, subsequent encounter: Secondary | ICD-10-CM | POA: Diagnosis not present

## 2016-05-20 ENCOUNTER — Other Ambulatory Visit: Payer: Self-pay | Admitting: Internal Medicine

## 2016-06-14 ENCOUNTER — Telehealth: Payer: Self-pay | Admitting: Cardiology

## 2016-06-14 NOTE — Telephone Encounter (Signed)
Returned call to patient.He stated he was calling to schedule follow up appointment with Dr.Jordan.Appointment scheduled with Dr.Jordan 09/06/16 at 2:30 pm.

## 2016-06-14 NOTE — Telephone Encounter (Signed)
New Message  Pt voiced he would like to speak to the nurse regarding MD-Jordan's schedule and availability.  I place pt on waiting list for first available and he'll be contacted when MD-Jordan's schedule opens.  Pt voiced for nurse to contact pt regarding MD-Jordan Schedule.  Pt also declined APP.  Please f/u with pt

## 2016-06-14 NOTE — Telephone Encounter (Signed)
error 

## 2016-07-06 DIAGNOSIS — Z4789 Encounter for other orthopedic aftercare: Secondary | ICD-10-CM | POA: Diagnosis not present

## 2016-07-06 DIAGNOSIS — S83241D Other tear of medial meniscus, current injury, right knee, subsequent encounter: Secondary | ICD-10-CM | POA: Diagnosis not present

## 2016-07-12 ENCOUNTER — Encounter: Payer: Self-pay | Admitting: Internal Medicine

## 2016-07-13 ENCOUNTER — Encounter: Payer: Self-pay | Admitting: Physical Therapy

## 2016-07-13 ENCOUNTER — Ambulatory Visit: Payer: Medicare HMO | Attending: Orthopedic Surgery | Admitting: Physical Therapy

## 2016-07-13 DIAGNOSIS — M6281 Muscle weakness (generalized): Secondary | ICD-10-CM | POA: Insufficient documentation

## 2016-07-13 DIAGNOSIS — M25561 Pain in right knee: Secondary | ICD-10-CM | POA: Diagnosis not present

## 2016-07-13 DIAGNOSIS — R262 Difficulty in walking, not elsewhere classified: Secondary | ICD-10-CM | POA: Diagnosis not present

## 2016-07-13 NOTE — Therapy (Signed)
[address] [place], [place], [address] Phone: [phone]   Fax:  [phone]  Physical Therapy Evaluation  Patient Details  Name: Brandon Ray MRN: [id number] Date of Birth: 08-17-1935 Referring Provider: Veverly Fells  Encounter Date: 07/13/2016      PT End of Session - 07/13/16 1440    Visit Number 1   Date for PT Re-Evaluation 09/12/16   PT Start Time 0093   PT Stop Time 1445   PT Time Calculation (min) 50 min   Activity Tolerance Patient tolerated treatment well   Behavior During Therapy [facility] for tasks assessed/performed      Past Medical History:  Diagnosis Date  . BPH (benign prostatic hypertrophy) 2014  . Coronary artery disease 01/2003   CABG  . Free monoclonal light chain    urine  . Hyperlipidemia   . Peripheral neuropathy ([place]) 05/2011    follow up with Dr. Tish Frederickson multiple issues  . Pleural effusion 05/2011   from abdominal surgery  . Seizures ([place]) 1950's   last one 1960  . Small bowel obstruction 05/2011   due to adhesion  . Weakness 2013-May began   lower extemities    Past Surgical History:  Procedure Laterality Date  . ABDOMINAL SURGERY  05/2011   bowel obstruction  . [place]   left  . CARDIAC CATHETERIZATION  01/27/2003   NORMAL LEFT VENTRICULAR SIZE WITH MILD FOCAL LATERAL WALL HYPOKINESIA. EF 60%  . CORONARY ARTERY BYPASS GRAFT  2004   LIMA GRAFT TO THE LAD, SAPHENOUS VEIN GRAFT TO THE DIAGONAL, SAPHENOUS VEIN GRAFT TO THE FIRST OM, SAPHENOUS VEIN GRAFT TO THE PDA  . HAND SURGERY  2000   left  . TRANSURETHRAL RESECTION OF PROSTATE  1992  . TRANSURETHRAL RESECTION OF PROSTATE N/A 12/22/2012   Procedure: TRANSURETHRAL RESECTION OF THE PROSTATE WITH GYRUS (TURP);  Surgeon: Fredricka Bonine, MD;  Location: [facility];  Service: Urology;  Laterality: N/A;    There were no vitals filed for this visit.       Subjective Assessment  - 07/13/16 1356    Subjective Patient reports that he had a right knee scope in April, reports that he did very well after the surgery but about 6 weeks he started walking and going down hill and started having some right knee pain.  He has bilateral AFO's due to severe neuropathy of the Lower legs.     Limitations Walking   Patient Stated Goals less knee pain, walk again without difficulty   Currently in Pain? Yes   Pain Score 0-No pain   Pain Location Knee   Pain Orientation Right;Medial;Lateral   Pain Descriptors / Indicators Tightness   Pain Type Acute pain   Pain Onset More than a month ago   Pain Frequency Intermittent   Aggravating Factors  first thing in the AM, going down hills pain a 4/10   Pain Relieving Factors at rest   Effect of Pain on Daily Activities some difficulty with walking on slopws            [facility] PT Assessment - 07/13/16 0001      Assessment   Medical Diagnosis right knee pain   Referring Provider Veverly Fells   Onset Date/Surgical Date 12/29/15   Prior Therapy for foot drop years ago     Precautions   Precautions None     Balance Screen   Has the patient fallen in  the past 6 months Yes   How many times? 1   Has the patient had a decrease in activity level because of a fear of falling?  No   Is the patient reluctant to leave their home because of a fear of falling?  No     Home Environment   Additional Comments travels, likes to walk,      Prior Function   Level of Independence Independent   Vocation Retired   Leisure some walking     Mining engineer Comments very forward head, rounded shoulders, decreased core strength, souched posture     ROM / Strength   AROM / PROM / Strength AROM;Strength     AROM   AROM Assessment Site Knee   Right/Left Knee Right   Right Knee Extension 15   Right Knee Flexion 119     Strength   Overall Strength Comments hips 4-/5 for flexion, 4/5 for adduction and abduction, , right knee flexion  4-/5 with some tenderness posterior knee, right knee extension 4+/5, no DF bilaterally, PF 3+/5     Flexibility   Soft Tissue Assessment /Muscle Length yes   Hamstrings right SLR 45 degrees   ITB very tight ITB   Piriformis tight piriformis     Palpation   Palpation comment mild tenderness right medial knee     Transfers   Comments some unsafe transfers where he does not control the descent if the cahir is mobile he has difficulty getting up from sitting     Ambulation/Gait   Gait Comments has bilateral toe off AFO's, knees collapse into valgus and the ankles pronate in, knees remain in flexion, uses a SPC, seems unstable at times                           PT Education - 07/13/16 1440    Education provided Yes   Education Details LE flexibility   Person(s) Educated Patient   Methods Explanation;Demonstration;Handout   Comprehension Verbalized understanding;Verbal cues required;Need further instruction          PT Short Term Goals - 07/13/16 1445      PT SHORT TERM GOAL #1   Title independent with initial HEP   Time 2   Period Weeks   Status New           PT Long Term Goals - 07/13/16 1445      PT LONG TERM GOAL #1   Title report pain in the knee decreased 25%   Time 8   Period Weeks   Status New     PT LONG TERM GOAL #2   Title increase hip strength to 4/5 for flexion   Time 8   Period Weeks   Status New     PT LONG TERM GOAL #3   Title increase right knee extension to 5 degrees full extension   Time 8   Period Weeks   Status New     PT LONG TERM GOAL #4   Title increase right SLR to 65 degrees   Time 8   Period Weeks   Status New               Plan - 07/13/16 1441    Clinical Impression Statement Patient with right knee scope in April, reports that he did well after surgery until he started walking and going down hills really started to cause him right knee pain.  He  has a history of below knee neuropathy with the knee  for AFO's bilaterally due to drop foot.  The knees go into severe valgus and the feet collapse into pronation with weightbearing, his biggest issue is core weakness and very poor LE flexibility   Rehab Potential Good   PT Frequency 2x / week   PT Duration 8 weeks   PT Treatment/Interventions ADLs/Self Care Home Management;Gait training;Stair training;Functional mobility training;Therapeutic activities;Therapeutic exercise;Neuromuscular re-education;Patient/family education;Manual techniques   PT Next Visit Plan Focus on LE flexibility and core stability, could do balance assessment in the future   Consulted and Agree with Plan of Care Patient      Patient will benefit from skilled therapeutic intervention in order to improve the following deficits and impairments:  Abnormal gait, Decreased activity tolerance, Decreased balance, Decreased mobility, Decreased endurance, Decreased range of motion, Decreased strength, Difficulty walking, Impaired flexibility, Pain  Visit Diagnosis: Acute pain of right knee - Plan: PT plan of care cert/re-cert  Difficulty in walking, not elsewhere classified - Plan: PT plan of care cert/re-cert      G-Codes - 78/93/81 1450    Functional Assessment Tool Used foto 68% limitation   Functional Limitation Mobility: Walking and moving around   Mobility: Walking and Moving Around Current Status [phone]) At least 60 percent but less than 80 percent impaired, limited or restricted   Mobility: Walking and Moving Around Goal Status [phone]) At least 40 percent but less than 60 percent impaired, limited or restricted       Problem List Patient Active Problem List   Diagnosis Date Noted  . Osteoporosis 03/13/2014  . Encounter for long-term (current) use of steroids 03/16/2013  . Primary male hypogonadism 10/19/2012  . Endocrinopathy 09/20/2012  . Gynecomastia, male 09/20/2012  . Fracture 09/20/2012  . Free monoclonal light chain   . Monoclonal (M) protein disease,  multiple 'M' protein   . CIDP (chronic inflammatory demyelinating polyneuropathy) ([place]) 06/21/2012  . Constipation 06/20/2012  . Chronic inflammatory demyelinating polyneuropathy ([place]) 06/12/2012  . Acute urinary retention 06/12/2012  . Neurogenic bladder 06/12/2012  . Right sided weakness 06/07/2012  . Seizure disorder ([place]) 06/07/2012  . Pleural effusion on right 07/22/2011  . Tachycardia 07/22/2011  . Small bowel obstruction, s/p lap LOA, c/b post op abscess 07/02/2011  . Coronary artery disease   . Hyperlipidemia     Sumner Boast., PT 07/13/2016, 2:53 PM  [address] [place], [place], [address] Phone: [phone]   Fax:  [phone]  Name: ARJEN DERINGER MRN: [id number] Date of Birth: 02-24-1935

## 2016-07-20 ENCOUNTER — Encounter: Payer: Self-pay | Admitting: Physical Therapy

## 2016-07-20 ENCOUNTER — Ambulatory Visit: Payer: Medicare HMO | Admitting: Physical Therapy

## 2016-07-20 DIAGNOSIS — M25561 Pain in right knee: Secondary | ICD-10-CM | POA: Diagnosis not present

## 2016-07-20 DIAGNOSIS — R262 Difficulty in walking, not elsewhere classified: Secondary | ICD-10-CM

## 2016-07-20 DIAGNOSIS — M6281 Muscle weakness (generalized): Secondary | ICD-10-CM

## 2016-07-20 NOTE — Therapy (Signed)
[facility] [address] [place], [place], [address] Phone: [phone]   Fax:  [phone]  Physical Therapy Treatment  Patient Details  Name: Brandon Ray MRN: [id number] Date of Birth: 05/12/1935 Referring Provider: Ranell Patrick  Encounter Date: 07/20/2016      PT End of Session - 07/20/16 0849    Visit Number 2   Date for PT Re-Evaluation 09/12/16   PT Start Time 0800   PT Stop Time 0850   PT Time Calculation (min) 50 min   Equipment Utilized During Treatment --  chair, single point cane   Activity Tolerance Patient tolerated treatment well;No increased pain;Patient limited by fatigue   Behavior During Therapy [facility] for tasks assessed/performed      Past Medical History:  Diagnosis Date  . BPH (benign prostatic hypertrophy) 2014  . Coronary artery disease 01/2003   CABG  . Free monoclonal light chain    urine  . Hyperlipidemia   . Peripheral neuropathy (HCC) 05/2011    follow up with Dr. Hart Carwin multiple issues  . Pleural effusion 05/2011   from abdominal surgery  . Seizures (HCC) 1950's   last one 1960  . Small bowel obstruction 05/2011   due to adhesion  . Weakness 2013-May began   lower extemities    Past Surgical History:  Procedure Laterality Date  . ABDOMINAL SURGERY  05/2011   bowel obstruction  . ANKLE FRACTURE SURGERY  1997   left  . CARDIAC CATHETERIZATION  01/27/2003   NORMAL LEFT VENTRICULAR SIZE WITH MILD FOCAL LATERAL WALL HYPOKINESIA. EF 60%  . CORONARY ARTERY BYPASS GRAFT  2004   LIMA GRAFT TO THE LAD, SAPHENOUS VEIN GRAFT TO THE DIAGONAL, SAPHENOUS VEIN GRAFT TO THE FIRST OM, SAPHENOUS VEIN GRAFT TO THE PDA  . HAND SURGERY  2000   left  . TRANSURETHRAL RESECTION OF PROSTATE  1992  . TRANSURETHRAL RESECTION OF PROSTATE N/A 12/22/2012   Procedure: TRANSURETHRAL RESECTION OF THE PROSTATE WITH GYRUS (TURP);  Surgeon: Antony Haste, MD;  Location: [facility];   Service: Urology;  Laterality: N/A;    There were no vitals filed for this visit.      Subjective Assessment - 07/20/16 0803    Subjective Patient reports slight pain in the medial aspect of the right knee. He rates this pain 2/10 and states that [facility] stretch causes the pain to increase.   Limitations Walking   Patient Stated Goals less knee pain, walk again without difficulty   Currently in Pain? Yes   Pain Score 2    Pain Location Knee   Pain Orientation Right;Medial;Anterior   Pain Descriptors / Indicators Dull   Pain Type Acute pain   Pain Onset More than a month ago                         [facility] Adult PT Treatment/Exercise - 07/20/16 0001      Exercises   Exercises Lumbar;Knee/Hip;Shoulder;Elbow     Elbow Exercises   Elbow Flexion Strengthening;AROM;Both;20 reps  2 sets   Theraband Level (Elbow Flexion) Other (comment)  black band, patient controls resistance by stepping on band     Lumbar Exercises: Stretches   Single Knee to Chest Stretch 1 rep;30 seconds  R/L    Piriformis Stretch 1 rep;20 seconds;30 seconds   Piriformis Stretch Limitations Rt leg unabale to complete due to medial knee pain     Lumbar Exercises: Aerobic  Stationary Bike 10 minutes, lvl 4     Lumbar Exercises: Supine   Clam 10 reps  blue , 3 sets   Other Supine Lumbar Exercises supine ball squeeze, SLR,     Knee/Hip Exercises: Standing   Hip Flexion Stengthening;AROM;Both;2 sets;20 reps  standing marches, holding onto chair for balance     Knee/Hip Exercises: Supine   Straight Leg Raises Strengthening;Both;1 set;10 reps     Shoulder Exercises: Seated   Retraction AROM;Strengthening;Both;10 reps;Theraband   Theraband Level (Shoulder Retraction) Other (comment)  black band   Retraction Limitations focus on spine and shoulder posture     Modalities   Modalities Moist Heat     Moist Heat Therapy   Number Minutes Moist Heat 10 Minutes   Moist Heat Location Knee   bilateral, supine position     Manual Therapy   Manual Therapy Passive ROM   Manual therapy comments HS, Quad, gastroc stretches  hold 20-30 secs each                PT Education - 07/20/16 0848    Education provided Yes   Education Details Piriformis stretch for HEP, changed to [facility] strtech due to pain in right knee   Person(s) Educated Patient   Methods Explanation;Demonstration;Tactile cues   Comprehension Returned demonstration          PT Short Term Goals - 07/13/16 1445      PT SHORT TERM GOAL #1   Title independent with initial HEP   Time 2   Period Weeks   Status New           PT Long Term Goals - 07/13/16 1445      PT LONG TERM GOAL #1   Title report pain in the knee decreased 25%   Time 8   Period Weeks   Status New     PT LONG TERM GOAL #2   Title increase hip strength to 4/5 for flexion   Time 8   Period Weeks   Status New     PT LONG TERM GOAL #3   Title increase right knee extension to 5 degrees full extension   Time 8   Period Weeks   Status New     PT LONG TERM GOAL #4   Title increase right SLR to 65 degrees   Time 8   Period Weeks   Status New               Plan - 07/20/16 0850    Clinical Impression Statement Patient tolerated treatment well and was able to complete all exercises without any increase in symptoms. Patient states that supine exercises were "easy" difficulty however the standing marches were medium to hard. Patient was unsteady perfroming standing knee marches and a chair was used to assist with patient balance and safety.    Rehab Potential Good   PT Frequency 2x / week   PT Duration 8 weeks   PT Treatment/Interventions ADLs/Self Care Home Management;Gait training;Stair training;Functional mobility training;Therapeutic activities;Therapeutic exercise;Neuromuscular re-education;Patient/family education;Manual techniques   PT Next Visit Plan Balance and coordination, focus on posture with all  seated/standing exercises, endurance exercises   PT Home Exercise Plan Supine LE stretching   Consulted and Agree with Plan of Care Patient      Patient will benefit from skilled therapeutic intervention in order to improve the following deficits and impairments:  Abnormal gait, Decreased activity tolerance, Decreased balance, Decreased mobility, Decreased endurance, Decreased range of motion, Decreased strength,  Difficulty walking, Impaired flexibility, Pain  Visit Diagnosis: No diagnosis found.     Problem List Patient Active Problem List   Diagnosis Date Noted  . Osteoporosis 03/13/2014  . Encounter for long-term (current) use of steroids 03/16/2013  . Primary male hypogonadism 10/19/2012  . Endocrinopathy 09/20/2012  . Gynecomastia, male 09/20/2012  . Fracture 09/20/2012  . Free monoclonal light chain   . Monoclonal (M) protein disease, multiple 'M' protein   . CIDP (chronic inflammatory demyelinating polyneuropathy) ([place]) 06/21/2012  . Constipation 06/20/2012  . Chronic inflammatory demyelinating polyneuropathy ([place]) 06/12/2012  . Acute urinary retention 06/12/2012  . Neurogenic bladder 06/12/2012  . Right sided weakness 06/07/2012  . Seizure disorder ([place]) 06/07/2012  . Pleural effusion on right 07/22/2011  . Tachycardia 07/22/2011  . Small bowel obstruction, s/p lap LOA, c/b post op abscess 07/02/2011  . Coronary artery disease   . Hyperlipidemia     [name] [place], SPT 07/20/2016, 9:01 AM  [address] [place], [place], [address] Phone: [phone]   Fax:  [phone]  Name: Brandon Ray MRN: [id number] Date of Birth: 1935/03/04

## 2016-07-21 DIAGNOSIS — 419620001 Death: Secondary | SNOMED CT | POA: Diagnosis not present

## 2016-07-21 DEATH — deceased

## 2016-07-23 ENCOUNTER — Encounter: Payer: Self-pay | Admitting: Physical Therapy

## 2016-07-23 ENCOUNTER — Ambulatory Visit: Payer: Medicare HMO | Attending: Orthopedic Surgery | Admitting: Physical Therapy

## 2016-07-23 DIAGNOSIS — M6281 Muscle weakness (generalized): Secondary | ICD-10-CM | POA: Insufficient documentation

## 2016-07-23 DIAGNOSIS — R262 Difficulty in walking, not elsewhere classified: Secondary | ICD-10-CM | POA: Diagnosis not present

## 2016-07-23 DIAGNOSIS — M25561 Pain in right knee: Secondary | ICD-10-CM

## 2016-07-23 NOTE — Therapy (Signed)
[facility] [place], [place], [address] Phone: [phone]   Fax:  [phone]  Physical Therapy Treatment  Patient Details  Name: Brandon Ray MRN: [id number] Date of Birth: [birth year] Referring Provider: Veverly Fells  Encounter Date: 07/23/2016      PT End of Session - 07/23/16 0841    Visit Number 3   Date for PT Re-Evaluation 09/12/16   PT Start Time 0758   PT Stop Time 0845   PT Time Calculation (min) 47 min   Activity Tolerance Patient tolerated treatment well;No increased pain   Behavior During Therapy WFL for tasks assessed/performed      Past Medical History:  Diagnosis Date  . BPH (benign prostatic hypertrophy) 2014  . Coronary artery disease 01/2003   CABG  . Free monoclonal light chain    urine  . Hyperlipidemia   . Peripheral neuropathy ([place]) 05/2011    follow up with Dr. Tish Frederickson multiple issues  . Pleural effusion 05/2011   from abdominal surgery  . Seizures ([place]) 1950's   last one 1960  . Small bowel obstruction 05/2011   due to adhesion  . Weakness 2013-May began   lower extemities    Past Surgical History:  Procedure Laterality Date  . ABDOMINAL SURGERY  05/2011   bowel obstruction  . [place]   left  . CARDIAC CATHETERIZATION  01/27/2003   NORMAL LEFT VENTRICULAR SIZE WITH MILD FOCAL LATERAL WALL HYPOKINESIA. EF 60%  . CORONARY ARTERY BYPASS GRAFT  2004   LIMA GRAFT TO THE LAD, SAPHENOUS VEIN GRAFT TO THE DIAGONAL, SAPHENOUS VEIN GRAFT TO THE FIRST OM, SAPHENOUS VEIN GRAFT TO THE PDA  . HAND SURGERY  2000   left  . TRANSURETHRAL RESECTION OF PROSTATE  1992  . TRANSURETHRAL RESECTION OF PROSTATE N/A 12/22/2012   Procedure: TRANSURETHRAL RESECTION OF THE PROSTATE WITH GYRUS (TURP);  Surgeon: Fredricka Bonine, MD;  Location: [facility];  Service: Urology;  Laterality: N/A;    There were no vitals filed for this visit.       Subjective Assessment - 07/23/16 0801    Subjective Patient reports feeling good this morning and does not report any pain when asked. He states that he was a litle sore between the shoulder blades  following the last treatment but nothing caused increased pain. Patient states he has been doing all of his stretches twice a day.   Limitations Walking   Patient Stated Goals less knee pain, walk again without difficulty   Pain Score 0-No pain                         [facility] Adult PT Treatment/Exercise - 07/23/16 0001      Lumbar Exercises: Stretches   Single Knee to Chest Stretch 1 rep;30 seconds     Lumbar Exercises: Aerobic   Stationary Bike NuStep 6 minutes, lvl 5     Knee/Hip Exercises: Stretches   Gastroc Stretch 2 reps;Both;30 seconds  one leg at a time     Knee/Hip Exercises: Standing   Hip Flexion Stengthening;Both;1 set;20 reps  Standing while holding onto back of chair     Knee/Hip Exercises: Seated   Hamstring Curl Strengthening;Both;2 sets;20 reps    Hamstring Weights 35 lbs.     Shoulder Exercises: Seated   Retraction AAROM;Strengthening;Both;20 reps   Theraband Level (Shoulder Retraction) Level 4 (Blue)   Row 10 reps;Strengthening  2 sets   Row Weight (lbs) 20   Other Seated Exercises Lat pulldown  1 set, 20 lbs     Shoulder Exercises: Standing   Extension Strengthening;AROM;Both;20 reps;Theraband   Theraband Level (Shoulder Extension) Level 2 (Red)     Moist Heat Therapy   Number Minutes Moist Heat 10 Minutes   Moist Heat Location Knee     Manual Therapy   Manual Therapy Passive ROM   Manual therapy comments HS, Quad, gastroc stretch, SKC                PT Education - 07/23/16 0840    Education provided Yes   Education Details Gastroc stretch   Person(s) Educated Patient   Methods Demonstration;Explanation   Comprehension Verbalized understanding;Returned demonstration          PT Short Term Goals - 07/13/16 1445       PT SHORT TERM GOAL #1   Title independent with initial HEP   Time 2   Period Weeks   Status New           PT Long Term Goals - 07/13/16 1445      PT LONG TERM GOAL #1   Title report pain in the knee decreased 25%   Time 8   Period Weeks   Status New     PT LONG TERM GOAL #2   Title increase hip strength to 4/5 for flexion   Time 8   Period Weeks   Status New     PT LONG TERM GOAL #3   Title increase right knee extension to 5 degrees full extension   Time 8   Period Weeks   Status New     PT LONG TERM GOAL #4   Title increase right SLR to 65 degrees   Time 8   Period Weeks   Status New               Plan - 07/23/16 0841    Clinical Impression Statement Patient tolerated treatment well and was able to complete all exercises. Pt did not experience any increase in pain this morning and did not report any "dizziness". Patient toelrated heat in sitting poisition much better than supine position. COntinue to progress per pt tolerance.   Rehab Potential Good   PT Frequency 2x / week   PT Duration 8 weeks   PT Treatment/Interventions ADLs/Self Care Home Management;Gait training;Stair training;Functional mobility training;Therapeutic activities;Therapeutic exercise;Neuromuscular re-education;Patient/family education;Manual techniques   PT Next Visit Plan Balance and coordination, focus on posture with all seated/standing exercises, endurance exercises   PT Home Exercise Plan Supine LE stretching   Consulted and Agree with Plan of Care Patient      Patient will benefit from skilled therapeutic intervention in order to improve the following deficits and impairments:  Abnormal gait, Decreased activity tolerance, Decreased balance, Decreased mobility, Decreased endurance, Decreased range of motion, Decreased strength, Difficulty walking, Impaired flexibility, Pain  Visit Diagnosis: Acute pain of right knee  Difficulty in walking, not elsewhere classified  Muscle  weakness (generalized)     Problem List Patient Active Problem List   Diagnosis Date Noted  . Osteoporosis 03/13/2014  . Encounter for long-term (current) use of steroids 03/16/2013  . Primary male hypogonadism 10/19/2012  . Endocrinopathy 09/20/2012  . Gynecomastia, male 09/20/2012  . Fracture 09/20/2012  . Free monoclonal light chain   . Monoclonal (M) protein disease, multiple 'M' protein   . CIDP (chronic inflammatory demyelinating polyneuropathy) ([place]) 06/21/2012  .  Constipation 06/20/2012  . Chronic inflammatory demyelinating polyneuropathy ([place]) 06/12/2012  . Acute urinary retention 06/12/2012  . Neurogenic bladder 06/12/2012  . Right sided weakness 06/07/2012  . Seizure disorder ([place]) 06/07/2012  . Pleural effusion on right 07/22/2011  . Tachycardia 07/22/2011  . Small bowel obstruction, s/p lap LOA, c/b post op abscess 07/02/2011  . Coronary artery disease   . Hyperlipidemia     [name] [place], SPT 07/23/2016, 8:45 AM  [address] [place], [place], [address] Phone: [phone]   Fax:  [phone]  Name: BETHANY CUMMING MRN: [id number] Date of Birth: [birth year]

## 2016-07-24 ENCOUNTER — Other Ambulatory Visit: Payer: Self-pay | Admitting: Cardiology

## 2016-07-27 ENCOUNTER — Ambulatory Visit: Payer: Medicare HMO | Admitting: Physical Therapy

## 2016-07-27 ENCOUNTER — Encounter: Payer: Self-pay | Admitting: Physical Therapy

## 2016-07-27 DIAGNOSIS — R262 Difficulty in walking, not elsewhere classified: Secondary | ICD-10-CM

## 2016-07-27 DIAGNOSIS — M25561 Pain in right knee: Secondary | ICD-10-CM

## 2016-07-27 DIAGNOSIS — M6281 Muscle weakness (generalized): Secondary | ICD-10-CM

## 2016-07-27 NOTE — Therapy (Signed)
[place] [place], [place], [address] Phone: [phone]   Fax:  [phone]  Physical Therapy Treatment  Patient Details  Name: Brandon Ray MRN: [id number] Date of Birth: 1935/02/12 Referring Provider: Veverly Fells  Encounter Date: 07/27/2016      PT End of Session - 07/27/16 0904    Visit Number 4   Date for PT Re-Evaluation 09/12/16   PT Start Time 0835   PT Stop Time 0920   PT Time Calculation (min) 45 min   Activity Tolerance Patient tolerated treatment well;No increased pain   Behavior During Therapy WFL for tasks assessed/performed      Past Medical History:  Diagnosis Date  . BPH (benign prostatic hypertrophy) 2014  . Coronary artery disease 01/2003   CABG  . Free monoclonal light chain    urine  . Hyperlipidemia   . Peripheral neuropathy ([place]) 05/2011    follow up with Dr. Tish Frederickson multiple issues  . Pleural effusion 05/2011   from abdominal surgery  . Seizures ([place]) 1950's   last one 1960  . Small bowel obstruction 05/2011   due to adhesion  . Weakness 2013-May began   lower extemities    Past Surgical History:  Procedure Laterality Date  . ABDOMINAL SURGERY  05/2011   bowel obstruction  . [place]   left  . CARDIAC CATHETERIZATION  01/27/2003   NORMAL LEFT VENTRICULAR SIZE WITH MILD FOCAL LATERAL WALL HYPOKINESIA. EF 60%  . CORONARY ARTERY BYPASS GRAFT  2004   LIMA GRAFT TO THE LAD, SAPHENOUS VEIN GRAFT TO THE DIAGONAL, SAPHENOUS VEIN GRAFT TO THE FIRST OM, SAPHENOUS VEIN GRAFT TO THE PDA  . HAND SURGERY  2000   left  . TRANSURETHRAL RESECTION OF PROSTATE  1992  . TRANSURETHRAL RESECTION OF PROSTATE N/A 12/22/2012   Procedure: TRANSURETHRAL RESECTION OF THE PROSTATE WITH GYRUS (TURP);  Surgeon: Fredricka Bonine, MD;  Location: [facility];  Service: Urology;  Laterality: N/A;    There were no vitals filed for this visit.       Subjective Assessment - 07/27/16 0837    Subjective Patient reports feeling "okay" this morning. He states that he is experiencing some discomfort with one of his home stretches but other than that, he is feeling fine. He reports no pain.   Limitations Walking   Patient Stated Goals less knee pain, walk again without difficulty   Currently in Pain? No/denies   Pain Score 0-No pain   Pain Onset More than a month ago                         [facility] Adult PT Treatment/Exercise - 07/27/16 0001      Lumbar Exercises: Stretches   Passive Hamstring Stretch 1 rep;30 seconds  bilateral   Single Knee to Chest Stretch 1 rep;30 seconds  bilateral   Piriformis Stretch 1 rep;30 seconds  bilateral     Lumbar Exercises: Aerobic   Stationary Bike Nustep lvl 6, 8 min     Knee/Hip Exercises: Stretches   Gastroc Stretch 2 reps;30 seconds;Right;Left  2 inch bar     Knee/Hip Exercises: Standing   Hip Abduction Stengthening;Both;Left;Right;2 sets;10 reps   Hip Extension Stengthening;AROM;10 reps;Knee straight;2 sets;Left;Right  body weight only     Knee/Hip Exercises: Seated   Long Arc Quad Strengthening;Both;2 sets;10 reps  machine   Long Arc Quad Weight 35 lbs.  Hamstring Curl Strengthening;Both;2 sets;10 reps  machine    Hamstring Weights 45 lbs.   Abduction/Adduction  Strengthening;Both;2 sets;10 reps  green tband around distal femurs     Shoulder Exercises: Standing   Row Theraband;10 reps;Strengthening  2 sets   Theraband Level (Shoulder Row) Level 3 (Green)     Manual Therapy   Manual Therapy Passive ROM   Manual therapy comments HS, Quad, piriformis, gastroc                PT Education - 07/27/16 0910    Education provided Yes   Education Details home gastroc stretch   Person(s) Educated Patient   Methods Explanation;Demonstration   Comprehension Verbalized understanding;Returned demonstration          PT Short Term Goals - 07/13/16 1445       PT SHORT TERM GOAL #1   Title independent with initial HEP   Time 2   Period Weeks   Status New           PT Long Term Goals - 07/13/16 1445      PT LONG TERM GOAL #1   Title report pain in the knee decreased 25%   Time 8   Period Weeks   Status New     PT LONG TERM GOAL #2   Title increase hip strength to 4/5 for flexion   Time 8   Period Weeks   Status New     PT LONG TERM GOAL #3   Title increase right knee extension to 5 degrees full extension   Time 8   Period Weeks   Status New     PT LONG TERM GOAL #4   Title increase right SLR to 65 degrees   Time 8   Period Weeks   Status New               Plan - 07/27/16 0910    Clinical Impression Statement Patient tolerated treatment well and was able to complete all exercises. Patient has ben compliant with all home exercises. He continues to have vlagus stress on bilateral knees and hip abduction and external rotation exerciss were added today in order to take the pressure off the medial aspect of the knees. Patient is unable to do active toe raises. He reports good benefit from gastroc stretches and home gastroc stretch was given today.   Rehab Potential Good   PT Frequency 2x / week   PT Duration 8 weeks   PT Treatment/Interventions ADLs/Self Care Home Management;Gait training;Stair training;Functional mobility training;Therapeutic activities;Therapeutic exercise;Neuromuscular re-education;Patient/family education;Manual techniques   PT Next Visit Plan Balance and coordination, focus on posture with all seated/standing exercises, endurance exercises   PT Home Exercise Plan Supine LE stretching, standing calf stretch   Consulted and Agree with Plan of Care Patient      Patient will benefit from skilled therapeutic intervention in order to improve the following deficits and impairments:  Abnormal gait, Decreased activity tolerance, Decreased balance, Decreased mobility, Decreased endurance, Decreased range of  motion, Decreased strength, Difficulty walking, Impaired flexibility, Pain  Visit Diagnosis: Acute pain of right knee  Difficulty in walking, not elsewhere classified  Muscle weakness (generalized)     Problem List Patient Active Problem List   Diagnosis Date Noted  . Osteoporosis 03/13/2014  . Encounter for long-term (current) use of steroids 03/16/2013  . Primary male hypogonadism 10/19/2012  . Endocrinopathy 09/20/2012  . Gynecomastia, male 09/20/2012  . Fracture 09/20/2012  . Free monoclonal light chain   .  Monoclonal (M) protein disease, multiple 'M' protein   . CIDP (chronic inflammatory demyelinating polyneuropathy) ([place]) 06/21/2012  . Constipation 06/20/2012  . Chronic inflammatory demyelinating polyneuropathy ([place]) 06/12/2012  . Acute urinary retention 06/12/2012  . Neurogenic bladder 06/12/2012  . Right sided weakness 06/07/2012  . Seizure disorder ([place]) 06/07/2012  . Pleural effusion on right 07/22/2011  . Tachycardia 07/22/2011  . Small bowel obstruction, s/p lap LOA, c/b post op abscess 07/02/2011  . Coronary artery disease   . Hyperlipidemia     Toy Baker, SPT 07/27/2016, 9:19 AM  [address] [place], [place], [address] Phone: [phone]   Fax:  [phone]  Name: GRAYDON FOFANA MRN: [id number] Date of Birth: 13-Feb-1935

## 2016-07-30 ENCOUNTER — Encounter: Payer: Self-pay | Admitting: Physical Therapy

## 2016-07-30 ENCOUNTER — Ambulatory Visit: Payer: Medicare HMO | Admitting: Physical Therapy

## 2016-07-30 DIAGNOSIS — M6281 Muscle weakness (generalized): Secondary | ICD-10-CM

## 2016-07-30 DIAGNOSIS — R262 Difficulty in walking, not elsewhere classified: Secondary | ICD-10-CM

## 2016-07-30 DIAGNOSIS — M25561 Pain in right knee: Secondary | ICD-10-CM | POA: Diagnosis not present

## 2016-07-30 NOTE — Therapy (Signed)
[address] [place], [place], [address] Phone: [phone]   Fax:  [phone]  Physical Therapy Treatment  Patient Details  Name: Brandon Ray MRN: [id number] Date of Birth: 1934-10-06 Referring Provider: Veverly Fells  Encounter Date: 07/30/2016      PT End of Session - 07/30/16 0832    Visit Number 5   Date for PT Re-Evaluation 09/12/16   PT Start Time 0750   PT Stop Time 0840   PT Time Calculation (min) 50 min   Activity Tolerance Patient tolerated treatment well;No increased pain      Past Medical History:  Diagnosis Date  . BPH (benign prostatic hypertrophy) 2014  . Coronary artery disease 01/2003   CABG  . Free monoclonal light chain    urine  . Hyperlipidemia   . Peripheral neuropathy ([place]) 05/2011    follow up with Dr. Tish Frederickson multiple issues  . Pleural effusion 05/2011   from abdominal surgery  . Seizures ([place]) 1950's   last one 1960  . Small bowel obstruction 05/2011   due to adhesion  . Weakness 2013-May began   lower extemities    Past Surgical History:  Procedure Laterality Date  . ABDOMINAL SURGERY  05/2011   bowel obstruction  . [place]   left  . CARDIAC CATHETERIZATION  01/27/2003   NORMAL LEFT VENTRICULAR SIZE WITH MILD FOCAL LATERAL WALL HYPOKINESIA. EF 60%  . CORONARY ARTERY BYPASS GRAFT  2004   LIMA GRAFT TO THE LAD, SAPHENOUS VEIN GRAFT TO THE DIAGONAL, SAPHENOUS VEIN GRAFT TO THE FIRST OM, SAPHENOUS VEIN GRAFT TO THE PDA  . HAND SURGERY  2000   left  . TRANSURETHRAL RESECTION OF PROSTATE  1992  . TRANSURETHRAL RESECTION OF PROSTATE N/A 12/22/2012   Procedure: TRANSURETHRAL RESECTION OF THE PROSTATE WITH GYRUS (TURP);  Surgeon: Fredricka Bonine, MD;  Location: [facility];  Service: Urology;  Laterality: N/A;    There were no vitals filed for this visit.      Subjective Assessment - 07/30/16 0802    Subjective --   Currently in Pain? --   Pain Score --                         [facility] Adult PT Treatment/Exercise - 07/30/16 0001      Lumbar Exercises: Aerobic   Stationary Bike NuStep 10 minutes     Knee/Hip Exercises: Machines for Strengthening   Cybex Knee Extension 25 lbs, 2x10   Cybex Knee Flexion 45 lbs, 2x10   Cybex Leg Press 20 lbs, 2x20     Knee/Hip Exercises: Seated   Abduction/Adduction  Strengthening;Both;2 sets;10 reps  red tband   Sit to Sand 20 reps     Manual Therapy   Manual Therapy Passive ROM   Manual therapy comments HS, IT band, SKC, gastroc                PT Education - 07/30/16 0831    Education provided Yes   Education Details Substituted current home HS stretcth for differnet version   Person(s) Educated Patient   Methods Explanation;Demonstration;Verbal cues   Comprehension Verbalized understanding;Returned demonstration          PT Short Term Goals - 07/30/16 0837      PT SHORT TERM GOAL #1   Status Achieved           PT Long Term Goals -  07/30/16 0837      PT LONG TERM GOAL #1   Status Achieved     PT LONG TERM GOAL #2   Status On-going     PT LONG TERM GOAL #3   Status On-going               Plan - 07/30/16 0833    Clinical Impression Statement Patient tolerated treatmentwel and was able to complete all exercises without increased pain. Patient was educated on proper mechanics behind sit to stand activity and vebal/tactile cues were given in order to ensur the patient keep his knees from going into a valgus position. Patient states that when he keeps his knees in line with his feet, he noticed that he had decreased pain in the medial aspect of his right knee.   Rehab Potential Good   PT Frequency 2x / week   PT Duration 8 weeks   PT Treatment/Interventions ADLs/Self Care Home Management;Gait training;Stair training;Functional mobility training;Therapeutic activities;Therapeutic exercise;Neuromuscular  re-education;Patient/family education;Manual techniques   PT Next Visit Plan Balance and coordination, focus on posture with all seated/standing exercises, endurance exercises   PT Home Exercise Plan Supine LE stretching, standing calf stretch, seated HS stretch   Consulted and Agree with Plan of Care Patient      Patient will benefit from skilled therapeutic intervention in order to improve the following deficits and impairments:  Abnormal gait, Decreased activity tolerance, Decreased balance, Decreased mobility, Decreased endurance, Decreased range of motion, Decreased strength, Difficulty walking, Impaired flexibility, Pain  Visit Diagnosis: Acute pain of right knee  Difficulty in walking, not elsewhere classified  Muscle weakness (generalized)     Problem List Patient Active Problem List   Diagnosis Date Noted  . Osteoporosis 03/13/2014  . Encounter for long-term (current) use of steroids 03/16/2013  . Primary male hypogonadism 10/19/2012  . Endocrinopathy 09/20/2012  . Gynecomastia, male 09/20/2012  . Fracture 09/20/2012  . Free monoclonal light chain   . Monoclonal (M) protein disease, multiple 'M' protein   . CIDP (chronic inflammatory demyelinating polyneuropathy) ([place]) 06/21/2012  . Constipation 06/20/2012  . Chronic inflammatory demyelinating polyneuropathy ([place]) 06/12/2012  . Acute urinary retention 06/12/2012  . Neurogenic bladder 06/12/2012  . Right sided weakness 06/07/2012  . Seizure disorder ([place]) 06/07/2012  . Pleural effusion on right 07/22/2011  . Tachycardia 07/22/2011  . Small bowel obstruction, s/p lap LOA, c/b post op abscess 07/02/2011  . Coronary artery disease   . Hyperlipidemia     Toy Baker, SPT 07/30/2016, 8:39 AM  [place] [address] [place], [place], [address] Phone: [phone]   Fax:  [phone]  Name: Brandon Ray MRN: [id number] Date of Birth:  06-01-1935

## 2016-08-03 ENCOUNTER — Encounter: Payer: Self-pay | Admitting: Physical Therapy

## 2016-08-03 ENCOUNTER — Ambulatory Visit: Payer: Medicare HMO | Admitting: Physical Therapy

## 2016-08-03 ENCOUNTER — Ambulatory Visit: Payer: Medicare HMO

## 2016-08-03 DIAGNOSIS — R262 Difficulty in walking, not elsewhere classified: Secondary | ICD-10-CM

## 2016-08-03 DIAGNOSIS — M810 Age-related osteoporosis without current pathological fracture: Secondary | ICD-10-CM

## 2016-08-03 DIAGNOSIS — M6281 Muscle weakness (generalized): Secondary | ICD-10-CM | POA: Diagnosis not present

## 2016-08-03 DIAGNOSIS — M25561 Pain in right knee: Secondary | ICD-10-CM

## 2016-08-03 DIAGNOSIS — T148XXA Other injury of unspecified body region, initial encounter: Secondary | ICD-10-CM

## 2016-08-03 MED ORDER — DENOSUMAB 60 MG/ML ~~LOC~~ SOLN
60.0000 mg | Freq: Once | SUBCUTANEOUS | Status: AC
  Administered 2016-08-03: 60 mg via SUBCUTANEOUS

## 2016-08-03 NOTE — Therapy (Signed)
[place] [address] [place], [place], [address] Phone: [phone]   Fax:  [phone]  Physical Therapy Treatment  Patient Details  Name: Brandon Ray MRN: [id number] Date of Birth: Jul 03, 1935 Referring Provider: Veverly Fells  Encounter Date: 08/03/2016      PT End of Session - 08/03/16 0936    Visit Number 6   Date for PT Re-Evaluation 09/12/16   PT Start Time 0845   PT Stop Time 0930   PT Time Calculation (min) 45 min   Equipment Utilized During Treatment Gait belt   Activity Tolerance Other (comment)  Patient limited by loss of balance and unsteady standing posture   Behavior During Therapy [facility] for tasks assessed/performed      Past Medical History:  Diagnosis Date  . BPH (benign prostatic hypertrophy) 2014  . Coronary artery disease 01/2003   CABG  . Free monoclonal light chain    urine  . Hyperlipidemia   . Peripheral neuropathy ([place]) 05/2011    follow up with Dr. Tish Frederickson multiple issues  . Pleural effusion 05/2011   from abdominal surgery  . Seizures ([place]) 1950's   last one 1960  . Small bowel obstruction 05/2011   due to adhesion  . Weakness 2013-May began   lower extemities    Past Surgical History:  Procedure Laterality Date  . ABDOMINAL SURGERY  05/2011   bowel obstruction  . [place]   left  . CARDIAC CATHETERIZATION  01/27/2003   NORMAL LEFT VENTRICULAR SIZE WITH MILD FOCAL LATERAL WALL HYPOKINESIA. EF 60%  . CORONARY ARTERY BYPASS GRAFT  2004   LIMA GRAFT TO THE LAD, SAPHENOUS VEIN GRAFT TO THE DIAGONAL, SAPHENOUS VEIN GRAFT TO THE FIRST OM, SAPHENOUS VEIN GRAFT TO THE PDA  . HAND SURGERY  2000   left  . TRANSURETHRAL RESECTION OF PROSTATE  1992  . TRANSURETHRAL RESECTION OF PROSTATE N/A 12/22/2012   Procedure: TRANSURETHRAL RESECTION OF THE PROSTATE WITH GYRUS (TURP);  Surgeon: Fredricka Bonine, MD;  Location: [facility];  Service: Urology;   Laterality: N/A;    There were no vitals filed for this visit.      Subjective Assessment - 08/03/16 0850    Subjective Patient reports feeling sore after the last session. He does not have any pain this morning.    Limitations Walking   Patient Stated Goals less knee pain, walk again without difficulty   Currently in Pain? No/denies   Pain Onset More than a month ago                         [facility] Adult PT Treatment/Exercise - 08/03/16 0001      High Level Balance   High Level Balance Activities Side stepping  wedding walks. contact guard assist     Neuro Re-ed    Neuro Re-ed Details  static standing; min assist     Exercises   Exercises Ankle     Lumbar Exercises: Aerobic   Stationary Bike NuStep 10 minutes     Knee/Hip Exercises: Machines for Strengthening   Cybex Knee Extension 25 lbs, 2x15   Cybex Knee Flexion 45 lbs, 2x15     Knee/Hip Exercises: Seated   Clamshell with TheraBand Blue  seated, feet together, 3x10   Sit to Sand 10 reps     Ankle Exercises: Seated   Ankle Circles/Pumps AROM;Both;20 reps  clockwise, counterclockwise, ankle pumps  PT Education - 08/03/16 0934    Education provided Yes   Education Details HEP: ankle circles, ankle pumps   Person(s) Educated Patient   Methods Handout;Explanation   Comprehension Verbalized understanding          PT Short Term Goals - 07/30/16 0837      PT SHORT TERM GOAL #1   Status Achieved           PT Long Term Goals - 07/30/16 0837      PT LONG TERM GOAL #1   Status Achieved     PT LONG TERM GOAL #2   Status On-going     PT LONG TERM GOAL #3   Status On-going               Plan - 08/03/16 0958    Clinical Impression Statement Patient tolerated treatment fair. He was able to complete all strengthening exercises however balance exercises were very difficult to perfrom. Patient was unsteady and a gait belt was used for assistance with contact  guard. Patient states that his braces make him "more off balance" and he actually stands straighter and more steady without his braces. Patient stood for 30 seconds with stand by assist without the braces with no sway. With braces, patient was unable to stand 10 seconds wihtout falling back onto the table.  Ankle exercises were added today to work on lower leg muscles and ankle mobility.   Rehab Potential Good   PT Frequency 2x / week   PT Duration 8 weeks   PT Next Visit Plan ankle mobility, standing balance, LE strengthening   PT Home Exercise Plan Supine LE stretching, standing calf stretch, seated HS stretch   Consulted and Agree with Plan of Care Patient      Patient will benefit from skilled therapeutic intervention in order to improve the following deficits and impairments:  Abnormal gait, Decreased activity tolerance, Decreased balance, Decreased mobility, Decreased endurance, Decreased range of motion, Decreased strength, Difficulty walking, Impaired flexibility, Pain  Visit Diagnosis: Acute pain of right knee  Difficulty in walking, not elsewhere classified  Muscle weakness (generalized)     Problem List Patient Active Problem List   Diagnosis Date Noted  . Osteoporosis 03/13/2014  . Encounter for long-term (current) use of steroids 03/16/2013  . Primary male hypogonadism 10/19/2012  . Endocrinopathy 09/20/2012  . Gynecomastia, male 09/20/2012  . Fracture 09/20/2012  . Free monoclonal light chain   . Monoclonal (M) protein disease, multiple 'M' protein   . CIDP (chronic inflammatory demyelinating polyneuropathy) ([place]) 06/21/2012  . Constipation 06/20/2012  . Chronic inflammatory demyelinating polyneuropathy ([place]) 06/12/2012  . Acute urinary retention 06/12/2012  . Neurogenic bladder 06/12/2012  . Right sided weakness 06/07/2012  . Seizure disorder ([place]) 06/07/2012  . Pleural effusion on right 07/22/2011  . Tachycardia 07/22/2011  . Small bowel obstruction, s/p lap  LOA, c/b post op abscess 07/02/2011  . Coronary artery disease   . Hyperlipidemia     [name] [place], SPT 08/03/2016, 10:05 AM  [place] [address] [place], [place], [address] Phone: [phone]   Fax:  [phone]  Name: Brandon Ray MRN: [id number] Date of Birth: September 01, 1935

## 2016-08-03 NOTE — Progress Notes (Signed)
I changed the provider, did it work/help?

## 2016-08-05 DIAGNOSIS — M1711 Unilateral primary osteoarthritis, right knee: Secondary | ICD-10-CM | POA: Diagnosis not present

## 2016-08-06 ENCOUNTER — Ambulatory Visit: Payer: Medicare HMO | Admitting: Physical Therapy

## 2016-08-09 ENCOUNTER — Encounter: Payer: Self-pay | Admitting: Physical Therapy

## 2016-08-09 ENCOUNTER — Ambulatory Visit: Payer: Medicare HMO | Admitting: Physical Therapy

## 2016-08-09 DIAGNOSIS — R262 Difficulty in walking, not elsewhere classified: Secondary | ICD-10-CM | POA: Diagnosis not present

## 2016-08-09 DIAGNOSIS — M25561 Pain in right knee: Secondary | ICD-10-CM

## 2016-08-09 DIAGNOSIS — M6281 Muscle weakness (generalized): Secondary | ICD-10-CM | POA: Diagnosis not present

## 2016-08-09 NOTE — Therapy (Signed)
[place] [address] [place], [place], [address] Phone: [phone]   Fax:  [phone]  Physical Therapy Treatment  Patient Details  Name: Brandon Ray MRN: [id number] Date of Birth: 1935/05/08 Referring Provider: Veverly Fells  Encounter Date: 08/09/2016      PT End of Session - 08/09/16 0836    Visit Number 7   Date for PT Re-Evaluation 09/12/16   Equipment Utilized During Treatment Gait belt   Activity Tolerance Patient tolerated treatment well;No increased pain   Behavior During Therapy WFL for tasks assessed/performed      Past Medical History:  Diagnosis Date  . BPH (benign prostatic hypertrophy) 2014  . Coronary artery disease 01/2003   CABG  . Free monoclonal light chain    urine  . Hyperlipidemia   . Peripheral neuropathy ([place]) 05/2011    follow up with Dr. Tish Frederickson multiple issues  . Pleural effusion 05/2011   from abdominal surgery  . Seizures ([place]) 1950's   last one 1960  . Small bowel obstruction 05/2011   due to adhesion  . Weakness 2013-May began   lower extemities    Past Surgical History:  Procedure Laterality Date  . ABDOMINAL SURGERY  05/2011   bowel obstruction  . [place]   left  . CARDIAC CATHETERIZATION  01/27/2003   NORMAL LEFT VENTRICULAR SIZE WITH MILD FOCAL LATERAL WALL HYPOKINESIA. EF 60%  . CORONARY ARTERY BYPASS GRAFT  2004   LIMA GRAFT TO THE LAD, SAPHENOUS VEIN GRAFT TO THE DIAGONAL, SAPHENOUS VEIN GRAFT TO THE FIRST OM, SAPHENOUS VEIN GRAFT TO THE PDA  . HAND SURGERY  2000   left  . TRANSURETHRAL RESECTION OF PROSTATE  1992  . TRANSURETHRAL RESECTION OF PROSTATE N/A 12/22/2012   Procedure: TRANSURETHRAL RESECTION OF THE PROSTATE WITH GYRUS (TURP);  Surgeon: Fredricka Bonine, MD;  Location: [facility];  Service: Urology;  Laterality: N/A;    There were no vitals filed for this visit.      Subjective Assessment - 08/09/16  0750    Subjective Patient reports he is doing well. He received a cortisone shot in his right knee last Thusday. He states it "felt great" for a couple of days and it has started to "wear off"   Limitations Walking;Standing   Patient Stated Goals less knee pain, walk again without difficulty   Currently in Pain? No/denies   Pain Score 0-No pain   Pain Onset More than a month ago                         [facility] Adult PT Treatment/Exercise - 08/09/16 0001      Exercises   Exercises Ankle;Knee/Hip;Lumbar     Lumbar Exercises: Aerobic   Stationary Bike NuStep 10 minutes     Knee/Hip Exercises: Standing   Hip Abduction Stengthening;AROM;Left;Right;2 sets;20 reps   Wall Squat 20 reps  yellow tband around knees, red ball     Knee/Hip Exercises: Seated   Abduction/Adduction  Strengthening;AROM;Both;3 sets  10 reps     Modalities   Modalities Moist Heat     Moist Heat Therapy   Number Minutes Moist Heat 15 Minutes   Moist Heat Location Knee  bilateral, seated position     Ankle Exercises: Seated   Towel Inversion/Eversion 2 reps  each foot, each way   Other Seated Ankle Exercises PF yellow theraband 2x20  PT Education - 08/09/16 0836    Education provided No          PT Short Term Goals - 07/30/16 0837      PT SHORT TERM GOAL #1   Status Achieved           PT Long Term Goals - 07/30/16 0837      PT LONG TERM GOAL #1   Status Achieved     PT LONG TERM GOAL #2   Status On-going     PT LONG TERM GOAL #3   Status On-going               Plan - 08/09/16 0837    Clinical Impression Statement Patient tolerated treatment well. Ankle mobility was the focus today and patient was able to complete all exercises. Patient is eager to do everything he can to improve ankle mobility and lower leg muscles in hopes of improving his balance. Continue to progress per pt tolerance.   Rehab Potential Good   PT Frequency 2x / week    PT Duration 8 weeks   PT Treatment/Interventions ADLs/Self Care Home Management;Gait training;Stair training;Functional mobility training;Therapeutic activities;Therapeutic exercise;Neuromuscular re-education;Patient/family education;Manual techniques   PT Next Visit Plan Balance, LE strengthening, stretching   Consulted and Agree with Plan of Care Patient      Patient will benefit from skilled therapeutic intervention in order to improve the following deficits and impairments:  Abnormal gait, Decreased activity tolerance, Decreased balance, Decreased mobility, Decreased endurance, Decreased range of motion, Decreased strength, Difficulty walking, Impaired flexibility, Pain  Visit Diagnosis: Acute pain of right knee  Difficulty in walking, not elsewhere classified  Muscle weakness (generalized)     Problem List Patient Active Problem List   Diagnosis Date Noted  . Osteoporosis 03/13/2014  . Encounter for long-term (current) use of steroids 03/16/2013  . Primary male hypogonadism 10/19/2012  . Endocrinopathy 09/20/2012  . Gynecomastia, male 09/20/2012  . Fracture 09/20/2012  . Free monoclonal light chain   . Monoclonal (M) protein disease, multiple 'M' protein   . CIDP (chronic inflammatory demyelinating polyneuropathy) ([place]) 06/21/2012  . Constipation 06/20/2012  . Chronic inflammatory demyelinating polyneuropathy ([place]) 06/12/2012  . Acute urinary retention 06/12/2012  . Neurogenic bladder 06/12/2012  . Right sided weakness 06/07/2012  . Seizure disorder ([place]) 06/07/2012  . Pleural effusion on right 07/22/2011  . Tachycardia 07/22/2011  . Small bowel obstruction, s/p lap LOA, c/b post op abscess 07/02/2011  . Coronary artery disease   . Hyperlipidemia     [name] [place], SPT 08/09/2016, 8:47 AM  [place] [address] [place], [place], [address] Phone: [phone]   Fax:  [phone]  Name: Brandon Ray MRN: [id number] Date of Birth: 16-Apr-1935

## 2016-08-11 ENCOUNTER — Encounter: Payer: Self-pay | Admitting: Physical Therapy

## 2016-08-11 ENCOUNTER — Ambulatory Visit: Payer: Medicare HMO | Admitting: Physical Therapy

## 2016-08-11 DIAGNOSIS — M25561 Pain in right knee: Secondary | ICD-10-CM

## 2016-08-11 DIAGNOSIS — M6281 Muscle weakness (generalized): Secondary | ICD-10-CM

## 2016-08-11 DIAGNOSIS — R262 Difficulty in walking, not elsewhere classified: Secondary | ICD-10-CM

## 2016-08-11 NOTE — Therapy (Signed)
[place] [address] [place], [place], [address] Phone: [phone]   Fax:  [phone]  Physical Therapy Treatment  Patient Details  Name: Brandon Ray MRN: [id number] Date of Birth: 19-Jan-1935 Referring Provider: Veverly Fells  Encounter Date: 08/11/2016      PT End of Session - 08/11/16 0851    Visit Number 8   Date for PT Re-Evaluation 09/12/16   PT Start Time 0750   PT Stop Time 0840   PT Time Calculation (min) 50 min   Activity Tolerance Patient tolerated treatment well;No increased pain   Behavior During Therapy WFL for tasks assessed/performed      Past Medical History:  Diagnosis Date  . BPH (benign prostatic hypertrophy) 2014  . Coronary artery disease 01/2003   CABG  . Free monoclonal light chain    urine  . Hyperlipidemia   . Peripheral neuropathy ([place]) 05/2011    follow up with Dr. Tish Frederickson multiple issues  . Pleural effusion 05/2011   from abdominal surgery  . Seizures ([place]) 1950's   last one 1960  . Small bowel obstruction 05/2011   due to adhesion  . Weakness 2013-May began   lower extemities    Past Surgical History:  Procedure Laterality Date  . ABDOMINAL SURGERY  05/2011   bowel obstruction  . [place]   left  . CARDIAC CATHETERIZATION  01/27/2003   NORMAL LEFT VENTRICULAR SIZE WITH MILD FOCAL LATERAL WALL HYPOKINESIA. EF 60%  . CORONARY ARTERY BYPASS GRAFT  2004   LIMA GRAFT TO THE LAD, SAPHENOUS VEIN GRAFT TO THE DIAGONAL, SAPHENOUS VEIN GRAFT TO THE FIRST OM, SAPHENOUS VEIN GRAFT TO THE PDA  . HAND SURGERY  2000   left  . TRANSURETHRAL RESECTION OF PROSTATE  1992  . TRANSURETHRAL RESECTION OF PROSTATE N/A 12/22/2012   Procedure: TRANSURETHRAL RESECTION OF THE PROSTATE WITH GYRUS (TURP);  Surgeon: Fredricka Bonine, MD;  Location: [facility];  Service: Urology;  Laterality: N/A;    There were no vitals filed for this visit.       Subjective Assessment - 08/11/16 0855    Currently in Pain? No/denies                         [facility] Adult PT Treatment/Exercise - 08/11/16 0001      High Level Balance   High Level Balance Activities Side stepping  Wedding walks, High knees     Lumbar Exercises: Aerobic   Stationary Bike NuStep, 10 minutes, lvl 7     Knee/Hip Exercises: Stretches   Active Hamstring Stretch 2 reps;30 seconds   Gastroc Stretch 2 reps;Left;Right;30 seconds     Knee/Hip Exercises: Machines for Strengthening   Cybex Knee Extension 25 lbs, 3x10   Cybex Knee Flexion 35lbs, 3x10     Knee/Hip Exercises: Standing   Wall Squat 2 sets;20 reps  red ball, red tband around knees     Modalities   Modalities Moist Heat     Moist Heat Therapy   Number Minutes Moist Heat 10 Minutes   Moist Heat Location Knee     Manual Therapy   Manual Therapy Passive ROM   Manual therapy comments IT band, HS stretch                PT Education - 08/11/16 0851    Education provided Yes   Education Details Educated pt on the effects of  vlagus knees, gave pt IT band stretch and ankle 4 way exercise for home   Person(s) Educated Patient   Methods Explanation;Handout;Demonstration;Verbal cues   Comprehension Verbalized understanding          PT Short Term Goals - 07/30/16 0837      PT SHORT TERM GOAL #1   Status Achieved           PT Long Term Goals - 07/30/16 0837      PT LONG TERM GOAL #1   Status Achieved     PT LONG TERM GOAL #2   Status On-going     PT LONG TERM GOAL #3   Status On-going               Plan - 08/11/16 8315    Clinical Impression Statement Pt tolerated treatment well and was able to complete all exercises. Pt continues to show balance deficits and reports balance issues with his DF assist braces when he performs exercises such as wedding walks and static stanidng. Educated pt on the effects of valgus knees and why he has been experiencing the  medial knee pain. Continue to progress per pt tolerance.   Rehab Potential Good   PT Frequency 2x / week   PT Duration 8 weeks   PT Treatment/Interventions ADLs/Self Care Home Management;Gait training;Stair training;Functional mobility training;Therapeutic activities;Therapeutic exercise;Neuromuscular re-education;Patient/family education;Manual techniques   PT Next Visit Plan Balance, LE strengthening, stretching, Take objective measures (6 minutes walk test, timed up and go, 5x sit to stand)   PT Home Exercise Plan Supine LE stretching, standing calf stretch, seated HS stretch   Consulted and Agree with Plan of Care Patient      Patient will benefit from skilled therapeutic intervention in order to improve the following deficits and impairments:  Abnormal gait, Decreased activity tolerance, Decreased balance, Decreased mobility, Decreased endurance, Decreased range of motion, Decreased strength, Difficulty walking, Impaired flexibility, Pain  Visit Diagnosis: Acute pain of right knee  Difficulty in walking, not elsewhere classified  Muscle weakness (generalized)     Problem List Patient Active Problem List   Diagnosis Date Noted  . Osteoporosis 03/13/2014  . Encounter for long-term (current) use of steroids 03/16/2013  . Primary male hypogonadism 10/19/2012  . Endocrinopathy 09/20/2012  . Gynecomastia, male 09/20/2012  . Fracture 09/20/2012  . Free monoclonal light chain   . Monoclonal (M) protein disease, multiple 'M' protein   . CIDP (chronic inflammatory demyelinating polyneuropathy) ([place]) 06/21/2012  . Constipation 06/20/2012  . Chronic inflammatory demyelinating polyneuropathy ([place]) 06/12/2012  . Acute urinary retention 06/12/2012  . Neurogenic bladder 06/12/2012  . Right sided weakness 06/07/2012  . Seizure disorder ([place]) 06/07/2012  . Pleural effusion on right 07/22/2011  . Tachycardia 07/22/2011  . Small bowel obstruction, s/p lap LOA, c/b post op abscess  07/02/2011  . Coronary artery disease   . Hyperlipidemia     Brandon Ray [place], SPT 08/11/2016, 8:58 AM  [place] [address] [place], [place], [address] Phone: [phone]   Fax:  [phone]  Name: Brandon Ray MRN: [id number] Date of Birth: 31-May-1935

## 2016-08-17 ENCOUNTER — Encounter: Payer: Self-pay | Admitting: Physical Therapy

## 2016-08-17 ENCOUNTER — Ambulatory Visit: Payer: Medicare HMO | Admitting: Physical Therapy

## 2016-08-17 DIAGNOSIS — R262 Difficulty in walking, not elsewhere classified: Secondary | ICD-10-CM | POA: Diagnosis not present

## 2016-08-17 DIAGNOSIS — M25561 Pain in right knee: Secondary | ICD-10-CM

## 2016-08-17 DIAGNOSIS — M6281 Muscle weakness (generalized): Secondary | ICD-10-CM | POA: Diagnosis not present

## 2016-08-17 NOTE — Therapy (Signed)
[facility] [address] [place], [place], [address] Phone: [phone]   Fax:  [phone]  Physical Therapy Treatment  Patient Details  Name: Brandon Ray MRN: [id number] Date of Birth: February 18, 1935 Referring Provider: Ranell Patrick  Encounter Date: 08/17/2016      PT End of Session - 08/17/16 0850    Visit Number 9   Date for PT Re-Evaluation 09/12/16   PT Start Time 0750   PT Stop Time 0840   PT Time Calculation (min) 50 min   Activity Tolerance Patient tolerated treatment well;No increased pain   Behavior During Therapy WFL for tasks assessed/performed      Past Medical History:  Diagnosis Date  . BPH (benign prostatic hypertrophy) 2014  . Coronary artery disease 01/2003   CABG  . Free monoclonal light chain    urine  . Hyperlipidemia   . Peripheral neuropathy (HCC) 05/2011    follow up with Dr. Hart Carwin multiple issues  . Pleural effusion 05/2011   from abdominal surgery  . Seizures (HCC) 1950's   last one 1960  . Small bowel obstruction 05/2011   due to adhesion  . Weakness 2013-May began   lower extemities    Past Surgical History:  Procedure Laterality Date  . ABDOMINAL SURGERY  05/2011   bowel obstruction  . ANKLE FRACTURE SURGERY  1997   left  . CARDIAC CATHETERIZATION  01/27/2003   NORMAL LEFT VENTRICULAR SIZE WITH MILD FOCAL LATERAL WALL HYPOKINESIA. EF 60%  . CORONARY ARTERY BYPASS GRAFT  2004   LIMA GRAFT TO THE LAD, SAPHENOUS VEIN GRAFT TO THE DIAGONAL, SAPHENOUS VEIN GRAFT TO THE FIRST OM, SAPHENOUS VEIN GRAFT TO THE PDA  . HAND SURGERY  2000   left  . TRANSURETHRAL RESECTION OF PROSTATE  1992  . TRANSURETHRAL RESECTION OF PROSTATE N/A 12/22/2012   Procedure: TRANSURETHRAL RESECTION OF THE PROSTATE WITH GYRUS (TURP);  Surgeon: Antony Haste, MD;  Location: [facility];  Service: Urology;  Laterality: N/A;    There were no vitals filed for this visit.       Subjective Assessment - 08/17/16 0753    Subjective Patient states he is having no pain today. He says he did all of his stretches and exercises at home over the break.   Limitations Walking;Standing   Patient Stated Goals less knee pain, walk again without difficulty   Currently in Pain? No/denies            [facility] PT Assessment - 08/17/16 0001      Strength   Overall Strength Comments hip flexion bilaterally: 4+/5, Abd/Add: 4+/5, knee flexion 5/5, knee extension 5/5, performed 10 toe raises with hands on walls                      [facility] Adult PT Treatment/Exercise - 08/17/16 0001      Ambulation/Gait   Ambulation/Gait Yes   Gait Comments 6 minute walk test  1,040 feet walked, no AD     Lumbar Exercises: Aerobic   Stationary Bike NuStep 5 minutes lvl 6     Knee/Hip Exercises: Stretches   Gastroc Stretch 2 reps;Left;Right;30 seconds     Knee/Hip Exercises: Standing   Wall Squat 10 reps;3 sets     Knee/Hip Exercises: Supine   Hip Adduction Isometric Strengthening;Both;3 sets;10 reps  with green ball squeeze   Other Supine Knee/Hip Exercises clamshell with red band 3x10     Manual Therapy  Manual Therapy Passive ROM;Taping   Manual therapy comments IT Band, HS stretch   Kinesiotex Create Space;Ligament Correction     Kinesiotix   Create Space lateral to medial right knee to help correct valgus position                PT Education - 08/17/16 0849    Education provided No          PT Short Term Goals - 07/30/16 0837      PT SHORT TERM GOAL #1   Status Achieved           PT Long Term Goals - 08/17/16 0855      PT LONG TERM GOAL #2   Status Achieved               Plan - 08/17/16 0850    Clinical Impression Statement Patient tolerated treatment well and was able to complete all exercises. Pt performed a 6 minute walk test and was able to complete the full test without rest breaks and no AD. Pt has improved hip and knee  strenght significantly and he reports that he feels much better since starting therapy. Kinesiotape was used today to help inhibit valgus knees and give extra support to the medial aspect of right knee. Continue to progress per pt tolerance.   Rehab Potential Good   PT Frequency 2x / week   PT Duration 8 weeks   PT Treatment/Interventions ADLs/Self Care Home Management;Gait training;Stair training;Functional mobility training;Therapeutic activities;Therapeutic exercise;Neuromuscular re-education;Patient/family education;Manual techniques   PT Next Visit Plan Balance, LE strengthening, stretching, IT band stretch   PT Home Exercise Plan Supine LE stretching, standing calf stretch, seated HS stretch   Consulted and Agree with Plan of Care Patient      Patient will benefit from skilled therapeutic intervention in order to improve the following deficits and impairments:  Abnormal gait, Decreased activity tolerance, Decreased balance, Decreased mobility, Decreased endurance, Decreased range of motion, Decreased strength, Difficulty walking, Impaired flexibility, Pain  Visit Diagnosis: Acute pain of right knee  Difficulty in walking, not elsewhere classified  Muscle weakness (generalized)     Problem List Patient Active Problem List   Diagnosis Date Noted  . Osteoporosis 03/13/2014  . Encounter for long-term (current) use of steroids 03/16/2013  . Primary male hypogonadism 10/19/2012  . Endocrinopathy 09/20/2012  . Gynecomastia, male 09/20/2012  . Fracture 09/20/2012  . Free monoclonal light chain   . Monoclonal (M) protein disease, multiple 'M' protein   . CIDP (chronic inflammatory demyelinating polyneuropathy) ([place]) 06/21/2012  . Constipation 06/20/2012  . Chronic inflammatory demyelinating polyneuropathy ([place]) 06/12/2012  . Acute urinary retention 06/12/2012  . Neurogenic bladder 06/12/2012  . Right sided weakness 06/07/2012  . Seizure disorder ([place]) 06/07/2012  . Pleural  effusion on right 07/22/2011  . Tachycardia 07/22/2011  . Small bowel obstruction, s/p lap LOA, c/b post op abscess 07/02/2011  . Coronary artery disease   . Hyperlipidemia     Brandon Ray [place], SPT 08/17/2016, 8:57 AM  [address] [place], [place], [address] Phone: [phone]   Fax:  [phone]  Name: ANEL PUROHIT MRN: [id number] Date of Birth: 1935-09-08

## 2016-08-20 ENCOUNTER — Ambulatory Visit: Payer: Medicare HMO | Attending: Orthopedic Surgery | Admitting: Physical Therapy

## 2016-08-20 ENCOUNTER — Encounter: Payer: Self-pay | Admitting: Physical Therapy

## 2016-08-20 DIAGNOSIS — M25561 Pain in right knee: Secondary | ICD-10-CM | POA: Diagnosis not present

## 2016-08-20 DIAGNOSIS — M6281 Muscle weakness (generalized): Secondary | ICD-10-CM | POA: Diagnosis present

## 2016-08-20 DIAGNOSIS — R262 Difficulty in walking, not elsewhere classified: Secondary | ICD-10-CM | POA: Insufficient documentation

## 2016-08-20 NOTE — Therapy (Signed)
[place] [place] [employer] [place], [place], [address] Phone: [phone]   Fax:  [phone]  Physical Therapy Treatment  Patient Details  Name: Brandon Ray MRN: [id number] Date of Birth: [birth year] Referring Provider: Veverly Fells  Encounter Date: 08/20/2016      PT End of Session - 08/20/16 0901    Visit Number 10   Date for PT Re-Evaluation 09/12/16   PT Start Time 0102   PT Stop Time 0850   PT Time Calculation (min) 55 min   Activity Tolerance Patient tolerated treatment well;No increased pain   Behavior During Therapy WFL for tasks assessed/performed      Past Medical History:  Diagnosis Date  . BPH (benign prostatic hypertrophy) 2014  . Coronary artery disease 01/2003   CABG  . Free monoclonal light chain    urine  . Hyperlipidemia   . Peripheral neuropathy ([place]) 05/2011    follow up with Dr. Tish Frederickson multiple issues  . Pleural effusion 05/2011   from abdominal surgery  . Seizures ([place]) 1950's   last one 1960  . Small bowel obstruction 05/2011   due to adhesion  . Weakness 2013-May began   lower extemities    Past Surgical History:  Procedure Laterality Date  . ABDOMINAL SURGERY  05/2011   bowel obstruction  . [place]   left  . CARDIAC CATHETERIZATION  01/27/2003   NORMAL LEFT VENTRICULAR SIZE WITH MILD FOCAL LATERAL WALL HYPOKINESIA. EF 60%  . CORONARY ARTERY BYPASS GRAFT  2004   LIMA GRAFT TO THE LAD, SAPHENOUS VEIN GRAFT TO THE DIAGONAL, SAPHENOUS VEIN GRAFT TO THE FIRST OM, SAPHENOUS VEIN GRAFT TO THE PDA  . HAND SURGERY  2000   left  . TRANSURETHRAL RESECTION OF PROSTATE  1992  . TRANSURETHRAL RESECTION OF PROSTATE N/A 12/22/2012   Procedure: TRANSURETHRAL RESECTION OF THE PROSTATE WITH GYRUS (TURP);  Surgeon: Fredricka Bonine, MD;  Location[facility];  Service: Urology;  Laterality: N/A;    There were no vitals filed for this visit.       Subjective Assessment - 08/20/16 0755    Subjective Pt states that he is doing well. The only area in which he is feeling any discomfort is in the right hip. He reports that the taping we tried last treatment seemed to help or atleast "remind" him of the proper form at the knee.   Currently in Pain? No/denies   Pain Score 0-No pain  when hip is hurting it is a 2/10                         [facility] Adult PT Treatment/Exercise - 08/20/16 0001      Lumbar Exercises: Aerobic   Stationary Bike NuStep 8 min     Knee/Hip Exercises: Machines for Strengthening   Cybex Knee Extension 35 lbs, 3x10   Cybex Knee Flexion 45 lbs, 3x10     Knee/Hip Exercises: Standing   Heel Raises Both;2 sets;20 reps   Hip Abduction Stengthening;AROM;Right;Left;3 sets;10 reps   Wall Squat 10 reps;3 sets     Modalities   Modalities Social worker Location ant. tib   Scientist, research (life sciences) Goals Strength;Tone;Neuromuscular facilitation     Manual Therapy   Manual Therapy Passive ROM;Taping   Passive ROM DF, PF inversion/eversion   Kinesiotex Create Space;Ligament Correction  Leukotape     Ankle Exercises: Seated   BAPS Sitting;Other (comment)  20 front to back, side to side. right ankle easier   Other Seated Ankle Exercises --   Other Seated Ankle Exercises resisted 4 way ankle with yellow tband                PT Education - 08/20/16 0900    Education provided Yes   Education Details Educated pt on myotome patterns and dermatome patterns   Person(s) Educated Patient   Methods Explanation   Comprehension Verbalized understanding          PT Short Term Goals - 07/30/16 0837      PT SHORT TERM GOAL #1   Status Achieved           PT Long Term Goals - 08/17/16 0855      PT LONG TERM GOAL #2   Status Achieved               Plan - 08/20/16 0901    Clinical  Impression Statement Pt tolerated treatment well and was able to complete all exercises without increased symptoms. Pt was unable to complete side to side and clockwise/counterclockwise BAPS board exercise with left however he is able to with the right. Pt attributes this to using the right ankle while driving. Russian stim was utilized on the anterior tibialis muscle on the right leg in order to try and facilitate increased strength and tone. Pt voiced a desire to improve DF. Continue to progress per pt tolerance.   Rehab Potential Good   PT Frequency 2x / week   PT Duration 8 weeks   PT Treatment/Interventions ADLs/Self Care Home Management;Gait training;Stair training;Functional mobility training;Therapeutic activities;Therapeutic exercise;Neuromuscular re-education;Patient/family education;Manual techniques   PT Next Visit Plan Balance, LE strengthening, stretching, IT band stretch, Russian stim, leuko tape   Consulted and Agree with Plan of Care Patient      Patient will benefit from skilled therapeutic intervention in order to improve the following deficits and impairments:  Abnormal gait, Decreased activity tolerance, Decreased balance, Decreased mobility, Decreased endurance, Decreased range of motion, Decreased strength, Difficulty walking, Impaired flexibility, Pain  Visit Diagnosis: Acute pain of right knee  Difficulty in walking, not elsewhere classified  Muscle weakness (generalized)     Problem List Patient Active Problem List   Diagnosis Date Noted  . Osteoporosis 03/13/2014  . Encounter for long-term (current) use of steroids 03/16/2013  . Primary male hypogonadism 10/19/2012  . Endocrinopathy 09/20/2012  . Gynecomastia, male 09/20/2012  . Fracture 09/20/2012  . Free monoclonal light chain   . Monoclonal (M) protein disease, multiple 'M' protein   . CIDP (chronic inflammatory demyelinating polyneuropathy) ([place]) 06/21/2012  . Constipation 06/20/2012  . Chronic  inflammatory demyelinating polyneuropathy ([place]) 06/12/2012  . Acute urinary retention 06/12/2012  . Neurogenic bladder 06/12/2012  . Right sided weakness 06/07/2012  . Seizure disorder ([place]) 06/07/2012  . Pleural effusion on right 07/22/2011  . Tachycardia 07/22/2011  . Small bowel obstruction, s/p lap LOA, c/b post op abscess 07/02/2011  . Coronary artery disease   . Hyperlipidemia     Toy Baker 08/20/2016, [address] [place], [place], [address] Phone: [phone]   Fax:  [phone]  Name: Brandon Ray MRN: [id number] Date of Birth: 09-Jan-1935

## 2016-08-21 ENCOUNTER — Other Ambulatory Visit: Payer: Self-pay | Admitting: Internal Medicine

## 2016-08-24 ENCOUNTER — Ambulatory Visit: Payer: Medicare HMO | Admitting: Physical Therapy

## 2016-08-27 ENCOUNTER — Ambulatory Visit: Payer: Medicare HMO | Admitting: Physical Therapy

## 2016-08-27 ENCOUNTER — Encounter: Payer: Self-pay | Admitting: Physical Therapy

## 2016-08-27 DIAGNOSIS — M6281 Muscle weakness (generalized): Secondary | ICD-10-CM

## 2016-08-27 DIAGNOSIS — M25561 Pain in right knee: Secondary | ICD-10-CM

## 2016-08-27 DIAGNOSIS — R262 Difficulty in walking, not elsewhere classified: Secondary | ICD-10-CM

## 2016-08-27 NOTE — Therapy (Signed)
[address] [place], [place], [address] Phone: [phone]   Fax:  [phone]  Physical Therapy Treatment  Patient Details  Name: Brandon Ray MRN: [id number] Date of Birth: March 18, 1935 Referring Provider: Veverly Fells  Encounter Date: 08/27/2016      PT End of Session - 08/27/16 0853    Visit Number 11   Date for PT Re-Evaluation 09/12/16   PT Start Time 0753   PT Stop Time 0840   PT Time Calculation (min) 47 min   Activity Tolerance Patient tolerated treatment well;No increased pain   Behavior During Therapy WFL for tasks assessed/performed      Past Medical History:  Diagnosis Date  . BPH (benign prostatic hypertrophy) 2014  . Coronary artery disease 01/2003   CABG  . Free monoclonal light chain    urine  . Hyperlipidemia   . Peripheral neuropathy ([place]) 05/2011    follow up with Dr. Tish Frederickson multiple issues  . Pleural effusion 05/2011   from abdominal surgery  . Seizures ([place]) 1950's   last one 1960  . Small bowel obstruction 05/2011   due to adhesion  . Weakness 2013-May began   lower extemities    Past Surgical History:  Procedure Laterality Date  . ABDOMINAL SURGERY  05/2011   bowel obstruction  . [place]   left  . CARDIAC CATHETERIZATION  01/27/2003   NORMAL LEFT VENTRICULAR SIZE WITH MILD FOCAL LATERAL WALL HYPOKINESIA. EF 60%  . CORONARY ARTERY BYPASS GRAFT  2004   LIMA GRAFT TO THE LAD, SAPHENOUS VEIN GRAFT TO THE DIAGONAL, SAPHENOUS VEIN GRAFT TO THE FIRST OM, SAPHENOUS VEIN GRAFT TO THE PDA  . HAND SURGERY  2000   left  . TRANSURETHRAL RESECTION OF PROSTATE  1992  . TRANSURETHRAL RESECTION OF PROSTATE N/A 12/22/2012   Procedure: TRANSURETHRAL RESECTION OF THE PROSTATE WITH GYRUS (TURP);  Surgeon: Fredricka Bonine, MD;  Location: [facility];  Service: Urology;  Laterality: N/A;    There were no vitals filed for this visit.       Subjective Assessment - 08/27/16 0759    Subjective Pt states he is doing better since having to cancel his last appointment. He states that he had some bad side effects to his medications. Today, he reports that his pain has transferred from the knee to the hip.   Currently in Pain? No/denies                         [facility] Adult PT Treatment/Exercise - 08/27/16 0001      Lumbar Exercises: Aerobic   Stationary Bike [employer], 6 minutes, lvl 3     Knee/Hip Exercises: Stretches   Gastroc Stretch 2 reps;Left;Right;30 seconds     Knee/Hip Exercises: Standing   Hip Abduction Stengthening;AROM;2 sets;10 reps;Right;Left   Wall Squat 10 reps;3 sets   Other Standing Knee Exercises lateral side steps with red tband around ankles, 3x down and back     Knee/Hip Exercises: Supine   Hip Adduction Isometric Strengthening;Both;2 sets  2x10, hold 5 secs, green ball   Other Supine Knee/Hip Exercises clamshell with red tband, 3x10     Manual Therapy   Manual Therapy Passive ROM;Taping   Passive ROM ITB, HS, piriformis stretch                PT Education - 08/27/16 0853    Education provided No  PT Short Term Goals - 07/30/16 0837      PT SHORT TERM GOAL #1   Status Achieved           PT Long Term Goals - 08/17/16 0855      PT LONG TERM GOAL #2   Status Achieved               Plan - 08/27/16 0853    Clinical Impression Statement Pt tolerated treatment well and was able to complete all exercises. Pt reports more pain in the hip region today than the knees so treatment focussed on hip flexibility and strengthening. Pt perfromed wall squats with red physioball as his last exercise and reported increased hip pain. Manual pressure was placed on the medial aspect of his right knee and he reported a decrease in symptoms. Kinesio tape was placed on the medial aspect of the knee with 50-75% pressure. Pt then perfromed wall squat again and reports  decreased symptoms and positive benefit. Progress per pt tolerance.    Rehab Potential Good   PT Frequency 2x / week   PT Duration 8 weeks   PT Treatment/Interventions ADLs/Self Care Home Management;Gait training;Stair training;Functional mobility training;Therapeutic activities;Therapeutic exercise;Neuromuscular re-education;Patient/family education;Manual techniques   PT Next Visit Plan Balance, LE strengthening, stretching, IT band stretch, Russian stim, kinesio tape on medial rt knee   PT Home Exercise Plan Supine LE stretching, standing calf stretch, seated HS stretch   Consulted and Agree with Plan of Care Patient      Patient will benefit from skilled therapeutic intervention in order to improve the following deficits and impairments:  Abnormal gait, Decreased activity tolerance, Decreased balance, Decreased mobility, Decreased endurance, Decreased range of motion, Decreased strength, Difficulty walking, Impaired flexibility, Pain  Visit Diagnosis: Acute pain of right knee  Difficulty in walking, not elsewhere classified  Muscle weakness (generalized)     Problem List Patient Active Problem List   Diagnosis Date Noted  . Osteoporosis 03/13/2014  . Encounter for long-term (current) use of steroids 03/16/2013  . Primary male hypogonadism 10/19/2012  . Endocrinopathy 09/20/2012  . Gynecomastia, male 09/20/2012  . Fracture 09/20/2012  . Free monoclonal light chain   . Monoclonal (M) protein disease, multiple 'M' protein   . CIDP (chronic inflammatory demyelinating polyneuropathy) ([place]) 06/21/2012  . Constipation 06/20/2012  . Chronic inflammatory demyelinating polyneuropathy ([place]) 06/12/2012  . Acute urinary retention 06/12/2012  . Neurogenic bladder 06/12/2012  . Right sided weakness 06/07/2012  . Seizure disorder ([place]) 06/07/2012  . Pleural effusion on right 07/22/2011  . Tachycardia 07/22/2011  . Small bowel obstruction, s/p lap LOA, c/b post op abscess 07/02/2011  .  Coronary artery disease   . Hyperlipidemia     [name] [place], SPT 08/27/2016, 9:04 AM  [address] [place], [place], [address] Phone: [phone]   Fax:  [phone]  Name: Brandon Ray MRN: [id number] Date of Birth: 07-03-1935

## 2016-08-31 ENCOUNTER — Ambulatory Visit: Payer: Medicare HMO | Admitting: Physical Therapy

## 2016-08-31 ENCOUNTER — Encounter: Payer: Self-pay | Admitting: Physical Therapy

## 2016-08-31 DIAGNOSIS — R262 Difficulty in walking, not elsewhere classified: Secondary | ICD-10-CM

## 2016-08-31 DIAGNOSIS — M6281 Muscle weakness (generalized): Secondary | ICD-10-CM

## 2016-08-31 DIAGNOSIS — M25561 Pain in right knee: Secondary | ICD-10-CM | POA: Diagnosis not present

## 2016-08-31 NOTE — Therapy (Signed)
[address] [place], [place], [address] Phone: [phone]   Fax:  [phone]  Physical Therapy Treatment  Patient Details  Name: Brandon Ray MRN: [id number] Date of Birth: 15-Apr-1935 Referring Provider: Veverly Fells  Encounter Date: 08/31/2016      PT End of Session - 08/31/16 0845    Visit Number 12   Date for PT Re-Evaluation 09/12/16   PT Start Time 0800   PT Stop Time 7425   PT Time Calculation (min) 42 min   Activity Tolerance Patient tolerated treatment well;No increased pain   Behavior During Therapy WFL for tasks assessed/performed      Past Medical History:  Diagnosis Date  . BPH (benign prostatic hypertrophy) 2014  . Coronary artery disease 01/2003   CABG  . Free monoclonal light chain    urine  . Hyperlipidemia   . Peripheral neuropathy ([place]) 05/2011    follow up with Dr. Tish Frederickson multiple issues  . Pleural effusion 05/2011   from abdominal surgery  . Seizures ([place]) 1950's   last one 1960  . Small bowel obstruction 05/2011   due to adhesion  . Weakness 2013-May began   lower extemities    Past Surgical History:  Procedure Laterality Date  . ABDOMINAL SURGERY  05/2011   bowel obstruction  . [place]   left  . CARDIAC CATHETERIZATION  01/27/2003   NORMAL LEFT VENTRICULAR SIZE WITH MILD FOCAL LATERAL WALL HYPOKINESIA. EF 60%  . CORONARY ARTERY BYPASS GRAFT  2004   LIMA GRAFT TO THE LAD, SAPHENOUS VEIN GRAFT TO THE DIAGONAL, SAPHENOUS VEIN GRAFT TO THE FIRST OM, SAPHENOUS VEIN GRAFT TO THE PDA  . HAND SURGERY  2000   left  . TRANSURETHRAL RESECTION OF PROSTATE  1992  . TRANSURETHRAL RESECTION OF PROSTATE N/A 12/22/2012   Procedure: TRANSURETHRAL RESECTION OF THE PROSTATE WITH GYRUS (TURP);  Surgeon: Fredricka Bonine, MD;  [facility];  Service: Urology;  Laterality: N/A;    There were no vitals filed for this visit.       Subjective Assessment - 08/31/16 0800    Subjective Pt reports that things are going well except he is going through a medicine transition.    Pain Score 3    Pain Location Hip   Pain Orientation Right                         [facility] Adult PT Treatment/Exercise - 08/31/16 0001      Lumbar Exercises: Aerobic   Stationary Bike [employer], 7 minutes, lvl 2     Lumbar Exercises: Supine   Bridge 2 seconds;15 reps  x2    Other Supine Lumbar Exercises hip abd/add black tband 2x15     Knee/Hip Exercises: Machines for Strengthening   Cybex Knee Extension 35 lbs, 3x10   Cybex Knee Flexion 45 lbs, 3x10   Cybex Leg Press 50lb 2x15, green Tband around LE      Knee/Hip Exercises: Standing   Forward Step Up 1 set;Both;Step Height: 6";Hand Hold: 2  explosive     Knee/Hip Exercises: Seated   Sit to Sand 2 sets;10 reps;without UE support     Shoulder Exercises: Standing   Extension Theraband;15 reps  x2   Theraband Level (Shoulder Extension) Level 3 (Green)   Row 10 reps;Theraband  x2   Theraband Level (Shoulder Row) Level 3 (Green)     Manual Therapy  Manual Therapy Taping   Kinesiotex Facilitate Muscle                  PT Short Term Goals - 07/30/16 0837      PT SHORT TERM GOAL #1   Status Achieved           PT Long Term Goals - 08/17/16 0855      PT LONG TERM GOAL #2   Status Achieved               Plan - 08/31/16 0847    Clinical Impression Statement Pt able to complete all of today's interventions. Pt demos poor glut activation with sit to stand, pt having some difficulty bringing his hip forward with sit to stand. During leg press pr reports some medial R knee pain.    Rehab Potential Good   PT Frequency 2x / week   PT Duration 8 weeks   PT Treatment/Interventions ADLs/Self Care Home Management;Gait training;Stair training;Functional mobility training;Therapeutic activities;Therapeutic exercise;Neuromuscular  re-education;Patient/family education;Manual techniques   PT Next Visit Plan Balance, LE strengthening, stretching, IT band stretch, Russian stim, kinesio tape on medial rt knee      Patient will benefit from skilled therapeutic intervention in order to improve the following deficits and impairments:  Abnormal gait, Decreased activity tolerance, Decreased balance, Decreased mobility, Decreased endurance, Decreased range of motion, Decreased strength, Difficulty walking, Impaired flexibility, Pain  Visit Diagnosis: Acute pain of right knee  Difficulty in walking, not elsewhere classified  Muscle weakness (generalized)     Problem List Patient Active Problem List   Diagnosis Date Noted  . Osteoporosis 03/13/2014  . Encounter for long-term (current) use of steroids 03/16/2013  . Primary male hypogonadism 10/19/2012  . Endocrinopathy 09/20/2012  . Gynecomastia, male 09/20/2012  . Fracture 09/20/2012  . Free monoclonal light chain   . Monoclonal (M) protein disease, multiple 'M' protein   . CIDP (chronic inflammatory demyelinating polyneuropathy) ([place]) 06/21/2012  . Constipation 06/20/2012  . Chronic inflammatory demyelinating polyneuropathy ([place]) 06/12/2012  . Acute urinary retention 06/12/2012  . Neurogenic bladder 06/12/2012  . Right sided weakness 06/07/2012  . Seizure disorder ([place]) 06/07/2012  . Pleural effusion on right 07/22/2011  . Tachycardia 07/22/2011  . Small bowel obstruction, s/p lap LOA, c/b post op abscess 07/02/2011  . Coronary artery disease   . Hyperlipidemia     Scot Jun 08/31/2016, 8:50 AM  [address] [place], [place], [address] Phone: [phone]   Fax:  [phone]  Name: Brandon Ray MRN: [id number] Date of Birth: [birth year]

## 2016-09-03 ENCOUNTER — Encounter: Payer: Self-pay | Admitting: Physical Therapy

## 2016-09-03 ENCOUNTER — Ambulatory Visit: Payer: Medicare HMO | Admitting: Physical Therapy

## 2016-09-03 DIAGNOSIS — M25561 Pain in right knee: Secondary | ICD-10-CM | POA: Diagnosis not present

## 2016-09-03 DIAGNOSIS — R262 Difficulty in walking, not elsewhere classified: Secondary | ICD-10-CM

## 2016-09-03 DIAGNOSIS — M6281 Muscle weakness (generalized): Secondary | ICD-10-CM

## 2016-09-03 NOTE — Therapy (Signed)
[address] [place], [place], [address] Phone: [phone]   Fax:  [phone]  Physical Therapy Treatment  Patient Details  Name: Brandon Ray MRN: [id number] Date of Birth: [birth year] Referring Provider: Veverly Fells  Encounter Date: 09/03/2016      PT End of Session - 09/03/16 0844    Visit Number 13   PT Start Time 4270   PT Stop Time 6237   PT Time Calculation (min) 46 min   Activity Tolerance Patient tolerated treatment well;No increased pain   Behavior During Therapy WFL for tasks assessed/performed      Past Medical History:  Diagnosis Date  . BPH (benign prostatic hypertrophy) 2014  . Coronary artery disease 01/2003   CABG  . Free monoclonal light chain    urine  . Hyperlipidemia   . Peripheral neuropathy ([place]) 05/2011    follow up with Dr. Tish Frederickson multiple issues  . Pleural effusion 05/2011   from abdominal surgery  . Seizures ([place]) 1950's   last one 1960  . Small bowel obstruction 05/2011   due to adhesion  . Weakness 2013-May began   lower extemities    Past Surgical History:  Procedure Laterality Date  . ABDOMINAL SURGERY  05/2011   bowel obstruction  . [place]   left  . CARDIAC CATHETERIZATION  01/27/2003   NORMAL LEFT VENTRICULAR SIZE WITH MILD FOCAL LATERAL WALL HYPOKINESIA. EF 60%  . CORONARY ARTERY BYPASS GRAFT  2004   LIMA GRAFT TO THE LAD, SAPHENOUS VEIN GRAFT TO THE DIAGONAL, SAPHENOUS VEIN GRAFT TO THE FIRST OM, SAPHENOUS VEIN GRAFT TO THE PDA  . HAND SURGERY  2000   left  . TRANSURETHRAL RESECTION OF PROSTATE  1992  . TRANSURETHRAL RESECTION OF PROSTATE N/A 12/22/2012   Procedure: TRANSURETHRAL RESECTION OF THE PROSTATE WITH GYRUS (TURP);  Surgeon: Fredricka Bonine, MD;  Location: [facility];  Service: Urology;  Laterality: N/A;    There were no vitals filed for this visit.      Subjective Assessment - 09/03/16 0759     Subjective "Pretty good"   Currently in Pain? No/denies   Pain Score 0-No pain                         [facility] Adult PT Treatment/Exercise - 09/03/16 0001      Lumbar Exercises: Supine   Bridge 2 seconds;15 reps  x2   Other Supine Lumbar Exercises seated back ext black tband 2x15     Knee/Hip Exercises: Aerobic   Nustep NuStep L6x 109min      Knee/Hip Exercises: Machines for Strengthening   Cybex Knee Extension 35 lbs, 3x10   Cybex Knee Flexion 45 lbs, 3x10   Cybex Leg Press 50lb 3x10, green Tband around LE    Other Machine Fitter presses all bands x20 each     Knee/Hip Exercises: Standing   Hip ADduction Both;2 sets;5 reps   Hip ADduction Limitations 5   Hip Extension 10 reps;2 sets;Both   Extension Limitations 5   Other Standing Knee Exercises sit to stand from elevated UBE seat 3x5                   PT Short Term Goals - 07/30/16 0837      PT SHORT TERM GOAL #1   Status Achieved           PT Long Term Goals -  09/03/16 0814      PT LONG TERM GOAL #1   Title (P)  report pain in the knee decreased 25%     PT LONG TERM GOAL #2   Title (P)  increase hip strength to 4/5 for flexion   Status (P)  Achieved     PT LONG TERM GOAL #3   Title (P)  increase right knee extension to 5 degrees full extension   Status (P)  On-going     PT LONG TERM GOAL #4   Title (P)  increase right SLR to 65 degrees   Status (P)  On-going               Plan - 09/03/16 0844    Clinical Impression Statement Pt requires multiple attempts to performed is to stand, pt gets part of the way up but has trouble bringing his hips forward. Pt with a valgus stress on both LE R >L. No reports of medial knee pain with today's exercises.   Rehab Potential Good   PT Frequency 2x / week   PT Duration 8 weeks   PT Treatment/Interventions ADLs/Self Care Home Management;Gait training;Stair training;Functional mobility training;Therapeutic activities;Therapeutic  exercise;Neuromuscular re-education;Patient/family education;Manual techniques   PT Next Visit Plan Balance, LE strengthening, stretching, IT band stretch, Russian stim, kinesio tape on medial rt knee      Patient will benefit from skilled therapeutic intervention in order to improve the following deficits and impairments:  Abnormal gait, Decreased activity tolerance, Decreased balance, Decreased mobility, Decreased endurance, Decreased range of motion, Decreased strength, Difficulty walking, Impaired flexibility, Pain  Visit Diagnosis: Acute pain of right knee  Difficulty in walking, not elsewhere classified  Muscle weakness (generalized)     Problem List Patient Active Problem List   Diagnosis Date Noted  . Osteoporosis 03/13/2014  . Encounter for long-term (current) use of steroids 03/16/2013  . Primary male hypogonadism 10/19/2012  . Endocrinopathy 09/20/2012  . Gynecomastia, male 09/20/2012  . Fracture 09/20/2012  . Free monoclonal light chain   . Monoclonal (M) protein disease, multiple 'M' protein   . CIDP (chronic inflammatory demyelinating polyneuropathy) ([place]) 06/21/2012  . Constipation 06/20/2012  . Chronic inflammatory demyelinating polyneuropathy ([place]) 06/12/2012  . Acute urinary retention 06/12/2012  . Neurogenic bladder 06/12/2012  . Right sided weakness 06/07/2012  . Seizure disorder ([place]) 06/07/2012  . Pleural effusion on right 07/22/2011  . Tachycardia 07/22/2011  . Small bowel obstruction, s/p lap LOA, c/b post op abscess 07/02/2011  . Coronary artery disease   . Hyperlipidemia     Scot Jun 09/03/2016, 8:48 AM  [address] [place], [place], [address] Phone: [phone]   Fax:  [phone]  Name: THERIN VETSCH MRN: [id number] Date of Birth: February 24, 1935

## 2016-09-04 NOTE — Progress Notes (Signed)
[name] Date of Birth: 1935-06-14   History of Present Illness: Mr. Kentner is seen today for followup. He has a history of coronary disease and is status post CABG in 2004 after a NSTEMI. He had severe 3 vessel disease and normal LV function. He is still battling with severe polyneuropathy felt to be related to a vaccine. This has significantly limited his activity. He is no longer able to hike. He really denies any significant cardiac complaints. He denies chest pain, shortness of breath, or palpitations. He has no edema. He is seeing pain management at [facility] now. States neuropathy is worse. He did have arthroscopic knee surgery in April.  Current Outpatient Prescriptions on File Prior to Visit  Medication Sig Dispense Refill  . atorvastatin (LIPITOR) 20 MG tablet TAKE 1 TABLET(20 MG) BY MOUTH DAILY 90 tablet 0  . calcium-vitamin D (OSCAL WITH D) 500-200 MG-UNIT per tablet Take 1 tablet by mouth daily.      . Cholecalciferol 2000 UNITS TBDP Take 5,000 Units by mouth daily.     . Omega-3 Fatty Acids (FISH OIL) 1200 MG CAPS Take 1,200 mg by mouth daily.    Marland Kitchen PHENobarbital (LUMINAL) 97.2 MG tablet Take 97.2 mg by mouth daily.      . polyethylene glycol (MIRALAX / GLYCOLAX) packet Take 17 g by mouth daily.    . tamsulosin (FLOMAX) 0.4 MG CAPS capsule Take 0.4 mg by mouth daily.    Marland Kitchen testosterone (ANDROGEL) 50 MG/5GM (1%) GEL APPLY 5GM(1 PACKET) TO SKIN EVERY DAY 450 g 0   No current facility-administered medications on file prior to visit.     Allergies  Allergen Reactions  . Boostrix [Tetanus-Diphth-Acell Pertussis] Other (See Comments)    Neuropathy symptoms  . Other Other (See Comments)    Flu shot-neuropathy symptoms  . Amoxicillin Rash    Upper torso only.    Past Medical History:  Diagnosis Date  . BPH (benign prostatic hypertrophy) 2014  . Coronary artery disease 01/2003   CABG  . Free monoclonal light chain    urine  . Hyperlipidemia   . Peripheral neuropathy  ([place]) 05/2011    follow up with Dr. Tish Frederickson multiple issues  . Pleural effusion 05/2011   from abdominal surgery  . Seizures ([place]) 1950's   last one 1960  . Small bowel obstruction 05/2011   due to adhesion  . Weakness 2013-May began   lower extemities    Past Surgical History:  Procedure Laterality Date  . ABDOMINAL SURGERY  05/2011   bowel obstruction  . [place]   left  . CARDIAC CATHETERIZATION  01/27/2003   NORMAL LEFT VENTRICULAR SIZE WITH MILD FOCAL LATERAL WALL HYPOKINESIA. EF 60%  . CORONARY ARTERY BYPASS GRAFT  2004   LIMA GRAFT TO THE LAD, SAPHENOUS VEIN GRAFT TO THE DIAGONAL, SAPHENOUS VEIN GRAFT TO THE FIRST OM, SAPHENOUS VEIN GRAFT TO THE PDA  . HAND SURGERY  2000   left  . TRANSURETHRAL RESECTION OF PROSTATE  1992  . TRANSURETHRAL RESECTION OF PROSTATE N/A 12/22/2012   Procedure: TRANSURETHRAL RESECTION OF THE PROSTATE WITH GYRUS (TURP);  Surgeon: Fredricka Bonine, MD;  Location[facility];  Service: Urology;  Laterality: N/A;    History  Smoking Status  . Former Smoker  . Packs/day: 1.00  . Years: 5.00  . Types: Cigarettes  . Quit date: 07/01/1965  Smokeless Tobacco  . Never Used    History  Alcohol Use No  Family History  Problem Relation Age of Onset  . Asthma Mother   . Heart disease Mother 74  . Coronary artery disease Father   . Heart disease Father   . Cancer Sister 62    breast    Review of Systems: As noted in history of present illness.  All other systems were reviewed and are negative.  Physical Exam: BP 138/82   Pulse 74   Ht $R'6\' 1"'GB$  (1.854 m)   Wt 198 lb 3.2 oz (89.9 kg)   BMI 26.15 kg/m  He is a pleasant white male in no acute distress. HEENT exam is unremarkable. No JVD or bruits. Lungs are clear. Cardiac exam reveals a regular rate and rhythm without gallop or murmur. Abdomen is soft and nontender without mass or bruits. Legs are without edema. Pedal pulses are good. He does have a foot  drop on the right. He is wearing an orthopedic shoe and brace on his right ankle. He walks with a cane. He is alert and oriented x3. Cranial nerves II through XII are intact  LABORATORY DATA: Lab Results  Component Value Date   WBC 4.2 10/04/2014   HGB 15.7 10/04/2014   HCT 47.4 10/04/2014   PLT 212.0 10/04/2014   GLUCOSE 87 08/07/2015   CHOL 144 08/07/2015   TRIG 55 08/07/2015   HDL 65 08/07/2015   LDLCALC 68 08/07/2015   ALT 31 08/07/2015   AST 24 08/07/2015   NA 139 08/07/2015   K 4.5 08/07/2015   CL 102 08/07/2015   CREATININE 0.88 08/07/2015   BUN 11 08/07/2015   CO2 28 08/07/2015   TSH 2.41 10/20/2015   PSA 0.30 10/20/2015   INR 1.05 06/07/2012   HGBA1C 6.1 09/28/2012   Labs from [place] 10/15/15: Normal CBC and CMET.   Ecg today shows NSR with an incomplete RBBB. LAD. No change.  I have personally reviewed and interpreted this study.   Assessment / Plan: 1. Coronary disease status post CABG in 2004. His last nuclear stress test in May of 2011 was normal. We will continue with his medical management and risk factor modification. He is asymptomatic. We discussed follow up stress testing but since he is asymptomatic and he is on no antianginal therapy will just monitor for now.  2. Hyperlipidemia. He remains on statin therapy. We will arrange for fasting lab work today.   3. PolyNeuropathy.  I will follow up in one year.

## 2016-09-06 ENCOUNTER — Ambulatory Visit (INDEPENDENT_AMBULATORY_CARE_PROVIDER_SITE_OTHER): Payer: Medicare HMO | Admitting: Cardiology

## 2016-09-06 ENCOUNTER — Encounter: Payer: Self-pay | Admitting: Cardiology

## 2016-09-06 VITALS — BP 138/82 | HR 74 | Ht 73.0 in | Wt 198.2 lb

## 2016-09-06 DIAGNOSIS — E7800 Pure hypercholesterolemia, unspecified: Secondary | ICD-10-CM

## 2016-09-06 DIAGNOSIS — I25810 Atherosclerosis of coronary artery bypass graft(s) without angina pectoris: Secondary | ICD-10-CM | POA: Diagnosis not present

## 2016-09-06 LAB — CBC
HCT: 48.2 % (ref 38.5–50.0)
HEMOGLOBIN: 16 g/dL (ref 13.2–17.1)
MCH: 31.9 pg (ref 27.0–33.0)
MCHC: 33.2 g/dL (ref 32.0–36.0)
MCV: 96 fL (ref 80.0–100.0)
MPV: 9.3 fL (ref 7.5–12.5)
PLATELETS: 223 10*3/uL (ref 140–400)
RBC: 5.02 MIL/uL (ref 4.20–5.80)
RDW: 14.5 % (ref 11.0–15.0)
WBC: 5.6 10*3/uL (ref 3.8–10.8)

## 2016-09-06 NOTE — Patient Instructions (Signed)
We will check lab work today  Continue your current therapy  I will see you in one year.

## 2016-09-07 ENCOUNTER — Encounter: Payer: Self-pay | Admitting: Physical Therapy

## 2016-09-07 ENCOUNTER — Ambulatory Visit: Payer: Medicare HMO | Admitting: Physical Therapy

## 2016-09-07 DIAGNOSIS — M6281 Muscle weakness (generalized): Secondary | ICD-10-CM

## 2016-09-07 DIAGNOSIS — R262 Difficulty in walking, not elsewhere classified: Secondary | ICD-10-CM

## 2016-09-07 DIAGNOSIS — M25561 Pain in right knee: Secondary | ICD-10-CM | POA: Diagnosis not present

## 2016-09-07 DIAGNOSIS — G629 Polyneuropathy, unspecified: Secondary | ICD-10-CM | POA: Diagnosis not present

## 2016-09-07 LAB — HEPATIC FUNCTION PANEL
ALBUMIN: 4.4 g/dL (ref 3.6–5.1)
ALK PHOS: 61 U/L (ref 40–115)
ALT: 25 U/L (ref 9–46)
AST: 25 U/L (ref 10–35)
Bilirubin, Direct: 0.1 mg/dL (ref <=0.2)
Indirect Bilirubin: 0.4 mg/dL (ref 0.2–1.2)
TOTAL PROTEIN: 6.5 g/dL (ref 6.1–8.1)
Total Bilirubin: 0.5 mg/dL (ref 0.2–1.2)

## 2016-09-07 LAB — LIPID PANEL
Cholesterol: 134 mg/dL (ref <200)
HDL: 56 mg/dL (ref >40)
LDL CALC: 63 mg/dL (ref <100)
Total CHOL/HDL Ratio: 2.4 Ratio (ref <5.0)
Triglycerides: 76 mg/dL (ref <150)
VLDL: 15 mg/dL (ref <30)

## 2016-09-07 LAB — BASIC METABOLIC PANEL
BUN: 13 mg/dL (ref 7–25)
CHLORIDE: 98 mmol/L (ref 98–110)
CO2: 27 mmol/L (ref 20–31)
Calcium: 9.3 mg/dL (ref 8.6–10.3)
Creat: 0.84 mg/dL (ref 0.70–1.18)
GLUCOSE: 85 mg/dL (ref 65–99)
POTASSIUM: 4.5 mmol/L (ref 3.5–5.3)
SODIUM: 135 mmol/L (ref 135–146)

## 2016-09-07 NOTE — Therapy (Signed)
[address] [place], [place], [address] Phone: [phone]   Fax:  [phone]  Physical Therapy Treatment  Patient Details  Name: Brandon Ray MRN: [id number] Date of Birth: [birth year] Referring Provider: Veverly Fells  Encounter Date: 09/07/2016      PT End of Session - 09/07/16 0834    Visit Number 14   Date for PT Re-Evaluation 09/12/16   PT Start Time 0750   PT Stop Time 0840   PT Time Calculation (min) 50 min   Activity Tolerance Patient tolerated treatment well;No increased pain   Behavior During Therapy WFL for tasks assessed/performed      Past Medical History:  Diagnosis Date  . BPH (benign prostatic hypertrophy) 2014  . Coronary artery disease 01/2003   CABG  . Free monoclonal light chain    urine  . Hyperlipidemia   . Peripheral neuropathy ([place]) 05/2011    follow up with Dr. Tish Frederickson multiple issues  . Pleural effusion 05/2011   from abdominal surgery  . Seizures ([place]) 1950's   last one 1960  . Small bowel obstruction 05/2011   due to adhesion  . Weakness 2013-May began   lower extemities    Past Surgical History:  Procedure Laterality Date  . ABDOMINAL SURGERY  05/2011   bowel obstruction  . [place]   left  . CARDIAC CATHETERIZATION  01/27/2003   NORMAL LEFT VENTRICULAR SIZE WITH MILD FOCAL LATERAL WALL HYPOKINESIA. EF 60%  . CORONARY ARTERY BYPASS GRAFT  2004   LIMA GRAFT TO THE LAD, SAPHENOUS VEIN GRAFT TO THE DIAGONAL, SAPHENOUS VEIN GRAFT TO THE FIRST OM, SAPHENOUS VEIN GRAFT TO THE PDA  . HAND SURGERY  2000   left  . TRANSURETHRAL RESECTION OF PROSTATE  1992  . TRANSURETHRAL RESECTION OF PROSTATE N/A 12/22/2012   Procedure: TRANSURETHRAL RESECTION OF THE PROSTATE WITH GYRUS (TURP);  Surgeon: Fredricka Bonine, MD;  Location: [facility];  Service: Urology;  Laterality: N/A;    There were no vitals filed for this visit.       Subjective Assessment - 09/07/16 0757    Subjective Pt reports he is doing well. His hip pain has subsisded and is only "intermittent" now.   Currently in Pain? No/denies                         [facility] Adult PT Treatment/Exercise - 09/07/16 0001      Knee/Hip Exercises: Aerobic   Nustep NuStep lvl 6, 6 minutes     Knee/Hip Exercises: Machines for Strengthening   Cybex Knee Extension 35 lb, 3x10   Cybex Knee Flexion 45 lb, 3x10   Cybex Leg Press 60 lb, 2x20     Knee/Hip Exercises: Standing   Hip Abduction Stengthening;Right;Left;1 set;20 reps   Hip Extension Stengthening;Right;Left;1 set;20 reps   Forward Step Up 1 set;Left;Right;15 reps;Step Height: 6"   Functional Squat 15 reps  wall squat, red ball, blue tband around knees     Shoulder Exercises: Standing   Row 20 reps;Strengthening;Both   Theraband Level (Shoulder Row) Level 3 (Green)     Modalities   Modalities Moist Heat     Moist Heat Therapy   Number Minutes Moist Heat 10 Minutes   Moist Heat Location Hip     Manual Therapy   Manual Therapy Taping   Kinesiotex Facilitate Muscle  used as reminder against knee valgus with functional act  PT Short Term Goals - 07/30/16 0837      PT SHORT TERM GOAL #1   Status Achieved           PT Long Term Goals - 09/07/16 0813      PT LONG TERM GOAL #4   Status On-going               Plan - 09/07/16 0834    Clinical Impression Statement Pt tolerated treatment well however he continues to struggle with balance exercises in which he needs to static stand. Any time the pt goes too far back on his heels, he loses his balance in part because of the lower leg weakness and also in part to the baces which block the anterior portion of the lower leg. Pt continues to show increased strength and has met almost all of the goals. Discuss discharge with pt during next treatment.    Rehab Potential Good   PT Frequency 2x / week    PT Duration 8 weeks   PT Treatment/Interventions ADLs/Self Care Home Management;Gait training;Stair training;Functional mobility training;Therapeutic activities;Therapeutic exercise;Neuromuscular re-education;Patient/family education;Manual techniques   PT Next Visit Plan Balance, LE strengthening, stretching, IT band stretch, Russian stim, kinesio tape on medial rt knee   PT Home Exercise Plan Supine LE stretching, standing calf stretch, seated HS stretch   Consulted and Agree with Plan of Care Patient      Patient will benefit from skilled therapeutic intervention in order to improve the following deficits and impairments:  Abnormal gait, Decreased activity tolerance, Decreased balance, Decreased mobility, Decreased endurance, Decreased range of motion, Decreased strength, Difficulty walking, Impaired flexibility, Pain  Visit Diagnosis: Acute pain of right knee  Difficulty in walking, not elsewhere classified  Muscle weakness (generalized)     Problem List Patient Active Problem List   Diagnosis Date Noted  . Osteoporosis 03/13/2014  . Encounter for long-term (current) use of steroids 03/16/2013  . Primary male hypogonadism 10/19/2012  . Endocrinopathy 09/20/2012  . Gynecomastia, male 09/20/2012  . Fracture 09/20/2012  . Free monoclonal light chain   . Monoclonal (M) protein disease, multiple 'M' protein   . CIDP (chronic inflammatory demyelinating polyneuropathy) ([place]) 06/21/2012  . Constipation 06/20/2012  . Chronic inflammatory demyelinating polyneuropathy ([place]) 06/12/2012  . Acute urinary retention 06/12/2012  . Neurogenic bladder 06/12/2012  . Right sided weakness 06/07/2012  . Seizure disorder ([place]) 06/07/2012  . Pleural effusion on right 07/22/2011  . Tachycardia 07/22/2011  . Small bowel obstruction, s/p lap LOA, c/b post op abscess 07/02/2011  . Coronary artery disease   . Hyperlipidemia     [name] Stanford, SPT 09/07/2016, 8:41 AM  [address] [place], [place], [address] Phone: [phone]   Fax:  [phone]  Name: YERICK EGGEBRECHT MRN: [id number] Date of Birth: [birth year]

## 2016-09-08 ENCOUNTER — Telehealth: Payer: Self-pay | Admitting: Cardiology

## 2016-09-08 NOTE — Telephone Encounter (Signed)
Returned call to patient lab results given. 

## 2016-09-08 NOTE — Telephone Encounter (Signed)
Patient returning call to [facility] regarding labs. Please call [email or web address] [phone]

## 2016-09-09 ENCOUNTER — Encounter: Payer: Self-pay | Admitting: Physical Therapy

## 2016-09-09 ENCOUNTER — Ambulatory Visit: Payer: Medicare HMO | Admitting: Physical Therapy

## 2016-09-09 DIAGNOSIS — M25561 Pain in right knee: Secondary | ICD-10-CM

## 2016-09-09 DIAGNOSIS — R262 Difficulty in walking, not elsewhere classified: Secondary | ICD-10-CM

## 2016-09-09 DIAGNOSIS — M6281 Muscle weakness (generalized): Secondary | ICD-10-CM

## 2016-09-09 NOTE — Therapy (Signed)
[address] [place], [place], [address] Phone: [phone]   Fax:  [phone]  Physical Therapy Treatment  Patient Details  Name: AZAHEL BELCASTRO MRN: [id number] Date of Birth: [birth year] Referring Provider: Veverly Fells  Encounter Date: 09/09/2016      PT End of Session - 09/09/16 0905    Date for PT Re-Evaluation 09/30/16      Past Medical History:  Diagnosis Date  . BPH (benign prostatic hypertrophy) 2014  . Coronary artery disease 01/2003   CABG  . Free monoclonal light chain    urine  . Hyperlipidemia   . Peripheral neuropathy ([place]) 05/2011    follow up with Dr. Tish Frederickson multiple issues  . Pleural effusion 05/2011   from abdominal surgery  . Seizures ([place]) 1950's   last one 1960  . Small bowel obstruction 05/2011   due to adhesion  . Weakness 2013-May began   lower extemities    Past Surgical History:  Procedure Laterality Date  . ABDOMINAL SURGERY  05/2011   bowel obstruction  . [place]   left  . CARDIAC CATHETERIZATION  01/27/2003   NORMAL LEFT VENTRICULAR SIZE WITH MILD FOCAL LATERAL WALL HYPOKINESIA. EF 60%  . CORONARY ARTERY BYPASS GRAFT  2004   LIMA GRAFT TO THE LAD, SAPHENOUS VEIN GRAFT TO THE DIAGONAL, SAPHENOUS VEIN GRAFT TO THE FIRST OM, SAPHENOUS VEIN GRAFT TO THE PDA  . HAND SURGERY  2000   left  . TRANSURETHRAL RESECTION OF PROSTATE  1992  . TRANSURETHRAL RESECTION OF PROSTATE N/A 12/22/2012   Procedure: TRANSURETHRAL RESECTION OF THE PROSTATE WITH GYRUS (TURP);  Surgeon: Fredricka Bonine, MD;  Location: [facility];  Service: Urology;  Laterality: N/A;    There were no vitals filed for this visit.      Subjective Assessment - 09/09/16 0758    Subjective Pt reports he is doing good and the right hip is feeling "a little" better.    Currently in Pain? No/denies                         [facility] Adult PT  Treatment/Exercise - 09/09/16 0001      Knee/Hip Exercises: Aerobic   Recumbent Bike [employer] bike 6 minutes, lvl 2     Knee/Hip Exercises: Machines for Strengthening   Cybex Knee Extension 35 lb, 3x10   Cybex Knee Flexion 45 lb, 3x10   Cybex Leg Press 60 lb, 3x10     Knee/Hip Exercises: Standing   Heel Raises Both;20 reps;2 sets   Hip Abduction Stengthening;Right;Left;1 set;20 reps   Hip Extension Stengthening;Right;Left;1 set;20 reps   Forward Step Up 1 set;Left;Right;20 reps;Step Height: 6"   Functional Squat 2 sets;10 reps  functional sit to stand     Shoulder Exercises: Standing   Other Standing Exercises elbow curls with 15 lb, 2x10     Manual Therapy   Manual Therapy Taping   Kinesiotex Facilitate Muscle     Ankle Exercises: Seated   Other Seated Ankle Exercises Ankle 4 way blue tband                PT Education - 09/09/16 0843    Education provided Yes   Education Details educated pt on ways to perform ankle 4 way at home   Person(s) Educated Patient   Methods Explanation;Demonstration   Comprehension Verbalized understanding  PT Short Term Goals - 07/30/16 0837      PT SHORT TERM GOAL #1   Status Achieved           PT Long Term Goals - 09/09/16 1025      PT LONG TERM GOAL #5   Title Pt will be able to perform sit to stand without compensatory patterns at the knees and proper mechanics.   Time 4   Period Weeks   Status New               Plan - 09/09/16 0848    Clinical Impression Statement Pt tolerated treatment well and was able to complete all exercises. Pt continues tO have trouble with sit to stand activities and static standing and requires several attempts to stand fully. The braces cause him to keep the weight on the back of the heels and transferring weight throuhg the front of the toes takes time to accomplish. The lower leg muscles and foot musculature has very little motion however he does have some muscle  contraction/motion with resisted 4 way ankle exercises with tband. Pt was encouraged to do the tband ankle 4 way exercises at home over the Christmas break. He will be gone all next week for vacation. Recertify for another 8 visits.    Rehab Potential Good   PT Frequency 2x / week   PT Duration 4 weeks   PT Treatment/Interventions ADLs/Self Care Home Management;Gait training;Stair training;Functional mobility training;Therapeutic activities;Therapeutic exercise;Neuromuscular re-education;Patient/family education;Manual techniques   PT Next Visit Plan Balance, LE strengthening, stretching, IT band stretch, kinesio tape on medial rt knee   PT Home Exercise Plan Ankle 4 way resisted tband work at home   Consulted and Agree with Plan of Care Patient      Patient will benefit from skilled therapeutic intervention in order to improve the following deficits and impairments:  Abnormal gait, Decreased activity tolerance, Decreased balance, Decreased mobility, Decreased endurance, Decreased range of motion, Decreased strength, Difficulty walking, Impaired flexibility, Pain  Visit Diagnosis: Acute pain of right knee - Plan: PT plan of care cert/re-cert  Difficulty in walking, not elsewhere classified - Plan: PT plan of care cert/re-cert  Muscle weakness (generalized) - Plan: PT plan of care cert/re-cert     Problem List Patient Active Problem List   Diagnosis Date Noted  . Osteoporosis 03/13/2014  . Encounter for long-term (current) use of steroids 03/16/2013  . Primary male hypogonadism 10/19/2012  . Endocrinopathy 09/20/2012  . Gynecomastia, male 09/20/2012  . Fracture 09/20/2012  . Free monoclonal light chain   . Monoclonal (M) protein disease, multiple 'M' protein   . CIDP (chronic inflammatory demyelinating polyneuropathy) ([place]) 06/21/2012  . Constipation 06/20/2012  . Chronic inflammatory demyelinating polyneuropathy ([place]) 06/12/2012  . Acute urinary retention 06/12/2012  .  Neurogenic bladder 06/12/2012  . Right sided weakness 06/07/2012  . Seizure disorder ([place]) 06/07/2012  . Pleural effusion on right 07/22/2011  . Tachycardia 07/22/2011  . Small bowel obstruction, s/p lap LOA, c/b post op abscess 07/02/2011  . Coronary artery disease   . Hyperlipidemia     Sumner Boast., PT 09/09/2016, 1:15 PM  [address] [place], [place], [address] Phone: [phone]   Fax:  [phone]  Name: MANI CELESTIN MRN: [id number] Date of Birth: [birth year]

## 2016-09-21 ENCOUNTER — Ambulatory Visit: Payer: Medicare HMO | Attending: Orthopedic Surgery | Admitting: Physical Therapy

## 2016-09-21 ENCOUNTER — Encounter: Payer: Self-pay | Admitting: Physical Therapy

## 2016-09-21 DIAGNOSIS — M25561 Pain in right knee: Secondary | ICD-10-CM | POA: Diagnosis not present

## 2016-09-21 DIAGNOSIS — R262 Difficulty in walking, not elsewhere classified: Secondary | ICD-10-CM | POA: Insufficient documentation

## 2016-09-21 DIAGNOSIS — M6281 Muscle weakness (generalized): Secondary | ICD-10-CM | POA: Insufficient documentation

## 2016-09-21 NOTE — Therapy (Signed)
[place], [place], [address] Phone: [phone]   Fax:  [phone]  Physical Therapy Treatment  Patient Details  Name: Brandon Ray MRN: [id number] Date of Birth: April 20, 1935 Referring Provider: Veverly Fells  Encounter Date: 09/21/2016      PT End of Session - 09/21/16 0840    Visit Number 16   Date for PT Re-Evaluation 09/30/16   PT Start Time 2563   PT Stop Time 0843   PT Time Calculation (min) 46 min   Activity Tolerance Patient tolerated treatment well;No increased pain   Behavior During Therapy [facility] for tasks assessed/performed      Past Medical History:  Diagnosis Date  . BPH (benign prostatic hypertrophy) 2014  . Coronary artery disease 01/2003   CABG  . Free monoclonal light chain    urine  . Hyperlipidemia   . Peripheral neuropathy ([place]) 05/2011    follow up with Dr. Tish Frederickson multiple issues  . Pleural effusion 05/2011   from abdominal surgery  . Seizures ([place]) 1950's   last one 1960  . Small bowel obstruction 05/2011   due to adhesion  . Weakness 2013-May began   lower extemities    Past Surgical History:  Procedure Laterality Date  . ABDOMINAL SURGERY  05/2011   bowel obstruction  . [place]   left  . CARDIAC CATHETERIZATION  01/27/2003   NORMAL LEFT VENTRICULAR SIZE WITH MILD FOCAL LATERAL WALL HYPOKINESIA. EF 60%  . CORONARY ARTERY BYPASS GRAFT  2004   LIMA GRAFT TO THE LAD, SAPHENOUS VEIN GRAFT TO THE DIAGONAL, SAPHENOUS VEIN GRAFT TO THE FIRST OM, SAPHENOUS VEIN GRAFT TO THE PDA  . HAND SURGERY  2000   left  . TRANSURETHRAL RESECTION OF PROSTATE  1992  . TRANSURETHRAL RESECTION OF PROSTATE N/A 12/22/2012   Procedure: TRANSURETHRAL RESECTION OF THE PROSTATE WITH GYRUS (TURP);  Surgeon: Fredricka Bonine, MD;  Location: [facility];  Service: Urology;  Laterality: N/A;    There were no vitals filed for this visit.       Subjective Assessment - 09/21/16 0758    Subjective "Everything going ok, No pain"   Currently in Pain? No/denies   Pain Score 0-No pain                         [facility] Adult PT Treatment/Exercise - 09/21/16 0001      High Level Balance   High Level Balance Activities Side stepping;Marching forwards  CGA     Lumbar Exercises: Aerobic   Stationary Bike [employer], 7 minutes, lvl 2     Lumbar Exercises: Seated   Sit to Stand 10 reps  from UBE seat x2, difficulty at times bringing hps forward     Knee/Hip Exercises: Machines for Strengthening   Cybex Knee Extension 35 lb, 3x10   Cybex Knee Flexion 45 lb, 3x10   Cybex Leg Press 60 lb, 3x10     Knee/Hip Exercises: Standing   Hip Abduction Stengthening;Both;10 reps;Knee straight;3 sets   Abduction Limitations 3                  PT Short Term Goals - 07/30/16 0837      PT SHORT TERM GOAL #1   Status Achieved           PT Long Term Goals - 09/09/16 1025      PT LONG TERM GOAL #5  Title Pt will be able to perform sit to stand without compensatory patterns at the knees and proper mechanics.   Time 4   Period Weeks   Status On going               Plan - 09/21/16 0841    Clinical Impression Statement Pt able to perform all exercises fair. Pt with increase fatigue on leg press. Pt with some difficulty with sit to stand getting hips forward. Low setting on leg press to incorporate more glute activation. CGA required to complete balance interventions.    Rehab Potential Good   PT Frequency 2x / week   PT Duration 4 weeks   PT Treatment/Interventions ADLs/Self Care Home Management;Gait training;Stair training;Functional mobility training;Therapeutic activities;Therapeutic exercise;Neuromuscular re-education;Patient/family education;Manual techniques   PT Next Visit Plan interventions to promote glute activation      Patient will benefit from skilled therapeutic intervention in order to  improve the following deficits and impairments:  Abnormal gait, Decreased activity tolerance, Decreased balance, Decreased mobility, Decreased endurance, Decreased range of motion, Decreased strength, Difficulty walking, Impaired flexibility, Pain  Visit Diagnosis: Acute pain of right knee  Difficulty in walking, not elsewhere classified  Muscle weakness (generalized)     Problem List Patient Active Problem List   Diagnosis Date Noted  . Osteoporosis 03/13/2014  . Encounter for long-term (current) use of steroids 03/16/2013  . Primary male hypogonadism 10/19/2012  . Endocrinopathy 09/20/2012  . Gynecomastia, male 09/20/2012  . Fracture 09/20/2012  . Free monoclonal light chain   . Monoclonal (M) protein disease, multiple 'M' protein   . CIDP (chronic inflammatory demyelinating polyneuropathy) ([place]) 06/21/2012  . Constipation 06/20/2012  . Chronic inflammatory demyelinating polyneuropathy ([place]) 06/12/2012  . Acute urinary retention 06/12/2012  . Neurogenic bladder 06/12/2012  . Right sided weakness 06/07/2012  . Seizure disorder ([place]) 06/07/2012  . Pleural effusion on right 07/22/2011  . Tachycardia 07/22/2011  . Small bowel obstruction, s/p lap LOA, c/b post op abscess 07/02/2011  . Coronary artery disease   . Hyperlipidemia     Scot Jun, PTA 09/21/2016, 8:47 AM  [place], [place], [address] Phone: [phone]   Fax:  [phone]  Name: QUINTIN HJORT MRN: [id number] Date of Birth: [birth year]

## 2016-09-23 ENCOUNTER — Ambulatory Visit: Payer: Medicare HMO | Admitting: Physical Therapy

## 2016-09-23 ENCOUNTER — Encounter: Payer: Self-pay | Admitting: Physical Therapy

## 2016-09-23 DIAGNOSIS — M25561 Pain in right knee: Secondary | ICD-10-CM

## 2016-09-23 DIAGNOSIS — M6281 Muscle weakness (generalized): Secondary | ICD-10-CM

## 2016-09-23 DIAGNOSIS — R262 Difficulty in walking, not elsewhere classified: Secondary | ICD-10-CM | POA: Diagnosis not present

## 2016-09-23 NOTE — Therapy (Signed)
[place], [place], [address] Phone: [phone]   Fax:  [phone]  Physical Therapy Treatment  Patient Details  Name: Brandon Ray MRN: [id number] Date of Birth: 1935-03-06 Referring Provider: Veverly Fells  Encounter Date: 09/23/2016      PT End of Session - 09/23/16 0847    Visit Number 17   Date for PT Re-Evaluation 09/30/16   PT Start Time 0800   PT Stop Time 0845   PT Time Calculation (min) 45 min   Behavior During Therapy [facility] for tasks assessed/performed      Past Medical History:  Diagnosis Date  . BPH (benign prostatic hypertrophy) 2014  . Coronary artery disease 01/2003   CABG  . Free monoclonal light chain    urine  . Hyperlipidemia   . Peripheral neuropathy ([place]) 05/2011    follow up with Dr. Tish Frederickson multiple issues  . Pleural effusion 05/2011   from abdominal surgery  . Seizures ([place]) 1950's   last one 1960  . Small bowel obstruction 05/2011   due to adhesion  . Weakness 2013-May began   lower extemities    Past Surgical History:  Procedure Laterality Date  . ABDOMINAL SURGERY  05/2011   bowel obstruction  . [place]   left  . CARDIAC CATHETERIZATION  01/27/2003   NORMAL LEFT VENTRICULAR SIZE WITH MILD FOCAL LATERAL WALL HYPOKINESIA. EF 60%  . CORONARY ARTERY BYPASS GRAFT  2004   LIMA GRAFT TO THE LAD, SAPHENOUS VEIN GRAFT TO THE DIAGONAL, SAPHENOUS VEIN GRAFT TO THE FIRST OM, SAPHENOUS VEIN GRAFT TO THE PDA  . HAND SURGERY  2000   left  . TRANSURETHRAL RESECTION OF PROSTATE  1992  . TRANSURETHRAL RESECTION OF PROSTATE N/A 12/22/2012   Procedure: TRANSURETHRAL RESECTION OF THE PROSTATE WITH GYRUS (TURP);  Surgeon: Fredricka Bonine, MD;  Location: [facility];  Service: Urology;  Laterality: N/A;    There were no vitals filed for this visit.      Subjective Assessment - 09/23/16 0800    Subjective "Im doing good, was a  little sore but that was good"   Currently in Pain? No/denies   Pain Score 0-No pain                         [facility] Adult PT Treatment/Exercise - 09/23/16 0001      Lumbar Exercises: Seated   Sit to Stand 10 reps  x2, light assist needed at times fornt of shoulder & LB   Sit to Stand Limitations Seated Rows 35lb 3x10     Lumbar Exercises: Supine   Bridge 2 seconds;15 reps  x3     Knee/Hip Exercises: Aerobic   Nustep NuStep lvl 7, 7 minutes     Knee/Hip Exercises: Machines for Strengthening   Cybex Knee Extension 35 lb, 3x10   Cybex Knee Flexion 45 lb, 3x10   Cybex Leg Press 60 lb, 3x10; machine set low to incorporate more glut isolation     Knee/Hip Exercises: Prone   Hip Extension 2 sets;Both;10 reps                  PT Short Term Goals - 07/30/16 0837      PT SHORT TERM GOAL #1   Status Achieved           PT Long Term Goals - 09/23/16 0109  PT LONG TERM GOAL #1   Title report pain in the knee decreased 25%   Status Achieved     PT LONG TERM GOAL #2   Title increase hip strength to 4/5 for flexion   Status Achieved     PT LONG TERM GOAL #3   Title increase right knee extension to 5 degrees full extension   Status On-going     PT LONG TERM GOAL #4   Title increase right SLR to 65 degrees   Status On-going     PT LONG TERM GOAL #5   Status Partially Met               Plan - 09/23/16 0847    Clinical Impression Statement Pt with good effort with today's exercises. Does requires some assist on at the anterior shoulder and low back to complete some of the sit to stand repletion. Pt does fatigue quick with the leg press intervention at low setting. Pt also demos some weakness with prone hip ext.   PT Frequency 2x / week   PT Duration 4 weeks   PT Treatment/Interventions ADLs/Self Care Home Management;Gait training;Stair training;Functional mobility training;Therapeutic activities;Therapeutic exercise;Neuromuscular  re-education;Patient/family education;Manual techniques   PT Next Visit Plan interventions to promote glut activation      Patient will benefit from skilled therapeutic intervention in order to improve the following deficits and impairments:     Visit Diagnosis: Acute pain of right knee  Difficulty in walking, not elsewhere classified  Muscle weakness (generalized)     Problem List Patient Active Problem List   Diagnosis Date Noted  . Osteoporosis 03/13/2014  . Encounter for long-term (current) use of steroids 03/16/2013  . Primary male hypogonadism 10/19/2012  . Endocrinopathy 09/20/2012  . Gynecomastia, male 09/20/2012  . Fracture 09/20/2012  . Free monoclonal light chain   . Monoclonal (M) protein disease, multiple 'M' protein   . CIDP (chronic inflammatory demyelinating polyneuropathy) ([place]) 06/21/2012  . Constipation 06/20/2012  . Chronic inflammatory demyelinating polyneuropathy ([place]) 06/12/2012  . Acute urinary retention 06/12/2012  . Neurogenic bladder 06/12/2012  . Right sided weakness 06/07/2012  . Seizure disorder ([place]) 06/07/2012  . Pleural effusion on right 07/22/2011  . Tachycardia 07/22/2011  . Small bowel obstruction, s/p lap LOA, c/b post op abscess 07/02/2011  . Coronary artery disease   . Hyperlipidemia     Scot Jun, PTA 09/23/2016, 8:51 AM  [place], [place], [address] Phone: [phone]   Fax:  [phone]  Name: ENMANUEL ZUFALL MRN: [id number] Date of Birth: 04-27-1935

## 2016-09-28 ENCOUNTER — Ambulatory Visit: Payer: Medicare HMO | Admitting: Physical Therapy

## 2016-10-01 ENCOUNTER — Ambulatory Visit: Payer: Medicare HMO | Admitting: Physical Therapy

## 2016-10-01 ENCOUNTER — Encounter: Payer: Self-pay | Admitting: Physical Therapy

## 2016-10-01 DIAGNOSIS — R262 Difficulty in walking, not elsewhere classified: Secondary | ICD-10-CM | POA: Diagnosis not present

## 2016-10-01 DIAGNOSIS — M6281 Muscle weakness (generalized): Secondary | ICD-10-CM

## 2016-10-01 DIAGNOSIS — M25561 Pain in right knee: Secondary | ICD-10-CM

## 2016-10-01 NOTE — Therapy (Signed)
[place], [place], [address] Phone: [phone]   Fax:  [phone]  Physical Therapy Treatment  Patient Details  Name: Brandon Ray MRN: [id number] Date of Birth: January 13, 1935 Referring Provider: Veverly Fells  Encounter Date: 10/01/2016      PT End of Session - 10/01/16 0922    Visit Number 18   Date for PT Re-Evaluation 09/30/16   PT Start Time 0845   PT Stop Time 0925   PT Time Calculation (min) 40 min   Equipment Utilized During Treatment Gait belt   Activity Tolerance Patient tolerated treatment well   Behavior During Therapy [facility] for tasks assessed/performed      Past Medical History:  Diagnosis Date  . BPH (benign prostatic hypertrophy) 2014  . Coronary artery disease 01/2003   CABG  . Free monoclonal light chain    urine  . Hyperlipidemia   . Peripheral neuropathy ([place]) 05/2011    follow up with Dr. Tish Frederickson multiple issues  . Pleural effusion 05/2011   from abdominal surgery  . Seizures ([place]) 1950's   last one 1960  . Small bowel obstruction 05/2011   due to adhesion  . Weakness 2013-May began   lower extemities    Past Surgical History:  Procedure Laterality Date  . ABDOMINAL SURGERY  05/2011   bowel obstruction  . [place]   left  . CARDIAC CATHETERIZATION  01/27/2003   NORMAL LEFT VENTRICULAR SIZE WITH MILD FOCAL LATERAL WALL HYPOKINESIA. EF 60%  . CORONARY ARTERY BYPASS GRAFT  2004   LIMA GRAFT TO THE LAD, SAPHENOUS VEIN GRAFT TO THE DIAGONAL, SAPHENOUS VEIN GRAFT TO THE FIRST OM, SAPHENOUS VEIN GRAFT TO THE PDA  . HAND SURGERY  2000   left  . TRANSURETHRAL RESECTION OF PROSTATE  1992  . TRANSURETHRAL RESECTION OF PROSTATE N/A 12/22/2012   Procedure: TRANSURETHRAL RESECTION OF THE PROSTATE WITH GYRUS (TURP);  Surgeon: Fredricka Bonine, MD;  Location: [facility];  Service: Urology;  Laterality: N/A;    There were no vitals filed  for this visit.      Subjective Assessment - 10/01/16 0844    Subjective a little pain in hip, but feels okay.   Currently in Pain? Yes   Pain Score 4    Pain Location Hip   Pain Orientation Right   Pain Frequency Intermittent            [facility] PT Assessment - 10/01/16 0001      AROM   Right Knee Extension 5                     [facility] Adult PT Treatment/Exercise - 10/01/16 0001      Lumbar Exercises: Seated   Sit to Stand 15 reps  end range eccentric with box and airex      Lumbar Exercises: Supine   Bridge 15 reps;2 seconds     Knee/Hip Exercises: Aerobic   Nustep lvl 5 7 mins      Knee/Hip Exercises: Machines for Strengthening   Cybex Knee Extension 35# 3x10   Cybex Knee Flexion 45# 3x10   Cybex Leg Press 60# 3x10     Knee/Hip Exercises: Supine   Bridges with Ball Squeeze 1 set;15 reps   Bridges with Clamshell 1 set;15 reps                  PT Short Term Goals - 07/30/16  0837      PT SHORT TERM GOAL #1   Status Achieved           PT Long Term Goals - 10/01/16 0930      PT LONG TERM GOAL #3   Title increase right knee extension to 5 degrees full extension   Status Achieved     PT LONG TERM GOAL #4   Title increase right SLR to 65 degrees   Status Partially Met     PT LONG TERM GOAL #5   Title Pt will be able to perform sit to stand without compensatory patterns at the knees and proper mechanics.   Status Partially Met               Plan - 10/01/16 0924    Clinical Impression Statement Patient focused and gave good effort today when doing each exercise. He felt disappointed he couldn't complete the eccentric squats but had no pain throughout treatment but during knee extensions his quads were burning. He said he liked the variety of bridging. Pt expressed no functional limitations at home but had stiffness in his right knee in the morning.   Rehab Potential Good   PT Frequency 2x / week   PT Duration 4 weeks   PT  Treatment/Interventions ADLs/Self Care Home Management;Gait training;Stair training;Functional mobility training;Therapeutic activities;Therapeutic exercise;Neuromuscular re-education;Patient/family education;Manual techniques   PT Next Visit Plan D/C patient next treatment      Patient will benefit from skilled therapeutic intervention in order to improve the following deficits and impairments:  Abnormal gait, Decreased activity tolerance, Decreased balance, Decreased mobility, Decreased endurance, Decreased range of motion, Decreased strength, Difficulty walking, Impaired flexibility, Pain  Visit Diagnosis: Acute pain of right knee  Difficulty in walking, not elsewhere classified  Muscle weakness (generalized)     Problem List Patient Active Problem List   Diagnosis Date Noted  . Osteoporosis 03/13/2014  . Encounter for long-term (current) use of steroids 03/16/2013  . Primary male hypogonadism 10/19/2012  . Endocrinopathy 09/20/2012  . Gynecomastia, male 09/20/2012  . Fracture 09/20/2012  . Free monoclonal light chain   . Monoclonal (M) protein disease, multiple 'M' protein   . CIDP (chronic inflammatory demyelinating polyneuropathy) ([place]) 06/21/2012  . Constipation 06/20/2012  . Chronic inflammatory demyelinating polyneuropathy ([place]) 06/12/2012  . Acute urinary retention 06/12/2012  . Neurogenic bladder 06/12/2012  . Right sided weakness 06/07/2012  . Seizure disorder ([place]) 06/07/2012  . Pleural effusion on right 07/22/2011  . Tachycardia 07/22/2011  . Small bowel obstruction, s/p lap LOA, c/b post op abscess 07/02/2011  . Coronary artery disease   . Hyperlipidemia     Brandon Ray, [place] 10/01/2016, 9:34 AM  [place], [place], [address] Phone: [phone]   Fax:  [phone]  Name: WELDON NOURI MRN: [id number] Date of Birth: 1935-06-29

## 2016-10-06 ENCOUNTER — Inpatient Hospital Stay (HOSPITAL_COMMUNITY)
Admission: EM | Admit: 2016-10-06 | Discharge: 2016-10-09 | DRG: 389 | Disposition: A | Discharge status: Home / Self Care | Payer: Medicare HMO | Attending: Internal Medicine | Admitting: Internal Medicine

## 2016-10-06 ENCOUNTER — Encounter (HOSPITAL_COMMUNITY): Payer: Self-pay | Admitting: *Deleted

## 2016-10-06 ENCOUNTER — Emergency Department (HOSPITAL_COMMUNITY): Payer: Medicare HMO

## 2016-10-06 ENCOUNTER — Encounter: Payer: Medicare HMO | Admitting: Physical Therapy

## 2016-10-06 DIAGNOSIS — Z888 Allergy status to other drugs, medicaments and biological substances status: Secondary | ICD-10-CM | POA: Diagnosis not present

## 2016-10-06 DIAGNOSIS — E784 Other hyperlipidemia: Secondary | ICD-10-CM | POA: Diagnosis not present

## 2016-10-06 DIAGNOSIS — N179 Acute kidney failure, unspecified: Secondary | ICD-10-CM | POA: Diagnosis not present

## 2016-10-06 DIAGNOSIS — Z79899 Other long term (current) drug therapy: Secondary | ICD-10-CM | POA: Diagnosis not present

## 2016-10-06 DIAGNOSIS — E46 Unspecified protein-calorie malnutrition: Secondary | ICD-10-CM | POA: Diagnosis present

## 2016-10-06 DIAGNOSIS — E785 Hyperlipidemia, unspecified: Secondary | ICD-10-CM | POA: Diagnosis present

## 2016-10-06 DIAGNOSIS — E860 Dehydration: Secondary | ICD-10-CM | POA: Diagnosis present

## 2016-10-06 DIAGNOSIS — E876 Hypokalemia: Secondary | ICD-10-CM | POA: Diagnosis not present

## 2016-10-06 DIAGNOSIS — N400 Enlarged prostate without lower urinary tract symptoms: Secondary | ICD-10-CM | POA: Diagnosis present

## 2016-10-06 DIAGNOSIS — E291 Testicular hypofunction: Secondary | ICD-10-CM | POA: Diagnosis present

## 2016-10-06 DIAGNOSIS — Z951 Presence of aortocoronary bypass graft: Secondary | ICD-10-CM

## 2016-10-06 DIAGNOSIS — I2510 Atherosclerotic heart disease of native coronary artery without angina pectoris: Secondary | ICD-10-CM | POA: Diagnosis not present

## 2016-10-06 DIAGNOSIS — Z7982 Long term (current) use of aspirin: Secondary | ICD-10-CM

## 2016-10-06 DIAGNOSIS — G40909 Epilepsy, unspecified, not intractable, without status epilepticus: Secondary | ICD-10-CM | POA: Diagnosis not present

## 2016-10-06 DIAGNOSIS — R1110 Vomiting, unspecified: Secondary | ICD-10-CM | POA: Diagnosis not present

## 2016-10-06 DIAGNOSIS — K56609 Unspecified intestinal obstruction, unspecified as to partial versus complete obstruction: Principal | ICD-10-CM | POA: Diagnosis present

## 2016-10-06 DIAGNOSIS — Z87891 Personal history of nicotine dependence: Secondary | ICD-10-CM

## 2016-10-06 DIAGNOSIS — G6181 Chronic inflammatory demyelinating polyneuritis: Secondary | ICD-10-CM | POA: Diagnosis present

## 2016-10-06 DIAGNOSIS — K56600 Partial intestinal obstruction, unspecified as to cause: Secondary | ICD-10-CM | POA: Diagnosis not present

## 2016-10-06 DIAGNOSIS — R197 Diarrhea, unspecified: Secondary | ICD-10-CM

## 2016-10-06 DIAGNOSIS — Z887 Allergy status to serum and vaccine status: Secondary | ICD-10-CM | POA: Diagnosis not present

## 2016-10-06 LAB — COMPREHENSIVE METABOLIC PANEL
ALT: 27 U/L (ref 17–63)
ANION GAP: 14 (ref 5–15)
AST: 27 U/L (ref 15–41)
Albumin: 4 g/dL (ref 3.5–5.0)
Alkaline Phosphatase: 76 U/L (ref 38–126)
BUN: 19 mg/dL (ref 6–20)
CHLORIDE: 100 mmol/L — AB (ref 101–111)
CO2: 23 mmol/L (ref 22–32)
Calcium: 9 mg/dL (ref 8.9–10.3)
Creatinine, Ser: 1.19 mg/dL (ref 0.61–1.24)
GFR, EST NON AFRICAN AMERICAN: 56 mL/min — AB (ref >60)
Glucose, Bld: 88 mg/dL (ref 65–99)
POTASSIUM: 4.3 mmol/L (ref 3.5–5.1)
Sodium: 137 mmol/L (ref 135–145)
Total Bilirubin: 1.6 mg/dL — ABNORMAL HIGH (ref 0.3–1.2)
Total Protein: 6.8 g/dL (ref 6.5–8.1)

## 2016-10-06 LAB — CBC WITH DIFFERENTIAL/PLATELET
BASOS ABS: 0 10*3/uL (ref 0.0–0.1)
Basophils Relative: 0 %
EOS ABS: 0.1 10*3/uL (ref 0.0–0.7)
Eosinophils Relative: 1 %
HCT: 50.7 % (ref 39.0–52.0)
HEMOGLOBIN: 17.3 g/dL — AB (ref 13.0–17.0)
LYMPHS ABS: 1.3 10*3/uL (ref 0.7–4.0)
LYMPHS PCT: 15 %
MCH: 32.4 pg (ref 26.0–34.0)
MCHC: 34.1 g/dL (ref 30.0–36.0)
MCV: 94.9 fL (ref 78.0–100.0)
Monocytes Absolute: 0.5 10*3/uL (ref 0.1–1.0)
Monocytes Relative: 5 %
NEUTROS PCT: 79 %
Neutro Abs: 7.3 10*3/uL (ref 1.7–7.7)
Platelets: 224 10*3/uL (ref 150–400)
RBC: 5.34 MIL/uL (ref 4.22–5.81)
RDW: 14.6 % (ref 11.5–15.5)
WBC: 9.2 10*3/uL (ref 4.0–10.5)

## 2016-10-06 LAB — I-STAT CG4 LACTIC ACID, ED: LACTIC ACID, VENOUS: 1.23 mmol/L (ref 0.5–1.9)

## 2016-10-06 MED ORDER — TESTOSTERONE 50 MG/5GM (1%) TD GEL: 5.0000 g | Freq: Every day | TRANSDERMAL | Status: DC

## 2016-10-06 MED ORDER — KETOROLAC TROMETHAMINE 15 MG/ML IJ SOLN: 15.0000 mg | Freq: Three times a day (TID) | INTRAMUSCULAR | Status: DC | PRN

## 2016-10-06 MED ORDER — DEXTROSE-NACL 5-0.45 % IV SOLN
INTRAVENOUS | Status: DC
  Administered 2016-10-06 – 2016-10-08 (×4): via INTRAVENOUS

## 2016-10-06 MED ORDER — PREGABALIN 75 MG PO CAPS: 75.0000 mg | ORAL_CAPSULE | Freq: Three times a day (TID) | ORAL | Status: DC

## 2016-10-06 MED ORDER — ONDANSETRON HCL 4 MG PO TABS: 4.0000 mg | ORAL_TABLET | Freq: Four times a day (QID) | ORAL | Status: DC | PRN

## 2016-10-06 MED ORDER — TAMSULOSIN HCL 0.4 MG PO CAPS
0.4000 mg | ORAL_CAPSULE | Freq: Every day | ORAL | Status: DC
  Administered 2016-10-06 – 2016-10-08 (×3): 0.4 mg via ORAL
  Filled 2016-10-06 (×3): qty 1

## 2016-10-06 MED ORDER — ASPIRIN EC 81 MG PO TBEC
81.0000 mg | DELAYED_RELEASE_TABLET | Freq: Every day | ORAL | Status: DC
  Administered 2016-10-06 – 2016-10-08 (×3): 81 mg via ORAL
  Filled 2016-10-06 (×3): qty 1

## 2016-10-06 MED ORDER — PREGABALIN 75 MG PO CAPS
75.0000 mg | ORAL_CAPSULE | Freq: Three times a day (TID) | ORAL | Status: DC
  Administered 2016-10-06 – 2016-10-09 (×10): 75 mg via ORAL
  Filled 2016-10-06 (×10): qty 1

## 2016-10-06 MED ORDER — ONDANSETRON HCL 4 MG/2ML IJ SOLN: 4.0000 mg | Freq: Four times a day (QID) | INTRAMUSCULAR | Status: DC | PRN

## 2016-10-06 MED ORDER — MORPHINE SULFATE (PF) 4 MG/ML IV SOLN: 2.0000 mg | INTRAVENOUS | Status: DC | PRN

## 2016-10-06 MED ORDER — IOPAMIDOL (ISOVUE-300) INJECTION 61%
INTRAVENOUS | Status: AC
  Administered 2016-10-06: 100 mL
  Filled 2016-10-06: qty 100

## 2016-10-06 MED ORDER — PHENOBARBITAL 32.4 MG PO TABS
97.2000 mg | ORAL_TABLET | Freq: Every day | ORAL | Status: DC
  Administered 2016-10-06 – 2016-10-08 (×3): 97.2 mg via ORAL
  Filled 2016-10-06 (×3): qty 3

## 2016-10-06 MED ORDER — ONDANSETRON HCL 4 MG/2ML IJ SOLN
4.0000 mg | Freq: Once | INTRAMUSCULAR | Status: AC
  Administered 2016-10-06: 4 mg via INTRAVENOUS
  Filled 2016-10-06: qty 2

## 2016-10-06 MED ORDER — ATORVASTATIN CALCIUM 20 MG PO TABS
20.0000 mg | ORAL_TABLET | Freq: Every day | ORAL | Status: DC
  Administered 2016-10-06 – 2016-10-08 (×3): 20 mg via ORAL
  Filled 2016-10-06 (×3): qty 1

## 2016-10-06 MED ORDER — ENOXAPARIN SODIUM 40 MG/0.4ML ~~LOC~~ SOLN
40.0000 mg | SUBCUTANEOUS | Status: DC
  Administered 2016-10-06 – 2016-10-08 (×3): 40 mg via SUBCUTANEOUS
  Filled 2016-10-06 (×3): qty 0.4

## 2016-10-06 MED ORDER — SODIUM CHLORIDE 0.9 % IV BOLUS (SEPSIS)
1000.0000 mL | Freq: Once | INTRAVENOUS | Status: AC
  Administered 2016-10-06: 1000 mL via INTRAVENOUS

## 2016-10-06 MED ORDER — GABAPENTIN 600 MG PO TABS: 300.0000 mg | ORAL_TABLET | Freq: Three times a day (TID) | ORAL | Status: DC

## 2016-10-06 NOTE — ED Triage Notes (Signed)
PT feels he has a bowel obstruction.  States hx of obstruction in the past.  Has not eaten in 3 days, emesis stopped yesterday and has been constipated.

## 2016-10-06 NOTE — ED Notes (Signed)
Pt states he is unable to have a BM, but states he had a soft BM last night. Pt states he also hasn't eaten in 3 days because he had an episode of vomiting and feels like he wasn't able to get anything past his blockage. Pt states he has a hx of bowel blockage.

## 2016-10-06 NOTE — H&P (Signed)
History and Physical    Brandon Ray:[id number] DOB: 1935/05/13 DOA: 10/06/2016  PCP: [name], MD Patient coming from: home  Chief Complaint: Change in stool and abd pain  HPI: Brandon Ray is a 82 y.o. male with medical history significant of *BPH, CAD status post CABG, HLD, severe peripheral neuropathy, seizures, SBO status post exploratory laparotomy with adhesions presenting with approximately 4 week history of loose light-colored stools and 3 day history of abdominal pain. Abd pain intermittent initially been constant. Associated with nausea, vomiting. Worse with food. Relieved with bowel rest. Predominately in the left lower quadrant and suprapubic regions. Nonradiating. Stool softeners without relief. Little to no oral intake over the last 3 days. Denies chest pain, palpitations, shortness of breath, hematochezia, melena, hematemesis, dysuria, frequency, fevers, chills.  ED Course: Objective findings outlined below. General surgery consult for SBO.  Review of Systems: As per HPI otherwise 10 point review of systems negative.   Ambulatory Status:no restrictions  Past Medical History:  Diagnosis Date  . BPH (benign prostatic hypertrophy) 2014  . Coronary artery disease 01/2003   CABG  . Free monoclonal light chain    urine  . Hyperlipidemia   . Peripheral neuropathy ([place]) 05/2011    follow up with Dr. Tish Frederickson multiple issues  . Pleural effusion 05/2011   from abdominal surgery  . Seizures ([place]) 1950's   last one 1960  . Small bowel obstruction 05/2011   due to adhesion  . Weakness 2013-May began   lower extemities    Past Surgical History:  Procedure Laterality Date  . ABDOMINAL SURGERY  05/2011   bowel obstruction  . [place]   left  . CARDIAC CATHETERIZATION  01/27/2003   NORMAL LEFT VENTRICULAR SIZE WITH MILD FOCAL LATERAL WALL HYPOKINESIA. EF 60%  . CORONARY ARTERY BYPASS GRAFT  2004   LIMA GRAFT TO THE LAD, SAPHENOUS VEIN  GRAFT TO THE DIAGONAL, SAPHENOUS VEIN GRAFT TO THE FIRST OM, SAPHENOUS VEIN GRAFT TO THE PDA  . HAND SURGERY  2000   left  . TRANSURETHRAL RESECTION OF PROSTATE  1992  . TRANSURETHRAL RESECTION OF PROSTATE N/A 12/22/2012   Procedure: TRANSURETHRAL RESECTION OF THE PROSTATE WITH GYRUS (TURP);  Surgeon: Fredricka Bonine, MD;  Location: [facility];  Service: Urology;  Laterality: N/A;    Social History   Social History  . Marital status: Married    Spouse name: N/A  . Number of children: 4  . Years of education: N/A   Occupational History  . manager     retired, Personnel officer.    Social History Main Topics  . Smoking status: Former Smoker    Packs/day: 1.00    Years: 5.00    Types: Cigarettes    Quit date: 07/01/1965  . Smokeless tobacco: Never Used  . Alcohol use No  . Drug use: No  . Sexual activity: Not on file   Other Topics Concern  . Not on file   Social History Narrative  . No narrative on file    Allergies  Allergen Reactions  . Boostrix [Tetanus-Diphth-Acell Pertussis] Other (See Comments)    Neuropathy symptoms  . Other Other (See Comments)    Flu shot-neuropathy symptoms  . Amoxicillin Rash    Upper torso only.    Family History  Problem Relation Age of Onset  . Asthma Mother   . Heart disease Mother 63  . Coronary artery disease Father   . Heart disease Father   .  Cancer Sister 56    breast    Prior to Admission medications   Medication Sig Start Date End Date Taking? Authorizing Provider  aspirin 81 MG tablet Take 81 mg by mouth daily.   Yes Historical Provider, MD  atorvastatin (LIPITOR) 20 MG tablet TAKE 1 TABLET(20 MG) BY MOUTH DAILY 07/26/16  Yes Peter M Martin[place], MD  calcium-vitamin D (OSCAL WITH D) 500-200 MG-UNIT per tablet Take 1 tablet by mouth daily.     Yes Historical Provider, MD  Cholecalciferol 2000 UNITS TBDP Take 5,000 Units by mouth daily.    Yes Historical Provider, MD  LYRICA 75 MG capsule Take 75  mg by mouth 3 (three) times daily.  08/24/16  Yes Historical Provider, MD  Multiple Vitamins-Minerals (PRESERVISION AREDS 2 PO) Take 1 tablet by mouth daily.   Yes Historical Provider, MD  Omega-3 Fatty Acids (FISH OIL) 1200 MG CAPS Take 1,200 mg by mouth daily.   Yes Historical Provider, MD  PHENobarbital (LUMINAL) 97.2 MG tablet Take 97.2 mg by mouth daily.     Yes Historical Provider, MD  polyethylene glycol (MIRALAX / GLYCOLAX) packet Take 17 g by mouth daily.   Yes Historical Provider, MD  tamsulosin (FLOMAX) 0.4 MG CAPS capsule Take 0.4 mg by mouth daily. 09/21/14  Yes Historical Provider, MD  testosterone (ANDROGEL) 50 MG/5GM (1%) GEL APPLY 5GM(1 PACKET) TO SKIN EVERY DAY 08/24/16  Yes Philemon Kingdom, MD  gabapentin (NEURONTIN) 300 MG capsule Take 600 mg by mouth 3 (three) times daily. 07/16/16   Historical Provider, MD    Physical Exam: Vitals:   10/06/16 1530 10/06/16 1600 10/06/16 1615 10/06/16 1645  BP: 130/60 129/57 108/86 126/63  Pulse: 80 79 79 80  Resp:      Temp:      TempSrc:      SpO2: 96% 96% 96% 94%  Weight:      Height:         General:  Appears calm and comfortable Eyes:  PERRL, EOMI, normal lids, iris ENT:  grossly normal hearing, lips & tongue, mmm Neck:  no LAD, masses or thyromegaly Cardiovascular:  RRR, no m/r/g. No LE edema.  Respiratory:  CTA bilaterally, no w/r/r. Normal respiratory effort. Abdomen: Soft, hypoactive bowel sounds, mild distention Skin:  no rash or induration seen on limited exam Musculoskeletal: Bilateral foot drop with foot supports in place, no bony abnormalities Psychiatric:  grossly normal mood and affect, speech fluent and appropriate, AOx3 Neurologic:  CN 2-12 grossly intact, moves all extremities in coordinated fashion, sensation intact  Labs on Admission: I have personally reviewed following labs and imaging studies  CBC:  Recent Labs Lab 10/06/16 0928  WBC 9.2  NEUTROABS 7.3  HGB 17.3*  HCT 50.7  MCV 94.9  PLT 811    Basic Metabolic Panel:  Recent Labs Lab 10/06/16 0928  NA 137  K 4.3  CL 100*  CO2 23  GLUCOSE 88  BUN 19  CREATININE 1.19  CALCIUM 9.0   GFR: Estimated Creatinine Clearance: 56.9 mL/min (by C-G formula based on SCr of 1.19 mg/dL). Liver Function Tests:  Recent Labs Lab 10/06/16 0928  AST 27  ALT 27  ALKPHOS 76  BILITOT 1.6*  PROT 6.8  ALBUMIN 4.0   No results for input(s): LIPASE, AMYLASE in the last 168 hours. No results for input(s): AMMONIA in the last 168 hours. Coagulation Profile: No results for input(s): INR, PROTIME in the last 168 hours. Cardiac Enzymes: No results for input(s): CKTOTAL, CKMB, CKMBINDEX, TROPONINI  in the last 168 hours. BNP (last 3 results) No results for input(s): PROBNP in the last 8760 hours. HbA1C: No results for input(s): HGBA1C in the last 72 hours. CBG: No results for input(s): GLUCAP in the last 168 hours. Lipid Profile: No results for input(s): CHOL, HDL, LDLCALC, TRIG, CHOLHDL, LDLDIRECT in the last 72 hours. Thyroid Function Tests: No results for input(s): TSH, T4TOTAL, FREET4, T3FREE, THYROIDAB in the last 72 hours. Anemia Panel: No results for input(s): VITAMINB12, FOLATE, FERRITIN, TIBC, IRON, RETICCTPCT in the last 72 hours. Urine analysis:    Component Value Date/Time   COLORURINE YELLOW 06/07/2012 1226   APPEARANCEUR CLEAR 06/07/2012 1226   LABSPEC 1.013 06/07/2012 1226   PHURINE 7.5 06/07/2012 1226   GLUCOSEU NEGATIVE 06/07/2012 1226   HGBUR NEGATIVE 06/07/2012 1226   BILIRUBINUR NEGATIVE 06/07/2012 1226   KETONESUR NEGATIVE 06/07/2012 1226   PROTEINUR NEGATIVE 06/07/2012 1226   UROBILINOGEN 0.2 06/07/2012 1226   NITRITE NEGATIVE 06/07/2012 1226   LEUKOCYTESUR TRACE (A) 06/07/2012 1226    Creatinine Clearance: Estimated Creatinine Clearance: 56.9 mL/min (by C-G formula based on SCr of 1.19 mg/dL).  Sepsis Labs: $RemoveBefo'@[id number]'[id number]$ (procalcitonin:4,lacticidven:4) )No results found for this or any previous visit  (from the past 240 hour(s)).   Radiological Exams on Admission: Ct Abdomen Pelvis W Contrast  Result Date: 10/06/2016 CLINICAL DATA:  Vomiting with decreased appetite and constipation. History of bowel obstruction. EXAM: CT ABDOMEN AND PELVIS WITH CONTRAST TECHNIQUE: Multidetector CT imaging of the abdomen and pelvis was performed using the standard protocol following bolus administration of intravenous contrast. CONTRAST:  158mL ISOVUE-300 IOPAMIDOL (ISOVUE-300) INJECTION 61% COMPARISON:  CT scan dated 07/08/2011 FINDINGS: Lower chest: Prior CABG. Small hiatal hernia with some edema of the mucosa of the distal esophagus suggesting reflux esophagitis. Heart size is normal. Small focal area of scarring or atelectasis at the right lung base posterior medially. Hepatobiliary: No focal liver abnormality is seen. No gallstones, gallbladder wall thickening, or biliary dilatation. Pancreas: Unremarkable. No pancreatic ductal dilatation or surrounding inflammatory changes. Spleen: Normal in size without focal abnormality. Adrenals/Urinary Tract: Adrenal glands are unremarkable. Kidneys are normal, without renal calculi, focal lesion, or hydronephrosis. Bladder is unremarkable. Stomach/Bowel: There is small-bowel obstruction in the mid small bowel, best seen on image 55 of series 5. There is no mass at that site. Distal small bowel is decompressed. Colon is normal. Appendix is normal. Vascular/Lymphatic: Aortic atherosclerosis.  No adenopathy. Reproductive: Prostate gland is normal. The right testicle is slightly high in the right inguinal canal. Other: Small amount of free fluid in the pelvis.  No free air. Musculoskeletal: Slight arthritis of both hips. No acute abnormalities. IMPRESSION: Mid mid small bowel obstruction. Small hiatal hernia with edema of the mucosa of the distal esophagus suggesting esophagitis. Electronically Signed   By: Lorriane Shire M.D.   On: 10/06/2016 12:12    EKG:  pending  Assessment/Plan Active Problems:   Coronary artery disease   Hyperlipidemia   Seizure disorder (HCC)   CIDP (chronic inflammatory demyelinating polyneuropathy) ([place])   Primary male hypogonadism   SBO (small bowel obstruction)   Diarrhea   AKI (acute kidney injury) ([place])   SBO: h/o of the same in the past complicated by adhesions requiring exploratory laparotomy. Current episode likely due to adhesions versus viral etiology. No emesis in several days. Tolerating home medications. No other oral intake over the last 3 days. Lactic acid and normal WBC. No evidence of acute abdomen. - No NG tube at this time - Bowel rest except  sips with meds - Consider SBO series if condition worsens or does not improve - Follow general surgery recommendations  Diarrhea: Ongoing for 3-4 weeks. Light colored per patient. No well-formed stool during this time. Chemistries unremarkable. Likely related to above. CT scan unremarkable except for SBO and mild esophageal irritation - Lipase  - Stool lactoferrin, and pathogen panel - if persists consider GI consult - probiotic when taking PO  Malnutrition: little to no caloric intake for 3 days prior to admission. Dehydrated based on labs. - Prealbumin - D5 1/2NS maintenance w/ NS bolus - resume w/ clears and ADAT when able.   AKI: Mild. Cr 1.19. Baseline 0.8. Likely from little fluid intake - IVF, BMP in am  BPH: - continue flomax  Szr: no recent seizure - contnue Phenobarb  Chronic inflammatory demyelinating polyneuropathy: severe. Requires bilat foot supports. Followed outpt by neuro - continue lyrica  CAD/CABG: - continue lipitor, ASA - EKG   DVT prophylaxis: Lovenox  Code Status: full  Family Communication: wife  Disposition Plan: pending improvement in SBP  Consults called: CCS  Admission status: inpt    Teale Goodgame J MD [facility]  If 7PM-7AM, please contact night-coverage [email or web address] Password  [facility]  10/06/2016, 4:56 PM

## 2016-10-06 NOTE — ED Provider Notes (Signed)
[facility]-EMERGENCY DEPT Provider Note   CSN: [id number] Arrival date & time: 10/06/16 0233     History    Chief Complaint  Patient presents with  . Constipation  . Emesis     HPI Brandon Ray is a 82 y.o. male.  82yo M w/ PMH below including CABG, CIDP, SBO who p/w Constipation and decreased appetite. 3 days ago, the patient began having vomiting and abdominal distention. His vomiting has resolved over the past day and a half but he has continued to have constipation. He reports last bowel movement was last night and was small but normal. No urinary symptoms, fevers, or severe abdominal pain. His wife does report that he has intermittent complaint of abdominal pain. He reports mild cold symptoms.   Past Medical History:  Diagnosis Date  . BPH (benign prostatic hypertrophy) 2014  . Coronary artery disease 01/2003   CABG  . Free monoclonal light chain    urine  . Hyperlipidemia   . Peripheral neuropathy (HCC) 05/2011    follow up with Dr. Hart Carwin multiple issues  . Pleural effusion 05/2011   from abdominal surgery  . Seizures ([facility]) 1950's   last one 1960  . Small bowel obstruction 05/2011   due to adhesion  . Weakness 2013-May began   lower extemities     Patient Active Problem List   Diagnosis Date Noted  . Osteoporosis 03/13/2014  . Encounter for long-term (current) use of steroids 03/16/2013  . Primary male hypogonadism 10/19/2012  . Endocrinopathy 09/20/2012  . Gynecomastia, male 09/20/2012  . Fracture 09/20/2012  . Free monoclonal light chain   . Monoclonal (M) protein disease, multiple 'M' protein   . CIDP (chronic inflammatory demyelinating polyneuropathy) (HCC) 06/21/2012  . Constipation 06/20/2012  . Chronic inflammatory demyelinating polyneuropathy (HCC) 06/12/2012  . Acute urinary retention 06/12/2012  . Neurogenic bladder 06/12/2012  . Right sided weakness 06/07/2012  . Seizure disorder (HCC) 06/07/2012  . Pleural effusion on right 07/22/2011  .  Tachycardia 07/22/2011  . Small bowel obstruction, s/p lap LOA, c/b post op abscess 07/02/2011  . Coronary artery disease   . Hyperlipidemia     Past Surgical History:  Procedure Laterality Date  . ABDOMINAL SURGERY  05/2011   bowel obstruction  . ANKLE FRACTURE SURGERY  1997   left  . CARDIAC CATHETERIZATION  01/27/2003   NORMAL LEFT VENTRICULAR SIZE WITH MILD FOCAL LATERAL WALL HYPOKINESIA. EF 60%  . CORONARY ARTERY BYPASS GRAFT  2004   LIMA GRAFT TO THE LAD, SAPHENOUS VEIN GRAFT TO THE DIAGONAL, SAPHENOUS VEIN GRAFT TO THE FIRST OM, SAPHENOUS VEIN GRAFT TO THE PDA  . HAND SURGERY  2000   left  . TRANSURETHRAL RESECTION OF PROSTATE  1992  . TRANSURETHRAL RESECTION OF PROSTATE N/A 12/22/2012   Procedure: TRANSURETHRAL RESECTION OF THE PROSTATE WITH GYRUS (TURP);  Surgeon: Antony Haste, MD;  Location: [facility];  Service: Urology;  Laterality: N/A;        Home Medications    Prior to Admission medications   Medication Sig Start Date End Date Taking? Authorizing Provider  aspirin 81 MG tablet Take 81 mg by mouth daily.   Yes Historical Provider, MD  atorvastatin (LIPITOR) 20 MG tablet TAKE 1 TABLET(20 MG) BY MOUTH DAILY 07/26/16  Yes Peter M [place], MD  calcium-vitamin D (OSCAL WITH D) 500-200 MG-UNIT per tablet Take 1 tablet by mouth daily.     Yes Historical Provider, MD  Cholecalciferol 2000 UNITS TBDP Take 5,000 Units  by mouth daily.    Yes Historical Provider, MD  LYRICA 75 MG capsule Take 75 mg by mouth 3 (three) times daily.  08/24/16  Yes Historical Provider, MD  Multiple Vitamins-Minerals (PRESERVISION AREDS 2 PO) Take 1 tablet by mouth daily.   Yes Historical Provider, MD  Omega-3 Fatty Acids (FISH OIL) 1200 MG CAPS Take 1,200 mg by mouth daily.   Yes Historical Provider, MD  PHENobarbital (LUMINAL) 97.2 MG tablet Take 97.2 mg by mouth daily.     Yes Historical Provider, MD  polyethylene glycol (MIRALAX / GLYCOLAX) packet Take 17 g by mouth daily.    Yes Historical Provider, MD  tamsulosin (FLOMAX) 0.4 MG CAPS capsule Take 0.4 mg by mouth daily. 09/21/14  Yes Historical Provider, MD  testosterone (ANDROGEL) 50 MG/5GM (1%) GEL APPLY 5GM(1 PACKET) TO SKIN EVERY DAY 08/24/16  Yes Philemon Kingdom, MD  gabapentin (NEURONTIN) 300 MG capsule Take 600 mg by mouth 3 (three) times daily. 07/16/16   Historical Provider, MD      Family History  Problem Relation Age of Onset  . Asthma Mother   . Heart disease Mother 19  . Coronary artery disease Father   . Heart disease Father   . Cancer Sister 89    breast     Social History  Substance Use Topics  . Smoking status: Former Smoker    Packs/day: 1.00    Years: 5.00    Types: Cigarettes    Quit date: 07/01/1965  . Smokeless tobacco: Never Used  . Alcohol use No     Allergies     Boostrix [tetanus-diphth-acell pertussis]; Other; and Amoxicillin    Review of Systems  10 Systems reviewed and are negative for acute change except as noted in the HPI.   Physical Exam Updated Vital Signs BP 130/60   Pulse 74   Temp 97.9 F (36.6 C) (Oral)   Resp 17   Ht $R'6\' 1"'rK$  (1.854 m)   Wt 195 lb (88.5 kg)   SpO2 95%   BMI 25.73 kg/m   Physical Exam  Constitutional: He is oriented to person, place, and time. He appears well-developed and well-nourished. No distress.  HENT:  Head: Normocephalic and atraumatic.  Moist mucous membranes  Eyes: Conjunctivae are normal. Pupils are equal, round, and reactive to light.  Neck: Neck supple.  Cardiovascular: Normal rate, regular rhythm and normal heart sounds.   No murmur heard. Pulmonary/Chest: Effort normal and breath sounds normal.  Abdominal: Soft. Bowel sounds are normal. He exhibits no distension. There is tenderness. There is no rebound and no guarding.  Mild LLQ TTP  Musculoskeletal: He exhibits no edema.  Neurological: He is alert and oriented to person, place, and time.  Fluent speech  Skin: Skin is warm and dry.  Psychiatric: He  has a normal mood and affect. Judgment normal.  Nursing note and vitals reviewed.     ED Treatments / Results  Labs (all labs ordered are listed, but only abnormal results are displayed) Labs Reviewed  COMPREHENSIVE METABOLIC PANEL - Abnormal; Notable for the following:       Result Value   Chloride 100 (*)    Total Bilirubin 1.6 (*)    GFR calc non Af Amer 56 (*)    All other components within normal limits  CBC WITH DIFFERENTIAL/PLATELET - Abnormal; Notable for the following:    Hemoglobin 17.3 (*)    All other components within normal limits  I-STAT CG4 LACTIC ACID, ED     EKG  EKG Interpretation  Date/Time:    Ventricular Rate:    PR Interval:    QRS Duration:   QT Interval:    QTC Calculation:   R Axis:     Text Interpretation:           Radiology Ct Abdomen Pelvis W Contrast  Result Date: 10/06/2016 CLINICAL DATA:  Vomiting with decreased appetite and constipation. History of bowel obstruction. EXAM: CT ABDOMEN AND PELVIS WITH CONTRAST TECHNIQUE: Multidetector CT imaging of the abdomen and pelvis was performed using the standard protocol following bolus administration of intravenous contrast. CONTRAST:  127mL ISOVUE-300 IOPAMIDOL (ISOVUE-300) INJECTION 61% COMPARISON:  CT scan dated 07/08/2011 FINDINGS: Lower chest: Prior CABG. Small hiatal hernia with some edema of the mucosa of the distal esophagus suggesting reflux esophagitis. Heart size is normal. Small focal area of scarring or atelectasis at the right lung base posterior medially. Hepatobiliary: No focal liver abnormality is seen. No gallstones, gallbladder wall thickening, or biliary dilatation. Pancreas: Unremarkable. No pancreatic ductal dilatation or surrounding inflammatory changes. Spleen: Normal in size without focal abnormality. Adrenals/Urinary Tract: Adrenal glands are unremarkable. Kidneys are normal, without renal calculi, focal lesion, or hydronephrosis. Bladder is unremarkable. Stomach/Bowel:  There is small-bowel obstruction in the mid small bowel, best seen on image 55 of series 5. There is no mass at that site. Distal small bowel is decompressed. Colon is normal. Appendix is normal. Vascular/Lymphatic: Aortic atherosclerosis.  No adenopathy. Reproductive: Prostate gland is normal. The right testicle is slightly high in the right inguinal canal. Other: Small amount of free fluid in the pelvis.  No free air. Musculoskeletal: Slight arthritis of both hips. No acute abnormalities. IMPRESSION: Mid mid small bowel obstruction. Small hiatal hernia with edema of the mucosa of the distal esophagus suggesting esophagitis. Electronically Signed   By: Lorriane Shire M.D.   On: 10/06/2016 12:12    Procedures Procedures (including critical care time) Procedures  Medications Ordered in ED  Medications  iopamidol (ISOVUE-300) 61 % injection (100 mLs  Contrast Given 10/06/16 1154)  ondansetron (ZOFRAN) injection 4 mg (4 mg Intravenous Given 10/06/16 1328)  sodium chloride 0.9 % bolus 1,000 mL (1,000 mLs Intravenous New Bag/Given 10/06/16 1328)     Initial Impression / Assessment and Plan / ED Course  I have reviewed the triage vital signs and the nursing notes.  Pertinent labs & imaging results that were available during my care of the patient were reviewed by me and considered in my medical decision making (see chart for details).  Clinical Course     Patient with history of SBO presents with 3 days of decreased appetite, constipation, vomiting a few days ago. He is concerned that he has another SBO. He was well-appearing on exam with normal vital signs. No significant abdominal distention but he was tender in the left lower quadrant. Obtained above lab work which was unremarkable and showed normal WBC count. Obtained CT to evaluate for bowel obstruction or other acute process such as diverticulitis.  Labwork including lactate unremarkable. CT shows small bowel obstruction. Contacted general  surgery and discussed with Dr. Kieth Brightly, who recommended holding off on NGT for now until he evaluates pt. Pt will be admitted for further management.  Final Clinical Impressions(s) / ED Diagnoses   Final diagnoses:  Small bowel obstruction     New Prescriptions   No medications on file       Sharlett Iles, MD 10/06/16 1443

## 2016-10-06 NOTE — H&P (Signed)
[facility] Surgery Consult/Admission Note  Brandon Ray 28-Nov-1934  [id number].    Requesting MD: Dr. Edd Arbour Chief Complaint/Reason for Consult: SBO  HPI:  This is a 82 year old male with a history of CABG 2004, exploratory laparotomy with lysis of adhesions 2012, seizures on phenobarbital, CAD, BPH, peripheral neuropathy who presented to the [facility] emergency department with complaints of 3 days of abdominal pain, nausea, vomiting. Patient states the abdominal pain is in the left lower quadrant and supra pubic region, was intermittent but progressively worsened over the last 3 days, nonradiating, severe. Nothing made it better. Food and beverage made it worse. Associated nausea and vomiting. Patient has tried stool softeners, MiraLAX, suppositories, laxatives without relief. Patient has not ate or drank much in the last 3 days. Patient still having small bowel movements. Patient states it feels similar to his previous small bowel obstruction although not as severe. Patient denies chest pain, shortness breath, hematemesis, hematochezia, melena, dysuria, hematuria, fever, chills.  ED Course: Labs: Tbilli 1.6, Hg 17.3 all other labs unremarkable, VSS CT abd/pel: Mid mid small bowel obstruction. Small hiatal hernia with edema of the mucosa of the distal esophagus suggesting esophagitis.  ROS:  Review of Systems  Constitutional: Negative for chills, diaphoresis, fever and weight loss.  HENT: Negative for sore throat.   Eyes: Negative for discharge and redness.  Respiratory: Negative for shortness of breath.   Cardiovascular: Negative for chest pain.  Gastrointestinal: Positive for abdominal pain, nausea and vomiting. Negative for blood in stool, constipation and diarrhea.  Genitourinary: Negative for dysuria and hematuria.  Skin: Negative for rash.  Neurological: Positive for seizures (history of). Negative for dizziness, loss of consciousness and headaches.  All other  systems reviewed and are negative.    Family History  Problem Relation Age of Onset  . Asthma Mother   . Heart disease Mother 79  . Coronary artery disease Father   . Heart disease Father   . Cancer Sister 59    breast    Past Medical History:  Diagnosis Date  . BPH (benign prostatic hypertrophy) 2014  . Coronary artery disease 01/2003   CABG  . Free monoclonal light chain    urine  . Hyperlipidemia   . Peripheral neuropathy ([place]) 05/2011    follow up with Dr. Tish Frederickson multiple issues  . Pleural effusion 05/2011   from abdominal surgery  . Seizures ([place]) 1950's   last one 1960  . Small bowel obstruction 05/2011   due to adhesion  . Weakness 2013-May began   lower extemities    Past Surgical History:  Procedure Laterality Date  . ABDOMINAL SURGERY  05/2011   bowel obstruction  . [place]   left  . CARDIAC CATHETERIZATION  01/27/2003   NORMAL LEFT VENTRICULAR SIZE WITH MILD FOCAL LATERAL WALL HYPOKINESIA. EF 60%  . CORONARY ARTERY BYPASS GRAFT  2004   LIMA GRAFT TO THE LAD, SAPHENOUS VEIN GRAFT TO THE DIAGONAL, SAPHENOUS VEIN GRAFT TO THE FIRST OM, SAPHENOUS VEIN GRAFT TO THE PDA  . HAND SURGERY  2000   left  . TRANSURETHRAL RESECTION OF PROSTATE  1992  . TRANSURETHRAL RESECTION OF PROSTATE N/A 12/22/2012   Procedure: TRANSURETHRAL RESECTION OF THE PROSTATE WITH GYRUS (TURP);  Surgeon: Fredricka Bonine, MD;  Location: [facility];  Service: Urology;  Laterality: N/A;    Social History:  reports that he quit smoking about 51 years ago. His smoking use included Cigarettes. He has a 5.00  pack-year smoking history. He has never used smokeless tobacco. He reports that he does not drink alcohol or use drugs.  Allergies:  Allergies  Allergen Reactions  . Boostrix [Tetanus-Diphth-Acell Pertussis] Other (See Comments)    Neuropathy symptoms  . Other Other (See Comments)    Flu shot-neuropathy symptoms  . Amoxicillin Rash    Upper torso  only.     (Not in a hospital admission)  Blood pressure 130/60, pulse 74, temperature 97.9 F (36.6 C), temperature source Oral, resp. rate 17, height _0  (1.854 m), weight 195 lb (88.5 kg), SpO2 95 %.  Physical Exam  Constitutional: He is oriented to person, place, and time and well-developed, well-nourished, and in no distress. Vital signs are normal. No distress.  HENT:  Head: Normocephalic and atraumatic.  Nose: Nose normal.  Mouth/Throat: Mucous membranes are not pale and dry.  Eyes: Conjunctivae and lids are normal. No scleral icterus.  Neck: Normal range of motion. Neck supple.  Cardiovascular: S1 normal, S2 normal and normal heart sounds.  Exam reveals no gallop and no friction rub.   No murmur heard. Pulses:      Radial pulses are 2+ on the right side, and 2+ on the left side.  Pulmonary/Chest: Effort normal and breath sounds normal. He has no decreased breath sounds. He has no wheezes. He has no rhonchi. He has no rales.  Abdominal: Soft. Normal appearance. Bowel sounds are hyperactive. There is tenderness in the suprapubic area and left lower quadrant. There is no rebound.  Musculoskeletal: He exhibits edema (mild BLE ).  BLE braces  Neurological: He is alert and oriented to person, place, and time.  Skin: Skin is warm and dry. No rash noted. He is not diaphoretic.  Psychiatric: Mood and affect normal.    Results for orders placed or performed during the hospital encounter of 10/06/16 (from the past 48 hour(s))  Comprehensive metabolic panel     Status: Abnormal   Collection Time: 10/06/16  9:28 AM  Result Value Ref Range   Sodium 137 135 - 145 mmol/L   Potassium 4.3 3.5 - 5.1 mmol/L   Chloride 100 (L) 101 - 111 mmol/L   CO2 23 22 - 32 mmol/L   Glucose, Bld 88 65 - 99 mg/dL   BUN 19 6 - 20 mg/dL   Creatinine, Ser 1.19 0.61 - 1.24 mg/dL   Calcium 9.0 8.9 - 10.3 mg/dL   Total Protein 6.8 6.5 - 8.1 g/dL   Albumin 4.0 3.5 - 5.0 g/dL   AST 27 15 - 41 U/L   ALT 27  17 - 63 U/L   Alkaline Phosphatase 76 38 - 126 U/L   Total Bilirubin 1.6 (H) 0.3 - 1.2 mg/dL   GFR calc non Af Amer 56 (L) >60 mL/min   GFR calc Af Amer >60 >60 mL/min    Comment: (NOTE) The eGFR has been calculated using the CKD EPI equation. This calculation has not been validated in all clinical situations. eGFR's persistently <60 mL/min signify possible Chronic Kidney Disease.    Anion gap 14 5 - 15  CBC with Differential     Status: Abnormal   Collection Time: 10/06/16  9:28 AM  Result Value Ref Range   WBC 9.2 4.0 - 10.5 K/uL   RBC 5.34 4.22 - 5.81 MIL/uL   Hemoglobin 17.3 (H) 13.0 - 17.0 g/dL   HCT 50.7 39.0 - 52.0 %   MCV 94.9 78.0 - 100.0 fL   MCH 32.4 26.0 - 34.0  pg   MCHC 34.1 30.0 - 36.0 g/dL   RDW 14.6 11.5 - 15.5 %   Platelets 224 150 - 400 K/uL   Neutrophils Relative % 79 %   Neutro Abs 7.3 1.7 - 7.7 K/uL   Lymphocytes Relative 15 %   Lymphs Abs 1.3 0.7 - 4.0 K/uL   Monocytes Relative 5 %   Monocytes Absolute 0.5 0.1 - 1.0 K/uL   Eosinophils Relative 1 %   Eosinophils Absolute 0.1 0.0 - 0.7 K/uL   Basophils Relative 0 %   Basophils Absolute 0.0 0.0 - 0.1 K/uL  I-Stat CG4 Lactic Acid, ED     Status: None   Collection Time: 10/06/16  9:37 AM  Result Value Ref Range   Lactic Acid, Venous 1.23 0.5 - 1.9 mmol/L   Ct Abdomen Pelvis W Contrast  Result Date: 10/06/2016 CLINICAL DATA:  Vomiting with decreased appetite and constipation. History of bowel obstruction. EXAM: CT ABDOMEN AND PELVIS WITH CONTRAST TECHNIQUE: Multidetector CT imaging of the abdomen and pelvis was performed using the standard protocol following bolus administration of intravenous contrast. CONTRAST:  174m ISOVUE-300 IOPAMIDOL (ISOVUE-300) INJECTION 61% COMPARISON:  CT scan dated 07/08/2011 FINDINGS: Lower chest: Prior CABG. Small hiatal hernia with some edema of the mucosa of the distal esophagus suggesting reflux esophagitis. Heart size is normal. Small focal area of scarring or atelectasis at  the right lung base posterior medially. Hepatobiliary: No focal liver abnormality is seen. No gallstones, gallbladder wall thickening, or biliary dilatation. Pancreas: Unremarkable. No pancreatic ductal dilatation or surrounding inflammatory changes. Spleen: Normal in size without focal abnormality. Adrenals/Urinary Tract: Adrenal glands are unremarkable. Kidneys are normal, without renal calculi, focal lesion, or hydronephrosis. Bladder is unremarkable. Stomach/Bowel: There is small-bowel obstruction in the mid small bowel, best seen on image 55 of series 5. There is no mass at that site. Distal small bowel is decompressed. Colon is normal. Appendix is normal. Vascular/Lymphatic: Aortic atherosclerosis.  No adenopathy. Reproductive: Prostate gland is normal. The right testicle is slightly high in the right inguinal canal. Other: Small amount of free fluid in the pelvis.  No free air. Musculoskeletal: Slight arthritis of both hips. No acute abnormalities. IMPRESSION: Mid mid small bowel obstruction. Small hiatal hernia with edema of the mucosa of the distal esophagus suggesting esophagitis. Electronically Signed   By: JLorriane ShireM.D.   On: 10/06/2016 12:12      Assessment/Plan  Abdominal pain, nausea, vomiting - CT scan results reveal mild mid small bowel obstruction. Patient has hyperactive bowel sounds, nausea, vomiting, no abdominal distention, still having bowel movements. Clinically patient does not appear to have a small bowel obstruction. This could be an enteritis causing obstruction. We'll hold off on NG tube until tomorrow morning and see how patient does overnight. Will have patient admitted to medicine. No indication for surgery this time.  We will continue to follow this patient. Thank you for the consult.  JKalman Drape PMahoning [facility] IncSurgery 10/06/2016, 3:17 PM Pager: [phone]: [phone]-Fri 7:00 am-4:30 pm Sat-Sun 7:00 am-11:30 am

## 2016-10-06 NOTE — ED Notes (Signed)
Patient transported to CT 

## 2016-10-07 DIAGNOSIS — K56609 Unspecified intestinal obstruction, unspecified as to partial versus complete obstruction: Principal | ICD-10-CM

## 2016-10-07 LAB — COMPREHENSIVE METABOLIC PANEL
ALBUMIN: 3.1 g/dL — AB (ref 3.5–5.0)
ALK PHOS: 56 U/L (ref 38–126)
ALT: 22 U/L (ref 17–63)
ANION GAP: 6 (ref 5–15)
AST: 23 U/L (ref 15–41)
BILIRUBIN TOTAL: 1.1 mg/dL (ref 0.3–1.2)
BUN: 14 mg/dL (ref 6–20)
CALCIUM: 7.8 mg/dL — AB (ref 8.9–10.3)
CO2: 27 mmol/L (ref 22–32)
Chloride: 105 mmol/L (ref 101–111)
Creatinine, Ser: 0.89 mg/dL (ref 0.61–1.24)
GFR calc Af Amer: >60 mL/min (ref >60)
GFR calc non Af Amer: >60 mL/min (ref >60)
GLUCOSE: 108 mg/dL — AB (ref 65–99)
Potassium: 3.7 mmol/L (ref 3.5–5.1)
Sodium: 138 mmol/L (ref 135–145)
TOTAL PROTEIN: 5.1 g/dL — AB (ref 6.5–8.1)

## 2016-10-07 LAB — CBC
HEMATOCRIT: 42.2 % (ref 39.0–52.0)
HEMOGLOBIN: 14.2 g/dL (ref 13.0–17.0)
MCH: 32.5 pg (ref 26.0–34.0)
MCHC: 33.6 g/dL (ref 30.0–36.0)
MCV: 96.6 fL (ref 78.0–100.0)
Platelets: 193 10*3/uL (ref 150–400)
RBC: 4.37 MIL/uL (ref 4.22–5.81)
RDW: 14.6 % (ref 11.5–15.5)
WBC: 5.5 10*3/uL (ref 4.0–10.5)

## 2016-10-07 NOTE — Progress Notes (Addendum)
Patient ID: Brandon Ray, male   DOB: 1935/04/01, 82 y.o.   MRN: [id number]    PROGRESS NOTE    JAHMANI STAUP  WOE:[id number] DOB: 04/06/1935 DOA: 10/06/2016  PCP: [name], MD   Brief Narrative:  82 y.o. male with BPH, CAD status post CABG, HLD, severe peripheral neuropathy, seizures, SBO status post exploratory laparotomy with adhesions, presenting with approximately 4 week history of loose light-colored stools and 3 day history of abdominal pain.   Assessment & Plan:   SBO - h/o of the same in 2012 with ex lap and LOA.  - No NG tube at this time, pt is having BM's, no nausea, vomiting or abdominal pain. - surgery to advance diet to clears   ? Diarrhea - pt denies diarrhea   HLD - continue statin  BPH - continue Tamsulosin  DVT prophylaxis: Lovenox SQ Code Status: Full  Family Communication: Patient and wife at bedside  Disposition Plan: Home once surgery team clears   Consultants:   Surgery   Procedures:   None  Antimicrobials:   None   Subjective: No events overnight.   Objective: Vitals:   10/06/16 1645 10/06/16 1717 10/06/16 2105 10/07/16 0500  BP: 126/63 (!) 129/56 (!) 121/44 (!) 113/50  Pulse: 80 75 79 69  Resp:  18  14  Temp:  98.2 F (36.8 C) 98.5 F (36.9 C) 97.3 F (36.3 C)  TempSrc:  Oral Oral Oral  SpO2: 94% 96% 94% 95%  Weight:  88.5 kg (195 lb)    Height:  $Remove'6\' 1"'sdAfkFQ$  (1.854 m)      Intake/Output Summary (Last 24 hours) at 10/07/16 1211 Last data filed at 10/07/16 0700  Gross per 24 hour  Intake          1011.25 ml  Output              501 ml  Net           510.25 ml   Filed Weights   10/06/16 0903 10/06/16 1717  Weight: 88.5 kg (195 lb) 88.5 kg (195 lb)    Examination:  General exam: Appears calm and comfortable  Respiratory system: Clear to auscultation. Respiratory effort normal. Cardiovascular system: S1 & S2 heard, RRR. No JVD, murmurs, rubs, gallops or clicks. No pedal edema. Gastrointestinal system:  Abdomen is nondistended, soft and nontender. No organomegaly or masses felt. Normal bowel sounds heard. Central nervous system: Alert and oriented. No focal neurological deficits. Extremities: Symmetric 5 x 5 power. Skin: No rashes, lesions or ulcers Psychiatry: Judgement and insight appear normal. Mood & affect appropriate.    Data Reviewed: I have personally reviewed following labs and imaging studies  CBC:  Recent Labs Lab 10/06/16 0928 10/07/16 0521  WBC 9.2 5.5  NEUTROABS 7.3  --   HGB 17.3* 14.2  HCT 50.7 42.2  MCV 94.9 96.6  PLT 224 003   Basic Metabolic Panel:  Recent Labs Lab 10/06/16 0928 10/07/16 0521  NA 137 138  K 4.3 3.7  CL 100* 105  CO2 23 27  GLUCOSE 88 108*  BUN 19 14  CREATININE 1.19 0.89  CALCIUM 9.0 7.8*   Liver Function Tests:  Recent Labs Lab 10/06/16 0928 10/07/16 0521  AST 27 23  ALT 27 22  ALKPHOS 76 56  BILITOT 1.6* 1.1  PROT 6.8 5.1*  ALBUMIN 4.0 3.1*   Radiology Studies: Ct Abdomen Pelvis W Contrast  Result Date: 10/06/2016 CLINICAL DATA:  Vomiting with decreased appetite and constipation. History  of bowel obstruction. EXAM: CT ABDOMEN AND PELVIS WITH CONTRAST TECHNIQUE: Multidetector CT imaging of the abdomen and pelvis was performed using the standard protocol following bolus administration of intravenous contrast. CONTRAST:  135mL ISOVUE-300 IOPAMIDOL (ISOVUE-300) INJECTION 61% COMPARISON:  CT scan dated 07/08/2011 FINDINGS: Lower chest: Prior CABG. Small hiatal hernia with some edema of the mucosa of the distal esophagus suggesting reflux esophagitis. Heart size is normal. Small focal area of scarring or atelectasis at the right lung base posterior medially. Hepatobiliary: No focal liver abnormality is seen. No gallstones, gallbladder wall thickening, or biliary dilatation. Pancreas: Unremarkable. No pancreatic ductal dilatation or surrounding inflammatory changes. Spleen: Normal in size without focal abnormality. Adrenals/Urinary  Tract: Adrenal glands are unremarkable. Kidneys are normal, without renal calculi, focal lesion, or hydronephrosis. Bladder is unremarkable. Stomach/Bowel: There is small-bowel obstruction in the mid small bowel, best seen on image 55 of series 5. There is no mass at that site. Distal small bowel is decompressed. Colon is normal. Appendix is normal. Vascular/Lymphatic: Aortic atherosclerosis.  No adenopathy. Reproductive: Prostate gland is normal. The right testicle is slightly high in the right inguinal canal. Other: Small amount of free fluid in the pelvis.  No free air. Musculoskeletal: Slight arthritis of both hips. No acute abnormalities. IMPRESSION: Mid mid small bowel obstruction. Small hiatal hernia with edema of the mucosa of the distal esophagus suggesting esophagitis. Electronically Signed   By: Lorriane Shire M.D.   On: 10/06/2016 12:12      Scheduled Meds: . aspirin EC  81 mg Oral Daily  . atorvastatin  20 mg Oral q1800  . enoxaparin (LOVENOX) injection  40 mg Subcutaneous Q24H  . PHENobarbital  97.2 mg Oral Daily  . pregabalin  75 mg Oral TID  . tamsulosin  0.4 mg Oral Daily   Continuous Infusions: . dextrose 5 % and 0.45% NaCl 75 mL/hr at 10/07/16 0549     LOS: 1 day   Time spent: 20 minutes   Faye Ramsay, MD [facility] Pager [phone]  If 7PM-7AM, please contact night-coverage [email or web address] Password [email or web address] 10/07/2016, 12:11 PM

## 2016-10-07 NOTE — Progress Notes (Signed)
[facility] Progress Note     Subjective: Pt had 2 small loose BM's last night and this morning. No nausea or vomiting. No abdominal pain or bloating. No fever or chills overnight.   Objective: Vital signs in last 24 hours: Temp:  [97.3 F (36.3 C)-98.5 F (36.9 C)] 97.3 F (36.3 C) (01/18 0500) Pulse Rate:  [69-97] 69 (01/18 0500) Resp:  [14-18] 14 (01/18 0500) BP: (108-138)/(44-96) 113/50 (01/18 0500) SpO2:  [92 %-97 %] 95 % (01/18 0500) Weight:  [195 lb (88.5 kg)] 195 lb (88.5 kg) (01/17 1717)    Intake/Output from previous day: 01/17 0701 - 01/18 0700 In: 1011.3 [I.V.:1011.3] Out: 1 [Stool:1] Intake/Output this shift: No intake/output data recorded.  PE: Gen:  Alert, NAD, pleasant, cooperative, lying in bed Card:  Normal S1 and S2, no M/G/R heard  Pulm:  Rate and effort normal Abd: Soft, non distended, +BS, very mild TTP to LLQ Skin: no rashes noted, warm and dry  Lab Results:   Recent Labs  10/06/16 0928  WBC 9.2  HGB 17.3*  HCT 50.7  PLT 224   BMET  Recent Labs  10/06/16 0928 10/07/16 0521  NA 137 138  K 4.3 3.7  CL 100* 105  CO2 23 27  GLUCOSE 88 108*  BUN 19 14  CREATININE 1.19 0.89  CALCIUM 9.0 7.8*   PT/INR No results for input(s): LABPROT, INR in the last 72 hours. CMP     Component Value Date/Time   NA 138 10/07/2016 0521   NA 143 01/26/2013 0748   K 3.7 10/07/2016 0521   K 4.1 01/26/2013 0748   CL 105 10/07/2016 0521   CL 103 01/26/2013 0748   CO2 27 10/07/2016 0521   CO2 28 01/26/2013 0748   GLUCOSE 108 (H) 10/07/2016 0521   GLUCOSE 102 (H) 01/26/2013 0748   BUN 14 10/07/2016 0521   BUN 18.7 01/26/2013 0748   CREATININE 0.89 10/07/2016 0521   CREATININE 0.84 09/06/2016 1508   CREATININE 1.0 01/26/2013 0748   CALCIUM 7.8 (L) 10/07/2016 0521   CALCIUM 9.1 01/26/2013 0748   PROT 5.1 (L) 10/07/2016 0521   PROT 7.0 01/26/2013 0748   ALBUMIN 3.1 (L) 10/07/2016 0521   ALBUMIN 3.9 01/26/2013 0748   AST 23  10/07/2016 0521   AST 28 01/26/2013 0748   ALT 22 10/07/2016 0521   ALT 32 01/26/2013 0748   ALKPHOS 56 10/07/2016 0521   ALKPHOS 84 01/26/2013 0748   BILITOT 1.1 10/07/2016 0521   BILITOT 0.41 01/26/2013 0748   GFRNONAA >60 10/07/2016 0521   GFRAA >60 10/07/2016 0521   Lipase     Component Value Date/Time   LIPASE 18 06/16/2011 1858       Studies/Results: Ct Abdomen Pelvis W Contrast  Result Date: 10/06/2016 CLINICAL DATA:  Vomiting with decreased appetite and constipation. History of bowel obstruction. EXAM: CT ABDOMEN AND PELVIS WITH CONTRAST TECHNIQUE: Multidetector CT imaging of the abdomen and pelvis was performed using the standard protocol following bolus administration of intravenous contrast. CONTRAST:  167mL ISOVUE-300 IOPAMIDOL (ISOVUE-300) INJECTION 61% COMPARISON:  CT scan dated 07/08/2011 FINDINGS: Lower chest: Prior CABG. Small hiatal hernia with some edema of the mucosa of the distal esophagus suggesting reflux esophagitis. Heart size is normal. Small focal area of scarring or atelectasis at the right lung base posterior medially. Hepatobiliary: No focal liver abnormality is seen. No gallstones, gallbladder wall thickening, or biliary dilatation. Pancreas: Unremarkable. No pancreatic ductal dilatation or surrounding inflammatory changes. Spleen: Normal in  size without focal abnormality. Adrenals/Urinary Tract: Adrenal glands are unremarkable. Kidneys are normal, without renal calculi, focal lesion, or hydronephrosis. Bladder is unremarkable. Stomach/Bowel: There is small-bowel obstruction in the mid small bowel, best seen on image 55 of series 5. There is no mass at that site. Distal small bowel is decompressed. Colon is normal. Appendix is normal. Vascular/Lymphatic: Aortic atherosclerosis.  No adenopathy. Reproductive: Prostate gland is normal. The right testicle is slightly high in the right inguinal canal. Other: Small amount of free fluid in the pelvis.  No free air.  Musculoskeletal: Slight arthritis of both hips. No acute abnormalities. IMPRESSION: Mid mid small bowel obstruction. Small hiatal hernia with edema of the mucosa of the distal esophagus suggesting esophagitis. Electronically Signed   By: Lorriane Shire M.D.   On: 10/06/2016 12:12    Anti-infectives: Anti-infectives    None       Assessment/Plan   Active problems Diarrhea: Ongoing for 3-4 weeks. Malnutrition:  AKI: Mild. Cr 1.19. Baseline 0.8. BPH: - continue flomax Seizures - contnue Phenobarb Chronic inflammatory demyelinating polyneuropathy: severe. Requires bilat foot supports.- continue lyrica CAD/CABG: ASA  SBO: h/o of the same in 2012 with ex lap and LOA.  - Adhesions vs viral etiology.  - No NG tube at this time, pt is having BM's, no nausea, vomiting or abdominal pain. - will advance to clears  FEN: clears  Plan: Pt does not appear to be obstructed at this time. Will advance to clears and see if he tolerates them. No need for further imaging at this time. We will continue to follow.     LOS: 1 day    Kalman Drape , [facility] 10/07/2016, 7:49 AM Pager: [phone] Consults: [phone] Mon-Fri 7:00 am-4:30 pm Sat-Sun 7:00 am-11:30 am

## 2016-10-08 LAB — URINALYSIS, ROUTINE W REFLEX MICROSCOPIC
Bilirubin Urine: NEGATIVE
Glucose, UA: NEGATIVE mg/dL
Ketones, ur: NEGATIVE mg/dL
Nitrite: NEGATIVE
Protein, ur: NEGATIVE mg/dL
SPECIFIC GRAVITY, URINE: 1.004 — AB (ref 1.005–1.030)
pH: 7 (ref 5.0–8.0)

## 2016-10-08 LAB — BASIC METABOLIC PANEL
Anion gap: 5 (ref 5–15)
BUN: 5 mg/dL — AB (ref 6–20)
CHLORIDE: 103 mmol/L (ref 101–111)
CO2: 27 mmol/L (ref 22–32)
CREATININE: 0.65 mg/dL (ref 0.61–1.24)
Calcium: 7.5 mg/dL — ABNORMAL LOW (ref 8.9–10.3)
GFR calc Af Amer: >60 mL/min (ref >60)
GFR calc non Af Amer: >60 mL/min (ref >60)
GLUCOSE: 111 mg/dL — AB (ref 65–99)
Potassium: 3.3 mmol/L — ABNORMAL LOW (ref 3.5–5.1)
SODIUM: 135 mmol/L (ref 135–145)

## 2016-10-08 LAB — CBC
HCT: 40.4 % (ref 39.0–52.0)
HEMOGLOBIN: 13.6 g/dL (ref 13.0–17.0)
MCH: 31.8 pg (ref 26.0–34.0)
MCHC: 33.7 g/dL (ref 30.0–36.0)
MCV: 94.4 fL (ref 78.0–100.0)
PLATELETS: 161 10*3/uL (ref 150–400)
RBC: 4.28 MIL/uL (ref 4.22–5.81)
RDW: 14.1 % (ref 11.5–15.5)
WBC: 4.3 10*3/uL (ref 4.0–10.5)

## 2016-10-08 MED ORDER — POTASSIUM CHLORIDE CRYS ER 20 MEQ PO TBCR
40.0000 meq | EXTENDED_RELEASE_TABLET | Freq: Once | ORAL | Status: AC
  Administered 2016-10-08: 40 meq via ORAL
  Filled 2016-10-08: qty 2

## 2016-10-08 MED ORDER — POLYETHYLENE GLYCOL 3350 17 G PO PACK
17.0000 g | PACK | Freq: Two times a day (BID) | ORAL | Status: DC
  Administered 2016-10-08 – 2016-10-09 (×2): 17 g via ORAL
  Filled 2016-10-08 (×2): qty 1

## 2016-10-08 NOTE — Progress Notes (Signed)
Patient ID: Brandon Ray, male   DOB: [birth year], 82 y.o.   MRN: [id number]    PROGRESS NOTE  Brandon Ray  WNU:[id number] DOB: 1934/09/23 DOA: 10/06/2016  PCP: [name], MD   Brief Narrative:  82 y.o. male with BPH, CAD status post CABG, HLD, severe peripheral neuropathy, seizures, SBO status post exploratory laparotomy with adhesions, presenting with approximately 4 week history of loose light-colored stools and 3 day history of abdominal pain.   Assessment & Plan:   SBO - h/o of the same in 2012 with ex lap and LOA.  - No NG tube at this time, pt is having BM's, no nausea, vomiting or abdominal pain. - pt tolerating clears and flatus + - plan to advance diet to full liquids   Hypokalemia - supplement and repeat BMP in AM  ? Diarrhea - pt denies diarrhea and has not had any since admission   HLD - continue statin  BPH with difficulty emptying bladder  - continue Tamsulosin - bladder scan already requested   DVT prophylaxis: Lovenox SQ Code Status: Full  Family Communication: Patient and wife at bedside  Disposition Plan: Home once surgery team clears   Consultants:   Surgery   Procedures:   None  Antimicrobials:   None  Subjective: No events overnight.   Objective: Vitals:   10/06/16 2105 10/07/16 0500 10/07/16 2143 10/08/16 0445  BP: (!) 121/44 (!) 113/50 123/64 112/60  Pulse: 79 69 65 67  Resp:  $Remo'14 16 15  'BaqbZ$ Temp: 98.5 F (36.9 C) 97.3 F (36.3 C) 97.5 F (36.4 C) 98.2 F (36.8 C)  TempSrc: Oral Oral Oral Oral  SpO2: 94% 95% 100% 100%  Weight:      Height:        Intake/Output Summary (Last 24 hours) at 10/08/16 0939 Last data filed at 10/08/16 0914  Gross per 24 hour  Intake             1650 ml  Output             1900 ml  Net             -250 ml   Filed Weights   10/06/16 0903 10/06/16 1717  Weight: 88.5 kg (195 lb) 88.5 kg (195 lb)    Examination:  General exam: Appears calm and comfortable  Respiratory system:  Clear to auscultation. Respiratory effort normal. Cardiovascular system: S1 & S2 heard, RRR. No JVD, murmurs, rubs, gallops or clicks. No pedal edema. Gastrointestinal system: Abdomen is nondistended, soft and nontender. No organomegaly or masses felt.  Central nervous system: Alert and oriented. No focal neurological deficits. Extremities: Symmetric 5 x 5 power.  Data Reviewed: I have personally reviewed following labs and imaging studies  CBC:  Recent Labs Lab 10/06/16 0928 10/07/16 0521 10/08/16 0555  WBC 9.2 5.5 4.3  NEUTROABS 7.3  --   --   HGB 17.3* 14.2 13.6  HCT 50.7 42.2 40.4  MCV 94.9 96.6 94.4  PLT 224 193 034   Basic Metabolic Panel:  Recent Labs Lab 10/06/16 0928 10/07/16 0521 10/08/16 0555  NA 137 138 135  K 4.3 3.7 3.3*  CL 100* 105 103  CO2 $Re'23 27 27  'lnv$ GLUCOSE 88 108* 111*  BUN 19 14 5*  CREATININE 1.19 0.89 0.65  CALCIUM 9.0 7.8* 7.5*   Liver Function Tests:  Recent Labs Lab 10/06/16 0928 10/07/16 0521  AST 27 23  ALT 27 22  ALKPHOS 76 56  BILITOT 1.6* 1.1  PROT 6.8 5.1*  ALBUMIN 4.0 3.1*   Radiology Studies: Ct Abdomen Pelvis W Contrast  Result Date: 10/06/2016 CLINICAL DATA:  Vomiting with decreased appetite and constipation. History of bowel obstruction. EXAM: CT ABDOMEN AND PELVIS WITH CONTRAST TECHNIQUE: Multidetector CT imaging of the abdomen and pelvis was performed using the standard protocol following bolus administration of intravenous contrast. CONTRAST:  127mL ISOVUE-300 IOPAMIDOL (ISOVUE-300) INJECTION 61% COMPARISON:  CT scan dated 07/08/2011 FINDINGS: Lower chest: Prior CABG. Small hiatal hernia with some edema of the mucosa of the distal esophagus suggesting reflux esophagitis. Heart size is normal. Small focal area of scarring or atelectasis at the right lung base posterior medially. Hepatobiliary: No focal liver abnormality is seen. No gallstones, gallbladder wall thickening, or biliary dilatation. Pancreas: Unremarkable. No  pancreatic ductal dilatation or surrounding inflammatory changes. Spleen: Normal in size without focal abnormality. Adrenals/Urinary Tract: Adrenal glands are unremarkable. Kidneys are normal, without renal calculi, focal lesion, or hydronephrosis. Bladder is unremarkable. Stomach/Bowel: There is small-bowel obstruction in the mid small bowel, best seen on image 55 of series 5. There is no mass at that site. Distal small bowel is decompressed. Colon is normal. Appendix is normal. Vascular/Lymphatic: Aortic atherosclerosis.  No adenopathy. Reproductive: Prostate gland is normal. The right testicle is slightly high in the right inguinal canal. Other: Small amount of free fluid in the pelvis.  No free air. Musculoskeletal: Slight arthritis of both hips. No acute abnormalities. IMPRESSION: Mid mid small bowel obstruction. Small hiatal hernia with edema of the mucosa of the distal esophagus suggesting esophagitis. Electronically Signed   By: Lorriane Shire M.D.   On: 10/06/2016 12:12      Scheduled Meds: . aspirin EC  81 mg Oral Daily  . atorvastatin  20 mg Oral q1800  . enoxaparin (LOVENOX) injection  40 mg Subcutaneous Q24H  . PHENobarbital  97.2 mg Oral Daily  . pregabalin  75 mg Oral TID  . tamsulosin  0.4 mg Oral Daily   Continuous Infusions: . dextrose 5 % and 0.45% NaCl 75 mL/hr at 10/08/16 0720     LOS: 2 days   Time spent: 20 minutes   Faye Ramsay, MD [facility] Pager [phone]  If 7PM-7AM, please contact night-coverage [email or web address] Password [facility] 10/08/2016, 9:39 AM

## 2016-10-08 NOTE — Progress Notes (Signed)
[facility] Progress Note     Subjective: Pt is not having any abdominal pain. He is tolerating his diet. No nausea, vomiting, fever, chills. Still having small BM's and flatus. Pt states he has only urinated roughly 100cc since 5pm yesterday. He states he feels bloated from this. Pt has a history of neuropathy of his bladder and sometimes has difficulty urinating.   Objective: Vital signs in last 24 hours: Temp:  [97.5 F (36.4 C)-98.2 F (36.8 C)] 98.2 F (36.8 C) (01/19 0445) Pulse Rate:  [65-67] 67 (01/19 0445) Resp:  [15-16] 15 (01/19 0445) BP: (112-123)/(60-64) 112/60 (01/19 0445) SpO2:  [100 %] 100 % (01/19 0445) Last BM Date: 10/07/16  Intake/Output from previous day: 01/18 0701 - 01/19 0700 In: 1650 [I.V.:1650] Out: 900 [Urine:900] Intake/Output this shift: No intake/output data recorded.  PE: Gen:  Alert, NAD, pleasant, cooperative, lying in bed Card:  Normal S1 and S2, no M/G/R heard  Pulm:  Rate and effort normal Abd: Soft, non distended, +BS, no TTP Skin: no rashes noted, warm and dry  Lab Results:   Recent Labs  10/07/16 0521 10/08/16 0555  WBC 5.5 4.3  HGB 14.2 13.6  HCT 42.2 40.4  PLT 193 161   BMET  Recent Labs  10/07/16 0521 10/08/16 0555  NA 138 135  K 3.7 3.3*  CL 105 103  CO2 27 27  GLUCOSE 108* 111*  BUN 14 5*  CREATININE 0.89 0.65  CALCIUM 7.8* 7.5*   PT/INR No results for input(s): LABPROT, INR in the last 72 hours. CMP     Component Value Date/Time   NA 135 10/08/2016 0555   NA 143 01/26/2013 0748   K 3.3 (L) 10/08/2016 0555   K 4.1 01/26/2013 0748   CL 103 10/08/2016 0555   CL 103 01/26/2013 0748   CO2 27 10/08/2016 0555   CO2 28 01/26/2013 0748   GLUCOSE 111 (H) 10/08/2016 0555   GLUCOSE 102 (H) 01/26/2013 0748   BUN 5 (L) 10/08/2016 0555   BUN 18.7 01/26/2013 0748   CREATININE 0.65 10/08/2016 0555   CREATININE 0.84 09/06/2016 1508   CREATININE 1.0 01/26/2013 0748   CALCIUM 7.5 (L) 10/08/2016 0555    CALCIUM 9.1 01/26/2013 0748   PROT 5.1 (L) 10/07/2016 0521   PROT 7.0 01/26/2013 0748   ALBUMIN 3.1 (L) 10/07/2016 0521   ALBUMIN 3.9 01/26/2013 0748   AST 23 10/07/2016 0521   AST 28 01/26/2013 0748   ALT 22 10/07/2016 0521   ALT 32 01/26/2013 0748   ALKPHOS 56 10/07/2016 0521   ALKPHOS 84 01/26/2013 0748   BILITOT 1.1 10/07/2016 0521   BILITOT 0.41 01/26/2013 0748   GFRNONAA >60 10/08/2016 0555   GFRAA >60 10/08/2016 0555   Lipase     Component Value Date/Time   LIPASE 18 06/16/2011 1858       Studies/Results: Ct Abdomen Pelvis W Contrast  Result Date: 10/06/2016 CLINICAL DATA:  Vomiting with decreased appetite and constipation. History of bowel obstruction. EXAM: CT ABDOMEN AND PELVIS WITH CONTRAST TECHNIQUE: Multidetector CT imaging of the abdomen and pelvis was performed using the standard protocol following bolus administration of intravenous contrast. CONTRAST:  123mL ISOVUE-300 IOPAMIDOL (ISOVUE-300) INJECTION 61% COMPARISON:  CT scan dated 07/08/2011 FINDINGS: Lower chest: Prior CABG. Small hiatal hernia with some edema of the mucosa of the distal esophagus suggesting reflux esophagitis. Heart size is normal. Small focal area of scarring or atelectasis at the right lung base posterior medially. Hepatobiliary: No focal liver  abnormality is seen. No gallstones, gallbladder wall thickening, or biliary dilatation. Pancreas: Unremarkable. No pancreatic ductal dilatation or surrounding inflammatory changes. Spleen: Normal in size without focal abnormality. Adrenals/Urinary Tract: Adrenal glands are unremarkable. Kidneys are normal, without renal calculi, focal lesion, or hydronephrosis. Bladder is unremarkable. Stomach/Bowel: There is small-bowel obstruction in the mid small bowel, best seen on image 55 of series 5. There is no mass at that site. Distal small bowel is decompressed. Colon is normal. Appendix is normal. Vascular/Lymphatic: Aortic atherosclerosis.  No adenopathy.  Reproductive: Prostate gland is normal. The right testicle is slightly high in the right inguinal canal. Other: Small amount of free fluid in the pelvis.  No free air. Musculoskeletal: Slight arthritis of both hips. No acute abnormalities. IMPRESSION: Mid mid small bowel obstruction. Small hiatal hernia with edema of the mucosa of the distal esophagus suggesting esophagitis. Electronically Signed   By: Lorriane Shire M.D.   On: 10/06/2016 12:12    Anti-infectives: Anti-infectives    None       Assessment/Plan  Active problems Diarrhea: Ongoing for 3-4 weeks. Malnutrition:  AKI: Mild. Cr 1.19. Baseline 0.8. BPH: - continue flomax Seizures - contnue Phenobarb Chronic inflammatory demyelinating polyneuropathy: severe. Requires bilat foot supports.- continue lyrica CAD/CABG: ASA  SBO: h/o of the same in 2012 with ex lap and LOA.  - Adhesions vs viral etiology.  - No NG tube at this time, pt is having BM's, no nausea, vomiting or abdominal pain. - will advance to fulls - bladder scan, continue flomax   FEN: fulls  Plan: Pt does not appear to be obstructed at this time. Will advance to fulls and see if he tolerates them. No need for further imaging at this time. Will do a bladder scan and in and out cath if pt has >300 in his bladder. We will continue to follow.    LOS: 2 days    Kalman Drape , [facility] 10/08/2016, 7:48 AM Pager: [phone] Consults: [phone] Mon-Fri 7:00 am-4:30 pm Sat-Sun 7:00 am-11:30 am

## 2016-10-09 LAB — BASIC METABOLIC PANEL
Anion gap: 6 (ref 5–15)
BUN: 5 mg/dL — AB (ref 6–20)
CHLORIDE: 107 mmol/L (ref 101–111)
CO2: 26 mmol/L (ref 22–32)
Calcium: 8 mg/dL — ABNORMAL LOW (ref 8.9–10.3)
Creatinine, Ser: 0.72 mg/dL (ref 0.61–1.24)
GFR calc Af Amer: >60 mL/min (ref >60)
GFR calc non Af Amer: >60 mL/min (ref >60)
GLUCOSE: 94 mg/dL (ref 65–99)
POTASSIUM: 4 mmol/L (ref 3.5–5.1)
Sodium: 139 mmol/L (ref 135–145)

## 2016-10-09 LAB — CBC
HCT: 41.3 % (ref 39.0–52.0)
HEMOGLOBIN: 13.8 g/dL (ref 13.0–17.0)
MCH: 31.4 pg (ref 26.0–34.0)
MCHC: 33.4 g/dL (ref 30.0–36.0)
MCV: 94.1 fL (ref 78.0–100.0)
Platelets: 182 10*3/uL (ref 150–400)
RBC: 4.39 MIL/uL (ref 4.22–5.81)
RDW: 13.9 % (ref 11.5–15.5)
WBC: 4.5 10*3/uL (ref 4.0–10.5)

## 2016-10-09 MED ORDER — BISACODYL 10 MG RE SUPP
5.0000 mg | Freq: Once | RECTAL | Status: AC
  Administered 2016-10-09: 5 mg via RECTAL

## 2016-10-09 NOTE — Discharge Instructions (Signed)
Small Bowel Obstruction °A small bowel obstruction is a blockage in the small bowel. The small bowel, which is also called the small intestine, is a long, slender tube that connects the stomach to the colon. When a person eats and drinks, food and fluids go from the stomach to the small bowel. This is where most of the nutrients in the food and fluids are absorbed. °A small bowel obstruction will prevent food and fluids from passing through the small bowel as they normally do during digestion. The small bowel can become partially or completely blocked. This can cause symptoms such as abdominal pain, vomiting, and bloating. If this condition is not treated, it can be dangerous because the small bowel could rupture. °What are the causes? °Common causes of this condition include: °· Scar tissue from previous surgery or radiation treatment. °· Recent surgery. This may cause the movements of the bowel to slow down and cause food to block the intestine. °· Hernias. °· Inflammatory bowel disease (colitis). °· Twisting of the bowel (volvulus). °· Tumors. °· A foreign body. °· Slipping of a part of the bowel into another part (intussusception). °What are the signs or symptoms? °Symptoms of this condition include: °· Abdominal pain. This may be dull cramps or sharp pain. It may occur in one area, or it may be present in the entire abdomen. Pain can range from mild to severe, depending on the degree of obstruction. °· Nausea and vomiting. Vomit may be greenish or a yellow bile color. °· Abdominal bloating. °· Constipation. °· Lack of passing gas. °· Frequent belching. °· Diarrhea. This may occur if the obstruction is partial and runny stool is able to leak around the obstruction. °How is this diagnosed? °This condition may be diagnosed based on a physical exam, medical history, and X-rays of the abdomen. You may also have other tests, such as a CT scan of the abdomen and pelvis. °How is this treated? °Treatment for this  condition depends on the cause and severity of the problem. Treatment options may include: °· Bed rest along with fluids and pain medicines that are given through an IV tube inserted into one of your veins. Sometimes, this is all that is needed for the obstruction to improve. °· Following a simple diet. In some cases, a clear liquid diet may be required for several days. This allows the bowel to rest. °· Placement of a small tube (nasogastric tube) into the stomach. When the bowel is blocked, it usually swells up like a balloon that is filled with air and fluids. The air and fluids may be removed by suction through the nasogastric tube. This can help with pain, discomfort, and nausea. It can also help the obstruction to clear up faster. °· Surgery. This may be required if other treatments do not work. Bowel obstruction from a hernia may require early surgery and can be an emergency procedure. Surgery may also be required for scar tissue that causes frequent or severe obstructions. °Follow these instructions at home: °· Get plenty of rest. °· Follow instructions from your health care provider about eating restrictions. You may need to avoid solid foods and consume only clear liquids until your condition improves. °· Take over-the-counter and prescription medicines only as told by your health care provider. °· Keep all follow-up visits as told by your health care provider. This is important. °Contact a health care provider if: °· You have a fever. °· You have chills. °Get help right away if: °· You have increased   pain or cramping. °· You vomit blood. °· You have uncontrolled vomiting or nausea. °· You cannot drink fluids because of vomiting or pain. °· You develop confusion. °· You begin feeling very dry or thirsty (dehydrated). °· You have severe bloating. °· You feel extremely weak or you faint. °This information is not intended to replace advice given to you by your health care provider. Make sure you discuss any  questions you have with your health care provider. °Document Released: 11/23/2005 Document Revised: 05/03/2016 Document Reviewed: 10/31/2014 °Elsevier Interactive Patient Education © 2017 [employer] ° °

## 2016-10-09 NOTE — Progress Notes (Signed)
  Subjective: No complaints. Tolerating diet and passing flatus  Objective: Vital signs in last 24 hours: Temp:  [97.3 F (36.3 C)-98.9 F (37.2 C)] 98.9 F (37.2 C) (01/20 0543) Pulse Rate:  [70-82] 70 (01/20 0543) Resp:  [16-17] 17 (01/20 0543) BP: (116-131)/(65-77) 116/65 (01/20 0543) SpO2:  [95 %-100 %] 96 % (01/20 0543) Last BM Date: 10/08/16  Intake/Output from previous day: 01/19 0701 - 01/20 0700 In: 1647.5 [P.O.:480; I.V.:1167.5] Out: 4000 [Urine:4000] Intake/Output this shift: Total I/O In: 549.2 [I.V.:549.2] Out: 800 [Urine:800]  Resp: clear to auscultation bilaterally Cardio: regular rate and rhythm GI: soft, nontender. not distended  Lab Results:   Recent Labs  10/08/16 0555 10/09/16 0438  WBC 4.3 4.5  HGB 13.6 13.8  HCT 40.4 41.3  PLT 161 182   BMET  Recent Labs  10/08/16 0555 10/09/16 0438  NA 135 139  K 3.3* 4.0  CL 103 107  CO2 27 26  GLUCOSE 111* 94  BUN 5* 5*  CREATININE 0.65 0.72  CALCIUM 7.5* 8.0*   PT/INR No results for input(s): LABPROT, INR in the last 72 hours. ABG No results for input(s): PHART, HCO3 in the last 72 hours.  Invalid input(s): PCO2, PO2  Studies/Results: No results found.  Anti-infectives: Anti-infectives    None      Assessment/Plan: s/p * No surgery found * Advance diet  sbo seems to have resolved. Will sign off  LOS: 3 days    TOTH III,PAUL S 10/09/2016

## 2016-10-09 NOTE — Discharge Summary (Signed)
Physician Discharge Summary  Brandon Ray EXH:[id number] DOB: [birth year] DOA: 80/17/2018  PCP: [name], MD  Admit date: 80/17/2018 Discharge date: 10/10/2016  Recommendations for Outpatient Follow-up:  1. Pt will need to follow up with PCP in 2-3 weeks post discharge 2. Please obtain BMP to evaluate electrolytes and kidney function  Discharge Diagnoses:  Active Problems:   Coronary artery disease   Hyperlipidemia   Seizure disorder (HCC)   CIDP (chronic inflammatory demyelinating polyneuropathy) ([place])   Primary male hypogonadism   SBO (small bowel obstruction)   Diarrhea   AKI (acute kidney injury) ([place])  Discharge Condition: Stable  Diet recommendation: Heart healthy diet discussed in details   Brief Narrative:  82 y.o.malewith BPH, CAD status post CABG, HLD, severe peripheral neuropathy, seizures, SBO status post exploratory laparotomy with adhesions, presenting with approximately 4 week history of loose light-colored stools and 3 day history of abdominal pain.   Assessment & Plan:   SBO - h/o of the same in 2012 with ex lap and LOA.  - No NG tube at this time, pt is having BM's, no nausea, vomiting or abdominal pain. - pt tolerating clears and flatus + - plan to advance diet to regular, added stool laxative as pt with no BM yet per wife (pt says he did have BM) - plan to discharge in AM  Hypokalemia - supplement and repeat BMP in AM  ? Diarrhea - pt denies diarrhea and has not had any since admission   HLD - continue statin  BPH with difficulty emptying bladder  - continue Tamsulosin  DVT prophylaxis: Lovenox SQ Code Status: Full  Family Communication: Patient and wife at bedside  Disposition Plan: Home in AM if pt has BM  Consultants:   Surgery   Procedures:   None  Antimicrobials:   None   Procedures/Studies: Ct Abdomen Pelvis W Contrast  Result Date: 10/06/2016 CLINICAL DATA:  Vomiting with decreased appetite and  constipation. History of bowel obstruction. EXAM: CT ABDOMEN AND PELVIS WITH CONTRAST TECHNIQUE: Multidetector CT imaging of the abdomen and pelvis was performed using the standard protocol following bolus administration of intravenous contrast. CONTRAST:  116mL ISOVUE-300 IOPAMIDOL (ISOVUE-300) INJECTION 61% COMPARISON:  CT scan dated 07/08/2011 FINDINGS: Lower chest: Prior CABG. Small hiatal hernia with some edema of the mucosa of the distal esophagus suggesting reflux esophagitis. Heart size is normal. Small focal area of scarring or atelectasis at the right lung base posterior medially. Hepatobiliary: No focal liver abnormality is seen. No gallstones, gallbladder wall thickening, or biliary dilatation. Pancreas: Unremarkable. No pancreatic ductal dilatation or surrounding inflammatory changes. Spleen: Normal in size without focal abnormality. Adrenals/Urinary Tract: Adrenal glands are unremarkable. Kidneys are normal, without renal calculi, focal lesion, or hydronephrosis. Bladder is unremarkable. Stomach/Bowel: There is small-bowel obstruction in the mid small bowel, best seen on image 55 of series 5. There is no mass at that site. Distal small bowel is decompressed. Colon is normal. Appendix is normal. Vascular/Lymphatic: Aortic atherosclerosis.  No adenopathy. Reproductive: Prostate gland is normal. The right testicle is slightly high in the right inguinal canal. Other: Small amount of free fluid in the pelvis.  No free air. Musculoskeletal: Slight arthritis of both hips. No acute abnormalities. IMPRESSION: Mid mid small bowel obstruction. Small hiatal hernia with edema of the mucosa of the distal esophagus suggesting esophagitis. Electronically Signed   By: Lorriane Shire M.D.   On: 10/06/2016 12:12     Discharge Exam: Vitals:   10/08/16 2036 10/09/16 0543  BP:  131/75 116/65  Pulse: 77 70  Resp: 17 17  Temp: 97.3 F (36.3 C) 98.9 F (37.2 C)   Vitals:   10/08/16 0445 10/08/16 1436 10/08/16  2036 10/09/16 0543  BP: 112/60 130/77 131/75 116/65  Pulse: 67 82 77 70  Resp: $Remo'15 16 17 17  'QKxnG$ Temp: 98.2 F (36.8 C) 98.2 F (36.8 C) 97.3 F (36.3 C) 98.9 F (37.2 C)  TempSrc: Oral Oral Oral Oral  SpO2: 100% 100% 95% 96%  Weight:      Height:        General: Pt is alert, follows commands appropriately, not in acute distress Cardiovascular: Regular rate and rhythm, S1/S2 +, no rubs, no gallops Respiratory: Clear to auscultation bilaterally, no wheezing, no crackles, no rhonchi Abdominal: Soft, non tender, non distended, bowel sounds +, no guarding Neuro: Grossly nonfocal  Discharge Instructions  Discharge Instructions    Diet - low sodium heart healthy    Complete by:  As directed    Increase activity slowly    Complete by:  As directed      Allergies as of 10/09/2016      Reactions   Boostrix [tetanus-diphth-acell Pertussis] Other (See Comments)   Neuropathy symptoms   Other Other (See Comments)   Flu shot-neuropathy symptoms   Amoxicillin Rash   Upper torso only.      Medication List    TAKE these medications   aspirin 81 MG tablet Take 81 mg by mouth daily.   atorvastatin 20 MG tablet Commonly known as:  LIPITOR TAKE 1 TABLET(20 MG) BY MOUTH DAILY   calcium-vitamin D 500-200 MG-UNIT tablet Commonly known as:  OSCAL WITH D Take 1 tablet by mouth daily.   Cholecalciferol 2000 units Tbdp Take 5,000 Units by mouth daily.   Fish Oil 1200 MG Caps Take 1,200 mg by mouth daily.   gabapentin 300 MG capsule Commonly known as:  NEURONTIN Take 600 mg by mouth 3 (three) times daily.   LYRICA 75 MG capsule Generic drug:  pregabalin Take 75 mg by mouth 3 (three) times daily.   PHENobarbital 97.2 MG tablet Commonly known as:  LUMINAL Take 97.2 mg by mouth daily.   polyethylene glycol packet Commonly known as:  MIRALAX / GLYCOLAX Take 17 g by mouth daily.   PRESERVISION AREDS 2 PO Take 1 tablet by mouth daily.   tamsulosin 0.4 MG Caps capsule Commonly  known as:  FLOMAX Take 0.4 mg by mouth daily.   testosterone 50 MG/5GM (1%) Gel Commonly known as:  ANDROGEL APPLY 5GM(1 PACKET) TO SKIN EVERY DAY      Follow-up Information    [name], MD Follow up.   Specialty:  Family Medicine Contact information: [place] [address] [phone]        Faye Ramsay, MD Follow up.   Specialty:  Internal Medicine Why:  call my cell phone [phone] with any questions  Contact information: [address] [place] [address] [phone]            The results of significant diagnostics from this hospitalization (including imaging, microbiology, ancillary and laboratory) are listed below for reference.     Microbiology: No results found for this or any previous visit (from the past 240 hour(s)).   Labs: Basic Metabolic Panel:  Recent Labs Lab 10/06/16 0928 10/07/16 0521 10/08/16 0555 10/09/16 0438  NA 137 138 135 139  K 4.3 3.7 3.3* 4.0  CL 100* 105 103 107  CO2 23  $'27 27 26  'm$ GLUCOSE 88 108* 111* 94  BUN 19 14 5* 5*  CREATININE 1.19 0.89 0.65 0.72  CALCIUM 9.0 7.8* 7.5* 8.0*   Liver Function Tests:  Recent Labs Lab 10/06/16 0928 10/07/16 0521  AST 27 23  ALT 27 22  ALKPHOS 76 56  BILITOT 1.6* 1.1  PROT 6.8 5.1*  ALBUMIN 4.0 3.1*   CBC:  Recent Labs Lab 10/06/16 0928 10/07/16 0521 10/08/16 0555 10/09/16 0438  WBC 9.2 5.5 4.3 4.5  NEUTROABS 7.3  --   --   --   HGB 17.3* 14.2 13.6 13.8  HCT 50.7 42.2 40.4 41.3  MCV 94.9 96.6 94.4 94.1  PLT 224 193 161 182   SIGNED: Time coordinating discharge: 30 minutes  MAGICK-Pharoah Goggins, MD  [facility] 10/09/2016, 10:01 AM Pager [phone]  If 7PM-7AM, please contact night-coverage [email or web address] Password [email or web address]

## 2016-10-09 NOTE — Progress Notes (Signed)
Discharge instructions gone over with patient and spouse.  Home medications gone over. Follow up appointment to be made. Diet and activity discussed. Bowel regimen discussed. Reasons to call the doctor and signs and symptoms of worsening condition discussed. Patient verbalized understanding of instructions.

## 2016-10-13 DIAGNOSIS — D472 Monoclonal gammopathy: Secondary | ICD-10-CM | POA: Diagnosis not present

## 2016-10-14 ENCOUNTER — Other Ambulatory Visit: Payer: Self-pay | Admitting: Cardiology

## 2016-10-14 MED ORDER — ATORVASTATIN CALCIUM 20 MG PO TABS: 20.0000 mg | ORAL_TABLET | Freq: Every day | ORAL | 3 refills | Status: DC

## 2016-10-14 NOTE — Telephone Encounter (Signed)
E SENT PRESCRIPTION  TO PHARMACY

## 2016-10-19 ENCOUNTER — Telehealth: Payer: Self-pay | Admitting: Internal Medicine

## 2016-10-19 ENCOUNTER — Ambulatory Visit (INDEPENDENT_AMBULATORY_CARE_PROVIDER_SITE_OTHER): Payer: Medicare HMO | Admitting: Internal Medicine

## 2016-10-19 ENCOUNTER — Encounter: Payer: Self-pay | Admitting: Internal Medicine

## 2016-10-19 ENCOUNTER — Telehealth: Payer: Self-pay

## 2016-10-19 VITALS — BP 124/72 | HR 66 | Ht 73.0 in | Wt 194.0 lb

## 2016-10-19 DIAGNOSIS — N62 Hypertrophy of breast: Secondary | ICD-10-CM | POA: Diagnosis not present

## 2016-10-19 DIAGNOSIS — M810 Age-related osteoporosis without current pathological fracture: Secondary | ICD-10-CM | POA: Diagnosis not present

## 2016-10-19 DIAGNOSIS — G6289 Other specified polyneuropathies: Secondary | ICD-10-CM | POA: Diagnosis not present

## 2016-10-19 DIAGNOSIS — E291 Testicular hypofunction: Secondary | ICD-10-CM | POA: Diagnosis not present

## 2016-10-19 DIAGNOSIS — G629 Polyneuropathy, unspecified: Secondary | ICD-10-CM

## 2016-10-19 LAB — PSA, MEDICARE: PSA: 5.89 ng/ml — ABNORMAL HIGH (ref 0.10–4.00)

## 2016-10-19 LAB — VITAMIN D 25 HYDROXY (VIT D DEFICIENCY, FRACTURES): VITD: 58.28 ng/mL (ref 30.00–100.00)

## 2016-10-19 LAB — VITAMIN B12: VITAMIN B 12: 225 pg/mL (ref 211–911)

## 2016-10-19 NOTE — Progress Notes (Signed)
Subjective:     Patient ID: Brandon Ray, male   DOB: Aug 31, 1935, 82 y.o.   MRN: [id number]  HPI Brandon Ray is a very pleasant 82 y.o. man with h/o severe polyneuropathy and monoclonal gammopathy, returning for f/u for primary hypogonadism, osteoporosis + h/o sacral fracture, and long-term steroid use. Last visit 1 year ago.   He was admitted for SBO earlier this month. He believes that this is because of worsening of his generalized neuropathy.  He had low Calcium inhouse as he was NPO: Lab Results  Component Value Date   CALCIUM 8.0 (L) 10/09/2016   CALCIUM 7.5 (L) 10/08/2016   CALCIUM 7.8 (L) 10/07/2016   He had R knee surgery Spring 2017.  Reviewed hx: He has severe neuropathy. He also still has discomfort in the mid thoracic region, pelvic region and his hands.  He was previously investigated for POEMS sd. - prev. Seen by Dr Lamonte Sakai, then had an evaluation by Dr. Karie Kirks. Tuchman (Heme/Onc at [facility]) for this, but it was concluded that he actually has MGUS and will be just followed for it. He also had a fat pad biopsy to rule out amyloidosis and this was negative.   Please see my previous notes regarding patient's past medical history. He does have polyneuropathy, monoclonal gammopathy, gynecomastia and primary hypogonadism, but no organomegaly or significant skin changes. He also sees Dr. Tessa Lerner in pain clinic,  Dr Trinna Post (neurology) at the [facility]. Dr. Rushie Goltz is his local neurologist ([facility]). 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   He Was on Neurontin high doses >> not helping >> sees Pain Clinic [facility] >> tried Cymbalta + Neurontin >> did not help >> now on Lyrica, which finally gives him some relief.  Patient also has primary hypogonadism. We started testosterone gel at 5 grams daily, then we increased the dose to 7.5 g daily, which he could not tolerate well: his libido increased, felt tension in his pelvis and also felt that his breasts were more  tense. He went back to the 5g daily, then he decreased further to 5 mg 5/7 days. He continues this dose now, but has been off during his hospital stay.  Reviewed pertinent labs: Component     Latest Ref Rng & Units 09/14/2013 03/13/2014 10/04/2014 04/16/2015  Testosterone     250 - 827 ng/dL 800 1,002 (H) 283 (L) 553  Sex Horm Binding Glob, Serum     22 - 77 nmol/L 76 (H) 65 73 46  Testosterone Free     47.0 - 244.0 pg/mL 100.1 151.6 31.7 (L) 95.3  Testosterone-% Free     1.6 - 2.9 % 1.3 (L) 1.5 (L) 1.1 (L) 1.7  PSA     0.10 - 4.00 ng/mL  0.48  0.35   Component     Latest Ref Rng & Units 10/20/2015  Testosterone     250 - 827 ng/dL 251  Sex Horm Binding Glob, Serum     22 - 77 nmol/L 55  Testosterone Free     47.0 - 244.0 pg/mL 34.6 (L)  Testosterone-% Free     1.6 - 2.9 % 1.4 (L)  PSA     0.10 - 4.00 ng/mL 0.30  At last visit, his free testosterone level was normal as he was skipping more doses of testosterone than usual.  Last Hb/HT normal: Lab Results  Component Value Date   WBC 4.5 10/09/2016   HGB 13.8 10/09/2016   HCT 41.3  10/09/2016   MCV 94.1 10/09/2016   PLT 182 10/09/2016  10/15/2015: Hb 15.8/HT 47.8 04/15/2015: Hb 16.4 (13.7-17.3)/HT 50.2 (39-49)  Last DRE: 11/2015  Patient also has gynecomastia, with no evidence of malignancy on a bilateral mammogram, likely 2/2 his primary hypogonadism, but he does not feel much improved after starting testosterone. His prolactin was normal. He has dysesthesia at the level of his nipples, probably related to his neuropathy >> improved on Lyrica  Patient has been using high-dose steroids for his neuropathy (started at 60 mg in 07/2012 >> stopped Prednisone around 09/2013).  Patient with low bone mineral density, but not in the range for osteoporosis, however with a sacral fracture and also a right big toe fracture (04/2013) likely 2/2 steroid tx. No fractures since last visit.    DEXA 11/10/2014: L1-L4 T score of +0.1 and  right femoral neck -1.4, left femoral neck -1.2 DEXA 11/06/2012: L1-L4 T score of -0.9 and dual femur T score of -1.1  Due to his multiple fractures, even though he scores were in the osteopenic range, he has a diagnosis of osteoporosis. We started Prolia >> had 4 injections.  He is tolerating Prolia well.  He takes vit D 5000 IU + Calcium 600 mg daily.   Last vitamin D was normal: Lab Results  Component Value Date   VD25OH 54.39 10/20/2015   VD25OH 64.27 04/16/2015   VD25OH 56.34 10/04/2014   VD25OH 67 09/14/2013   VD25OH 55 09/28/2012   Review of Systems - see above also Constitutional: no weight gain, no fatigue, no subjective hyperthermia, + nocturia Eyes: no more blurry vision(had cataract sx), no xerophthalmia ENT: no sore throat, no nodules palpated in throat, no dysphagia/odynophagia, no hoarseness Cardiovascular: no CP/SOB/palpitations/+ leg swelling Respiratory: no cough/SOB Gastrointestinal: no N/V/D/C Musculoskeletal: no muscle/+ joint aches.  Skin: no rashes  I reviewed pt's medications, allergies, PMH, social hx, family hx, and changes were documented in the history of present illness. Otherwise, unchanged from my initial visit note.  Objective:   Physical Exam BP 124/72 (BP Location: Left Arm, Patient Position: Sitting)   Pulse 66   Ht $R'6\' 1"'uH$  (1.854 m)   Wt 194 lb (88 kg)   BMI 25.60 kg/m  Body mass index is 25.6 kg/m. Wt Readings from Last 3 Encounters:  10/19/16 194 lb (88 kg)  10/06/16 195 lb (88.5 kg)  09/06/16 198 lb 3.2 oz (89.9 kg)   Constitutional: overweight, in NAD. Walks with a cane. Eyes: PERRLA, EOMI, no exophthalmos ENT: moist mucous membranes, no thyromegaly, no cervical lymphadenopathy Cardiovascular: RRR, No MRG, + nonpitting bilateral edema Respiratory: CTA B Gastrointestinal: abdomen soft, NT, ND, BS+ Musculoskeletal: kyphosis. Skin: moist, warm Neurological: mild tremor with outstretched hands, no DTRs detected  Assessment:      1. Primary hypogonadism - + gynecomastia bilaterally - per mammography + testicular atrophy - He is on testosterone gel >> a sensation of pressure in abdomen, but this did not subside after stopping testosterone gel for 1 mo after his surgery for Transurethral resection of prostate residual growth in 12/22/2012.   2. Osteoporosis - h/o sacral fracture, right big toe fracture (04/2013), spine X-ray: no vb fx - Off steroids now - on adequate Ca+vit D, vit D level normal at last visit. Will recheck today - On Prolia, without side effects  3. Polyneuropathy - unlikely POEMS syndrome per evaluation by Hem/Onc at [facility] - amyloidosis ruled out by negative fat pad biopsy - managed by Dr. Trinna Post at [facility] and Dr.  Gable at [facility]   4. Long-term use of steroids - previously on prednisone 60 mg starting at the end of 2013 - Was able to taper >> now off since 09/2013  5. MGUS - monoclonal gammopathy - now managed by Dr.Tuchman at [facility], previously Dr. Gaylyn Rong    Plan:     1. Primary hypogonadism - Patient continues his Androgel 5 g approximately 5 out of 7 days. He has periods of time, when his children are visiting, when he does not take it, for approximately 1 week at a time, however he took it recently, except for the period when he was in the hospital. - He still has pelvic pressure (unclear if related to the testosterone tx, since he had this even when he stopped the testosterone in the past).  - He is up-to-date with DRE's per his urologist, Dr. Mena Goes - next visit with him is in 11/2016 - we will check testosterone level today and a PSA. Reviewed last CBC (09/2016) >> normal Hb, HT >> continue the testosterone at the same dose.  - Continue AndroGel 1%  5 g 5 out of 7 days - Last free testosterone level was slightly low, as he was skipping more doses than normal  2. Osteoporosis  - Osteoporosis due to h/o sacral fx, despite osteopenia dx on DEXA scan - He has been off prednisone and  Dilantin, on testosterone replacement and also on Prolia. No recent fx's. - will check another DEXA scan next month .Blima Singer  discussed that  after he finishes 3-6 years of Prolia, we need to continue with po or IV bisphosphonate x 1, at least.  - continue vitamin D repletion 5000 units daily - will recheck level today. Last level was normal. - We'll continue calcium 600 mg daily. I advised him to use chewables, since he inquires about the liquid calcium form. His calcium levels were low in the hospital, but dose were checked and he was nothing by mouth. - will schedule the next appointment in 1 year  3. Polyneuropathy - Per his request, we'll check a B12 vitamin level. We will add the methylmalonic acid. Per my review of the chart, I do not have recent levels. Will add a B6 vitamin check. - We also discussed about the addition of alpha lipoic acid and benfothiamine >> he will research these  Orders Placed This Encounter  Procedures  . DG Bone Density  . Vitamin B12  . Methylmalonic Acid, Serum  . Vitamin B6  . VITAMIN D 25 Hydroxy (Vit-D Deficiency, Fractures)  . PSA, Medicare  . Testosterone, Free, Total, SHBG   CC: Drs. Jaci Carrel  Office Visit on 10/19/2016  Component Date Value Ref Range Status  . Vitamin B-12 10/19/2016 225  211 - 911 pg/mL Final  . Methylmalonic Acid, Quant 10/19/2016 185  87 - 318 nmol/L Final  . Vitamin B6 10/19/2016 12.8  2.1 - 21.7 ng/mL Final   Comment: Vitamin supplementation within 24 hours prior to blood draw may affect the accuracy of the results. This test was developed and its analytical performance characteristics have been determined by [facility] [place], [place]. It has not been cleared or approved by the U.S. Food and Drug Administration. This assay has been validated pursuant to the CLIA regulations and is used for clinical purposes.   Marland Kitchen VITD 10/19/2016 58.28  30.00 - 100.00 ng/mL Final  . PSA 10/19/2016 5.89*  0.10 - 4.00 ng/ml Final  . Testosterone 10/19/2016 862  264 - 916  ng/dL Final   Comment: Adult male reference interval is based on a population of healthy nonobese males (BMI <30) between 93 and 22 years old. [place], [place] [phone]. PMID: 45859292.   Marland Kitchen Testosterone, Free 10/19/2016 10.3  6.6 - 18.1 pg/mL Final  . Sex Hormone Binding 10/19/2016 71.5  19.3 - 76.4 nmol/L Final   Vitamin B12 is very low, I suggested to start B12 po 5000 g and we will need to check the level again in 2-3 months. Vit D normal. PSA elevated >> Dr. Junious Silk aware >> he will repeat pt's PSA soon. Will stop testosterone until then.  - time spent with the patient: 40 min, of which >50% was spent in obtaining information about his symptoms, reviewing his previous labs, evaluations, and treatments, counseling him about his conditions (please see the discussed topics above), and developing a plan to further investigate them; he also had a number of questions which I addressed.  Philemon Kingdom, MD PhD [facility] Endocrinology

## 2016-10-19 NOTE — Patient Instructions (Addendum)
Please stop at the lab.  Continue 5000 units of vitamin D and 600 mg calcium (can use chewables).  Continue 5 g testosterone gel 5/7 days.  Continue Prolia.  Look up: - Alpha-lipoic acid (600 mg 2x a day). - Benfothiamine   GI: Dr. Valentino Hue  Please return in 1 year.

## 2016-10-19 NOTE — Telephone Encounter (Signed)
Pt is going to be out of town the week of the dexa on the 23rd. And will be able to do the dexa starting on 2/27

## 2016-10-19 NOTE — Telephone Encounter (Signed)
Called and advised patient that we had rescheduled his bone density for March 5th at 9:00 patient understood and had no questions at this time.

## 2016-10-19 NOTE — Telephone Encounter (Signed)
Called and LVM for patient advising of scheduled bone density scheduled. Gave call back number if any questions.

## 2016-10-20 LAB — TESTOSTERONE, FREE, TOTAL, SHBG
Sex Hormone Binding: 71.5 nmol/L (ref 19.3–76.4)
TESTOSTERONE: 862 ng/dL (ref 264–916)
Testosterone, Free: 10.3 pg/mL (ref 6.6–18.1)

## 2016-10-21 ENCOUNTER — Encounter: Payer: Self-pay | Admitting: Internal Medicine

## 2016-10-22 ENCOUNTER — Other Ambulatory Visit: Payer: Self-pay | Admitting: Internal Medicine

## 2016-10-22 DIAGNOSIS — E538 Deficiency of other specified B group vitamins: Secondary | ICD-10-CM

## 2016-10-22 LAB — METHYLMALONIC ACID, SERUM: Methylmalonic Acid, Quant: 185 nmol/L (ref 87–318)

## 2016-10-23 LAB — VITAMIN B6: VITAMIN B6: 12.8 ng/mL (ref 2.1–21.7)

## 2016-11-12 ENCOUNTER — Other Ambulatory Visit: Payer: Medicare HMO

## 2016-11-16 DIAGNOSIS — R69 Illness, unspecified: Secondary | ICD-10-CM | POA: Diagnosis not present

## 2016-11-18 DIAGNOSIS — Z125 Encounter for screening for malignant neoplasm of prostate: Secondary | ICD-10-CM | POA: Diagnosis not present

## 2016-11-22 ENCOUNTER — Ambulatory Visit (INDEPENDENT_AMBULATORY_CARE_PROVIDER_SITE_OTHER)
Admission: RE | Admit: 2016-11-22 | Discharge: 2016-11-22 | Disposition: A | Discharge status: Home / Self Care | Payer: Medicare HMO | Source: Ambulatory Visit | Attending: Internal Medicine | Admitting: Internal Medicine

## 2016-11-22 DIAGNOSIS — M810 Age-related osteoporosis without current pathological fracture: Secondary | ICD-10-CM

## 2016-12-07 DIAGNOSIS — Z961 Presence of intraocular lens: Secondary | ICD-10-CM | POA: Diagnosis not present

## 2016-12-07 DIAGNOSIS — H01001 Unspecified blepharitis right upper eyelid: Secondary | ICD-10-CM | POA: Diagnosis not present

## 2016-12-07 DIAGNOSIS — H04123 Dry eye syndrome of bilateral lacrimal glands: Secondary | ICD-10-CM | POA: Diagnosis not present

## 2016-12-07 DIAGNOSIS — H43813 Vitreous degeneration, bilateral: Secondary | ICD-10-CM | POA: Diagnosis not present

## 2016-12-08 DIAGNOSIS — N400 Enlarged prostate without lower urinary tract symptoms: Secondary | ICD-10-CM | POA: Diagnosis not present

## 2016-12-08 DIAGNOSIS — N5201 Erectile dysfunction due to arterial insufficiency: Secondary | ICD-10-CM | POA: Diagnosis not present

## 2016-12-20 ENCOUNTER — Telehealth: Payer: Self-pay | Admitting: Cardiology

## 2016-12-20 NOTE — Telephone Encounter (Signed)
Returned call to patient left message on personal voice mail Dr.Jordan's recommendations.

## 2016-12-20 NOTE — Telephone Encounter (Signed)
°   New message    pt verbalized that he is calling to speak to rn  To see+   if he can come in annually or the 6 months as shown in recall

## 2016-12-20 NOTE — Telephone Encounter (Signed)
Once a year is fine  Campbell Agramonte Martinique MD, [facility]

## 2016-12-30 ENCOUNTER — Telehealth: Payer: Self-pay | Admitting: *Deleted

## 2016-12-30 NOTE — Telephone Encounter (Signed)
PA form received.

## 2016-12-30 NOTE — Telephone Encounter (Signed)
Information has been submitted to pts insurance for verification of benefits. Awaiting response for coverage  Form received indicating PA required for prolia injection. Form faxed to [facility] for completion by prescribing provider

## 2016-12-31 NOTE — Telephone Encounter (Signed)
Pa will be given to Dr. Cruzita Lederer to complete on 05/05/2017.

## 2017-01-05 NOTE — Telephone Encounter (Signed)
Verification of benefits have been processed and an approval has been received for pts prolia injection. Pts estimated cost are appx $260. This is only an estimate and cannot be confirmed until benefits are paid. Please advise pt and schedule if needed. If scheduled, once the injection is received, pls contact me back with the date it was received so that I am able to update prolia folder. thanks  

## 2017-01-05 NOTE — Telephone Encounter (Signed)
Attempted to reach the patient. Patient was not available and did not have a working Advertising account executive. Will try again at a later time.

## 2017-01-10 ENCOUNTER — Telehealth: Payer: Self-pay

## 2017-01-10 NOTE — Telephone Encounter (Signed)
Called and LVM advising of Prolia information. Left call back number.

## 2017-01-10 NOTE — Telephone Encounter (Signed)
Called and advised of Prolia information. Left message on phone, and advised to call back if any questions.

## 2017-01-11 ENCOUNTER — Telehealth: Payer: Self-pay

## 2017-01-11 ENCOUNTER — Other Ambulatory Visit: Payer: Self-pay

## 2017-01-11 ENCOUNTER — Telehealth: Payer: Self-pay | Admitting: Internal Medicine

## 2017-01-11 DIAGNOSIS — N62 Hypertrophy of breast: Secondary | ICD-10-CM

## 2017-01-11 NOTE — Telephone Encounter (Signed)
Yes, absolutely, we can recheck his PSA also. Can you please order ?

## 2017-01-11 NOTE — Telephone Encounter (Signed)
Patient called, he is okay with scheduling the Prolia injection.   Patient would like to do a repeat TSA, vitamin b 12, and the prolia all in once visit. I see the order for a vitamin b 12, but patient would like to repeat the other test as well. Is this okay?  I will call patient back to schedule for lab if so. He states he could do it sometime in May if that would be okay.

## 2017-01-11 NOTE — Telephone Encounter (Signed)
Patient calling back concerning prolia shot.

## 2017-01-11 NOTE — Telephone Encounter (Signed)
Called patient back, LVM advising patient to return phone call regarding prolia information.

## 2017-01-11 NOTE — Telephone Encounter (Signed)
Ordered, scheduled lab appointment.

## 2017-01-11 NOTE — Telephone Encounter (Signed)
Called and notified patient of lab work, made lab appointment and also notified that we could do Prolia then. Patient understood and had no questions.

## 2017-01-17 DIAGNOSIS — H353133 Nonexudative age-related macular degeneration, bilateral, advanced atrophic without subfoveal involvement: Secondary | ICD-10-CM | POA: Diagnosis not present

## 2017-01-17 DIAGNOSIS — H35371 Puckering of macula, right eye: Secondary | ICD-10-CM | POA: Diagnosis not present

## 2017-01-17 DIAGNOSIS — H353212 Exudative age-related macular degeneration, right eye, with inactive choroidal neovascularization: Secondary | ICD-10-CM | POA: Diagnosis not present

## 2017-01-17 DIAGNOSIS — H43813 Vitreous degeneration, bilateral: Secondary | ICD-10-CM | POA: Diagnosis not present

## 2017-01-25 DIAGNOSIS — G610 Guillain-Barre syndrome: Secondary | ICD-10-CM | POA: Diagnosis not present

## 2017-01-25 DIAGNOSIS — D472 Monoclonal gammopathy: Secondary | ICD-10-CM | POA: Diagnosis not present

## 2017-01-25 DIAGNOSIS — Z8669 Personal history of other diseases of the nervous system and sense organs: Secondary | ICD-10-CM | POA: Diagnosis not present

## 2017-01-31 ENCOUNTER — Other Ambulatory Visit (INDEPENDENT_AMBULATORY_CARE_PROVIDER_SITE_OTHER): Payer: Medicare HMO

## 2017-01-31 ENCOUNTER — Ambulatory Visit (INDEPENDENT_AMBULATORY_CARE_PROVIDER_SITE_OTHER): Payer: Medicare HMO

## 2017-01-31 DIAGNOSIS — M810 Age-related osteoporosis without current pathological fracture: Secondary | ICD-10-CM | POA: Diagnosis not present

## 2017-01-31 DIAGNOSIS — G629 Polyneuropathy, unspecified: Secondary | ICD-10-CM | POA: Diagnosis not present

## 2017-01-31 DIAGNOSIS — N62 Hypertrophy of breast: Secondary | ICD-10-CM

## 2017-01-31 LAB — VITAMIN B12: Vitamin B-12: >1500 pg/mL — ABNORMAL HIGH (ref 211–911)

## 2017-01-31 LAB — PSA: PSA: 0.2 ng/mL (ref 0.10–4.00)

## 2017-01-31 MED ORDER — DENOSUMAB 60 MG/ML ~~LOC~~ SOLN
60.0000 mg | Freq: Once | SUBCUTANEOUS | Status: AC
  Administered 2017-01-31: 60 mg via SUBCUTANEOUS

## 2017-02-16 DIAGNOSIS — R1032 Left lower quadrant pain: Secondary | ICD-10-CM | POA: Diagnosis not present

## 2017-02-16 DIAGNOSIS — M545 Low back pain: Secondary | ICD-10-CM | POA: Diagnosis not present

## 2017-03-03 NOTE — Progress Notes (Unsigned)
Error

## 2017-03-31 DIAGNOSIS — M79672 Pain in left foot: Secondary | ICD-10-CM | POA: Insufficient documentation

## 2017-03-31 DIAGNOSIS — G629 Polyneuropathy, unspecified: Secondary | ICD-10-CM | POA: Diagnosis not present

## 2017-03-31 DIAGNOSIS — M79642 Pain in left hand: Secondary | ICD-10-CM | POA: Diagnosis not present

## 2017-03-31 DIAGNOSIS — D472 Monoclonal gammopathy: Secondary | ICD-10-CM | POA: Diagnosis not present

## 2017-03-31 DIAGNOSIS — M79671 Pain in right foot: Secondary | ICD-10-CM | POA: Insufficient documentation

## 2017-03-31 DIAGNOSIS — M79641 Pain in right hand: Secondary | ICD-10-CM | POA: Diagnosis not present

## 2017-04-06 ENCOUNTER — Telehealth: Payer: Self-pay | Admitting: Cardiology

## 2017-04-06 NOTE — Telephone Encounter (Signed)
Brandon Ray is calling because he had dental work and they recommended that he take ibuprofen twice every 6 hours for 4 days as anti inflammatory  . He wants know to can he takes this with his condition.

## 2017-04-06 NOTE — Telephone Encounter (Signed)
Message routed to MD/pharmacy staff for med/med history review regarding use of ibuprofen

## 2017-04-06 NOTE — Telephone Encounter (Signed)
Talked to patient today. Okay to take ibuprofen as prescribed for up to 4 days. Avoid chronic use of ibuprofen and all other NSAIDs .

## 2017-05-16 DIAGNOSIS — H353133 Nonexudative age-related macular degeneration, bilateral, advanced atrophic without subfoveal involvement: Secondary | ICD-10-CM | POA: Diagnosis not present

## 2017-05-16 DIAGNOSIS — H35371 Puckering of macula, right eye: Secondary | ICD-10-CM | POA: Diagnosis not present

## 2017-05-16 DIAGNOSIS — H353212 Exudative age-related macular degeneration, right eye, with inactive choroidal neovascularization: Secondary | ICD-10-CM | POA: Diagnosis not present

## 2017-05-16 DIAGNOSIS — H35363 Drusen (degenerative) of macula, bilateral: Secondary | ICD-10-CM | POA: Diagnosis not present

## 2017-05-16 DIAGNOSIS — H43813 Vitreous degeneration, bilateral: Secondary | ICD-10-CM | POA: Diagnosis not present

## 2017-06-13 ENCOUNTER — Ambulatory Visit (INDEPENDENT_AMBULATORY_CARE_PROVIDER_SITE_OTHER): Payer: Medicare HMO

## 2017-06-13 ENCOUNTER — Encounter: Payer: Self-pay | Admitting: Podiatry

## 2017-06-13 ENCOUNTER — Ambulatory Visit (INDEPENDENT_AMBULATORY_CARE_PROVIDER_SITE_OTHER): Payer: Medicare HMO | Admitting: Podiatry

## 2017-06-13 DIAGNOSIS — M21371 Foot drop, right foot: Secondary | ICD-10-CM

## 2017-06-13 DIAGNOSIS — M79673 Pain in unspecified foot: Secondary | ICD-10-CM

## 2017-06-13 DIAGNOSIS — M79676 Pain in unspecified toe(s): Secondary | ICD-10-CM | POA: Diagnosis not present

## 2017-06-13 DIAGNOSIS — M21372 Foot drop, left foot: Secondary | ICD-10-CM

## 2017-06-13 DIAGNOSIS — M722 Plantar fascial fibromatosis: Secondary | ICD-10-CM

## 2017-06-13 DIAGNOSIS — B351 Tinea unguium: Secondary | ICD-10-CM | POA: Diagnosis not present

## 2017-06-13 MED ORDER — BETAMETHASONE SOD PHOS & ACET 6 (3-3) MG/ML IJ SUSP: 3.0000 mg | Freq: Once | INTRAMUSCULAR | Status: DC

## 2017-06-13 NOTE — Addendum Note (Signed)
Addended by: Tomi Bamberger on: 06/13/2017 12:29 PM   Modules accepted: Orders

## 2017-06-13 NOTE — Progress Notes (Signed)
Patient ID: Brandon Ray, male   DOB: [birth year], 82 y.o.   MRN: [id number]   Subjective: 82 year old male presents today for multiple complaints. Patient has been seen in the past several years by Dr. Blenda Mounts. Patient states that he developed bilateral lower extremity paresthesia and numbness to vaccine. Patient currently wears bilateral AFOs. Patient walks approximately 1-1/2 miles per day on trails. He complains of aggravating heel pain for the past month. Patient denies any significant injury. Patient also complains of elongated, painful, thickened toenails bilateral. Patient presents today for further treatment and evaluation.  Past Medical History:  Diagnosis Date  . BPH (benign prostatic hypertrophy) 2014  . Coronary artery disease 01/2003   CABG  . Free monoclonal light chain    urine  . Hyperlipidemia   . Peripheral neuropathy 05/2011    follow up with Dr. Tish Frederickson multiple issues  . Pleural effusion 05/2011   from abdominal surgery  . Seizures ([place]) 1950's   last one 1960  . Small bowel obstruction ([place]) 05/2011   due to adhesion  . Weakness 2013-May began   lower extemities     Objective: Physical Exam General: The patient is alert and oriented x3 in no acute distress.  Dermatology: Hyperkeratotic thickened dystrophic nails noted bilateral. Skin is warm, dry and supple bilateral lower extremities. Negative for open lesions or macerations bilateral.   Vascular: Dorsalis Pedis and Posterior Tibial pulses palpable bilateral.  Capillary fill time is immediate to all digits.  Neurological: Epicritic and protective threshold intact bilateral.   Musculoskeletal: Tenderness to palpation at the medial calcaneal tubercale and through the insertion of the plantar fascia of the left foot. All other joints range of motion within normal limits bilateral. Strength 5/5 in all groups bilateral.   Radiographic exam:   Normal osseous mineralization. Joint spaces preserved. No  fracture/dislocation/boney destruction. Calcaneal spur present with mild thickening of plantar fascia left. No other soft tissue abnormalities or radiopaque foreign bodies.   Assessment: 1. Plantar fasciitis left foot 2. Pain due to onychomycosis of toenail bilateral  Plan of Care:  1. Patient evaluated. Xrays reviewed.   2. Injection of 0.5cc Celestone soluspan injected into the left plantar fascia.  3. Appointment with Liliane Channel for custom molded orthotics, and possible new fitting of AFOs 4. Mechanical debridement of nails 1-5 bilaterally performed using a nail nipper without incident or bleeding. Recommend routine pedicures. 5. Return to clinic in 3 months    Edrick Kins, DPM [facility]  Dr. Edrick Kins, DPM    [address], [place] [address]                Office [phone]  Fax [phone]

## 2017-06-13 NOTE — Progress Notes (Signed)
   Subjective:    Patient ID: Brandon Ray, male    DOB: 1935-05-16, 82 y.o.   MRN: [id number]  HPI    Review of Systems  Constitutional: Positive for diaphoresis.  Cardiovascular: Positive for leg swelling.  Endocrine: Positive for cold intolerance.  Musculoskeletal: Positive for arthralgias and gait problem.  Skin:       Change in nails   Neurological: Positive for numbness.  Hematological: Bruises/bleeds easily.  All other systems reviewed and are negative.      Objective:   Physical Exam        Assessment & Plan:

## 2017-06-16 ENCOUNTER — Ambulatory Visit: Payer: Medicare HMO | Admitting: Podiatry

## 2017-06-16 DIAGNOSIS — M21372 Foot drop, left foot: Secondary | ICD-10-CM

## 2017-06-16 DIAGNOSIS — M21371 Foot drop, right foot: Secondary | ICD-10-CM

## 2017-06-16 DIAGNOSIS — M722 Plantar fascial fibromatosis: Secondary | ICD-10-CM

## 2017-06-16 NOTE — Progress Notes (Signed)
Saw patient today for evaluation only for F/O vs bracing.  Patient has bilateral OTS Allard toe off braces wearing with CFO.   Patient is having significant heel pain, even after last visit and injection w/ Dr. Amalia Hailey.    I am recommending another visit with Dr. Amalia Hailey, as well as bilateral Richie type AFO w/ dorsi assist.  This will offer rear foot stability in coronal plane, and dorsi assist in saggital.  Plus we will cast in non weight bearing to accentuate arch support within the brace footbed.   Appointment in one week with both Dr. Amalia Hailey and I for folloup and casting.

## 2017-06-21 ENCOUNTER — Other Ambulatory Visit: Payer: Medicare HMO | Admitting: Orthotics

## 2017-06-22 ENCOUNTER — Encounter: Payer: Self-pay | Admitting: Podiatry

## 2017-06-22 ENCOUNTER — Ambulatory Visit (INDEPENDENT_AMBULATORY_CARE_PROVIDER_SITE_OTHER): Payer: Medicare HMO | Admitting: Podiatry

## 2017-06-22 ENCOUNTER — Other Ambulatory Visit: Payer: Medicare HMO | Admitting: Orthotics

## 2017-06-22 ENCOUNTER — Other Ambulatory Visit: Payer: Self-pay | Admitting: Podiatry

## 2017-06-22 DIAGNOSIS — M722 Plantar fascial fibromatosis: Secondary | ICD-10-CM

## 2017-06-22 DIAGNOSIS — M21372 Foot drop, left foot: Secondary | ICD-10-CM | POA: Diagnosis not present

## 2017-06-22 DIAGNOSIS — M21371 Foot drop, right foot: Secondary | ICD-10-CM | POA: Diagnosis not present

## 2017-06-22 MED ORDER — DICLOFENAC SODIUM 75 MG PO TBEC: 75.0000 mg | DELAYED_RELEASE_TABLET | Freq: Two times a day (BID) | ORAL | 1 refills | Status: DC

## 2017-06-22 NOTE — Progress Notes (Signed)
Patient ID: [name], male   DOB: 1935-07-12, 82 y.o.   MRN: [id number]   Subjective: 81 year old male presents today for follow-up treatment and evaluation regarding plantar fasciitis to the left foot as well as dropfoot deformity bilateral. Patient has been seen in the past several years by Dr. Blenda Mounts. Patient states that the injection only helped for one day. He continues to have left plantar heel pain. Patient also had aopportunity to meet with Brandon Ray, Pedorthist for possible AFOs and molded orthotics. Patient has a chronic history of dropfoot deformity bilateral lower extremities with gait instability.  Past Medical History:  Diagnosis Date  . BPH (benign prostatic hypertrophy) 2014  . Coronary artery disease 01/2003   CABG  . Free monoclonal light chain    urine  . Hyperlipidemia   . Peripheral neuropathy 05/2011    follow up with Dr. Tish Frederickson multiple issues  . Pleural effusion 05/2011   from abdominal surgery  . Seizures ([place]) 1950's   last one 1960  . Small bowel obstruction ([place]) 05/2011   due to adhesion  . Weakness 2013-May began   lower extemities     Objective: Physical Exam General: The patient is alert and oriented x3 in no acute distress.  Dermatology: Hyperkeratotic thickened dystrophic nails noted bilateral. Skin is warm, dry and supple bilateral lower extremities. Negative for open lesions or macerations bilateral.   Vascular: Dorsalis Pedis and Posterior Tibial pulses palpable bilateral.  Capillary fill time is immediate to all digits.  Neurological: Epicritic and protective threshold intact bilateral.   Musculoskeletal: Tenderness to palpation at the medial calcaneal tubercale and through the insertion of the plantar fascia of the left foot. All other joints range of motion within normal limits bilateral. Strength 5/5 in all groups bilateral with exception of the anterior muse group bilateral lower extremities which are weak due to chronic dropfoot  deformity  Assessment: 1. Plantar fasciitis left foot 2. Dropfoot deformity bilateral lower extremities  Plan of Care:  1. Patient evaluated.  2. Today the patient met with Brandon Ray, Pedorthist to discuss custom molded orthotics and possible custom molded AFO braces. Patient's wife is concerned about stability and the patient's current gait instability 3.follow-up with Brandon Ray for custom molded orthotic fitting 4. Return to clinic for follow-up appointment when necessary  Edrick Kins, DPM [facility]  Dr. Edrick Kins, DPM    [address], [place] [address]                Office [phone]  Fax [phone]

## 2017-07-06 ENCOUNTER — Telehealth: Payer: Self-pay

## 2017-07-06 NOTE — Telephone Encounter (Signed)
Patient scheduled for Prolia injection on 08/04/17- he is aware of the cost

## 2017-07-06 NOTE — Telephone Encounter (Signed)
Left vm requesting patient call me back to discuss Prolia- he is due 08/05/17, PA is on file, and owes $260

## 2017-07-07 ENCOUNTER — Ambulatory Visit: Payer: Medicare HMO | Admitting: Orthotics

## 2017-07-07 DIAGNOSIS — M21372 Foot drop, left foot: Secondary | ICD-10-CM

## 2017-07-07 DIAGNOSIS — M21371 Foot drop, right foot: Secondary | ICD-10-CM

## 2017-07-07 DIAGNOSIS — M722 Plantar fascial fibromatosis: Secondary | ICD-10-CM

## 2017-07-07 NOTE — Progress Notes (Signed)
Patient came in to be evaluate for bilateral braces; after discussing options, he would like to try new f/o first; however, he wasn't sure if the y would help with thinning heel fat pad and pain.  I gave him a pair of diabetic inserts and added 1/8" poron in heel to try.  F/u in two weeks.

## 2017-07-18 DIAGNOSIS — R69 Illness, unspecified: Secondary | ICD-10-CM | POA: Diagnosis not present

## 2017-07-21 ENCOUNTER — Other Ambulatory Visit: Payer: Medicare HMO | Admitting: Orthotics

## 2017-07-21 DIAGNOSIS — K56699 Other intestinal obstruction unspecified as to partial versus complete obstruction: Secondary | ICD-10-CM | POA: Diagnosis not present

## 2017-07-22 ENCOUNTER — Telehealth: Payer: Self-pay | Admitting: Cardiology

## 2017-07-22 ENCOUNTER — Telehealth: Payer: Self-pay

## 2017-07-22 NOTE — Telephone Encounter (Signed)
Returned call to patient.He stated he has been having swelling in lower legs and feet for some time.Stated worse the past 2 weeks.Stated he started wearing compression socks which is helping.Appointment scheduled with Dr.Jordan 07/26/17 at 10:40 am.Advised of low salt diet and keep feet elevated when sitting.

## 2017-07-22 NOTE — Telephone Encounter (Signed)
Mr.Mcgilvery is calling because he has a question about his leg and circulation . Please call

## 2017-07-22 NOTE — Telephone Encounter (Signed)
Left vm requesting the patient call me back so that we can schedule nurse visit for him to come in an get Prolia injection- patient has been verified, PA is on file, and he can have inject any day after 08/04/17

## 2017-07-26 ENCOUNTER — Encounter: Payer: Self-pay | Admitting: Cardiology

## 2017-07-26 ENCOUNTER — Ambulatory Visit: Payer: Medicare HMO | Admitting: Cardiology

## 2017-07-26 VITALS — BP 130/70 | HR 67 | Ht 73.0 in | Wt 202.0 lb

## 2017-07-26 DIAGNOSIS — R600 Localized edema: Secondary | ICD-10-CM | POA: Diagnosis not present

## 2017-07-26 DIAGNOSIS — I25810 Atherosclerosis of coronary artery bypass graft(s) without angina pectoris: Secondary | ICD-10-CM

## 2017-07-26 DIAGNOSIS — E7800 Pure hypercholesterolemia, unspecified: Secondary | ICD-10-CM | POA: Diagnosis not present

## 2017-07-26 LAB — HEPATIC FUNCTION PANEL
ALBUMIN: 4.6 g/dL (ref 3.5–4.7)
ALK PHOS: 80 IU/L (ref 39–117)
ALT: 25 IU/L (ref 0–44)
AST: 23 IU/L (ref 0–40)
BILIRUBIN TOTAL: 0.6 mg/dL (ref 0.0–1.2)
Bilirubin, Direct: 0.18 mg/dL (ref 0.00–0.40)
Total Protein: 6.6 g/dL (ref 6.0–8.5)

## 2017-07-26 LAB — BASIC METABOLIC PANEL
BUN / CREAT RATIO: 11 (ref 10–24)
BUN: 10 mg/dL (ref 8–27)
CALCIUM: 9.3 mg/dL (ref 8.6–10.2)
CHLORIDE: 97 mmol/L (ref 96–106)
CO2: 26 mmol/L (ref 20–29)
Creatinine, Ser: 0.89 mg/dL (ref 0.76–1.27)
GFR calc non Af Amer: 81 mL/min/{1.73_m2} (ref >59)
GFR, EST AFRICAN AMERICAN: 93 mL/min/{1.73_m2} (ref >59)
Glucose: 88 mg/dL (ref 65–99)
POTASSIUM: 4.8 mmol/L (ref 3.5–5.2)
SODIUM: 138 mmol/L (ref 134–144)

## 2017-07-26 LAB — LIPID PANEL W/O CHOL/HDL RATIO
Cholesterol, Total: 138 mg/dL (ref 100–199)
HDL: 70 mg/dL (ref >39)
LDL Calculated: 55 mg/dL (ref 0–99)
Triglycerides: 66 mg/dL (ref 0–149)
VLDL Cholesterol Cal: 13 mg/dL (ref 5–40)

## 2017-07-26 NOTE — Patient Instructions (Signed)
Continue support hose and elevation of feet when possible.  Avoid salt.   Continue your other therapy

## 2017-07-26 NOTE — Progress Notes (Signed)
[name] Date of Birth: 09-03-1935   History of Present Illness: Mr. Carchi is seen today for followup. He has a history of coronary disease and is status post CABG in 2004 after a NSTEMI. He had severe 3 vessel disease and normal LV function. 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 This has significantly limited his activity.  He really denies any significant cardiac complaints. He denies chest pain, shortness of breath, or palpitations. He has had some edema over the past year but this became worse over the past week. Since he started using compression hose this has improved. No orthopnea or PND. He did have SBO in January that resolved with conservative measures.  Current Outpatient Medications on File Prior to Visit  Medication Sig Dispense Refill  . aspirin 81 MG tablet Take 81 mg by mouth daily.    Marland Kitchen atorvastatin (LIPITOR) 20 MG tablet Take 1 tablet (20 mg total) by mouth daily at 6 PM. 90 tablet 3  . calcium-vitamin D (OSCAL WITH D) 500-200 MG-UNIT per tablet Take 1 tablet by mouth daily.      . Cholecalciferol 2000 UNITS TBDP Take 5,000 Units by mouth daily.     . Multiple Vitamins-Minerals (PRESERVISION AREDS 2 PO) Take 1 tablet by mouth daily.    . Omega-3 Fatty Acids (FISH OIL) 1200 MG CAPS Take 1,200 mg by mouth daily.    Marland Kitchen PHENobarbital (LUMINAL) 97.2 MG tablet Take 97.2 mg by mouth daily.      . polyethylene glycol (MIRALAX / GLYCOLAX) packet Take 17 g by mouth daily.    . pregabalin (LYRICA) 75 MG capsule TAKE ONE CAPSULE BY MOUTH 3 TIMES A DAY    . tamsulosin (FLOMAX) 0.4 MG CAPS capsule Take 0.4 mg by mouth daily.    Marland Kitchen testosterone (ANDROGEL) 50 MG/5GM (1%) GEL APPLY 5GM(1 PACKET) TO SKIN EVERY DAY 450 g 0   Current Facility-Administered Medications on File Prior to Visit  Medication Dose Route Frequency Provider Last Rate Last Dose  . betamethasone acetate-betamethasone sodium phosphate (CELESTONE) injection 3 mg  3 mg Intramuscular Once  Edrick Kins, DPM        Allergies  Allergen Reactions  . Amoxicillin Rash and Anaphylaxis    Upper torso only.  . Boostrix [Tetanus-Diphth-Acell Pertussis] Other (See Comments)    Neuropathy symptoms  . Other Other (See Comments)    Flu shot-neuropathy symptoms    Past Medical History:  Diagnosis Date  . BPH (benign prostatic hypertrophy) 2014  . Coronary artery disease 01/2003   CABG  . Free monoclonal light chain    urine  . Hyperlipidemia   . Peripheral neuropathy 05/2011    follow up with Dr. Tish Frederickson multiple issues  . Pleural effusion 05/2011   from abdominal surgery  . Seizures ([place]) 1950's   last one 1960  . Small bowel obstruction ([place]) 05/2011   due to adhesion  . Weakness 2013-May began   lower extemities    Past Surgical History:  Procedure Laterality Date  . ABDOMINAL SURGERY  05/2011   bowel obstruction  . [place]   left  . CARDIAC CATHETERIZATION  01/27/2003   NORMAL LEFT VENTRICULAR SIZE WITH MILD FOCAL LATERAL WALL HYPOKINESIA. EF 60%  . CORONARY ARTERY BYPASS GRAFT  2004   LIMA GRAFT TO THE LAD, SAPHENOUS VEIN GRAFT TO THE DIAGONAL, SAPHENOUS VEIN GRAFT TO THE FIRST OM, SAPHENOUS VEIN GRAFT TO THE PDA  . HAND SURGERY  2000   left  . TRANSURETHRAL RESECTION OF PROSTATE  1992    Social History   Tobacco Use  Smoking Status Former Smoker  . Packs/day: 1.00  . Years: 5.00  . Pack years: 5.00  . Types: Cigarettes  . Last attempt to quit: 07/01/1965  . Years since quitting: 52.1  Smokeless Tobacco Never Used    Social History   Substance and Sexual Activity  Alcohol Use No    Family History  Problem Relation Age of Onset  . Asthma Mother   . Heart disease Mother 42  . Coronary artery disease Father   . Heart disease Father   . Cancer Sister 6       breast    Review of Systems: As noted in history of present illness.  All other systems were reviewed and are negative.  Physical Exam: BP 130/70   Pulse 67    Ht $R'6\' 1"'mf$  (1.854 m)   Wt 202 lb (91.6 kg)   BMI 26.65 kg/m  GENERAL:  Well appearing WM in NAD HEENT:  PERRL, EOMI, sclera are clear. Oropharynx is clear. NECK:  No jugular venous distention, carotid upstroke brisk and symmetric, no bruits, no thyromegaly or adenopathy LUNGS:  Clear to auscultation bilaterally CHEST:  Unremarkable HEART:  RRR,  PMI not displaced or sustained,S1 and S2 within normal limits, no S3, no S4: no clicks, no rubs, no murmurs ABD:  Soft, nontender. BS +, no masses or bruits. No hepatomegaly, no splenomegaly EXT:  2 + pulses throughout, he is wearing leg braces bilaterally, very mild ankle edema- support hose in place, no cyanosis no clubbing SKIN:  Warm and dry.  No rashes NEURO:  Alert and oriented x 3. Cranial nerves II through XII intact. PSYCH:  Cognitively intact    LABORATORY DATA: Lab Results  Component Value Date   WBC 4.5 10/09/2016   HGB 13.8 10/09/2016   HCT 41.3 10/09/2016   PLT 182 10/09/2016   GLUCOSE 94 10/09/2016   CHOL 134 09/06/2016   TRIG 76 09/06/2016   HDL 56 09/06/2016   LDLCALC 63 09/06/2016   ALT 22 10/07/2016   AST 23 10/07/2016   NA 139 10/09/2016   K 4.0 10/09/2016   CL 107 10/09/2016   CREATININE 0.72 10/09/2016   BUN 5 (L) 10/09/2016   CO2 26 10/09/2016   TSH 2.41 10/20/2015   PSA 0.20 01/31/2017   INR 1.05 06/07/2012   HGBA1C 6.1 09/28/2012   Labs from [place] 10/15/15: Normal CBC and CMET.  Dated 03/31/17: normal CMET and CBC.   Ecg today shows NSR with  RBBB. LAD. No change.  I have personally reviewed and interpreted this study.   Assessment / Plan: 1. Coronary disease status post CABG in 2004. His last nuclear stress test in May of 2011 was normal. We will continue with his medical management and risk factor modification. He is asymptomatic.   2. Hyperlipidemia. He remains on statin therapy. We will arrange for fasting lab work today.   3. PolyNeuropathy.  4. Leg edema. I think this is related to the fact  that he wears braces and his legs are dependent much of the day. He is also not walking much. Continue conservative therapy with support hose, elevation when possible, and sodium restriction.  I will follow up in one year.

## 2017-07-27 DIAGNOSIS — S0101XA Laceration without foreign body of scalp, initial encounter: Secondary | ICD-10-CM | POA: Diagnosis not present

## 2017-08-01 ENCOUNTER — Other Ambulatory Visit: Payer: Self-pay | Admitting: Internal Medicine

## 2017-08-04 ENCOUNTER — Ambulatory Visit (INDEPENDENT_AMBULATORY_CARE_PROVIDER_SITE_OTHER): Payer: Medicare HMO | Admitting: Internal Medicine

## 2017-08-04 DIAGNOSIS — M810 Age-related osteoporosis without current pathological fracture: Secondary | ICD-10-CM

## 2017-08-04 MED ORDER — DENOSUMAB 60 MG/ML ~~LOC~~ SOLN
60.0000 mg | Freq: Once | SUBCUTANEOUS | Status: AC
  Administered 2017-08-04: 60 mg via SUBCUTANEOUS

## 2017-08-04 NOTE — Progress Notes (Signed)
Patient received Prolia injection with no difficulty.    I supervised the injection. Philemon Kingdom, MD PhD [facility]

## 2017-08-07 ENCOUNTER — Other Ambulatory Visit: Payer: Self-pay | Admitting: Internal Medicine

## 2017-08-08 ENCOUNTER — Telehealth: Payer: Self-pay | Admitting: Internal Medicine

## 2017-08-08 ENCOUNTER — Other Ambulatory Visit: Payer: Self-pay

## 2017-08-08 MED ORDER — TESTOSTERONE 50 MG/5GM (1%) TD GEL: TRANSDERMAL | 2 refills | Status: DC

## 2017-08-08 NOTE — Telephone Encounter (Signed)
LMOM for pt to CB to let us know where he prefers [place] to send his androgel RX.

## 2017-08-09 NOTE — Telephone Encounter (Signed)
error 

## 2017-08-19 ENCOUNTER — Ambulatory Visit: Payer: Medicare HMO | Admitting: Cardiology

## 2017-08-24 ENCOUNTER — Ambulatory Visit: Payer: Medicare HMO | Admitting: Orthotics

## 2017-08-24 DIAGNOSIS — M722 Plantar fascial fibromatosis: Secondary | ICD-10-CM

## 2017-08-24 DIAGNOSIS — M21371 Foot drop, right foot: Secondary | ICD-10-CM

## 2017-08-24 DIAGNOSIS — M21372 Foot drop, left foot: Secondary | ICD-10-CM

## 2017-08-24 NOTE — Progress Notes (Signed)
Patient presents today w/ plantar fasciitis and heel pain.  Per Dr Amalia Hailey, F/O needed to address pain.  Patient has Toe Off OTS bilateral AFO's, which seem to be doing fine with his foot drop.  Plan on Brandon Ray fab F/o with arch support and heel punch/cushion.

## 2017-08-29 ENCOUNTER — Other Ambulatory Visit: Payer: Self-pay | Admitting: Cardiology

## 2017-09-27 ENCOUNTER — Ambulatory Visit (INDEPENDENT_AMBULATORY_CARE_PROVIDER_SITE_OTHER): Payer: Self-pay | Admitting: Orthotics

## 2017-09-27 DIAGNOSIS — M21371 Foot drop, right foot: Secondary | ICD-10-CM

## 2017-09-27 DIAGNOSIS — M722 Plantar fascial fibromatosis: Secondary | ICD-10-CM

## 2017-09-27 DIAGNOSIS — M21372 Foot drop, left foot: Principal | ICD-10-CM

## 2017-09-28 NOTE — Progress Notes (Signed)
PaPatient came in today to pick up custom made foot orthotics.  The goals were accomplished and the patient reported no dissatisfaction with said orthotics.  Patient was advised of breakin period and how to report any issues.tient picked up foot orthotics  300.00 self pay..ABN signed.

## 2017-10-12 DIAGNOSIS — M79672 Pain in left foot: Secondary | ICD-10-CM | POA: Diagnosis not present

## 2017-10-12 DIAGNOSIS — M79671 Pain in right foot: Secondary | ICD-10-CM | POA: Diagnosis not present

## 2017-10-12 DIAGNOSIS — M79641 Pain in right hand: Secondary | ICD-10-CM | POA: Diagnosis not present

## 2017-10-12 DIAGNOSIS — G6289 Other specified polyneuropathies: Secondary | ICD-10-CM | POA: Diagnosis not present

## 2017-10-12 DIAGNOSIS — M79642 Pain in left hand: Secondary | ICD-10-CM | POA: Diagnosis not present

## 2017-10-12 DIAGNOSIS — D472 Monoclonal gammopathy: Secondary | ICD-10-CM | POA: Diagnosis not present

## 2017-10-17 ENCOUNTER — Telehealth: Payer: Self-pay | Admitting: Internal Medicine

## 2017-10-17 NOTE — Telephone Encounter (Signed)
Pt is aware it was a reminder call for his appt that I did not call him

## 2017-10-17 NOTE — Telephone Encounter (Signed)
Returning your call. °

## 2017-10-19 ENCOUNTER — Encounter: Payer: Self-pay | Admitting: Internal Medicine

## 2017-10-19 ENCOUNTER — Ambulatory Visit: Payer: Medicare HMO | Admitting: Internal Medicine

## 2017-10-19 VITALS — BP 140/70 | HR 84 | Ht 73.0 in | Wt 204.6 lb

## 2017-10-19 DIAGNOSIS — E291 Testicular hypofunction: Secondary | ICD-10-CM | POA: Diagnosis not present

## 2017-10-19 DIAGNOSIS — M810 Age-related osteoporosis without current pathological fracture: Secondary | ICD-10-CM

## 2017-10-19 DIAGNOSIS — R61 Generalized hyperhidrosis: Secondary | ICD-10-CM | POA: Diagnosis not present

## 2017-10-19 DIAGNOSIS — R7989 Other specified abnormal findings of blood chemistry: Secondary | ICD-10-CM

## 2017-10-19 DIAGNOSIS — E538 Deficiency of other specified B group vitamins: Secondary | ICD-10-CM

## 2017-10-19 LAB — VITAMIN B12: VITAMIN B 12: 1309 pg/mL — AB (ref 211–911)

## 2017-10-19 LAB — VITAMIN D 25 HYDROXY (VIT D DEFICIENCY, FRACTURES): VITD: 62.54 ng/mL (ref 30.00–100.00)

## 2017-10-19 LAB — TSH: TSH: 2.65 u[IU]/mL (ref 0.35–4.50)

## 2017-10-19 LAB — PSA, MEDICARE: PSA: 0.47 ng/ml (ref 0.10–4.00)

## 2017-10-19 NOTE — Progress Notes (Signed)
Subjective:     Patient ID: Brandon Ray, male   DOB: Jun 16, 1935, 83 y.o.   MRN: [id number]  HPI Brandon Ray is a very pleasant 83 y.o. man with h/o severe polyneuropathy and monoclonal gammopathy, returning for f/u for primary hypogonadism, osteoporosis + h/o sacral fracture, low B12. Last visit 1 year ago.  He c/o sweating.  Reviewed and addended history: He continues to have severe neuropathy. He also still has discomfort in the mid thoracic region, pelvic region and his hands.  He was previously investigated for POEMS sd. - prev. Seen by Dr Lamonte Sakai, then had an evaluation by Dr. Karie Kirks. Tuchman (Heme/Onc at [facility]) for this, but it was concluded that he actually has MGUS and will be just followed for it. He also had a fat pad biopsy to rule out amyloidosis and this was negative.   Please see my previous notes regarding patient's past medical history. He does have polyneuropathy, monoclonal gammopathy, gynecomastia and primary hypogonadism, but no organomegaly or significant skin changes. He also sees Dr. Tessa Lerner in pain clinic,  Dr Trinna Post (neurology) at the [facility]. Dr. Rushie Goltz is his local neurologist ([facility]). 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   He Was on Neurontin high doses >> not helping >> sees Pain Clinic at [facility] >> tried Cymbalta + Neurontin >> did not help >> now on Lyrica, which finally gives him some relief  Primary hypogonadism: - We initially started him on testosterone gel 5 g daily, then we increased to 7.5 g daily.  He could not tolerate this dose well: Increase libido, pelvic tension, breast tension.  He then went back to the 5 g daily dose then 5 g 5 out of 7 days. - At last visit,  PSA returned elevated, so we stopped testosterone for a period of time, however, a subsequent PSA was very low, so we restarted the same dose of testosterone  Reviewed pertinent labs: Component     Latest Ref Rng & Units 10/20/2015 10/19/2016  Testosterone   264 - 916 ng/dL 251 862  Sex Horm Binding Glob, Serum     19.3 - 76.4 nmol/L 55 71.5  Testosterone Free     6.6 - 18.1 pg/mL 34.6 (L) 10.3  Testosterone-% Free     1.6 - 2.9 % 1.4 (L)    Lab Results  Component Value Date   PSA 0.20 01/31/2017   PSA 5.89 (H) 10/19/2016   PSA 0.30 10/20/2015   PSA 0.35 04/16/2015   PSA 0.48 03/13/2014   PSA 0.15 02/09/2013   Last Hb/HT normal: 10/12/2017: 16/47.8 Lab Results  Component Value Date   WBC 4.5 10/09/2016   HGB 13.8 10/09/2016   HCT 41.3 10/09/2016   MCV 94.1 10/09/2016   PLT 182 10/09/2016  10/15/2015: Hb 15.8/HT 47.8 04/15/2015: Hb 16.4 (13.7-17.3)/HT 50.2 (39-49)  Last DRE: 11/2016.  He is seeing his urologist on a regular basis.  Patient also has gynecomastia, with normal mammograms.  This is likely secondary to his primary hypogonadism.  It has not improved after starting testosterone.  Prolactin level was normal.  He also has dysesthesia at the level of his nipples, probably related to his neuropathy.  This has improved on Lyrica.  Patient has been on high-dose steroids for his neuropathy hy (started at 60 mg in 07/2012 >> stopped prednisone around 09/2013 09/2013).  Patient with osteopenic BMD, however with a sacral fracture and also a right big toe fracture (04/2013),  likely secondary to steroid  treatment +/- hypogonadism.   No fracture since last visit.  Reviewed together her latest bone density reports and images: Latest T-scores were improved:  DXA 11/28/2016: L1-L4 T score of +0.4 (+3.3%*) and right femoral neck -1.3, left femoral neck -0.8 (mean femoral neck: +3.0%)  DXA 11/10/2014: L1-L4 T score of +0.1 and right femoral neck -1.4, left femoral neck -1.2 DXA 11/06/2012: L1-L4 T score of -0.9 and dual femur T score of -1.1  We started Prolia and he had 6 injections, last 07/2017.  No side effects from Prolia, other than possible seating.  No dental work in progress.  No thigh pain.  He continues vitamin D 5000 units  + calcium 600 mg daily.  Last vitamin D was normal. Lab Results  Component Value Date   VD25OH 58.28 10/19/2016   VD25OH 54.39 10/20/2015   VD25OH 64.27 04/16/2015   VD25OH 56.34 10/04/2014   VD25OH 67 09/14/2013   VD25OH 55 09/28/2012   We checked a vitamin B12 level at last visit to investigate for possible reversible causes for his neuropathy.  This returned low: Lab Results  Component Value Date   VITAMINB12 >1500 (H) 01/31/2017   VITAMINB12 225 10/19/2016   We started vitamin B12 p.o. 5000 mcg daily, and then decrease to every other day in 01/2017 after B12 level returned elevated.  He continues on this dose now.  He continues on phenobarbital since 1961.   Review of Systems -see HPI also Constitutional: + weight gain/no weight loss, + fatigue, no subjective hyperthermia, + subjective hypothermia Eyes: no blurry vision, no xerophthalmia ENT: no sore throat, no nodules palpated in throat, no dysphagia, no odynophagia, no hoarseness Cardiovascular: no CP/no SOB/no palpitations/+ leg swelling Respiratory: no cough/no SOB/no wheezing Gastrointestinal: no N/no V/no D/no C/no acid reflux Musculoskeletal: no muscle aches/+ joint aches Skin: no rashes, no hair loss Neurological: no tremors/+ numbness/+ tingling/no dizziness + sweating  I reviewed pt's medications, allergies, PMH, social hx, family hx, and changes were documented in the history of present illness. Otherwise, unchanged from my initial visit note. Lyrica increased from 75 TID to 100 mg TID.  Objective:   Physical Exam BP 140/70   Pulse 84   Ht $R'6\' 1"'Fl$  (1.854 m)   Wt 204 lb 9.6 oz (92.8 kg)   SpO2 95%   BMI 26.99 kg/m  Body mass index is 26.99 kg/m. Wt Readings from Last 3 Encounters:  10/19/17 204 lb 9.6 oz (92.8 kg)  07/26/17 202 lb (91.6 kg)  10/19/16 194 lb (88 kg)   Constitutional: overweight, in NAD, + walks with cane Eyes: PERRLA, EOMI, no exophthalmos ENT: moist mucous membranes, no thyromegaly, no  cervical lymphadenopathy Cardiovascular: RRR, No MRG, + bilateral pitting edema Respiratory: CTA B Gastrointestinal: abdomen soft, NT, ND, BS+ Musculoskeletal: no deformities, strength intact in all 4 Skin: moist, warm, no rashes Neurological: no tremor with outstretched hands, DTR normal in all 4  Assessment:     1. Primary hypogonadism - + gynecomastia bilaterally - per mammography + testicular atrophy - He is on testosterone gel >> a sensation of pressure in abdomen, but this did not subside after stopping testosterone gel for 1 mo after his surgery for Transurethral resection of prostate residual growth in 12/22/2012.   2. Osteoporosis - h/o sacral fracture, right big toe fracture (04/2013), spine X-ray: no vb fx - Off steroids now - on adequate Ca+vit D, vit D level normal at last visit. Will recheck today - On Prolia, no side effects  3. Polyneuropathy -  unlikely POEMS syndrome per evaluation by Hem/Onc at [facility] - amyloidosis ruled out by negative fat pad biopsy - managed by Dr. Burt Ek at [facility] and Dr. Brynda Peon at [facility]   4. Long-term use of steroids - previously on prednisone 60 mg starting at the end of 2013 - Was able to taper >> now off since 09/2013  5. MGUS - monoclonal gammopathy - now managed by Dr.Tuchman at [facility], previously Dr. Gaylyn Rong  6.  Increased sweating    Plan:     1. Primary hypogonadism - Continues AndroGel 1% 5 g 5/7 days.  He sometimes misses doses when his grandchildren are visiting - He continues to have pelvic pressure (unclear if this is related to testosterone treatment, since he held testosterone in the past and still experienced the pressure - He is seeing Dr. Mena Goes with urology and is up-to-date with his DREs -last 11/2016 - Reviewed previous testosterone level, PSA and CBC -all normal.  He had a high PSA at last visit, but the repeated one was much lower, so I suspect an erroneous lab results in 09/2016. - We will recheck a  testosterone level today along with a PSA and a CBC  2. Osteoporosis  - He has osteopenia on DXA scan but has a history of sacral fracture >> dx of osteoporosis - He has been off prednisone and Dilantin, and is currently on testosterone replacement and also Prolia.  No recent fractures, no side effects from Prolia.  He already had 6 injections, last 07/2017. - Reviewed together her latest bone mineral density from 11/28/2016: Scores slightly better - We will plan to continue at least 6 years of Prolia, then switched to p.o. or IV bisphosphonates for at least a year. - Continue vitamin D 5000 units daily.  We will check another level now. - continue calcium 600 mg daily -chewable - Return to clinic in 1 year  3.  Low vitamin B12 - At last visit, we checked a B12 vitamin to investigate for possible causes for neuropathy >> returned low >> started 5000 mcg B12 daily and we changed to every other day 01/2017.  B6 vitamin was normal. - We will check a B12 level today  4. Increased sweating - possibly 2/2 Lyrica, but also possible 2/2 weight gain (gained 10 lbs since last visit) - will check a TSH  Orders Placed This Encounter  Procedures  . Vitamin B12  . Testosterone Free with SHBG  . VITAMIN D 25 Hydroxy (Vit-D Deficiency, Fractures)  . PSA, Medicare ([place])  . TSH   CC: Drs. Cresenciano Lick  Office Visit on 10/19/2017  Component Date Value Ref Range Status  . Vitamin B-12 10/19/2017 1,309* 211 - 911 pg/mL Final  . Testosterone, Serum (Total) 10/19/2017 674  ng/dL Final   Comment: Reference Range: Adult Males >18 years    264 - 916 This [facility] LC/MS-MS method is currently certified by the [facility] Hormone Standardization Program (HoST).  Adult male reference interval is based on a population of healthy nonobese males (BMI <30) between 35 and 27 years old. Mardee Postin 1139,[id number] PMID: [id number].   . % Free Testosterone 10/19/2017 1.1  % Final   Comment: Reference  Range: Adult Males: 1.5 - 3.2   . Free Testosterone, S 10/19/2017 74  pg/mL Final   Comment: Reference Range: Adult Males: 52 - 280   . Sex Hormone Binding Globulin 10/19/2017 80.9* nmol/L Final   Comment: Reference Range: >49y: 19.3 - 76.4   . VITD  10/19/2017 62.54  30.00 - 100.00 ng/mL Final  . PSA 10/19/2017 0.47  0.10 - 4.00 ng/ml Final   Test performed using Access Hybritech PSA Assay, a parmagnetic partical, chemiluminecent immunoassay.  Marland Kitchen TSH 10/19/2017 2.65  0.35 - 4.50 uIU/mL Final   Msg sent: Dear Brandon Ray, The vitamin D and vitamin B12 are normal.  The PSA is higher, at 0.4.  I would not stop testosterone for now, despite increase, until you see the urologist. The testosterone level is still not back yet.  I will let you know as soon as it returns. Your thyroid test is normal. Sincerely, Philemon Kingdom MD  *Testosterone level returned normal.  Philemon Kingdom, MD PhD [facility]

## 2017-10-19 NOTE — Patient Instructions (Addendum)
Please continue Testosterone gel 5 g 5/7 days.  Continue B12 vitamin 5000 mcg every other day and vitamin D 5000 units every day.  Continue Prolia.  Please stop at the lab.  Please come back for a follow-up appointment in 1 year.

## 2017-10-24 LAB — TESTOSTERONE, FREE AND TOTAL (INCLUDES SHBG)-(MALES)
% Free Testosterone: 1.1 %
FREE TESTOSTERONE, S: 74 pg/mL
Sex Hormone Binding Globulin: 80.9 nmol/L — ABNORMAL HIGH
Testosterone, Serum (Total): 674 ng/dL

## 2017-11-15 DIAGNOSIS — H43813 Vitreous degeneration, bilateral: Secondary | ICD-10-CM | POA: Diagnosis not present

## 2017-11-15 DIAGNOSIS — H353133 Nonexudative age-related macular degeneration, bilateral, advanced atrophic without subfoveal involvement: Secondary | ICD-10-CM | POA: Diagnosis not present

## 2017-11-15 DIAGNOSIS — H353212 Exudative age-related macular degeneration, right eye, with inactive choroidal neovascularization: Secondary | ICD-10-CM | POA: Diagnosis not present

## 2017-11-15 DIAGNOSIS — H35371 Puckering of macula, right eye: Secondary | ICD-10-CM | POA: Diagnosis not present

## 2017-11-18 DIAGNOSIS — R69 Illness, unspecified: Secondary | ICD-10-CM | POA: Diagnosis not present

## 2017-11-22 DIAGNOSIS — D472 Monoclonal gammopathy: Secondary | ICD-10-CM | POA: Diagnosis not present

## 2017-11-22 DIAGNOSIS — R262 Difficulty in walking, not elsewhere classified: Secondary | ICD-10-CM | POA: Diagnosis not present

## 2017-11-22 DIAGNOSIS — G610 Guillain-Barre syndrome: Secondary | ICD-10-CM | POA: Diagnosis not present

## 2017-11-22 DIAGNOSIS — Z8669 Personal history of other diseases of the nervous system and sense organs: Secondary | ICD-10-CM | POA: Diagnosis not present

## 2017-11-22 DIAGNOSIS — G629 Polyneuropathy, unspecified: Secondary | ICD-10-CM | POA: Diagnosis not present

## 2017-11-24 DIAGNOSIS — M25561 Pain in right knee: Secondary | ICD-10-CM | POA: Diagnosis not present

## 2017-11-24 DIAGNOSIS — M25562 Pain in left knee: Secondary | ICD-10-CM | POA: Diagnosis not present

## 2017-11-24 DIAGNOSIS — M1711 Unilateral primary osteoarthritis, right knee: Secondary | ICD-10-CM | POA: Diagnosis not present

## 2017-12-14 DIAGNOSIS — R3914 Feeling of incomplete bladder emptying: Secondary | ICD-10-CM | POA: Diagnosis not present

## 2017-12-14 DIAGNOSIS — N401 Enlarged prostate with lower urinary tract symptoms: Secondary | ICD-10-CM | POA: Diagnosis not present

## 2017-12-15 DIAGNOSIS — Z8719 Personal history of other diseases of the digestive system: Secondary | ICD-10-CM | POA: Diagnosis not present

## 2017-12-15 DIAGNOSIS — K5900 Constipation, unspecified: Secondary | ICD-10-CM | POA: Diagnosis not present

## 2017-12-16 ENCOUNTER — Telehealth: Payer: Self-pay

## 2017-12-16 NOTE — Telephone Encounter (Signed)
   [facility] Pre-operative Risk Assessment    Request for surgical clearance:  1. What type of surgery is being performed? TKA-Medial and Lateral w/wo patella resurfacing  2. When is this surgery scheduled? pending  3. What type of clearance is required (medical clearance vs. Pharmacy clearance to hold med vs. Both)? Medical  4. Are there any medications that need to be held prior to surgery and how long? Aspirin  5. Practice name and name of physician performing surgery? [facility]  6. What is your office phone and fax number? Phone # [phone]   Fax # [phone]  7. Anesthesia type (None, local, MAC, general) ? unknown   Kathyrn Lass 12/16/2017, 1:40 PM  _________________________________________________________________   (provider comments below)

## 2017-12-18 NOTE — H&P (Signed)
TOTAL KNEE ADMISSION H&P  Patient is being admitted for right total knee arthroplasty.  Subjective:  Chief Complaint:    Right knee primary OA / pain  HPI: BROOKS KINNAN, 83 y.o. male, has a history of pain and functional disability in the right knee due to arthritis and has failed non-surgical conservative treatments for greater than 12 weeks to include NSAID's and/or analgesics, corticosteriod injections, use of assistive devices and activity modification.  Onset of symptoms was gradual, starting 1.5+ years ago with gradually worsening course since that time. The patient noted prior procedures on the knee to include  arthroscopy on the right knee(s).  Patient currently rates pain in the right knee(s) at 8 out of 10 with activity. Patient has night pain, worsening of pain with activity and weight bearing, pain that interferes with activities of daily living, pain with passive range of motion, crepitus and joint swelling.  Patient has evidence of periarticular osteophytes and joint space narrowing by imaging studies. There is no active infection.   Risks, benefits and expectations were discussed with the patient.  Risks including but not limited to the risk of anesthesia, blood clots, nerve damage, blood vessel damage, failure of the prosthesis, infection and up to and including death.  Patient understand the risks, benefits and expectations and wishes to proceed with surgery.   PCP: Hulan Fess, MD  D/C Plans:       Home   Post-op Meds:       No Rx given   Tranexamic Acid:      To be given - IV   Decadron:      Is to be given  FYI:      ASA  Norco  DME:   Pt already has equipment   PT:   OPPT Rx given   Patient Active Problem List   Diagnosis Date Noted  . Low serum vitamin B12 10/19/2017  . SBO (small bowel obstruction) ([place]) 10/06/2016  . Diarrhea 10/06/2016  . AKI (acute kidney injury) ([place]) 10/06/2016  . Osteoporosis 03/13/2014  . Encounter for long-term (current) use of  steroids 03/16/2013  . Primary male hypogonadism 10/19/2012  . Endocrinopathy 09/20/2012  . Gynecomastia, male 09/20/2012  . Fracture 09/20/2012  . Free monoclonal light chain   . Monoclonal (M) protein disease, multiple 'M' protein   . CIDP (chronic inflammatory demyelinating polyneuropathy) ([place]) 06/21/2012  . Constipation 06/20/2012  . Chronic inflammatory demyelinating polyneuropathy ([place]) 06/12/2012  . Acute urinary retention 06/12/2012  . Neurogenic bladder 06/12/2012  . Right sided weakness 06/07/2012  . Seizure disorder ([place]) 06/07/2012  . Pleural effusion on right 07/22/2011  . Tachycardia 07/22/2011  . Small bowel obstruction, s/p lap LOA, c/b post op abscess 07/02/2011  . Coronary artery disease   . Hyperlipidemia    Past Medical History:  Diagnosis Date  . BPH (benign prostatic hypertrophy) 2014  . Coronary artery disease 01/2003   CABG  . Free monoclonal light chain    urine  . Hyperlipidemia   . Peripheral neuropathy 05/2011    follow up with Dr. Tish Frederickson multiple issues  . Pleural effusion 05/2011   from abdominal surgery  . Seizures ([place]) 1950's   last one 1960  . Small bowel obstruction ([place]) 05/2011   due to adhesion  . Weakness 2013-May began   lower extemities    Past Surgical History:  Procedure Laterality Date  . ABDOMINAL SURGERY  05/2011   bowel obstruction  . [place]   left  .  CARDIAC CATHETERIZATION  01/27/2003   NORMAL LEFT VENTRICULAR SIZE WITH MILD FOCAL LATERAL WALL HYPOKINESIA. EF 60%  . CORONARY ARTERY BYPASS GRAFT  2004   LIMA GRAFT TO THE LAD, SAPHENOUS VEIN GRAFT TO THE DIAGONAL, SAPHENOUS VEIN GRAFT TO THE FIRST OM, SAPHENOUS VEIN GRAFT TO THE PDA  . HAND SURGERY  2000   left  . TRANSURETHRAL RESECTION OF PROSTATE  1992  . TRANSURETHRAL RESECTION OF PROSTATE N/A 12/22/2012   Procedure: TRANSURETHRAL RESECTION OF THE PROSTATE WITH GYRUS (TURP);  Surgeon: Fredricka Bonine, MD;  [facility]: [facility];  Service: Urology;  Laterality: N/A;    Current Facility-Administered Medications  Medication Dose Route Frequency Provider Last Rate Last Dose  . betamethasone acetate-betamethasone sodium phosphate (CELESTONE) injection 3 mg  3 mg Intramuscular Once Edrick Kins, DPM       Current Outpatient Medications  Medication Sig Dispense Refill Last Dose  . aspirin 81 MG tablet Take 81 mg by mouth daily.   Taking  . atorvastatin (LIPITOR) 20 MG tablet TAKE 1 TABLET (20 MG TOTAL) BY MOUTH DAILY AT 6 PM. 90 tablet 3 Taking  . calcium-vitamin D (OSCAL WITH D) 500-200 MG-UNIT per tablet Take 1 tablet by mouth daily.     Taking  . Cholecalciferol 2000 UNITS TBDP Take 5,000 Units by mouth daily.    Taking  . LYRICA 100 MG capsule TAKE 1 CAPSULE BY MOUTH THREE TIMES A DAY  2 Taking  . Multiple Vitamins-Minerals (PRESERVISION AREDS 2 PO) Take 1 tablet by mouth daily.   Taking  . Omega-3 Fatty Acids (FISH OIL) 1200 MG CAPS Take 1,200 mg by mouth daily.   Taking  . PHENobarbital (LUMINAL) 97.2 MG tablet Take 97.2 mg by mouth daily.     Taking  . polyethylene glycol (MIRALAX / GLYCOLAX) packet Take 17 g by mouth daily.   Taking  . tamsulosin (FLOMAX) 0.4 MG CAPS capsule Take 0.4 mg by mouth daily.   Taking  . testosterone (ANDROGEL) 50 MG/5GM (1%) GEL APPLY 1 PACKET TO SKIN EVERY DAY 90 Package 2 Taking   Allergies  Allergen Reactions  . Amoxicillin Rash and Anaphylaxis    Upper torso only.  . Boostrix [Tetanus-Diphth-Acell Pertussis] Other (See Comments)    Neuropathy symptoms  . Other Other (See Comments)    Flu shot-neuropathy symptoms    Social History   Tobacco Use  . Smoking status: Former Smoker    Packs/day: 1.00    Years: 5.00    Pack years: 5.00    Types: Cigarettes    Last attempt to quit: 07/01/1965    Years since quitting: 52.5  . Smokeless tobacco: Never Used  Substance Use Topics  . Alcohol use: No    Family History  Problem Relation Age of Onset  . Asthma Mother    . Heart disease Mother 92  . Coronary artery disease Father   . Heart disease Father   . Cancer Sister 32       breast     Review of Systems  Constitutional: Positive for malaise/fatigue.  HENT: Negative.   Eyes: Negative.   Respiratory: Positive for shortness of breath (with exertion).   Cardiovascular: Negative.   Gastrointestinal: Positive for constipation.  Genitourinary: Positive for frequency and urgency.  Musculoskeletal: Positive for joint pain.  Skin: Negative.   Neurological: Positive for weakness.  Endo/Heme/Allergies: Negative.   Psychiatric/Behavioral: Negative.     Objective:  Physical Exam  Constitutional: He is oriented to person, place,  and time. He appears well-developed.  HENT:  Head: Normocephalic.  Eyes: Pupils are equal, round, and reactive to light.  Neck: Neck supple. No JVD present. No tracheal deviation present. No thyromegaly present.  Cardiovascular: Normal rate, regular rhythm and intact distal pulses.  Respiratory: Effort normal and breath sounds normal. No respiratory distress. He has no wheezes.  GI: Soft. There is no tenderness. There is no guarding.  Musculoskeletal:       Right knee: He exhibits decreased range of motion, swelling and bony tenderness. He exhibits no ecchymosis, no deformity, no laceration and no erythema. Tenderness found.  Lymphadenopathy:    He has no cervical adenopathy.  Neurological: He is alert and oriented to person, place, and time. A sensory deficit (bilateral LE neuropathy, bilateral drop foot) is present.  Skin: Skin is warm and dry.  Psychiatric: He has a normal mood and affect.     Labs:  Estimated body mass index is 26.99 kg/m as calculated from the following:   Height as of 10/19/17: $RemoveBef'6\' 1"'LEfxXCqLvh$  (1.854 m).   Weight as of 10/19/17: 92.8 kg (204 lb 9.6 oz).   Imaging Review Plain radiographs demonstrate severe degenerative joint disease of the right knee(s). The bone quality appears to be good for age and  reported activity level.     Assessment/Plan:  End stage arthritis, right knee   The patient history, physical examination, clinical judgment of the provider and imaging studies are consistent with end stage degenerative joint disease of the right knee(s) and total knee arthroplasty is deemed medically necessary. The treatment options including medical management, injection therapy arthroscopy and arthroplasty were discussed at length. The risks and benefits of total knee arthroplasty were presented and reviewed. The risks due to aseptic loosening, infection, stiffness, patella tracking problems, thromboembolic complications and other imponderables were discussed. The patient acknowledged the explanation, agreed to proceed with the plan and consent was signed. Patient is being admitted for inpatient treatment for surgery, pain control, PT, OT, prophylactic antibiotics, VTE prophylaxis, progressive ambulation and ADL's and discharge planning. The patient is planning to be discharged home.    [place] Emma Birchler   PA-C  12/18/2017, 8:06 PM

## 2017-12-19 ENCOUNTER — Ambulatory Visit: Payer: Medicare HMO | Admitting: Cardiology

## 2017-12-19 DIAGNOSIS — R1032 Left lower quadrant pain: Secondary | ICD-10-CM | POA: Diagnosis not present

## 2017-12-21 ENCOUNTER — Other Ambulatory Visit: Payer: Self-pay | Admitting: Physician Assistant

## 2017-12-21 ENCOUNTER — Ambulatory Visit
Admission: RE | Admit: 2017-12-21 | Discharge: 2017-12-21 | Disposition: A | Discharge status: Home / Self Care | Payer: Medicare HMO | Source: Ambulatory Visit | Attending: Physician Assistant | Admitting: Physician Assistant

## 2017-12-21 DIAGNOSIS — Z8719 Personal history of other diseases of the digestive system: Secondary | ICD-10-CM

## 2017-12-21 DIAGNOSIS — R1032 Left lower quadrant pain: Secondary | ICD-10-CM | POA: Diagnosis not present

## 2017-12-21 DIAGNOSIS — M545 Low back pain: Secondary | ICD-10-CM

## 2017-12-21 DIAGNOSIS — K5904 Chronic idiopathic constipation: Secondary | ICD-10-CM | POA: Diagnosis not present

## 2017-12-21 DIAGNOSIS — Z1211 Encounter for screening for malignant neoplasm of colon: Secondary | ICD-10-CM | POA: Diagnosis not present

## 2017-12-21 MED ORDER — IOPAMIDOL (ISOVUE-300) INJECTION 61%
100.0000 mL | Freq: Once | INTRAVENOUS | Status: AC | PRN
  Administered 2017-12-21: 100 mL via INTRAVENOUS

## 2017-12-22 NOTE — Telephone Encounter (Signed)
   Primary Cardiologist: Dr Martinique  Chart reviewed and patient contacted by phone as part of pre-operative protocol coverage. Given past medical history and time since last visit, and his lack of symptoms, based on ACC/AHA guidelines, CHOSEN GESKE would be at acceptable risk for the planned procedure without further cardiovascular testing.   I will route this recommendation to the requesting party via Epic fax function and remove from pre-op pool.  Please call with questions.  Kerin Ransom, PA-C 12/22/2017, 11:20 AM

## 2017-12-23 ENCOUNTER — Encounter (HOSPITAL_COMMUNITY): Payer: Self-pay

## 2017-12-23 ENCOUNTER — Other Ambulatory Visit (HOSPITAL_COMMUNITY): Payer: Self-pay | Admitting: *Deleted

## 2017-12-23 NOTE — Progress Notes (Signed)
CARDIAC CLEARANCE LUKE KILROY PAC 12-16-17 Epic LOV DR Martinique CARDIOLOGY 07-26-18 Epic EKG 07-26-17 Epic

## 2017-12-23 NOTE — Patient Instructions (Signed)
Brandon Ray  12/23/2017   Your procedure is scheduled on: 01-03-17  Report to [facility] Main  Entrance  Report to admitting at 630 AM  Call this number if you have problems the morning of surgery [phone]   Remember: Do not eat food or drink liquids :After Midnight.     Take these medicines the morning of surgery with A SIP OF WATER: PREGABALIN  (LYRICA) DO NOT TAKE ANY DIABETIC MEDICATIONS DAY OF YOUR SURGERY                               You may not have any metal on your body including hair pins and              piercings  Do not wear jewelry, make-up, lotions, powders or perfumes, deodorant             Do not wear nail polish.  Do not shave  48 hours prior to surgery.              Men may shave face and neck.   Do not bring valuables to the hospital. [place].  Contacts, dentures or bridgework may not be worn into surgery.  Leave suitcase in the car. After surgery it may be brought to your room.                  Please read over the following fact sheets you were given: _____________________________________________________________________             [facility] - Preparing for Surgery Before surgery, you can play an important role.  Because skin is not sterile, your skin needs to be as free of germs as possible.  You can reduce the number of germs on your skin by washing with CHG (chlorahexidine gluconate) soap before surgery.  CHG is an antiseptic cleaner which kills germs and bonds with the skin to continue killing germs even after washing. Please DO NOT use if you have an allergy to CHG or antibacterial soaps.  If your skin becomes reddened/irritated stop using the CHG and inform your nurse when you arrive at Short Stay. Do not shave (including legs and underarms) for at least 48 hours prior to the first CHG shower.  You may shave your face/neck. Please follow these instructions  carefully:  1.  Shower with CHG Soap the night before surgery and the  morning of Surgery.  2.  If you choose to wash your hair, wash your hair first as usual with your  normal  shampoo.  3.  After you shampoo, rinse your hair and body thoroughly to remove the  shampoo.                           4.  Use CHG as you would any other liquid soap.  You can apply chg directly  to the skin and wash                       Gently with a scrungie or clean washcloth.  5.  Apply the CHG Soap to your body ONLY FROM THE NECK DOWN.   Do not use on face/ open  Wound or open sores. Avoid contact with eyes, ears mouth and genitals (private parts).                       Wash face,  Genitals (private parts) with your normal soap.             6.  Wash thoroughly, paying special attention to the area where your surgery  will be performed.  7.  Thoroughly rinse your body with warm water from the neck down.  8.  DO NOT shower/wash with your normal soap after using and rinsing off  the CHG Soap.                9.  Pat yourself dry with a clean towel.            10.  Wear clean pajamas.            11.  Place clean sheets on your bed the night of your first shower and do not  sleep with pets. Day of Surgery : Do not apply any lotions/deodorants the morning of surgery.  Please wear clean clothes to the hospital/surgery center.  FAILURE TO FOLLOW THESE INSTRUCTIONS MAY RESULT IN THE CANCELLATION OF YOUR SURGERY PATIENT SIGNATURE_________________________________  NURSE SIGNATURE__________________________________  ________________________________________________________________________   Adam Phenix  An incentive spirometer is a tool that can help keep your lungs clear and active. This tool measures how well you are filling your lungs with each breath. Taking long deep breaths may help reverse or decrease the chance of developing breathing (pulmonary) problems (especially infection)  following:  A long period of time when you are unable to move or be active. BEFORE THE PROCEDURE   If the spirometer includes an indicator to show your best effort, your nurse or respiratory therapist will set it to a desired goal.  If possible, sit up straight or lean slightly forward. Try not to slouch.  Hold the incentive spirometer in an upright position. INSTRUCTIONS FOR USE  1. Sit on the edge of your bed if possible, or sit up as far as you can in bed or on a chair. 2. Hold the incentive spirometer in an upright position. 3. Breathe out normally. 4. Place the mouthpiece in your mouth and seal your lips tightly around it. 5. Breathe in slowly and as deeply as possible, raising the piston or the ball toward the top of the column. 6. Hold your breath for 3-5 seconds or for as long as possible. Allow the piston or ball to fall to the bottom of the column. 7. Remove the mouthpiece from your mouth and breathe out normally. 8. Rest for a few seconds and repeat Steps 1 through 7 at least 10 times every 1-2 hours when you are awake. Take your time and take a few normal breaths between deep breaths. 9. The spirometer may include an indicator to show your best effort. Use the indicator as a goal to work toward during each repetition. 10. After each set of 10 deep breaths, practice coughing to be sure your lungs are clear. If you have an incision (the cut made at the time of surgery), support your incision when coughing by placing a pillow or rolled up towels firmly against it. Once you are able to get out of bed, walk around indoors and cough well. You may stop using the incentive spirometer when instructed by your caregiver.  RISKS AND COMPLICATIONS  Take your time so you do not get  dizzy or light-headed.  If you are in pain, you may need to take or ask for pain medication before doing incentive spirometry. It is harder to take a deep breath if you are having pain. AFTER USE  Rest and  breathe slowly and easily.  It can be helpful to keep track of a log of your progress. Your caregiver can provide you with a simple table to help with this. If you are using the spirometer at home, follow these instructions: [place] IF:   You are having difficultly using the spirometer.  You have trouble using the spirometer as often as instructed.  Your pain medication is not giving enough relief while using the spirometer.  You develop fever of 100.5 F (38.1 C) or higher. SEEK IMMEDIATE MEDICAL CARE IF:   You cough up bloody sputum that had not been present before.  You develop fever of 102 F (38.9 C) or greater.  You develop worsening pain at or near the incision site. MAKE SURE YOU:   Understand these instructions.  Will watch your condition.  Will get help right away if you are not doing well or get worse. Document Released: 01/17/2007 Document Revised: 11/29/2011 Document Reviewed: 03/20/2007 ExitCare Patient Information 2014 Exit[employer], [place].   ________________________________________________________________________  WHAT IS A BLOOD TRANSFUSION? Blood Transfusion Information  A transfusion is the replacement of blood or some of its parts. Blood is made up of multiple cells which provide different functions.  Red blood cells carry oxygen and are used for blood loss replacement.  White blood cells fight against infection.  Platelets control bleeding.  Plasma helps clot blood.  Other blood products are available for specialized needs, such as hemophilia or other clotting disorders. BEFORE THE TRANSFUSION  Who gives blood for transfusions?   Healthy volunteers who are fully evaluated to make sure their blood is safe. This is blood bank blood. Transfusion therapy is the safest it has ever been in the practice of medicine. Before blood is taken from a donor, a complete history is taken to make sure that person has no history of diseases nor engages in  risky social behavior (examples are intravenous drug use or sexual activity with multiple partners). The donor's travel history is screened to minimize risk of transmitting infections, such as malaria. The donated blood is tested for signs of infectious diseases, such as HIV and hepatitis. The blood is then tested to be sure it is compatible with you in order to minimize the chance of a transfusion reaction. If you or a relative donates blood, this is often done in anticipation of surgery and is not appropriate for emergency situations. It takes many days to process the donated blood. RISKS AND COMPLICATIONS Although transfusion therapy is very safe and saves many lives, the main dangers of transfusion include:   Getting an infectious disease.  Developing a transfusion reaction. This is an allergic reaction to something in the blood you were given. Every precaution is taken to prevent this. The decision to have a blood transfusion has been considered carefully by your caregiver before blood is given. Blood is not given unless the benefits outweigh the risks. AFTER THE TRANSFUSION  Right after receiving a blood transfusion, you will usually feel much better and more energetic. This is especially true if your red blood cells have gotten low (anemic). The transfusion raises the level of the red blood cells which carry oxygen, and this usually causes an energy increase.  The nurse administering the transfusion will  monitor you carefully for complications. HOME CARE INSTRUCTIONS  No special instructions are needed after a transfusion. You may find your energy is better. Speak with your caregiver about any limitations on activity for underlying diseases you may have. SEEK MEDICAL CARE IF:   Your condition is not improving after your transfusion.  You develop redness or irritation at the intravenous (IV) site. SEEK IMMEDIATE MEDICAL CARE IF:  Any of the following symptoms occur over the next 12  hours:  Shaking chills.  You have a temperature by mouth above 102 F (38.9 C), not controlled by medicine.  Chest, back, or muscle pain.  People around you feel you are not acting correctly or are confused.  Shortness of breath or difficulty breathing.  Dizziness and fainting.  You get a rash or develop hives.  You have a decrease in urine output.  Your urine turns a dark color or changes to pink, red, or brown. Any of the following symptoms occur over the next 10 days:  You have a temperature by mouth above 102 F (38.9 C), not controlled by medicine.  Shortness of breath.  Weakness after normal activity.  The white part of the eye turns yellow (jaundice).  You have a decrease in the amount of urine or are urinating less often.  Your urine turns a dark color or changes to pink, red, or brown. Document Released: 09/03/2000 Document Revised: 11/29/2011 Document Reviewed: 04/22/2008 [facility] Patient Information 2014 [place], [place].  _______________________________________________________________________

## 2017-12-29 ENCOUNTER — Other Ambulatory Visit: Payer: Self-pay

## 2017-12-29 ENCOUNTER — Encounter (HOSPITAL_COMMUNITY)
Admission: RE | Admit: 2017-12-29 | Discharge: 2017-12-29 | Disposition: A | Discharge status: Home / Self Care | Payer: Medicare HMO | Source: Ambulatory Visit | Attending: Orthopedic Surgery | Admitting: Orthopedic Surgery

## 2017-12-29 ENCOUNTER — Encounter (HOSPITAL_COMMUNITY): Payer: Self-pay

## 2017-12-29 DIAGNOSIS — Z01812 Encounter for preprocedural laboratory examination: Secondary | ICD-10-CM | POA: Diagnosis not present

## 2017-12-29 DIAGNOSIS — M1711 Unilateral primary osteoarthritis, right knee: Secondary | ICD-10-CM | POA: Diagnosis not present

## 2017-12-29 DIAGNOSIS — Z0183 Encounter for blood typing: Secondary | ICD-10-CM | POA: Diagnosis not present

## 2017-12-29 HISTORY — DX: Personal history of other medical treatment: Z92.89

## 2017-12-29 HISTORY — DX: Unspecified osteoarthritis, unspecified site: M19.90

## 2017-12-29 HISTORY — DX: Unspecified macular degeneration: H35.30

## 2017-12-29 LAB — CBC
HCT: 48.5 % (ref 39.0–52.0)
HEMOGLOBIN: 16.4 g/dL (ref 13.0–17.0)
MCH: 31.8 pg (ref 26.0–34.0)
MCHC: 33.8 g/dL (ref 30.0–36.0)
MCV: 94 fL (ref 78.0–100.0)
PLATELETS: 225 10*3/uL (ref 150–400)
RBC: 5.16 MIL/uL (ref 4.22–5.81)
RDW: 14.1 % (ref 11.5–15.5)
WBC: 4 10*3/uL (ref 4.0–10.5)

## 2017-12-29 LAB — BASIC METABOLIC PANEL
ANION GAP: 8 (ref 5–15)
BUN: 10 mg/dL (ref 6–20)
CO2: 28 mmol/L (ref 22–32)
CREATININE: 0.81 mg/dL (ref 0.61–1.24)
Calcium: 9.2 mg/dL (ref 8.9–10.3)
Chloride: 99 mmol/L — ABNORMAL LOW (ref 101–111)
GFR calc non Af Amer: >60 mL/min (ref >60)
Glucose, Bld: 99 mg/dL (ref 65–99)
Potassium: 4.9 mmol/L (ref 3.5–5.1)
SODIUM: 135 mmol/L (ref 135–145)

## 2017-12-29 LAB — SURGICAL PCR SCREEN
MRSA, PCR: NEGATIVE
STAPHYLOCOCCUS AUREUS: NEGATIVE

## 2017-12-29 LAB — ABO/RH: ABO/RH(D): O POS

## 2018-01-02 MED ORDER — SODIUM CHLORIDE 0.9 % IV SOLN
1000.0000 mg | INTRAVENOUS | Status: AC
  Administered 2018-01-03: 1000 mg via INTRAVENOUS
  Filled 2018-01-02: qty 1100

## 2018-01-02 MED ORDER — GENTAMICIN SULFATE 40 MG/ML IJ SOLN
5.0000 mg/kg | INTRAVENOUS | Status: AC
  Administered 2018-01-03: 400 mg via INTRAVENOUS
  Filled 2018-01-02: qty 10

## 2018-01-03 ENCOUNTER — Other Ambulatory Visit: Payer: Self-pay

## 2018-01-03 ENCOUNTER — Encounter (HOSPITAL_COMMUNITY): Payer: Self-pay

## 2018-01-03 ENCOUNTER — Encounter (HOSPITAL_COMMUNITY)
Admission: RE | Disposition: A | Discharge status: Home / Self Care | Payer: Self-pay | Source: Ambulatory Visit | Attending: Orthopedic Surgery

## 2018-01-03 ENCOUNTER — Inpatient Hospital Stay (HOSPITAL_COMMUNITY): Payer: Medicare HMO | Admitting: Registered Nurse

## 2018-01-03 ENCOUNTER — Inpatient Hospital Stay (HOSPITAL_COMMUNITY)
Admission: RE | Admit: 2018-01-03 | Discharge: 2018-01-05 | DRG: 470 | Disposition: A | Discharge status: Home / Self Care | Payer: Medicare HMO | Source: Ambulatory Visit | Attending: Orthopedic Surgery | Admitting: Orthopedic Surgery

## 2018-01-03 DIAGNOSIS — N400 Enlarged prostate without lower urinary tract symptoms: Secondary | ICD-10-CM | POA: Diagnosis not present

## 2018-01-03 DIAGNOSIS — H3530 Unspecified macular degeneration: Secondary | ICD-10-CM | POA: Diagnosis present

## 2018-01-03 DIAGNOSIS — M810 Age-related osteoporosis without current pathological fracture: Secondary | ICD-10-CM | POA: Diagnosis present

## 2018-01-03 DIAGNOSIS — E663 Overweight: Secondary | ICD-10-CM | POA: Diagnosis present

## 2018-01-03 DIAGNOSIS — G629 Polyneuropathy, unspecified: Secondary | ICD-10-CM | POA: Diagnosis not present

## 2018-01-03 DIAGNOSIS — Z951 Presence of aortocoronary bypass graft: Secondary | ICD-10-CM | POA: Diagnosis not present

## 2018-01-03 DIAGNOSIS — Z96659 Presence of unspecified artificial knee joint: Secondary | ICD-10-CM

## 2018-01-03 DIAGNOSIS — D472 Monoclonal gammopathy: Secondary | ICD-10-CM | POA: Diagnosis present

## 2018-01-03 DIAGNOSIS — E785 Hyperlipidemia, unspecified: Secondary | ICD-10-CM | POA: Diagnosis present

## 2018-01-03 DIAGNOSIS — Z809 Family history of malignant neoplasm, unspecified: Secondary | ICD-10-CM

## 2018-01-03 DIAGNOSIS — M1711 Unilateral primary osteoarthritis, right knee: Secondary | ICD-10-CM | POA: Diagnosis not present

## 2018-01-03 DIAGNOSIS — G8918 Other acute postprocedural pain: Secondary | ICD-10-CM | POA: Diagnosis not present

## 2018-01-03 DIAGNOSIS — Z6825 Body mass index (BMI) 25.0-25.9, adult: Secondary | ICD-10-CM

## 2018-01-03 DIAGNOSIS — Z825 Family history of asthma and other chronic lower respiratory diseases: Secondary | ICD-10-CM

## 2018-01-03 DIAGNOSIS — Z887 Allergy status to serum and vaccine status: Secondary | ICD-10-CM

## 2018-01-03 DIAGNOSIS — Z7982 Long term (current) use of aspirin: Secondary | ICD-10-CM

## 2018-01-03 DIAGNOSIS — Z9079 Acquired absence of other genital organ(s): Secondary | ICD-10-CM

## 2018-01-03 DIAGNOSIS — R339 Retention of urine, unspecified: Secondary | ICD-10-CM | POA: Diagnosis not present

## 2018-01-03 DIAGNOSIS — Z8249 Family history of ischemic heart disease and other diseases of the circulatory system: Secondary | ICD-10-CM

## 2018-01-03 DIAGNOSIS — I2510 Atherosclerotic heart disease of native coronary artery without angina pectoris: Secondary | ICD-10-CM | POA: Diagnosis not present

## 2018-01-03 DIAGNOSIS — N319 Neuromuscular dysfunction of bladder, unspecified: Secondary | ICD-10-CM | POA: Diagnosis not present

## 2018-01-03 DIAGNOSIS — Z96651 Presence of right artificial knee joint: Secondary | ICD-10-CM

## 2018-01-03 DIAGNOSIS — Z87891 Personal history of nicotine dependence: Secondary | ICD-10-CM

## 2018-01-03 DIAGNOSIS — G6181 Chronic inflammatory demyelinating polyneuritis: Secondary | ICD-10-CM | POA: Diagnosis not present

## 2018-01-03 DIAGNOSIS — Z881 Allergy status to other antibiotic agents status: Secondary | ICD-10-CM

## 2018-01-03 DIAGNOSIS — I25810 Atherosclerosis of coronary artery bypass graft(s) without angina pectoris: Secondary | ICD-10-CM | POA: Diagnosis not present

## 2018-01-03 HISTORY — PX: TOTAL KNEE ARTHROPLASTY: SHX125

## 2018-01-03 LAB — TYPE AND SCREEN
ABO/RH(D): O POS
Antibody Screen: NEGATIVE

## 2018-01-03 SURGERY — ARTHROPLASTY, KNEE, TOTAL
Anesthesia: Monitor Anesthesia Care | Site: Knee | Laterality: Right

## 2018-01-03 MED ORDER — CHLORHEXIDINE GLUCONATE 4 % EX LIQD: 60.0000 mL | Freq: Once | CUTANEOUS | Status: DC

## 2018-01-03 MED ORDER — BISACODYL 10 MG RE SUPP: 10.0000 mg | Freq: Every day | RECTAL | Status: DC | PRN

## 2018-01-03 MED ORDER — VANCOMYCIN HCL IN DEXTROSE 1-5 GM/200ML-% IV SOLN
1000.0000 mg | INTRAVENOUS | Status: AC
  Administered 2018-01-03: 1000 mg via INTRAVENOUS
  Filled 2018-01-03: qty 200

## 2018-01-03 MED ORDER — MIDAZOLAM HCL 2 MG/2ML IJ SOLN
1.0000 mg | INTRAMUSCULAR | Status: DC
Start: 2018-01-03 — End: 2018-01-03
  Filled 2018-01-03: qty 2

## 2018-01-03 MED ORDER — ATORVASTATIN CALCIUM 20 MG PO TABS
20.0000 mg | ORAL_TABLET | Freq: Every day | ORAL | Status: DC
  Administered 2018-01-03 – 2018-01-04 (×2): 20 mg via ORAL
  Filled 2018-01-03 (×2): qty 1

## 2018-01-03 MED ORDER — KETOROLAC TROMETHAMINE 30 MG/ML IJ SOLN
INTRAMUSCULAR | Status: DC | PRN
  Administered 2018-01-03: 30 mg

## 2018-01-03 MED ORDER — METOCLOPRAMIDE HCL 5 MG PO TABS: 5.0000 mg | ORAL_TABLET | Freq: Three times a day (TID) | ORAL | Status: DC | PRN

## 2018-01-03 MED ORDER — PHENYLEPHRINE 40 MCG/ML (10ML) SYRINGE FOR IV PUSH (FOR BLOOD PRESSURE SUPPORT)
PREFILLED_SYRINGE | INTRAVENOUS | Status: DC | PRN
  Administered 2018-01-03: 80 ug via INTRAVENOUS

## 2018-01-03 MED ORDER — FERROUS SULFATE 325 (65 FE) MG PO TABS
325.0000 mg | ORAL_TABLET | Freq: Three times a day (TID) | ORAL | Status: DC
  Administered 2018-01-03 – 2018-01-05 (×4): 325 mg via ORAL
  Filled 2018-01-03 (×5): qty 1

## 2018-01-03 MED ORDER — DEXAMETHASONE SODIUM PHOSPHATE 10 MG/ML IJ SOLN
10.0000 mg | Freq: Once | INTRAMUSCULAR | Status: AC
  Administered 2018-01-03: 10 mg via INTRAVENOUS

## 2018-01-03 MED ORDER — MENTHOL 3 MG MT LOZG: 1.0000 | LOZENGE | OROMUCOSAL | Status: DC | PRN

## 2018-01-03 MED ORDER — ROPIVACAINE HCL 7.5 MG/ML IJ SOLN
INTRAMUSCULAR | Status: DC | PRN
  Administered 2018-01-03: 20 mL via PERINEURAL

## 2018-01-03 MED ORDER — DEXAMETHASONE SODIUM PHOSPHATE 10 MG/ML IJ SOLN
10.0000 mg | Freq: Once | INTRAMUSCULAR | Status: AC
  Administered 2018-01-04: 10 mg via INTRAVENOUS
  Filled 2018-01-03: qty 1

## 2018-01-03 MED ORDER — LIDOCAINE 2% (20 MG/ML) 5 ML SYRINGE
INTRAMUSCULAR | Status: AC
  Filled 2018-01-03: qty 5

## 2018-01-03 MED ORDER — BUPIVACAINE HCL (PF) 0.25 % IJ SOLN
INTRAMUSCULAR | Status: AC
  Filled 2018-01-03: qty 30

## 2018-01-03 MED ORDER — POLYETHYLENE GLYCOL 3350 17 G PO PACK
17.0000 g | PACK | Freq: Two times a day (BID) | ORAL | Status: DC
  Administered 2018-01-03 – 2018-01-05 (×4): 17 g via ORAL
  Filled 2018-01-03 (×4): qty 1

## 2018-01-03 MED ORDER — PROPOFOL 500 MG/50ML IV EMUL
INTRAVENOUS | Status: DC | PRN
  Administered 2018-01-03: 40 ug/kg/min via INTRAVENOUS

## 2018-01-03 MED ORDER — PHENOBARBITAL 32.4 MG PO TABS
97.2000 mg | ORAL_TABLET | Freq: Every day | ORAL | Status: DC
  Administered 2018-01-04: 97.2 mg via ORAL
  Filled 2018-01-03: qty 3

## 2018-01-03 MED ORDER — SODIUM CHLORIDE 0.9 % IV SOLN
INTRAVENOUS | Status: DC
  Administered 2018-01-03: 15:00:00 via INTRAVENOUS

## 2018-01-03 MED ORDER — PROPOFOL 10 MG/ML IV BOLUS
INTRAVENOUS | Status: AC
  Filled 2018-01-03: qty 40

## 2018-01-03 MED ORDER — CELECOXIB 200 MG PO CAPS
200.0000 mg | ORAL_CAPSULE | Freq: Two times a day (BID) | ORAL | Status: DC
  Administered 2018-01-03 – 2018-01-05 (×4): 200 mg via ORAL
  Filled 2018-01-03 (×4): qty 1

## 2018-01-03 MED ORDER — ALUM & MAG HYDROXIDE-SIMETH 200-200-20 MG/5ML PO SUSP: 15.0000 mL | ORAL | Status: DC | PRN

## 2018-01-03 MED ORDER — VANCOMYCIN HCL IN DEXTROSE 1-5 GM/200ML-% IV SOLN
1000.0000 mg | Freq: Two times a day (BID) | INTRAVENOUS | Status: AC
  Administered 2018-01-03: 1000 mg via INTRAVENOUS
  Filled 2018-01-03: qty 200

## 2018-01-03 MED ORDER — ACETAMINOPHEN 325 MG PO TABS: 325.0000 mg | ORAL_TABLET | Freq: Four times a day (QID) | ORAL | Status: DC | PRN

## 2018-01-03 MED ORDER — FENTANYL CITRATE (PF) 100 MCG/2ML IJ SOLN: 25.0000 ug | INTRAMUSCULAR | Status: DC | PRN

## 2018-01-03 MED ORDER — ONDANSETRON HCL 4 MG/2ML IJ SOLN: 4.0000 mg | Freq: Four times a day (QID) | INTRAMUSCULAR | Status: DC | PRN

## 2018-01-03 MED ORDER — ONDANSETRON HCL 4 MG/2ML IJ SOLN
INTRAMUSCULAR | Status: DC | PRN
Start: 2018-01-03 — End: 2018-01-03
  Administered 2018-01-03: 4 mg via INTRAVENOUS

## 2018-01-03 MED ORDER — PROPOFOL 10 MG/ML IV BOLUS
INTRAVENOUS | Status: AC
  Filled 2018-01-03: qty 20

## 2018-01-03 MED ORDER — OXYCODONE HCL 5 MG/5ML PO SOLN
5.0000 mg | Freq: Once | ORAL | Status: DC | PRN
  Filled 2018-01-03: qty 5

## 2018-01-03 MED ORDER — METHOCARBAMOL 500 MG PO TABS
500.0000 mg | ORAL_TABLET | Freq: Four times a day (QID) | ORAL | Status: DC | PRN
  Administered 2018-01-03: 500 mg via ORAL
  Filled 2018-01-03: qty 1

## 2018-01-03 MED ORDER — ONDANSETRON HCL 4 MG/2ML IJ SOLN
INTRAMUSCULAR | Status: AC
  Filled 2018-01-03: qty 2

## 2018-01-03 MED ORDER — PHENOL 1.4 % MT LIQD
1.0000 | OROMUCOSAL | Status: DC | PRN
  Filled 2018-01-03: qty 177

## 2018-01-03 MED ORDER — HYDROCODONE-ACETAMINOPHEN 7.5-325 MG PO TABS: 1.0000 | ORAL_TABLET | ORAL | Status: DC | PRN

## 2018-01-03 MED ORDER — TRANEXAMIC ACID 1000 MG/10ML IV SOLN
1000.0000 mg | Freq: Once | INTRAVENOUS | Status: AC
  Administered 2018-01-03: 1000 mg via INTRAVENOUS
  Filled 2018-01-03: qty 1100

## 2018-01-03 MED ORDER — PREGABALIN 75 MG PO CAPS
75.0000 mg | ORAL_CAPSULE | Freq: Four times a day (QID) | ORAL | Status: DC
  Administered 2018-01-03 – 2018-01-05 (×8): 75 mg via ORAL
  Filled 2018-01-03 (×7): qty 1

## 2018-01-03 MED ORDER — BUPIVACAINE HCL (PF) 0.25 % IJ SOLN
INTRAMUSCULAR | Status: DC | PRN
  Administered 2018-01-03: 30 mL

## 2018-01-03 MED ORDER — SODIUM CHLORIDE 0.9 % IJ SOLN
INTRAMUSCULAR | Status: AC
  Filled 2018-01-03: qty 50

## 2018-01-03 MED ORDER — DOCUSATE SODIUM 100 MG PO CAPS
100.0000 mg | ORAL_CAPSULE | Freq: Two times a day (BID) | ORAL | Status: DC
  Administered 2018-01-03 – 2018-01-05 (×4): 100 mg via ORAL
  Filled 2018-01-03 (×5): qty 1

## 2018-01-03 MED ORDER — MORPHINE SULFATE (PF) 2 MG/ML IV SOLN
0.5000 mg | INTRAVENOUS | Status: DC | PRN
  Administered 2018-01-04: 1 mg via INTRAVENOUS
  Filled 2018-01-03: qty 1

## 2018-01-03 MED ORDER — TAMSULOSIN HCL 0.4 MG PO CAPS
0.4000 mg | ORAL_CAPSULE | Freq: Every day | ORAL | Status: DC
  Administered 2018-01-03 – 2018-01-04 (×2): 0.4 mg via ORAL
  Filled 2018-01-03 (×2): qty 1

## 2018-01-03 MED ORDER — DEXAMETHASONE SODIUM PHOSPHATE 10 MG/ML IJ SOLN
INTRAMUSCULAR | Status: AC
  Filled 2018-01-03: qty 1

## 2018-01-03 MED ORDER — LACTATED RINGERS IV SOLN
INTRAVENOUS | Status: DC
  Administered 2018-01-03: 08:00:00 via INTRAVENOUS

## 2018-01-03 MED ORDER — FENTANYL CITRATE (PF) 100 MCG/2ML IJ SOLN
50.0000 ug | INTRAMUSCULAR | Status: DC
  Filled 2018-01-03: qty 2

## 2018-01-03 MED ORDER — METHOCARBAMOL 1000 MG/10ML IJ SOLN
500.0000 mg | Freq: Four times a day (QID) | INTRAVENOUS | Status: DC | PRN
  Filled 2018-01-03: qty 5

## 2018-01-03 MED ORDER — METOCLOPRAMIDE HCL 5 MG/ML IJ SOLN: 5.0000 mg | Freq: Three times a day (TID) | INTRAMUSCULAR | Status: DC | PRN

## 2018-01-03 MED ORDER — SODIUM CHLORIDE 0.9 % IJ SOLN
INTRAMUSCULAR | Status: DC | PRN
  Administered 2018-01-03: 30 mL

## 2018-01-03 MED ORDER — KETOROLAC TROMETHAMINE 30 MG/ML IJ SOLN
INTRAMUSCULAR | Status: AC
  Filled 2018-01-03: qty 1

## 2018-01-03 MED ORDER — PHENYLEPHRINE HCL 10 MG/ML IJ SOLN
INTRAVENOUS | Status: DC | PRN
  Administered 2018-01-03: 25 ug/min via INTRAVENOUS

## 2018-01-03 MED ORDER — ASPIRIN 81 MG PO CHEW
81.0000 mg | CHEWABLE_TABLET | Freq: Two times a day (BID) | ORAL | Status: DC
  Administered 2018-01-03 – 2018-01-05 (×4): 81 mg via ORAL
  Filled 2018-01-03 (×4): qty 1

## 2018-01-03 MED ORDER — ONDANSETRON HCL 4 MG PO TABS: 4.0000 mg | ORAL_TABLET | Freq: Four times a day (QID) | ORAL | Status: DC | PRN

## 2018-01-03 MED ORDER — OXYCODONE HCL 5 MG PO TABS: 5.0000 mg | ORAL_TABLET | Freq: Once | ORAL | Status: DC | PRN

## 2018-01-03 MED ORDER — MAGNESIUM CITRATE PO SOLN: 1.0000 | Freq: Once | ORAL | Status: DC | PRN

## 2018-01-03 MED ORDER — HYDROCODONE-ACETAMINOPHEN 5-325 MG PO TABS
1.0000 | ORAL_TABLET | ORAL | Status: DC | PRN
  Administered 2018-01-03: 1 via ORAL
  Administered 2018-01-04 – 2018-01-05 (×4): 2 via ORAL
  Filled 2018-01-03 (×3): qty 2
  Filled 2018-01-03: qty 1
  Filled 2018-01-03: qty 2

## 2018-01-03 MED ORDER — DIPHENHYDRAMINE HCL 12.5 MG/5ML PO ELIX: 12.5000 mg | ORAL_SOLUTION | ORAL | Status: DC | PRN

## 2018-01-03 MED ORDER — BUPIVACAINE IN DEXTROSE 0.75-8.25 % IT SOLN
INTRATHECAL | Status: DC | PRN
  Administered 2018-01-03: 2 mL via INTRATHECAL

## 2018-01-03 MED ORDER — SODIUM CHLORIDE 0.9 % IR SOLN
Status: DC | PRN
  Administered 2018-01-03: 1000 mL

## 2018-01-03 SURGICAL SUPPLY — 46 items
BAG DECANTER FOR FLEXI CONT (MISCELLANEOUS) IMPLANT
BAG ZIPLOCK 12X15 (MISCELLANEOUS) IMPLANT
BANDAGE ACE 6X5 VEL STRL LF (GAUZE/BANDAGES/DRESSINGS) ×2 IMPLANT
BLADE SAW SGTL 11.0X1.19X90.0M (BLADE) IMPLANT
BLADE SAW SGTL 13.0X1.19X90.0M (BLADE) ×2 IMPLANT
BOWL SMART MIX CTS (DISPOSABLE) ×2 IMPLANT
CAPT KNEE TOTAL 3 ATTUNE ×2 IMPLANT
CEMENT HV SMART SET (Cement) ×4 IMPLANT
COVER SURGICAL LIGHT HANDLE (MISCELLANEOUS) ×2 IMPLANT
CUFF TOURN SGL QUICK 34 (TOURNIQUET CUFF) ×1
CUFF TRNQT CYL 34X4X40X1 (TOURNIQUET CUFF) ×1 IMPLANT
DECANTER SPIKE VIAL GLASS SM (MISCELLANEOUS) ×2 IMPLANT
DERMABOND ADVANCED (GAUZE/BANDAGES/DRESSINGS) ×1
DERMABOND ADVANCED .7 DNX12 (GAUZE/BANDAGES/DRESSINGS) ×1 IMPLANT
DRAPE TOP 10253 STERILE (DRAPES) IMPLANT
DRAPE U-SHAPE 47X51 STRL (DRAPES) ×2 IMPLANT
DRESSING AQUACEL AG SP 3.5X10 (GAUZE/BANDAGES/DRESSINGS) ×1 IMPLANT
DRSG AQUACEL AG SP 3.5X10 (GAUZE/BANDAGES/DRESSINGS) ×2
DURAPREP 26ML APPLICATOR (WOUND CARE) ×4 IMPLANT
ELECT REM PT RETURN 15FT ADLT (MISCELLANEOUS) ×2 IMPLANT
GLOVE BIOGEL M 7.0 STRL (GLOVE) IMPLANT
GLOVE BIOGEL PI IND STRL 7.5 (GLOVE) ×1 IMPLANT
GLOVE BIOGEL PI IND STRL 8.5 (GLOVE) ×1 IMPLANT
GLOVE BIOGEL PI INDICATOR 7.5 (GLOVE) ×1
GLOVE BIOGEL PI INDICATOR 8.5 (GLOVE) ×1
GLOVE ECLIPSE 8.0 STRL XLNG CF (GLOVE) ×2 IMPLANT
GLOVE ORTHO TXT STRL SZ7.5 (GLOVE) ×4 IMPLANT
GOWN STRL REUS W/TWL 2XL LVL3 (GOWN DISPOSABLE) ×2 IMPLANT
GOWN STRL REUS W/TWL LRG LVL3 (GOWN DISPOSABLE) ×2 IMPLANT
HANDPIECE INTERPULSE COAX TIP (DISPOSABLE) ×1
MANIFOLD NEPTUNE II (INSTRUMENTS) ×2 IMPLANT
PACK TOTAL KNEE CUSTOM (KITS) ×2 IMPLANT
POSITIONER SURGICAL ARM (MISCELLANEOUS) ×2 IMPLANT
SET HNDPC FAN SPRY TIP SCT (DISPOSABLE) ×1 IMPLANT
SET PAD KNEE POSITIONER (MISCELLANEOUS) ×2 IMPLANT
STRIP CLOSURE SKIN 1/2X4 (GAUZE/BANDAGES/DRESSINGS) ×2 IMPLANT
SUT MNCRL AB 4-0 PS2 18 (SUTURE) ×2 IMPLANT
SUT STRATAFIX PDS+ 0 24IN (SUTURE) ×2 IMPLANT
SUT VIC AB 1 CT1 36 (SUTURE) ×2 IMPLANT
SUT VIC AB 2-0 CT1 27 (SUTURE) ×3
SUT VIC AB 2-0 CT1 TAPERPNT 27 (SUTURE) ×3 IMPLANT
SYR 50ML LL SCALE MARK (SYRINGE) ×2 IMPLANT
TRAY FOLEY W/METER SILVER 16FR (SET/KITS/TRAYS/PACK) ×2 IMPLANT
WATER STERILE IRR 1000ML POUR (IV SOLUTION) ×2 IMPLANT
WRAP KNEE MAXI GEL POST OP (GAUZE/BANDAGES/DRESSINGS) ×2 IMPLANT
YANKAUER SUCT BULB TIP 10FT TU (MISCELLANEOUS) ×2 IMPLANT

## 2018-01-03 NOTE — Discharge Instructions (Signed)

## 2018-01-03 NOTE — Op Note (Signed)
NAME:  Brandon Ray                      MEDICAL RECORD NO.:  [id number]                             FACILITY:  [facility]      PHYSICIAN:  Madlyn Frankel. Charlann Boxer, M.D.  DATE OF BIRTH:  1935/09/17      DATE OF PROCEDURE:  01/03/2018                                     OPERATIVE REPORT         PREOPERATIVE DIAGNOSIS:  Right knee osteoarthritis.      POSTOPERATIVE DIAGNOSIS:  Right knee osteoarthritis.      FINDINGS:  The patient was noted to have complete loss of cartilage and   bone-on-bone arthritis with associated osteophytes in the lateral and patellofemoral compartments of   the knee with a significant synovitis and associated effusion.      PROCEDURE:  Right total knee replacement.      COMPONENTS USED:  DePuy Attune rotating platform posterior stabilized knee   system, a size 6 femur, 6 tibia, size 6 mm PS AOX insert, and 38 anatomic patellar   button.      SURGEON:  Madlyn Frankel. Charlann Boxer, M.D.      ASSISTANT:  Lanney Gins, PA-C.      ANESTHESIA:  Regional and Spinal.      SPECIMENS:  None.      COMPLICATION:  None.      DRAINS:  None.  EBL: <100cc      TOURNIQUET TIME:  36 min at     The patient was stable to the recovery room.      INDICATION FOR PROCEDURE:  Brandon Ray is a 83 y.o. male patient of   mine.  The patient had been seen, evaluated, and treated conservatively in the   office with medication, activity modification, and injections.  The patient had   radiographic changes of bone-on-bone arthritis with endplate sclerosis and osteophytes noted.      The patient failed conservative measures including medication, injections, and activity modification, and at this point was ready for more definitive measures.   Based on the radiographic changes and failed conservative measures, the patient   decided to proceed with total knee replacement.  Risks of infection,   DVT, component failure, need for revision surgery, postop course, and   expectations  were all   discussed and reviewed.  Consent was obtained for benefit of pain   relief.      PROCEDURE IN DETAIL:  The patient was brought to the operative theater.   Once adequate anesthesia, preoperative antibiotics, 2 gm of Ancef, 1 gm of Tranexamic Acid, and 10 mg of Decadron administered, the patient was positioned supine with the right thigh tourniquet placed.  The  right lower extremity was prepped and draped in sterile fashion.  A time-   out was performed identifying the patient, planned procedure, and   extremity.      The right lower extremity was placed in the [facility] leg holder.  The leg was   exsanguinated, tourniquet elevated to 250 mmHg.  A midline incision was   made followed by median parapatellar arthrotomy.  Following initial   exposure, attention was  first directed to the patella.  Precut   measurement was noted to be 25 mm.  I resected down to 14 mm and used a   38 anatomic patellar button to restore patellar height as well as cover the cut   surface.      The lug holes were drilled and a metal shim was placed to protect the   patella from retractors and saw blades.      At this point, attention was now directed to the femur.  The femoral   canal was opened with a drill, irrigated to try to prevent fat emboli.  An   intramedullary rod was passed at 3 degrees valgus, 9 mm of bone was   resected off the distal femur.  Following this resection, the tibia was   subluxated anteriorly.  Using the extramedullary guide, 2 mm of bone was resected off   the proximal lateral tibia.  We confirmed the gap would be   stable medially and laterally with a size 5 spacer block as well as confirmed   the cut was perpendicular in the coronal plane, checking with an alignment rod.      Once this was done, I sized the femur to be a size 6 in the anterior-   posterior dimension, chose a standard component based on medial and   lateral dimension.  The size 6 rotation block was then pinned  in   position anterior referenced using the C-clamp to set rotation.  The   anterior, posterior, and  chamfer cuts were made without difficulty nor   notching making certain that I was along the anterior cortex to help   with flexion gap stability.      The final box cut was made off the lateral aspect of distal femur.      At this point, the tibia was sized to be a size 6, the size 6 tray was   then pinned in position through the medial third of the tubercle,   drilled, and keel punched.  Trial reduction was now carried with a 6 femur,  6 tibia, a size 6 mm PS insert, and the 38 anatomic patella botton.  The knee was brought to   extension, full extension with good flexion stability with the patella   tracking through the trochlea without application of pressure.  Given   all these findings, the trial components removed.  Final components were   opened and cement was mixed.  The knee was irrigated with normal saline   solution and pulse lavage.  The synovial lining was   then injected with 30 cc of 0.25% Marcaine with epinephrine and 1 cc of Toradol plus 30 cc of NS for a total of 61 cc.      The knee was irrigated.  Final implants were then cemented onto clean and   dried cut surfaces of bone with the knee brought to extension with a size 6   mm PS trial insert.      Once the cement had fully cured, the excess cement was removed   throughout the knee.  I confirmed I was satisfied with the range of   motion and stability, and the final size 6 mm PS AOX insert was chosen.  It was   placed into the knee.      The tourniquet had been let down at 36 minutes.  No significant   hemostasis required.  The   extensor mechanism was then reapproximated using #1  Vicryl and #1 Stratafix sutures with the knee   in flexion.  The   remaining wound was closed with 2-0 Vicryl and running 4-0 Monocryl.   The knee was cleaned, dried, dressed sterilely using Dermabond and   Aquacel dressing.  The  patient was then   brought to recovery room in stable condition, tolerating the procedure   well.   Please note that Physician Assistant, Danae Orleans, PA-C, was present for the entirety of the case, and was utilized for pre-operative positioning, peri-operative retractor management, general facilitation of the procedure.  He was also utilized for primary wound closure at the end of the case.              Pietro Cassis Alvan Dame, M.D.    01/03/2018 10:27 AM

## 2018-01-03 NOTE — Evaluation (Signed)
Physical Therapy Evaluation Patient Details Name: Brandon Ray MRN: [id number] DOB: [birth year] Today's Date: 01/03/2018   History of Present Illness  R TKA; h/o B foot drop (has AFOs)  Clinical Impression  Pt is s/p TKA resulting in the deficits listed below (see PT Problem List). Pt ambulated 50' with RW, initiated TKA exercises. Good progress expected.  Pt will benefit from skilled PT to increase their independence and safety with mobility to allow discharge to the venue listed below.      Follow Up Recommendations Follow surgeon's recommendation for DC plan and follow-up therapies;Outpatient PT(has outpt PT appointment on Monday 01/09/18)    Equipment Recommendations  3in1 (PT)    Recommendations for Other Services       Precautions / Restrictions Precautions Precautions: Knee Precaution Comments: reviewed no pillow under knee Restrictions Weight Bearing Restrictions: No Other Position/Activity Restrictions: WBAT      Mobility  Bed Mobility Overal bed mobility: Modified Independent             General bed mobility comments: HOB up  Transfers Overall transfer level: Needs assistance Equipment used: Rolling walker (2 wheeled) Transfers: Sit to/from Stand Sit to Stand: From elevated surface;Min assist         General transfer comment: min A to rise from elevated bed, VCs hand placement  Ambulation/Gait Ambulation/Gait assistance: Min guard Ambulation Distance (Feet): 40 Feet Assistive device: Rolling walker (2 wheeled) Gait Pattern/deviations: Step-to pattern;Step-through pattern;Decreased step length - right;Decreased step length - left Gait velocity: decr   General Gait Details: ambulated with B AFOs, VCs sequencing, no LOB  Stairs            Wheelchair Mobility    Modified Rankin (Stroke Patients Only)       Balance Overall balance assessment: Needs assistance   Sitting balance-Leahy Scale: Good     Standing balance support:  Bilateral upper extremity supported Standing balance-Leahy Scale: Poor                               Pertinent Vitals/Pain Pain Assessment: No/denies pain    Home Living Family/patient expects to be discharged to:: Private residence Living Arrangements: Spouse/significant other Available Help at Discharge: Family;Available 24 hours/day Type of Home: House Home Access: Stairs to enter Entrance Stairs-Rails: None Entrance Stairs-Number of Steps: 1 Home Layout: Two level;Able to live on main level with bedroom/bathroom Home Equipment: Gilford Rile - 2 wheels;Cane - single point;Shower seat - built in      Prior Function Level of Independence: Independent with assistive device(s)         Comments: uses B AFOs, walks with SPC     Hand Dominance        Extremity/Trunk Assessment   Upper Extremity Assessment Upper Extremity Assessment: Overall WFL for tasks assessed    Lower Extremity Assessment Lower Extremity Assessment: RLE deficits/detail RLE Deficits / Details: 10-60* AAROM R knee, trace ankle DF AROM BLEs -this is baseline; SLR 3/5, knee ext -3/5    Cervical / Trunk Assessment Cervical / Trunk Assessment: Normal  Communication   Communication: No difficulties  Cognition Arousal/Alertness: Awake/alert Behavior During Therapy: WFL for tasks assessed/performed Overall Cognitive Status: Within Functional Limits for tasks assessed                                        General Comments  Exercises Total Joint Exercises Ankle Circles/Pumps: AROM;Both;10 reps;Supine Quad Sets: AROM;Right;10 reps;Supine Heel Slides: AAROM;Right;10 reps;Supine Straight Leg Raises: AROM;5 reps;Right Long Arc Quad: AROM;Right;5 reps;Seated Goniometric ROM: 10-60* AAROM R knee   Assessment/Plan    PT Assessment Patient needs continued PT services  PT Problem List Decreased strength;Decreased range of motion;Decreased activity tolerance;Decreased  knowledge of use of DME       PT Treatment Interventions DME instruction;Gait training;Therapeutic activities;Therapeutic exercise;Patient/family education    PT Goals (Current goals can be found in the Care Plan section)  Acute Rehab PT Goals Patient Stated Goal: to walk at [facility] PT Goal Formulation: With patient/family Time For Goal Achievement: 01/10/18 Potential to Achieve Goals: Good    Frequency 7X/week   Barriers to discharge        Co-evaluation               AM-PAC PT "6 Clicks" Daily Activity  Outcome Measure Difficulty turning over in bed (including adjusting bedclothes, sheets and blankets)?: A Little Difficulty moving from lying on back to sitting on the side of the bed? : A Little Difficulty sitting down on and standing up from a chair with arms (e.g., wheelchair, bedside commode, etc,.)?: Unable Help needed moving to and from a bed to chair (including a wheelchair)?: A Little Help needed walking in hospital room?: A Little Help needed climbing 3-5 steps with a railing? : A Lot 6 Click Score: 15    End of Session Equipment Utilized During Treatment: Gait belt;Other (comment)(B AFOs) Activity Tolerance: Patient tolerated treatment well Patient left: in chair;with call bell/phone within reach;with chair alarm set;with family/visitor present Nurse Communication: Mobility status PT Visit Diagnosis: Muscle weakness (generalized) (M62.81);Difficulty in walking, not elsewhere classified (R26.2);Other abnormalities of gait and mobility (R26.89)    Time: 3244-0102 PT Time Calculation (min) (ACUTE ONLY): 36 min   Charges:   PT Evaluation $PT Eval Low Complexity: 1 Low PT Treatments $Gait Training: 8-22 mins   PT G Codes:          Philomena Doheny 01/03/2018, 4:53 PM [phone]

## 2018-01-03 NOTE — Progress Notes (Signed)
AssistedDr. Marcie Bal with right, ultrasound guided, adductor canal block. Side rails up, monitors on throughout procedure. See vital signs in flow sheet. Tolerated Procedure well, pt awake, no complaints of pain, pt calm. No sedation used, did ask Dr. Marcie Bal if he wanted me to give the versed and fentanyl and he states "no, I think he will be fine".  MD numbed the block site also.

## 2018-01-03 NOTE — Anesthesia Procedure Notes (Signed)
Spinal  Start time: 01/03/2018 10:10 AM End time: 01/03/2018 10:15 AM Staffing Resident/CRNA: Victoriano Lain, CRNA Preanesthetic Checklist Completed: patient identified, site marked, surgical consent, pre-op evaluation, timeout performed, IV checked, risks and benefits discussed and monitors and equipment checked Spinal Block Patient position: sitting Prep: ChloraPrep and site prepped and draped Patient monitoring: heart rate, continuous pulse ox and blood pressure Approach: midline Location: L3-4 Injection technique: single-shot Needle Needle type: Pencan  Needle gauge: 24 G Needle length: 10 cm Assessment Sensory level: T4 Additional Notes Pt placed in sitting position. Spinal kit expiration date checked and verified. One attempt by CRNA. +CSF, - heme. Pt tolerated well.

## 2018-01-03 NOTE — Interval H&P Note (Signed)
History and Physical Interval Note:  01/03/2018 9:04 AM  Brandon Ray  has presented today for surgery, with the diagnosis of Right knee osteoarthritis  The various methods of treatment have been discussed with the patient and family. After consideration of risks, benefits and other options for treatment, the patient has consented to  Procedure(s): RIGHT TOTAL KNEE ARTHROPLASTY (Right) as a surgical intervention .  The patient's history has been reviewed, patient examined, no change in status, stable for surgery.  I have reviewed the patient's chart and labs.  Questions were answered to the patient's satisfaction.     [name]

## 2018-01-03 NOTE — Plan of Care (Signed)
Plan of care 

## 2018-01-03 NOTE — Anesthesia Preprocedure Evaluation (Signed)
Anesthesia Evaluation  Patient identified by MRN, date of birth, ID band Patient awake    Reviewed: Allergy & Precautions, H&P , NPO status , Patient's Chart, lab work & pertinent test results  Airway Mallampati: II   Neck ROM: full    Dental   Pulmonary former smoker,    breath sounds clear to auscultation       Cardiovascular + CAD and + CABG   Rhythm:regular Rate:Normal     Neuro/Psych Seizures -,   Neuromuscular disease    GI/Hepatic   Endo/Other    Renal/GU      Musculoskeletal  (+) Arthritis ,   Abdominal   Peds  Hematology   Anesthesia Other Findings   Reproductive/Obstetrics                             Anesthesia Physical Anesthesia Plan  ASA: III  Anesthesia Plan: Spinal and MAC   Post-op Pain Management:  Regional for Post-op pain   Induction: Intravenous  PONV Risk Score and Plan: 1 and Ondansetron, Propofol infusion and Treatment may vary due to age or medical condition  Airway Management Planned: Simple Face Mask  Additional Equipment:   Intra-op Plan:   Post-operative Plan:   Informed Consent: I have reviewed the patients History and Physical, chart, labs and discussed the procedure including the risks, benefits and alternatives for the proposed anesthesia with the patient or authorized representative who has indicated his/her understanding and acceptance.     Plan Discussed with: CRNA, Anesthesiologist and Surgeon  Anesthesia Plan Comments:         Anesthesia Quick Evaluation

## 2018-01-03 NOTE — Transfer of Care (Signed)
Immediate Anesthesia Transfer of Care Note  Patient: Brandon Ray  Procedure(s) Performed: RIGHT TOTAL KNEE ARTHROPLASTY (Right Knee)  Patient Location: PACU  Anesthesia Type:Regional and Spinal  Level of Consciousness: awake, alert , oriented and patient cooperative  Airway & Oxygen Therapy: Patient Spontanous Breathing and Patient connected to face mask oxygen  Post-op Assessment: Report given to RN and Post -op Vital signs reviewed and stable  Post vital signs: Reviewed and stable  Last Vitals:  Vitals Value Taken Time  BP 94/53 01/03/2018 12:20 PM  Temp    Pulse 55 01/03/2018 12:21 PM  Resp 9 01/03/2018 12:21 PM  SpO2 100 % 01/03/2018 12:21 PM  Vitals shown include unvalidated device data.  Last Pain:  Vitals:   01/03/18 0939  TempSrc:   PainSc: 0-No pain         Complications: No apparent anesthesia complications

## 2018-01-03 NOTE — Anesthesia Procedure Notes (Signed)
Anesthesia Regional Block: Adductor canal block   Pre-Anesthetic Checklist: ,, timeout performed, Correct Patient, Correct Site, Correct Laterality, Correct Procedure, Correct Position, site marked, Risks and benefits discussed,  Surgical consent,  Pre-op evaluation,  At surgeon's request and post-op pain management  Laterality: Right  Prep: chloraprep       Needles:  Injection technique: Single-shot  Needle Type: Echogenic Needle     Needle Length: 9cm  Needle Gauge: 21     Additional Needles:   Narrative:  Start time: 01/03/2018 9:32 AM End time: 01/03/2018 9:38 AM Injection made incrementally with aspirations every 5 mL.  Performed by: Personally  Anesthesiologist: Albertha Ghee, MD  Additional Notes: Pt tolerated the procedure well.

## 2018-01-03 NOTE — Anesthesia Postprocedure Evaluation (Signed)
Anesthesia Post Note  Patient: Brandon Ray  Procedure(s) Performed: RIGHT TOTAL KNEE ARTHROPLASTY (Right Knee)     Patient location during evaluation: PACU Anesthesia Type: MAC Level of consciousness: oriented and awake and alert Pain management: pain level controlled Vital Signs Assessment: post-procedure vital signs reviewed and stable Respiratory status: spontaneous breathing, respiratory function stable and patient connected to nasal cannula oxygen Cardiovascular status: blood pressure returned to baseline and stable Postop Assessment: no headache, no backache and no apparent nausea or vomiting Anesthetic complications: no    Last Vitals:  Vitals:   01/03/18 1400 01/03/18 1517  BP: 127/75 138/71  Pulse: (!) 54 (!) 56  Resp: 14 14  Temp: (!) 36.4 C   SpO2: 99% 96%    Last Pain:  Vitals:   01/03/18 1417  TempSrc:   PainSc: 0-No pain                 Opie Fanton S

## 2018-01-04 DIAGNOSIS — E663 Overweight: Secondary | ICD-10-CM | POA: Diagnosis present

## 2018-01-04 DIAGNOSIS — K56699 Other intestinal obstruction unspecified as to partial versus complete obstruction: Secondary | ICD-10-CM | POA: Diagnosis not present

## 2018-01-04 LAB — CBC
HEMATOCRIT: 40.7 % (ref 39.0–52.0)
Hemoglobin: 13.8 g/dL (ref 13.0–17.0)
MCH: 31.9 pg (ref 26.0–34.0)
MCHC: 33.9 g/dL (ref 30.0–36.0)
MCV: 94 fL (ref 78.0–100.0)
PLATELETS: 185 10*3/uL (ref 150–400)
RBC: 4.33 MIL/uL (ref 4.22–5.81)
RDW: 14.3 % (ref 11.5–15.5)
WBC: 8.7 10*3/uL (ref 4.0–10.5)

## 2018-01-04 LAB — BASIC METABOLIC PANEL
Anion gap: 7 (ref 5–15)
BUN: 15 mg/dL (ref 6–20)
CHLORIDE: 99 mmol/L — AB (ref 101–111)
CO2: 27 mmol/L (ref 22–32)
Calcium: 8 mg/dL — ABNORMAL LOW (ref 8.9–10.3)
Creatinine, Ser: 0.83 mg/dL (ref 0.61–1.24)
GFR calc Af Amer: >60 mL/min (ref >60)
GLUCOSE: 106 mg/dL — AB (ref 65–99)
POTASSIUM: 4.5 mmol/L (ref 3.5–5.1)
Sodium: 133 mmol/L — ABNORMAL LOW (ref 135–145)

## 2018-01-04 MED ORDER — HYDROCODONE-ACETAMINOPHEN 7.5-325 MG PO TABS: 1.0000 | ORAL_TABLET | ORAL | 0 refills | Status: DC | PRN

## 2018-01-04 MED ORDER — FERROUS SULFATE 325 (65 FE) MG PO TABS: 325.0000 mg | ORAL_TABLET | Freq: Three times a day (TID) | ORAL | 3 refills | Status: DC

## 2018-01-04 MED ORDER — ASPIRIN 81 MG PO CHEW: 81.0000 mg | CHEWABLE_TABLET | Freq: Two times a day (BID) | ORAL | 0 refills | Status: AC

## 2018-01-04 MED ORDER — DOCUSATE SODIUM 100 MG PO CAPS: 100.0000 mg | ORAL_CAPSULE | Freq: Two times a day (BID) | ORAL | 0 refills | Status: DC

## 2018-01-04 MED ORDER — POLYETHYLENE GLYCOL 3350 17 G PO PACK: 17.0000 g | PACK | Freq: Two times a day (BID) | ORAL | 0 refills | Status: DC

## 2018-01-04 MED ORDER — METHOCARBAMOL 500 MG PO TABS: 500.0000 mg | ORAL_TABLET | Freq: Four times a day (QID) | ORAL | 0 refills | Status: DC | PRN

## 2018-01-04 NOTE — Care Management Obs Status (Signed)
[place] NOTIFICATION   Patient Details  Name: Brandon Ray MRN: [id number] Date of Birth: Dec 14, 1934   Medicare Observation Status Notification Given:  Yes  Patient declines signature, copy given to patient.  Guadalupe Maple, RN 01/04/2018, 1:54 PM

## 2018-01-04 NOTE — Progress Notes (Signed)
     Subjective: 1 Day Post-Op Procedure(s) (LRB): RIGHT TOTAL KNEE ARTHROPLASTY (Right)   Patient reports pain as mild, pain controlled.   No events throughout the night.  Discussed his post-op medications and regimen.  Ready to be discharged home.  Objective:   VITALS:   Vitals:   01/03/18 2315 01/04/18 0447  BP: 131/68 116/62  Pulse: 66 (!) 58  Resp: 14 15  Temp: (!) 97.5 F (36.4 C) 97.7 F (36.5 C)  SpO2: 99% 98%    Dorsiflexion/Plantar flexion intact Incision: dressing C/D/I No cellulitis present Compartment soft  LABS Recent Labs    01/04/18 0529  HGB 13.8  HCT 40.7  WBC 8.7  PLT 185    Recent Labs    01/04/18 0529  NA 133*  K 4.5  BUN 15  CREATININE 0.83  GLUCOSE 106*     Assessment/Plan: 1 Day Post-Op Procedure(s) (LRB): RIGHT TOTAL KNEE ARTHROPLASTY (Right) Foley cath d/c'ed Advance diet Up with therapy D/C IV fluids Discharge home Follow up in 2 weeks at [facility]. Follow up with OLIN,Madalina Rosman D in 2 weeks.  Contact information:  [facility] [address] [place] [phone]      Overweight (BMI 25-29.9) Estimated body mass index is 26.39 kg/m as calculated from the following:   Height as of this encounter: $RemoveBeforeD'6\' 1"'[id number]$  (1.854 m).   Weight as of this encounter: 90.7 kg (200 lb). Patient also counseled that weight may inhibit the healing process Patient counseled that losing weight will help with future health issues       [place] Pugh. Shiela Bruns   PAC  01/04/2018, 8:52 AM

## 2018-01-04 NOTE — Progress Notes (Signed)
Physical Therapy Treatment Patient Details Name: Brandon Ray MRN: [id number] DOB: 05/09/1935 Today's Date: 01/04/2018    History of Present Illness R TKA; h/o B foot drop (has AFOs)    PT Comments    Pt has met PT goals and is ready to DC home from PT standpoint. He ambulated 200' with RW, demonstrates understanding of HEP and completed stair training. He is independent with SLR, AAROM R knee 10-95*.   Follow Up Recommendations  Follow surgeon's recommendation for DC plan and follow-up therapies;Outpatient PT(has outpt PT appointment on Monday 01/09/18)     Equipment Recommendations  3in1 (PT)    Recommendations for Other Services       Precautions / Restrictions Precautions Precautions: Knee Precaution Comments: reviewed no pillow under knee Restrictions Weight Bearing Restrictions: No Other Position/Activity Restrictions: WBAT    Mobility  Bed Mobility Overal bed mobility: Modified Independent             General bed mobility comments: HOB up  Transfers Overall transfer level: Needs assistance Equipment used: Rolling walker (2 wheeled) Transfers: Sit to/from Stand Sit to Stand: From elevated surface;Min guard         General transfer comment: , VCs hand placement  Ambulation/Gait Ambulation/Gait assistance: Supervision Ambulation Distance (Feet): 200 Feet Assistive device: Rolling walker (2 wheeled) Gait Pattern/deviations: Step-to pattern;Step-through pattern;Decreased stride length Gait velocity: decr   General Gait Details: ambulated with B AFOs, VCs sequencing, no LOB   Stairs Stairs: Yes Stairs assistance: Min guard Stair Management: No rails;Backwards;Step to pattern;With walker Number of Stairs: 1 General stair comments: 1 step x 2 trials, VCs sequencing, wife present for stair training   Wheelchair Mobility    Modified Rankin (Stroke Patients Only)       Balance Overall balance assessment: Needs assistance   Sitting  balance-Leahy Scale: Good     Standing balance support: Bilateral upper extremity supported Standing balance-Leahy Scale: Fair                              Cognition Arousal/Alertness: Awake/alert Behavior During Therapy: WFL for tasks assessed/performed Overall Cognitive Status: Within Functional Limits for tasks assessed                                        Exercises Total Joint Exercises Ankle Circles/Pumps: AROM;Both;10 reps;Supine Quad Sets: AROM;Right;10 reps;Supine Towel Squeeze: AROM;5 reps;Supine Short Arc Quad: AROM;Right;10 reps;Supine Heel Slides: AAROM;Right;10 reps;Supine Hip ABduction/ADduction: AROM;10 reps;Right Straight Leg Raises: AROM;Right;10 reps Long Arc Quad: AROM;Right;Seated;10 reps Knee Flexion: AROM;AAROM;Right;10 reps Goniometric ROM: 10-95* AAROM R knee    General Comments        Pertinent Vitals/Pain Pain Assessment: 0-10 Pain Score: 5  Pain Location: R knee Pain Descriptors / Indicators: Sore Pain Intervention(s): Limited activity within patient's tolerance;Monitored during session;Premedicated before session;Ice applied    Home Living                      Prior Function            PT Goals (current goals can now be found in the care plan section) Acute Rehab PT Goals Patient Stated Goal: to walk at [place] PT Goal Formulation: With patient/family Time For Goal Achievement: 01/10/18 Potential to Achieve Goals: Good Progress towards PT goals: Progressing toward goals    Frequency      7X/week      PT Plan Current plan remains appropriate    Co-evaluation              AM-PAC PT "6 Clicks" Daily Activity  Outcome Measure  Difficulty turning over in bed (including adjusting bedclothes, sheets and blankets)?: None Difficulty moving from lying on back to sitting on the side of the bed? : None Difficulty sitting down on and standing up from a chair with arms (e.g.,  wheelchair, bedside commode, etc,.)?: A Little Help needed moving to and from a bed to chair (including a wheelchair)?: A Little Help needed walking in hospital room?: A Little Help needed climbing 3-5 steps with a railing? : A Little 6 Click Score: 20    End of Session Equipment Utilized During Treatment: Gait belt;Other (comment)(B AFOs) Activity Tolerance: Patient tolerated treatment well Patient left: in chair;with call bell/phone within reach;with family/visitor present Nurse Communication: Mobility status PT Visit Diagnosis: Muscle weakness (generalized) (M62.81);Difficulty in walking, not elsewhere classified (R26.2);Other abnormalities of gait and mobility (R26.89)     Time: 1011-1104 PT Time Calculation (min) (ACUTE ONLY): 53 min  Charges:  $Gait Training: 23-37 mins $Therapeutic Exercise: 23-37 mins                    G Codes:          Uhlenberg, Jennifer Kistler 01/04/2018, 11:10 AM [phone]  

## 2018-01-05 DIAGNOSIS — E785 Hyperlipidemia, unspecified: Secondary | ICD-10-CM | POA: Diagnosis not present

## 2018-01-05 DIAGNOSIS — H3530 Unspecified macular degeneration: Secondary | ICD-10-CM | POA: Diagnosis present

## 2018-01-05 DIAGNOSIS — M1711 Unilateral primary osteoarthritis, right knee: Secondary | ICD-10-CM | POA: Diagnosis not present

## 2018-01-05 DIAGNOSIS — I2510 Atherosclerotic heart disease of native coronary artery without angina pectoris: Secondary | ICD-10-CM | POA: Diagnosis not present

## 2018-01-05 DIAGNOSIS — Z887 Allergy status to serum and vaccine status: Secondary | ICD-10-CM | POA: Diagnosis not present

## 2018-01-05 DIAGNOSIS — Z6825 Body mass index (BMI) 25.0-25.9, adult: Secondary | ICD-10-CM | POA: Diagnosis not present

## 2018-01-05 DIAGNOSIS — E663 Overweight: Secondary | ICD-10-CM | POA: Diagnosis present

## 2018-01-05 DIAGNOSIS — N319 Neuromuscular dysfunction of bladder, unspecified: Secondary | ICD-10-CM | POA: Diagnosis not present

## 2018-01-05 DIAGNOSIS — N400 Enlarged prostate without lower urinary tract symptoms: Secondary | ICD-10-CM | POA: Diagnosis not present

## 2018-01-05 DIAGNOSIS — M25561 Pain in right knee: Secondary | ICD-10-CM | POA: Diagnosis present

## 2018-01-05 DIAGNOSIS — R339 Retention of urine, unspecified: Secondary | ICD-10-CM | POA: Diagnosis not present

## 2018-01-05 DIAGNOSIS — Z825 Family history of asthma and other chronic lower respiratory diseases: Secondary | ICD-10-CM | POA: Diagnosis not present

## 2018-01-05 DIAGNOSIS — G629 Polyneuropathy, unspecified: Secondary | ICD-10-CM | POA: Diagnosis not present

## 2018-01-05 DIAGNOSIS — D472 Monoclonal gammopathy: Secondary | ICD-10-CM | POA: Diagnosis not present

## 2018-01-05 DIAGNOSIS — Z951 Presence of aortocoronary bypass graft: Secondary | ICD-10-CM | POA: Diagnosis not present

## 2018-01-05 DIAGNOSIS — Z7982 Long term (current) use of aspirin: Secondary | ICD-10-CM | POA: Diagnosis not present

## 2018-01-05 DIAGNOSIS — Z809 Family history of malignant neoplasm, unspecified: Secondary | ICD-10-CM | POA: Diagnosis not present

## 2018-01-05 DIAGNOSIS — Z8249 Family history of ischemic heart disease and other diseases of the circulatory system: Secondary | ICD-10-CM | POA: Diagnosis not present

## 2018-01-05 DIAGNOSIS — Z881 Allergy status to other antibiotic agents status: Secondary | ICD-10-CM | POA: Diagnosis not present

## 2018-01-05 DIAGNOSIS — Z87891 Personal history of nicotine dependence: Secondary | ICD-10-CM | POA: Diagnosis not present

## 2018-01-05 DIAGNOSIS — M810 Age-related osteoporosis without current pathological fracture: Secondary | ICD-10-CM | POA: Diagnosis not present

## 2018-01-05 DIAGNOSIS — G6181 Chronic inflammatory demyelinating polyneuritis: Secondary | ICD-10-CM | POA: Diagnosis not present

## 2018-01-05 DIAGNOSIS — Z9079 Acquired absence of other genital organ(s): Secondary | ICD-10-CM | POA: Diagnosis not present

## 2018-01-05 LAB — BASIC METABOLIC PANEL
ANION GAP: 7 (ref 5–15)
BUN: 11 mg/dL (ref 6–20)
CALCIUM: 8.2 mg/dL — AB (ref 8.9–10.3)
CO2: 26 mmol/L (ref 22–32)
Chloride: 97 mmol/L — ABNORMAL LOW (ref 101–111)
Creatinine, Ser: 0.65 mg/dL (ref 0.61–1.24)
Glucose, Bld: 99 mg/dL (ref 65–99)
POTASSIUM: 4.4 mmol/L (ref 3.5–5.1)
Sodium: 130 mmol/L — ABNORMAL LOW (ref 135–145)

## 2018-01-05 LAB — CBC
HEMATOCRIT: 37.8 % — AB (ref 39.0–52.0)
Hemoglobin: 13 g/dL (ref 13.0–17.0)
MCH: 31.7 pg (ref 26.0–34.0)
MCHC: 34.4 g/dL (ref 30.0–36.0)
MCV: 92.2 fL (ref 78.0–100.0)
PLATELETS: 176 10*3/uL (ref 150–400)
RBC: 4.1 MIL/uL — AB (ref 4.22–5.81)
RDW: 14.1 % (ref 11.5–15.5)
WBC: 7.4 10*3/uL (ref 4.0–10.5)

## 2018-01-05 NOTE — Progress Notes (Signed)
Physical Therapy Treatment Patient Details Name: Brandon Ray MRN: [id number] DOB: [birth year] Today's Date: 01/05/2018    History of Present Illness R TKA; h/o B foot drop (has AFOs)    PT Comments    Pt ambulated 240' with RW, no loss of balance. Reviewed HEP, he demonstrates good understanding. He is ready to DC home from PT standpoint.   Follow Up Recommendations  Follow surgeon's recommendation for DC plan and follow-up therapies;Outpatient PT(has outpt PT appointment on Monday 01/09/18)     Equipment Recommendations  3in1 (PT)    Recommendations for Other Services       Precautions / Restrictions Precautions Precautions: Knee Precaution Comments: reviewed no pillow under knee Restrictions Weight Bearing Restrictions: No Other Position/Activity Restrictions: WBAT    Mobility  Bed Mobility Overal bed mobility: Modified Independent             General bed mobility comments: HOB up  Transfers Overall transfer level: Needs assistance Equipment used: Rolling walker (2 wheeled) Transfers: Sit to/from Stand Sit to Stand: From elevated surface;Min guard         General transfer comment: , VCs hand placement  Ambulation/Gait Ambulation/Gait assistance: Supervision Ambulation Distance (Feet): 260 Feet Assistive device: Rolling walker (2 wheeled) Gait Pattern/deviations: Step-to pattern;Step-through pattern;Decreased stride length Gait velocity: decr   General Gait Details: ambulated with B AFOs, no LOB   Stairs             Wheelchair Mobility    Modified Rankin (Stroke Patients Only)       Balance Overall balance assessment: Needs assistance   Sitting balance-Leahy Scale: Good     Standing balance support: Bilateral upper extremity supported Standing balance-Leahy Scale: Fair                              Cognition Arousal/Alertness: Awake/alert Behavior During Therapy: WFL for tasks assessed/performed Overall  Cognitive Status: Within Functional Limits for tasks assessed                                        Exercises Total Joint Exercises Ankle Circles/Pumps: Both;10 reps;Supine;AAROM Quad Sets: AROM;Right;10 reps;Supine Short Arc Quad: AROM;Right;10 reps;Supine Heel Slides: AAROM;Right;10 reps;Supine Hip ABduction/ADduction: AROM;10 reps;Right Straight Leg Raises: AROM;Right;10 reps Knee Flexion: AROM;AAROM;Right;10 reps Goniometric ROM: 5-100* AAROM R knee    General Comments        Pertinent Vitals/Pain Pain Score: 4  Pain Location: R knee Pain Descriptors / Indicators: Sore Pain Intervention(s): Limited activity within patient's tolerance;Monitored during session;Premedicated before session;Ice applied    Home Living                      Prior Function            PT Goals (current goals can now be found in the care plan section) Acute Rehab PT Goals Patient Stated Goal: to walk at [facility] PT Goal Formulation: With patient/family Time For Goal Achievement: 01/10/18 Potential to Achieve Goals: Good Progress towards PT goals: Progressing toward goals    Frequency    7X/week      PT Plan Current plan remains appropriate    Co-evaluation              AM-PAC PT "6 Clicks" Daily Activity  Outcome Measure  Difficulty turning over in bed (including adjusting bedclothes,  sheets and blankets)?: None Difficulty moving from lying on back to sitting on the side of the bed? : None Difficulty sitting down on and standing up from a chair with arms (e.g., wheelchair, bedside commode, etc,.)?: A Little Help needed moving to and from a bed to chair (including a wheelchair)?: A Little Help needed walking in hospital room?: A Little Help needed climbing 3-5 steps with a railing? : A Little 6 Click Score: 20    End of Session Equipment Utilized During Treatment: Gait belt;Other (comment)(B AFOs) Activity Tolerance: Patient tolerated  treatment well Patient left: in chair;with call bell/phone within reach;with family/visitor present Nurse Communication: Mobility status PT Visit Diagnosis: Muscle weakness (generalized) (M62.81);Difficulty in walking, not elsewhere classified (R26.2);Other abnormalities of gait and mobility (R26.89)     Time: 8588-5027 PT Time Calculation (min) (ACUTE ONLY): 25 min  Charges:  $Gait Training: 8-22 mins $Therapeutic Exercise: 8-22 mins                    G Codes:          Philomena Doheny 01/05/2018, 10:51 AM [phone]

## 2018-01-05 NOTE — Progress Notes (Signed)
     Subjective: 2 Days Post-Op Procedure(s) (LRB): RIGHT TOTAL KNEE ARTHROPLASTY (Right)   Patient reports pain as mild, pain controlled. Difficulty voiding yesterday, last night was able to void and had no difficulty voiding this AM. Patient already is on Flomax and states that he has had issues previously with regars to urination after surgeries.  Ready to be discharged home.  Objective:   VITALS:   Vitals:   01/04/18 2130 01/05/18 0543  BP: 132/68 131/66  Pulse: 73 (!) 59  Resp: 18 17  Temp: 97.8 F (36.6 C) 98.3 F (36.8 C)  SpO2: 97% 98%    Dorsiflexion/Plantar flexion intact Incision: dressing C/D/I No cellulitis present Compartment soft  LABS Recent Labs    01/04/18 0529 01/05/18 0537  HGB 13.8 13.0  HCT 40.7 37.8*  WBC 8.7 7.4  PLT 185 176    Recent Labs    01/04/18 0529 01/05/18 0537  NA 133* 130*  K 4.5 4.4  BUN 15 11  CREATININE 0.83 0.65  GLUCOSE 106* 99     Assessment/Plan: 2 Days Post-Op Procedure(s) (LRB): RIGHT TOTAL KNEE ARTHROPLASTY (Right) Up with therapy Discharge home Follow up in 2 weeks at [facility]. Follow up with OLIN,Lula Michaux D in 2 weeks.  Contact information:  [facility] [address] [place] Brandon Ray   PAC  01/05/2018, 7:27 AM

## 2018-01-05 NOTE — Progress Notes (Signed)
RN reviewed discharge instructions with patient and wife. All questions answered.   Paperwork given to wife. Prescriptions had been given to the family yesterday as patient's discharge was held secondary to voiding issues. Patient is now voiding without any difficulty, and therefore has been cleared for discharge.   NT rolled patient down with all belongings to family car.

## 2018-01-09 DIAGNOSIS — M25561 Pain in right knee: Secondary | ICD-10-CM | POA: Diagnosis not present

## 2018-01-09 NOTE — Discharge Summary (Signed)
Physician Discharge Summary  Patient ID: STARSKY NANNA MRN: [id number] DOB/AGE: Mar 30, 1935 83 y.o.  Admit date: 01/03/2018 Discharge date: 01/05/2018   Procedures:  Procedure(s) (LRB): RIGHT TOTAL KNEE ARTHROPLASTY (Right)  Attending Physician:  Dr. Paralee Cancel   Admission Diagnoses:   Right knee primary OA / pain  Discharge Diagnoses:  Principal Problem:   S/P right TKA Active Problems:   S/P total knee replacement   Overweight (BMI 25.0-29.9)  Past Medical History:  Diagnosis Date  . Arthritis   . BPH (benign prostatic hypertrophy) 2014  . Coronary artery disease 01/2003   CABG  . Free monoclonal light chain    urine  . History of blood transfusion 2004  . Hyperlipidemia    on medication  . Macular degeneration    both eyes  . Peripheral neuropathy 05/2011    follow up with Dr. Tish Frederickson multiple issues  . Pleural effusion 05/2011   from abdominal surgery  . Seizures ([place]) 1950's   last one 1961  . Small bowel obstruction ([place]) 05/2011   due to adhesion  . Weakness 2013-May began   lower  from neuropathy from knees down    HPI:    [name], 83 y.o. male, has a history of pain and functional disability in the right knee due to arthritis and has failed non-surgical conservative treatments for greater than 12 weeks to include NSAID's and/or analgesics, corticosteriod injections, use of assistive devices and activity modification.  Onset of symptoms was gradual, starting 1.5+ years ago with gradually worsening course since that time. The patient noted prior procedures on the knee to include  arthroscopy on the right knee(s).  Patient currently rates pain in the right knee(s) at 8 out of 10 with activity. Patient has night pain, worsening of pain with activity and weight bearing, pain that interferes with activities of daily living, pain with passive range of motion, crepitus and joint swelling.  Patient has evidence of periarticular osteophytes and joint space  narrowing by imaging studies. There is no active infection.   Risks, benefits and expectations were discussed with the patient.  Risks including but not limited to the risk of anesthesia, blood clots, nerve damage, blood vessel damage, failure of the prosthesis, infection and up to and including death.  Patient understand the risks, benefits and expectations and wishes to proceed with surgery.   PCP: Hulan Fess, MD   Discharged Condition: good  Hospital Course:  Patient underwent the above stated procedure on 01/03/2018. Patient tolerated the procedure well and brought to the recovery room in good condition and subsequently to the floor.  POD #1 BP: 116/62 ; Pulse: 58 ; Temp: 97.7 F (36.5 C) ; Resp: 15 Patient reports pain as mild, pain controlled.   No events throughout the night.  Discussed his post-op medications and regimen. Patient had urinary retention and was kept overnight to see of this would clear. Dorsiflexion/plantar flexion intact, incision: dressing C/D/I, no cellulitis present and compartment soft.   LABS  Basename    HGB     13.8  HCT     40.7   POD #2  BP: 131/66 ; Pulse: 59 ; Temp: 98.3 F (36.8 C) ; Resp: 17 Patient reports pain as mild, pain controlled. Difficulty voiding yesterday, last night was able to void and had no difficulty voiding this AM. Patient already is on Flomax and states that he has had issues previously with regars to urination after surgeries.  Ready to be discharged home. Dorsiflexion/plantar  flexion intact, incision: dressing C/D/I, no cellulitis present and compartment soft.   LABS  Basename    HGB     13.0  HCT     37.8    Discharge Exam: General appearance: alert, cooperative and mild distress Extremities: Homans sign is negative, no sign of DVT, no edema, redness or tenderness in the calves or thighs and no ulcers, gangrene or trophic changes  Disposition:  Home with follow up in 2 weeks   Follow-up Information    Paralee Cancel, MD.  Schedule an appointment as soon as possible for a visit in 2 weeks.   Specialty:  Orthopedic Surgery Contact information: [address] [place] [address] [phone]           Discharge Instructions    Call MD / Call 911   Complete by:  As directed    If you experience chest pain or shortness of breath, CALL 911 and be transported to the hospital emergency room.  If you develope a fever above 101 F, pus (white drainage) or increased drainage or redness at the wound, or calf pain, call your surgeon's office.   Change dressing   Complete by:  As directed    Maintain surgical dressing until follow up in the clinic. If the edges start to pull up, may reinforce with tape. If the dressing is no longer working, may remove and cover with gauze and tape, but must keep the area dry and clean.  Call with any questions or concerns.   Constipation Prevention   Complete by:  As directed    Drink plenty of fluids.  Prune juice may be helpful.  You may use a stool softener, such as Colace (over the counter) 100 mg twice a day.  Use MiraLax (over the counter) for constipation as needed.   Diet - low sodium heart healthy   Complete by:  As directed    Discharge instructions   Complete by:  As directed    Maintain surgical dressing until follow up in the clinic. If the edges start to pull up, may reinforce with tape. If the dressing is no longer working, may remove and cover with gauze and tape, but must keep the area dry and clean.  Follow up in 2 weeks at [facility]. Call with any questions or concerns.   Increase activity slowly as tolerated   Complete by:  As directed    Weight bearing as tolerated with assist device (walker, cane, etc) as directed, use it as long as suggested by your surgeon or therapist, typically at least 4-6 weeks.   TED hose   Complete by:  As directed    Use stockings (TED hose) for 2 weeks on both leg(s).  You may remove them at night for  sleeping.      Allergies as of 01/05/2018      Reactions   Amoxicillin Anaphylaxis, Rash   Upper torso only. Has patient had a PCN reaction causing immediate rash, facial/tongue/throat swelling, SOB or lightheadedness with hypotension: No Has patient had a PCN reaction causing severe rash involving mucus membranes or skin necrosis: No Has patient had a PCN reaction that required hospitalization: No Has patient had a PCN reaction occurring within the last 10 years: Yes If all of the above answers are "NO", then may proceed with Cephalosporin use.   Boostrix [tetanus-diphth-acell Pertussis] Other (See Comments)   Neuropathy symptoms   Other Other (See Comments)   Flu shot-neuropathy symptoms  Medication List    STOP taking these medications   aspirin 81 MG tablet Replaced by:  aspirin 81 MG chewable tablet     TAKE these medications   aspirin 81 MG chewable tablet Commonly known as:  ASPIRIN CHILDRENS Chew 1 tablet (81 mg total) by mouth 2 (two) times daily. Take for 4 weeks, then resume regular dose. Replaces:  aspirin 81 MG tablet   atorvastatin 20 MG tablet Commonly known as:  LIPITOR TAKE 1 TABLET (20 MG TOTAL) BY MOUTH DAILY AT 6 PM. What changed:  when to take this   calcium-vitamin D 500-200 MG-UNIT tablet Commonly known as:  OSCAL WITH D Take 1 tablet by mouth daily at 2 PM.   docusate sodium 100 MG capsule Commonly known as:  COLACE Take 1 capsule (100 mg total) by mouth 2 (two) times daily.   ferrous sulfate 325 (65 FE) MG tablet Commonly known as:  FERROUSUL Take 1 tablet (325 mg total) by mouth 3 (three) times daily with meals.   Fish Oil 1200 MG Caps Take 1,200 mg by mouth at bedtime.   HYDROcodone-acetaminophen 7.5-325 MG tablet Commonly known as:  NORCO Take 1-2 tablets by mouth every 4 (four) hours as needed for moderate pain.   methocarbamol 500 MG tablet Commonly known as:  ROBAXIN Take 1 tablet (500 mg total) by mouth every 6 (six) hours  as needed for muscle spasms.   PHENobarbital 97.2 MG tablet Commonly known as:  LUMINAL Take 97.2 mg by mouth at bedtime.   polyethylene glycol packet Commonly known as:  MIRALAX / GLYCOLAX Take 17 g by mouth 2 (two) times daily. What changed:    when to take this  reasons to take this   pregabalin 75 MG capsule Commonly known as:  LYRICA Take 75 mg by mouth 4 (four) times daily.   PRESERVISION AREDS 2 PO Take 2 capsules by mouth at bedtime.   tamsulosin 0.4 MG Caps capsule Commonly known as:  FLOMAX Take 0.4 mg by mouth at bedtime.   testosterone 50 MG/5GM (1%) Gel Commonly known as:  ANDROGEL APPLY 1 PACKET TO SKIN EVERY DAY What changed:    how much to take  how to take this  when to take this  additional instructions   Vitamin D-3 5000 units Tabs Take 5,000 Units by mouth every evening.            Discharge Care Instructions  (From admission, onward)        Start     Ordered   01/04/18 0000  Change dressing    Comments:  Maintain surgical dressing until follow up in the clinic. If the edges start to pull up, may reinforce with tape. If the dressing is no longer working, may remove and cover with gauze and tape, but must keep the area dry and clean.  Call with any questions or concerns.   01/04/18 0857       Signed: [place] Pugh. Tabithia Stroder   PA-C  01/09/2018, 9:49 AM

## 2018-01-11 ENCOUNTER — Encounter: Payer: Self-pay | Admitting: Cardiology

## 2018-01-11 DIAGNOSIS — M25561 Pain in right knee: Secondary | ICD-10-CM | POA: Diagnosis not present

## 2018-01-16 DIAGNOSIS — M25561 Pain in right knee: Secondary | ICD-10-CM | POA: Diagnosis not present

## 2018-01-18 DIAGNOSIS — M25561 Pain in right knee: Secondary | ICD-10-CM | POA: Diagnosis not present

## 2018-01-19 ENCOUNTER — Encounter: Payer: Self-pay | Admitting: Internal Medicine

## 2018-01-22 NOTE — Progress Notes (Signed)
Brandon Ray Date of Birth: Feb 15, 1935   History of Present Illness: Brandon Ray is seen today for followup. He has a history of coronary disease and is status post CABG in 2004 after a NSTEMI. He had severe 3 vessel disease and normal LV function. 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 This has significantly limited his activity.  He really denies any significant cardiac complaints. He denies chest pain, shortness of breath, or palpitations. He has had some edema over the past year but this became worse over the past week. Since he started using compression hose this has improved. No orthopnea or PND. He did have SBO in January 2018 that resolved with conservative measures.  He underwent left TKR in April 2019. This went well except for some urinary retention. He is progressing well with Rehab. No change in LE edema. No chest pain, dyspnea, palpitations, dizziness.   Current Outpatient Medications on File Prior to Visit  Medication Sig Dispense Refill  . aspirin (ASPIRIN CHILDRENS) 81 MG chewable tablet Chew 1 tablet (81 mg total) by mouth 2 (two) times daily. Take for 4 weeks, then resume regular dose. 60 tablet 0  . atorvastatin (LIPITOR) 20 MG tablet TAKE 1 TABLET (20 MG TOTAL) BY MOUTH DAILY AT 6 PM. (Patient taking differently: Take 20 mg by mouth at bedtime. ) 90 tablet 3  . calcium-vitamin D (OSCAL WITH D) 500-200 MG-UNIT per tablet Take 1 tablet by mouth daily at 2 PM.     . Cholecalciferol (VITAMIN D-3) 5000 units TABS Take 5,000 Units by mouth every evening.    . docusate sodium (COLACE) 100 MG capsule Take 1 capsule (100 mg total) by mouth 2 (two) times daily. 10 capsule 0  . HYDROcodone-acetaminophen (NORCO) 7.5-325 MG tablet Take 1-2 tablets by mouth every 4 (four) hours as needed for moderate pain. 60 tablet 0  . methocarbamol (ROBAXIN) 500 MG tablet Take 1 tablet (500 mg total) by mouth every 6 (six) hours as needed for muscle spasms. 40 tablet 0  .  Multiple Vitamins-Minerals (PRESERVISION AREDS 2 PO) Take 2 capsules by mouth at bedtime.     . Omega-3 Fatty Acids (FISH OIL) 1200 MG CAPS Take 1,200 mg by mouth at bedtime.     Marland Kitchen PHENobarbital (LUMINAL) 97.2 MG tablet Take 97.2 mg by mouth at bedtime.     . polyethylene glycol (MIRALAX / GLYCOLAX) packet Take 17 g by mouth 2 (two) times daily. 14 each 0  . pregabalin (LYRICA) 75 MG capsule Take 75 mg by mouth 4 (four) times daily.    . tamsulosin (FLOMAX) 0.4 MG CAPS capsule Take 0.4 mg by mouth at bedtime.     Marland Kitchen testosterone (ANDROGEL) 50 MG/5GM (1%) GEL APPLY 1 PACKET TO SKIN EVERY DAY (Patient taking differently: Place 5 g onto the skin See admin instructions. APPLY 1 PACKET TO SKIN 5 TIMES A WEEK.) 90 Package 2   No current facility-administered medications on file prior to visit.     Allergies  Allergen Reactions  . Amoxicillin Anaphylaxis and Rash    Upper torso only. Has patient had a PCN reaction causing immediate rash, facial/tongue/throat swelling, SOB or lightheadedness with hypotension: No Has patient had a PCN reaction causing severe rash involving mucus membranes or skin necrosis: No Has patient had a PCN reaction that required hospitalization: No Has patient had a PCN reaction occurring within the last 10 years: Yes If all of the above answers are "NO", then may proceed  with Cephalosporin use.   . Boostrix [Tetanus-Diphth-Acell Pertussis] Other (See Comments)    Neuropathy symptoms  . Other Other (See Comments)    Flu shot-neuropathy symptoms    Past Medical History:  Diagnosis Date  . Arthritis   . BPH (benign prostatic hypertrophy) 2014  . Coronary artery disease 01/2003   CABG  . Free monoclonal light chain    urine  . History of blood transfusion 2004  . Hyperlipidemia    on medication  . Macular degeneration    both eyes  . Peripheral neuropathy 05/2011    follow up with Dr. Tish Frederickson multiple issues  . Pleural effusion 05/2011   from abdominal surgery  .  Seizures ([place]) 1950's   last one 1961  . Small bowel obstruction ([place]) 05/2011   due to adhesion  . Weakness 2013-May began   lower  from neuropathy from knees down    Past Surgical History:  Procedure Laterality Date  . ABDOMINAL SURGERY  05/2011   bowel obstruction  . [place]   left  . CARDIAC CATHETERIZATION  01/27/2003   NORMAL LEFT VENTRICULAR SIZE WITH MILD FOCAL LATERAL WALL HYPOKINESIA. EF 60%  . CORONARY ARTERY BYPASS GRAFT  2004   LIMA GRAFT TO THE LAD, SAPHENOUS VEIN GRAFT TO THE DIAGONAL, SAPHENOUS VEIN GRAFT TO THE FIRST OM, SAPHENOUS VEIN GRAFT TO THE PDA  . HAND SURGERY  2000   left  . TOTAL KNEE ARTHROPLASTY Right 01/03/2018   Procedure: RIGHT TOTAL KNEE ARTHROPLASTY;  Surgeon: Paralee Cancel, MD;  Location: [facility];  Service: Orthopedics;  Laterality: Right;  . TRANSURETHRAL RESECTION OF PROSTATE  1992  . TRANSURETHRAL RESECTION OF PROSTATE N/A 12/22/2012   Procedure: TRANSURETHRAL RESECTION OF THE PROSTATE WITH GYRUS (TURP);  Surgeon: Fredricka Bonine, MD;  Location: [facility];  Service: Urology;  Laterality: N/A;    Social History   Tobacco Use  Smoking Status Former Smoker  . Packs/day: 1.00  . Years: 5.00  . Pack years: 5.00  . Types: Cigarettes  . Last attempt to quit: 07/01/1965  . Years since quitting: 52.6  Smokeless Tobacco Never Used    Social History   Substance and Sexual Activity  Alcohol Use No    Family History  Problem Relation Age of Onset  . Asthma Mother   . Heart disease Mother 60  . Coronary artery disease Father   . Heart disease Father   . Cancer Sister 79       breast    Review of Systems: As noted in history of present illness.  All other systems were reviewed and are negative.  Physical Exam: BP 108/68 (BP Location: Left Arm, Patient Position: Sitting, Cuff Size: Normal)   Pulse 99   Ht $R'6\' 1"'CO$  (1.854 m)   Wt 193 lb (87.5 kg)   BMI 25.46 kg/m  GENERAL:  Well appearing WM in  NAD HEENT:  PERRL, EOMI, sclera are clear. Oropharynx is clear. NECK:  No jugular venous distention, carotid upstroke brisk and symmetric, no bruits, no thyromegaly or adenopathy LUNGS:  Clear to auscultation bilaterally CHEST:  Unremarkable HEART:  RRR,  PMI not displaced or sustained,S1 and S2 within normal limits, no S3, no S4: no clicks, no rubs, no murmurs ABD:  Soft, nontender. BS +, no masses or bruits. No hepatomegaly, no splenomegaly EXT:  2 + pulses throughout, he is wearing leg braces bilaterally, very mild ankle edema- support hose in place, no cyanosis no clubbing SKIN:  Warm and dry.  No rashes NEURO:  Alert and oriented x 3. Cranial nerves II through XII intact. PSYCH:  Cognitively intact    LABORATORY DATA: Lab Results  Component Value Date   WBC 7.4 01/05/2018   HGB 13.0 01/05/2018   HCT 37.8 (L) 01/05/2018   PLT 176 01/05/2018   GLUCOSE 99 01/05/2018   CHOL 138 07/26/2017   TRIG 66 07/26/2017   HDL 70 07/26/2017   LDLCALC 55 07/26/2017   ALT 25 07/26/2017   AST 23 07/26/2017   NA 130 (L) 01/05/2018   K 4.4 01/05/2018   CL 97 (L) 01/05/2018   CREATININE 0.65 01/05/2018   BUN 11 01/05/2018   CO2 26 01/05/2018   TSH 2.65 10/19/2017   PSA 0.47 10/19/2017   INR 1.05 06/07/2012   HGBA1C 6.1 09/28/2012   Labs from [facility] [facility] 10/15/15: Normal CBC and CMET.  Dated 03/31/17: normal CMET and CBC.  Dated 10/12/17: sodium 130 otherwise CMET normal. CBC normal.  Assessment / Plan: 1. Coronary disease status post CABG in 2004. His last nuclear stress test in May of 2011 was normal. We will continue with his medical management and risk factor modification. He is asymptomatic.   2. Hyperlipidemia. Excellent control on statin.  3. PolyNeuropathy.  4. Leg edema. I think this is related to the fact that he wears braces and his legs are dependent much of the day.  Continue conservative therapy with support hose, elevation when possible, and sodium restriction.  I will follow  up in one year.

## 2018-01-23 DIAGNOSIS — M25561 Pain in right knee: Secondary | ICD-10-CM | POA: Diagnosis not present

## 2018-01-25 ENCOUNTER — Encounter: Payer: Self-pay | Admitting: Cardiology

## 2018-01-25 ENCOUNTER — Ambulatory Visit: Payer: Medicare HMO | Admitting: Cardiology

## 2018-01-25 VITALS — BP 108/68 | HR 99 | Ht 73.0 in | Wt 193.0 lb

## 2018-01-25 DIAGNOSIS — I25810 Atherosclerosis of coronary artery bypass graft(s) without angina pectoris: Secondary | ICD-10-CM | POA: Diagnosis not present

## 2018-01-25 DIAGNOSIS — E7800 Pure hypercholesterolemia, unspecified: Secondary | ICD-10-CM

## 2018-01-25 DIAGNOSIS — R600 Localized edema: Secondary | ICD-10-CM

## 2018-01-25 DIAGNOSIS — M25561 Pain in right knee: Secondary | ICD-10-CM | POA: Diagnosis not present

## 2018-01-25 NOTE — Patient Instructions (Signed)
Continue your current therapy  I will see you in one year   

## 2018-01-27 DIAGNOSIS — M25561 Pain in right knee: Secondary | ICD-10-CM | POA: Diagnosis not present

## 2018-01-30 DIAGNOSIS — M25561 Pain in right knee: Secondary | ICD-10-CM | POA: Diagnosis not present

## 2018-02-01 DIAGNOSIS — M25561 Pain in right knee: Secondary | ICD-10-CM | POA: Diagnosis not present

## 2018-02-03 DIAGNOSIS — M25561 Pain in right knee: Secondary | ICD-10-CM | POA: Diagnosis not present

## 2018-02-06 DIAGNOSIS — M25561 Pain in right knee: Secondary | ICD-10-CM | POA: Diagnosis not present

## 2018-02-07 ENCOUNTER — Telehealth: Payer: Self-pay | Admitting: Internal Medicine

## 2018-02-07 NOTE — Telephone Encounter (Signed)
Patient is calling in regards to his prolia shot and would like to know when he is due for this.   Thanks!

## 2018-02-08 DIAGNOSIS — M25561 Pain in right knee: Secondary | ICD-10-CM | POA: Diagnosis not present

## 2018-02-08 NOTE — Telephone Encounter (Signed)
Spoke with Joycelyn Schmid and Merry Proud today and we had to call to have this patients SOB run again

## 2018-02-15 DIAGNOSIS — Z96651 Presence of right artificial knee joint: Secondary | ICD-10-CM | POA: Diagnosis not present

## 2018-02-15 DIAGNOSIS — Z471 Aftercare following joint replacement surgery: Secondary | ICD-10-CM | POA: Diagnosis not present

## 2018-02-15 DIAGNOSIS — L89609 Pressure ulcer of unspecified heel, unspecified stage: Secondary | ICD-10-CM | POA: Diagnosis not present

## 2018-02-16 ENCOUNTER — Other Ambulatory Visit: Payer: Self-pay | Admitting: Internal Medicine

## 2018-02-16 NOTE — Telephone Encounter (Signed)
OK to refill

## 2018-02-16 NOTE — Telephone Encounter (Signed)
Please advise on below  

## 2018-02-20 ENCOUNTER — Encounter: Payer: Self-pay | Admitting: Internal Medicine

## 2018-02-20 DIAGNOSIS — L89610 Pressure ulcer of right heel, unstageable: Secondary | ICD-10-CM | POA: Diagnosis not present

## 2018-02-20 DIAGNOSIS — Z96651 Presence of right artificial knee joint: Secondary | ICD-10-CM | POA: Diagnosis not present

## 2018-02-21 NOTE — Telephone Encounter (Signed)
PA was approved for this patient will try to contact again tomorrow to schedule a nurse visit

## 2018-02-22 NOTE — Telephone Encounter (Signed)
Brandon Ray, just FYI about [name]. Let's have his Prolia ready if he can come.

## 2018-02-23 NOTE — Telephone Encounter (Signed)
Patient scheduled 03/02/18 for injection

## 2018-03-02 ENCOUNTER — Ambulatory Visit (INDEPENDENT_AMBULATORY_CARE_PROVIDER_SITE_OTHER): Payer: Medicare HMO

## 2018-03-02 DIAGNOSIS — M810 Age-related osteoporosis without current pathological fracture: Secondary | ICD-10-CM | POA: Diagnosis not present

## 2018-03-02 MED ORDER — DENOSUMAB 60 MG/ML ~~LOC~~ SOSY
60.0000 mg | PREFILLED_SYRINGE | Freq: Once | SUBCUTANEOUS | Status: AC
  Administered 2018-03-02: 60 mg via SUBCUTANEOUS

## 2018-03-02 NOTE — Progress Notes (Signed)
03/02/18   I supervised the injection.  Philemon Kingdom, MD PhD [facility] End[facility]

## 2018-03-08 NOTE — Telephone Encounter (Signed)
Great! Ty!

## 2018-03-08 NOTE — Telephone Encounter (Signed)
Patient received Prolia on 03/02/18

## 2018-03-13 ENCOUNTER — Encounter (HOSPITAL_BASED_OUTPATIENT_CLINIC_OR_DEPARTMENT_OTHER): Payer: Medicare HMO | Attending: Internal Medicine

## 2018-03-13 DIAGNOSIS — L03115 Cellulitis of right lower limb: Secondary | ICD-10-CM | POA: Diagnosis not present

## 2018-03-13 DIAGNOSIS — I2510 Atherosclerotic heart disease of native coronary artery without angina pectoris: Secondary | ICD-10-CM | POA: Insufficient documentation

## 2018-03-13 DIAGNOSIS — L89612 Pressure ulcer of right heel, stage 2: Secondary | ICD-10-CM | POA: Insufficient documentation

## 2018-03-13 DIAGNOSIS — Z87891 Personal history of nicotine dependence: Secondary | ICD-10-CM | POA: Insufficient documentation

## 2018-03-13 DIAGNOSIS — I7300 Raynaud's syndrome without gangrene: Secondary | ICD-10-CM | POA: Insufficient documentation

## 2018-03-13 DIAGNOSIS — G6181 Chronic inflammatory demyelinating polyneuritis: Secondary | ICD-10-CM | POA: Insufficient documentation

## 2018-03-20 ENCOUNTER — Encounter (HOSPITAL_BASED_OUTPATIENT_CLINIC_OR_DEPARTMENT_OTHER): Payer: Medicare HMO | Attending: Internal Medicine

## 2018-03-20 DIAGNOSIS — L89610 Pressure ulcer of right heel, unstageable: Secondary | ICD-10-CM | POA: Diagnosis not present

## 2018-03-20 DIAGNOSIS — I2510 Atherosclerotic heart disease of native coronary artery without angina pectoris: Secondary | ICD-10-CM | POA: Diagnosis not present

## 2018-03-20 DIAGNOSIS — L89612 Pressure ulcer of right heel, stage 2: Secondary | ICD-10-CM | POA: Insufficient documentation

## 2018-03-27 DIAGNOSIS — I2510 Atherosclerotic heart disease of native coronary artery without angina pectoris: Secondary | ICD-10-CM | POA: Diagnosis not present

## 2018-03-27 DIAGNOSIS — L89612 Pressure ulcer of right heel, stage 2: Secondary | ICD-10-CM | POA: Diagnosis not present

## 2018-03-28 DIAGNOSIS — L89610 Pressure ulcer of right heel, unstageable: Secondary | ICD-10-CM | POA: Diagnosis not present

## 2018-03-29 ENCOUNTER — Other Ambulatory Visit: Payer: Self-pay | Admitting: Internal Medicine

## 2018-04-03 DIAGNOSIS — I2510 Atherosclerotic heart disease of native coronary artery without angina pectoris: Secondary | ICD-10-CM | POA: Diagnosis not present

## 2018-04-03 DIAGNOSIS — L89612 Pressure ulcer of right heel, stage 2: Secondary | ICD-10-CM | POA: Diagnosis not present

## 2018-04-10 DIAGNOSIS — L03115 Cellulitis of right lower limb: Secondary | ICD-10-CM | POA: Diagnosis not present

## 2018-04-10 DIAGNOSIS — L89613 Pressure ulcer of right heel, stage 3: Secondary | ICD-10-CM | POA: Diagnosis not present

## 2018-04-10 DIAGNOSIS — L89610 Pressure ulcer of right heel, unstageable: Secondary | ICD-10-CM | POA: Diagnosis not present

## 2018-04-10 DIAGNOSIS — I2510 Atherosclerotic heart disease of native coronary artery without angina pectoris: Secondary | ICD-10-CM | POA: Diagnosis not present

## 2018-04-10 DIAGNOSIS — L89612 Pressure ulcer of right heel, stage 2: Secondary | ICD-10-CM | POA: Diagnosis not present

## 2018-04-17 DIAGNOSIS — L89610 Pressure ulcer of right heel, unstageable: Secondary | ICD-10-CM | POA: Diagnosis not present

## 2018-04-17 DIAGNOSIS — L03115 Cellulitis of right lower limb: Secondary | ICD-10-CM | POA: Diagnosis not present

## 2018-04-17 DIAGNOSIS — L89612 Pressure ulcer of right heel, stage 2: Secondary | ICD-10-CM | POA: Diagnosis not present

## 2018-04-17 DIAGNOSIS — I2510 Atherosclerotic heart disease of native coronary artery without angina pectoris: Secondary | ICD-10-CM | POA: Diagnosis not present

## 2018-04-18 DIAGNOSIS — L89610 Pressure ulcer of right heel, unstageable: Secondary | ICD-10-CM | POA: Diagnosis not present

## 2018-05-01 ENCOUNTER — Encounter (HOSPITAL_BASED_OUTPATIENT_CLINIC_OR_DEPARTMENT_OTHER): Payer: Medicare HMO | Attending: Internal Medicine

## 2018-05-01 DIAGNOSIS — L89612 Pressure ulcer of right heel, stage 2: Secondary | ICD-10-CM | POA: Insufficient documentation

## 2018-05-01 DIAGNOSIS — Z96651 Presence of right artificial knee joint: Secondary | ICD-10-CM | POA: Insufficient documentation

## 2018-05-01 DIAGNOSIS — Z951 Presence of aortocoronary bypass graft: Secondary | ICD-10-CM | POA: Diagnosis not present

## 2018-05-01 DIAGNOSIS — I2510 Atherosclerotic heart disease of native coronary artery without angina pectoris: Secondary | ICD-10-CM | POA: Diagnosis not present

## 2018-05-01 DIAGNOSIS — Z87891 Personal history of nicotine dependence: Secondary | ICD-10-CM | POA: Diagnosis not present

## 2018-05-04 DIAGNOSIS — Z96651 Presence of right artificial knee joint: Secondary | ICD-10-CM | POA: Diagnosis not present

## 2018-05-08 DIAGNOSIS — Z96651 Presence of right artificial knee joint: Secondary | ICD-10-CM | POA: Diagnosis not present

## 2018-05-08 DIAGNOSIS — Z87891 Personal history of nicotine dependence: Secondary | ICD-10-CM | POA: Diagnosis not present

## 2018-05-08 DIAGNOSIS — I2510 Atherosclerotic heart disease of native coronary artery without angina pectoris: Secondary | ICD-10-CM | POA: Diagnosis not present

## 2018-05-08 DIAGNOSIS — L89619 Pressure ulcer of right heel, unspecified stage: Secondary | ICD-10-CM | POA: Diagnosis not present

## 2018-05-08 DIAGNOSIS — L89612 Pressure ulcer of right heel, stage 2: Secondary | ICD-10-CM | POA: Diagnosis not present

## 2018-05-08 DIAGNOSIS — L89610 Pressure ulcer of right heel, unstageable: Secondary | ICD-10-CM | POA: Diagnosis not present

## 2018-05-08 DIAGNOSIS — Z951 Presence of aortocoronary bypass graft: Secondary | ICD-10-CM | POA: Diagnosis not present

## 2018-05-23 DIAGNOSIS — H43813 Vitreous degeneration, bilateral: Secondary | ICD-10-CM | POA: Diagnosis not present

## 2018-05-23 DIAGNOSIS — H35371 Puckering of macula, right eye: Secondary | ICD-10-CM | POA: Diagnosis not present

## 2018-05-23 DIAGNOSIS — H353133 Nonexudative age-related macular degeneration, bilateral, advanced atrophic without subfoveal involvement: Secondary | ICD-10-CM | POA: Diagnosis not present

## 2018-05-23 DIAGNOSIS — H353212 Exudative age-related macular degeneration, right eye, with inactive choroidal neovascularization: Secondary | ICD-10-CM | POA: Diagnosis not present

## 2018-05-26 DIAGNOSIS — H43813 Vitreous degeneration, bilateral: Secondary | ICD-10-CM | POA: Diagnosis not present

## 2018-05-26 DIAGNOSIS — H524 Presbyopia: Secondary | ICD-10-CM | POA: Diagnosis not present

## 2018-05-26 DIAGNOSIS — H353132 Nonexudative age-related macular degeneration, bilateral, intermediate dry stage: Secondary | ICD-10-CM | POA: Diagnosis not present

## 2018-05-26 DIAGNOSIS — H26492 Other secondary cataract, left eye: Secondary | ICD-10-CM | POA: Diagnosis not present

## 2018-05-30 DIAGNOSIS — R69 Illness, unspecified: Secondary | ICD-10-CM | POA: Diagnosis not present

## 2018-06-07 DIAGNOSIS — G629 Polyneuropathy, unspecified: Secondary | ICD-10-CM | POA: Diagnosis not present

## 2018-06-07 DIAGNOSIS — R262 Difficulty in walking, not elsewhere classified: Secondary | ICD-10-CM | POA: Diagnosis not present

## 2018-06-08 ENCOUNTER — Other Ambulatory Visit: Payer: Self-pay | Admitting: Internal Medicine

## 2018-06-08 NOTE — Telephone Encounter (Signed)
Ok to fill 

## 2018-06-08 NOTE — Telephone Encounter (Signed)
RX has been faxed to pharmacy.

## 2018-06-08 NOTE — Telephone Encounter (Signed)
Routed to me by mistake

## 2018-06-08 NOTE — Telephone Encounter (Signed)
Yes

## 2018-06-14 DIAGNOSIS — M25661 Stiffness of right knee, not elsewhere classified: Secondary | ICD-10-CM | POA: Diagnosis not present

## 2018-06-19 DIAGNOSIS — M25661 Stiffness of right knee, not elsewhere classified: Secondary | ICD-10-CM | POA: Diagnosis not present

## 2018-06-21 DIAGNOSIS — M25661 Stiffness of right knee, not elsewhere classified: Secondary | ICD-10-CM | POA: Diagnosis not present

## 2018-06-26 DIAGNOSIS — M25661 Stiffness of right knee, not elsewhere classified: Secondary | ICD-10-CM | POA: Diagnosis not present

## 2018-06-28 DIAGNOSIS — R69 Illness, unspecified: Secondary | ICD-10-CM | POA: Diagnosis not present

## 2018-06-28 DIAGNOSIS — M25661 Stiffness of right knee, not elsewhere classified: Secondary | ICD-10-CM | POA: Diagnosis not present

## 2018-07-03 DIAGNOSIS — M25661 Stiffness of right knee, not elsewhere classified: Secondary | ICD-10-CM | POA: Diagnosis not present

## 2018-07-05 DIAGNOSIS — D485 Neoplasm of uncertain behavior of skin: Secondary | ICD-10-CM | POA: Diagnosis not present

## 2018-07-05 DIAGNOSIS — L821 Other seborrheic keratosis: Secondary | ICD-10-CM | POA: Diagnosis not present

## 2018-07-05 DIAGNOSIS — M25661 Stiffness of right knee, not elsewhere classified: Secondary | ICD-10-CM | POA: Diagnosis not present

## 2018-07-05 DIAGNOSIS — B353 Tinea pedis: Secondary | ICD-10-CM | POA: Diagnosis not present

## 2018-07-05 DIAGNOSIS — Z23 Encounter for immunization: Secondary | ICD-10-CM | POA: Diagnosis not present

## 2018-07-05 DIAGNOSIS — L609 Nail disorder, unspecified: Secondary | ICD-10-CM | POA: Diagnosis not present

## 2018-07-05 DIAGNOSIS — L603 Nail dystrophy: Secondary | ICD-10-CM | POA: Diagnosis not present

## 2018-08-02 DIAGNOSIS — G6289 Other specified polyneuropathies: Secondary | ICD-10-CM | POA: Diagnosis not present

## 2018-08-02 DIAGNOSIS — M79672 Pain in left foot: Secondary | ICD-10-CM | POA: Diagnosis not present

## 2018-08-02 DIAGNOSIS — M79671 Pain in right foot: Secondary | ICD-10-CM | POA: Diagnosis not present

## 2018-08-02 DIAGNOSIS — M79642 Pain in left hand: Secondary | ICD-10-CM | POA: Diagnosis not present

## 2018-08-02 DIAGNOSIS — M79641 Pain in right hand: Secondary | ICD-10-CM | POA: Diagnosis not present

## 2018-08-15 ENCOUNTER — Other Ambulatory Visit: Payer: Self-pay | Admitting: Cardiology

## 2018-08-15 ENCOUNTER — Other Ambulatory Visit: Payer: Self-pay | Admitting: Internal Medicine

## 2018-08-15 NOTE — Telephone Encounter (Signed)
Please refill if appropriate

## 2018-08-15 NOTE — Telephone Encounter (Signed)
Yes, OK 

## 2018-08-25 ENCOUNTER — Other Ambulatory Visit: Payer: Self-pay | Admitting: Internal Medicine

## 2018-08-28 ENCOUNTER — Telehealth: Payer: Self-pay | Admitting: Internal Medicine

## 2018-08-28 NOTE — Telephone Encounter (Signed)
[facility] called for the testosterone rx to be resent they did not get it on 08/15/18. I asked if I could do a verbal they did allow so the patients androgel has been received by [facility] per the med list

## 2018-08-28 NOTE — Telephone Encounter (Signed)
Pt is calling to let us know he is due to his prolia shot, I did not want to schedule as there is nothing in the system yet. He does state he is due this week.

## 2018-08-28 NOTE — Telephone Encounter (Signed)
LMTCB to schedule nurse visit for Prolia-he owes $255 at check in and can be scheduled anytime after 09/01/18

## 2018-08-30 NOTE — Telephone Encounter (Signed)
LVM requesting a call back to get Prolia

## 2018-09-01 NOTE — Telephone Encounter (Signed)
Scheduled patient for Proia on 09/05/18

## 2018-09-05 ENCOUNTER — Ambulatory Visit (INDEPENDENT_AMBULATORY_CARE_PROVIDER_SITE_OTHER): Payer: Medicare HMO

## 2018-09-05 DIAGNOSIS — M810 Age-related osteoporosis without current pathological fracture: Secondary | ICD-10-CM | POA: Diagnosis not present

## 2018-09-05 MED ORDER — DENOSUMAB 60 MG/ML ~~LOC~~ SOSY
60.0000 mg | PREFILLED_SYRINGE | Freq: Once | SUBCUTANEOUS | Status: AC
  Administered 2018-09-05: 60 mg via SUBCUTANEOUS

## 2018-09-05 NOTE — Progress Notes (Signed)
Per orders of Dr. Cruzita Lederer injection of Prolia $RemoveB'60mg'DzpYXejb$ /mL given R upper outer aspect of arm today by L. Henrene Pastor, LPN . Patient tolerated injection well.

## 2018-09-21 DIAGNOSIS — M25511 Pain in right shoulder: Secondary | ICD-10-CM | POA: Diagnosis not present

## 2018-09-21 DIAGNOSIS — I2510 Atherosclerotic heart disease of native coronary artery without angina pectoris: Secondary | ICD-10-CM | POA: Diagnosis not present

## 2018-09-21 DIAGNOSIS — G40909 Epilepsy, unspecified, not intractable, without status epilepticus: Secondary | ICD-10-CM | POA: Diagnosis not present

## 2018-09-21 DIAGNOSIS — G6181 Chronic inflammatory demyelinating polyneuritis: Secondary | ICD-10-CM | POA: Diagnosis not present

## 2018-10-02 DIAGNOSIS — Z23 Encounter for immunization: Secondary | ICD-10-CM | POA: Diagnosis not present

## 2018-10-02 DIAGNOSIS — L814 Other melanin hyperpigmentation: Secondary | ICD-10-CM | POA: Diagnosis not present

## 2018-10-02 DIAGNOSIS — L821 Other seborrheic keratosis: Secondary | ICD-10-CM | POA: Diagnosis not present

## 2018-10-02 DIAGNOSIS — D1801 Hemangioma of skin and subcutaneous tissue: Secondary | ICD-10-CM | POA: Diagnosis not present

## 2018-10-02 DIAGNOSIS — L570 Actinic keratosis: Secondary | ICD-10-CM | POA: Diagnosis not present

## 2018-10-02 DIAGNOSIS — L820 Inflamed seborrheic keratosis: Secondary | ICD-10-CM | POA: Diagnosis not present

## 2018-10-11 DIAGNOSIS — D472 Monoclonal gammopathy: Secondary | ICD-10-CM | POA: Diagnosis not present

## 2018-10-17 ENCOUNTER — Encounter: Payer: Self-pay | Admitting: Internal Medicine

## 2018-10-17 ENCOUNTER — Ambulatory Visit: Payer: Medicare HMO | Admitting: Internal Medicine

## 2018-10-17 VITALS — BP 122/60 | HR 90 | Ht 73.0 in | Wt 189.0 lb

## 2018-10-17 DIAGNOSIS — M810 Age-related osteoporosis without current pathological fracture: Secondary | ICD-10-CM

## 2018-10-17 DIAGNOSIS — E291 Testicular hypofunction: Secondary | ICD-10-CM

## 2018-10-17 DIAGNOSIS — E559 Vitamin D deficiency, unspecified: Secondary | ICD-10-CM | POA: Diagnosis not present

## 2018-10-17 DIAGNOSIS — E538 Deficiency of other specified B group vitamins: Secondary | ICD-10-CM

## 2018-10-17 LAB — CBC
HCT: 48.9 % (ref 39.0–52.0)
Hemoglobin: 16.6 g/dL (ref 13.0–17.0)
MCHC: 34 g/dL (ref 30.0–36.0)
MCV: 96.1 fl (ref 78.0–100.0)
Platelets: 157 10*3/uL (ref 150.0–400.0)
RBC: 5.08 Mil/uL (ref 4.22–5.81)
RDW: 13.5 % (ref 11.5–15.5)
WBC: 3.7 10*3/uL — ABNORMAL LOW (ref 4.0–10.5)

## 2018-10-17 LAB — VITAMIN B12: Vitamin B-12: 1251 pg/mL — ABNORMAL HIGH (ref 211–911)

## 2018-10-17 LAB — VITAMIN D 25 HYDROXY (VIT D DEFICIENCY, FRACTURES): VITD: 91.84 ng/mL (ref 30.00–100.00)

## 2018-10-17 NOTE — Progress Notes (Signed)
Subjective:     Patient ID: Brandon Ray, male   DOB: 26-Dec-1934, 84 y.o.   MRN: [id number]  HPI Brandon Ray is a very pleasant 84 y.o. man with h/o severe polyneuropathy and monoclonal gammopathy, returning for f/u for primary hypogonadism, osteoporosis + h/o sacral fracture, low B12. Last visit 1 year ago.  He had TKR 12/2017 >> wet well but developed a heel ulcer >> he could not bear weight >> loss muscle >> now feeling weak.  He fell few times since last visit, on carpet, no fractures.  He has increased sweating since last visit.  This is very bothersome for him.  Reviewed and addended history: He continues to have severe neuropathy. He also still has discomfort in the mid thoracic region, pelvic region and his hands.  He was previously investigated for POEMS sd. - prev. Seen by Dr Lamonte Sakai, then had an evaluation by Dr. Karie Kirks. Tuchman (Heme/Onc at [facility]) for this, but it was concluded that he actually has MGUS and will be just followed for it. He also had a fat pad biopsy to rule out amyloidosis and this was negative.   Please see my previous notes regarding patient's past medical history. He does have polyneuropathy, monoclonal gammopathy, gynecomastia and primary hypogonadism, but no organomegaly or significant skin changes. He also sees Dr. Tessa Lerner in pain clinic,  Dr Trinna Post (neurology) at the [facility]. Dr. Rushie Goltz is his local neurologist ([facility]). 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   He Was on Neurontin high doses >> not helping >> sees Pain Clinic at [facility] >> tried Cymbalta + Neurontin >> did not help >> now on Lyrica, which finally gives him some relief  Primary hypogonadism: - We initially started him on testosterone gel 5 g daily, then we increased to 7.5 g daily.  He could not tolerate this dose well: Increase libido, pelvic tension, breast tension.  He is now on 5 g 5 out of 7 days.  Reviewed pertinent labs: Testosterone was normal at last  visit: Component     Latest Ref Rng & Units 10/19/2017  Testosterone, Serum (Total)     ng/dL 674  % Free Testosterone     % 1.1  Free Testosterone, S     pg/mL 74  Sex Hormone Binding Globulin     nmol/L 80.9 (H)   Component     Latest Ref Rng & Units 10/20/2015 10/19/2016  Testosterone     264 - 916 ng/dL 251 862  Sex Horm Binding Glob, Serum     19.3 - 76.4 nmol/L 55 71.5  Testosterone Free     6.6 - 18.1 pg/mL 34.6 (L) 10.3  Testosterone-% Free     1.6 - 2.9 % 1.4 (L)    At last visit, PSA was higher, but still normal. Lab Results  Component Value Date   PSA 0.47 10/19/2017   PSA 0.20 01/31/2017   PSA 5.89 (H) 10/19/2016   PSA 0.30 10/20/2015   PSA 0.35 04/16/2015   PSA 0.48 03/13/2014   PSA 0.15 02/09/2013   Last Hb/HT reviewed: Not elevated: Lab Results  Component Value Date   WBC 7.4 01/05/2018   HGB 13.0 01/05/2018   HCT 37.8 (L) 01/05/2018   MCV 92.2 01/05/2018   PLT 176 01/05/2018  10/12/2017: 16/47.80 10/15/2015: Hb 15.8/HT 47.8 04/15/2015: Hb 16.4 (13.7-17.3)/HT 50.2 (39-49)  He sees his urologist on a regular basis. Last DRE 11/2017. He has this every year.  He is on  Lyrica ER 330 mg once daily >> not working well, not covered. He will go back to the instant release.  Patient also has gynecomastia, with normal mammograms.  This is likely secondary to his primary hypogonadism.  It has not improved after starting testosterone.  Prolactin level was normal.He also has dysesthesia at the level of his nipples, probably related to his neuropathy.  This was slightly improved on Lyrica  Patient has been on high-dose steroids for his neuropathy hy (started at 60 mg in 07/2012 >> stopped prednisone 09/2013  Patient with osteopenic BMD, however with a sacral fracture and also a right big toe fracture (04/2013), likely secondary to steroid treatment +/- hypogonadism.  No fractures since last visit.  Reviewed latest bone density ([place]) reports-latest T-scores  improved:  DXA 11/28/2016: L1-L4 T score of +0.4 (+3.3%*) and right femoral neck -1.3, left femoral neck -0.8 (mean femoral neck: +3.0%)  DXA 11/10/2014: L1-L4 T score of +0.1 and right femoral neck -1.4, left femoral neck -1.2 DXA 11/06/2012: L1-L4 T score of -0.9 and dual femur T score of -1.1  We started Prolia and he had a total of 8 injections, last 09/05/2018.  He denies any side effects from Prolia.  No dental work in progress.  No jaw or thigh/hip pain.  Vitamin D levels were normal Lab Results  Component Value Date   VD25OH 62.54 10/19/2017   VD25OH 58.28 10/19/2016   VD25OH 54.39 10/20/2015   VD25OH 64.27 04/16/2015   VD25OH 56.34 10/04/2014   VD25OH 67 09/14/2013   VD25OH 55 09/28/2012  He continues on 5000 units vitamin D and 400 units vit D - 600 mg calcium daily.  Has a history of low B12: Lab Results  Component Value Date   VITAMINB12 1,309 (H) 10/19/2017   VITAMINB12 >1500 (H) 01/31/2017   VITAMINB12 225 10/19/2016  He continues on B12 p.o. 5000 mcg every other day.  He continues on phenobarbital since 1961.   Review of Systems + see HPI + Constitutional: no weight gain/no weight loss, no fatigue, + increased sweating, no subjective hypothermia Eyes: no blurry vision, no xerophthalmia ENT: no sore throat, no nodules palpated in neck, no dysphagia, no odynophagia, no hoarseness Cardiovascular: no CP/no SOB/no palpitations/no leg swelling Respiratory: no cough/no SOB/no wheezing Gastrointestinal: no N/no V/no D/no C/no acid reflux Musculoskeletal: no muscle aches/no joint aches Skin: no rashes, no hair loss Neurological: no tremors/+ numbness/+ tingling/no dizziness  I reviewed pt's medications, allergies, PMH, social hx, family hx, and changes were documented in the history of present illness.   Past Medical History:  Diagnosis Date  . Arthritis   . BPH (benign prostatic hypertrophy) 2014  . Coronary artery disease 01/2003   CABG  . Free monoclonal light  chain    urine  . History of blood transfusion 2004  . Hyperlipidemia    on medication  . Macular degeneration    both eyes  . Peripheral neuropathy 05/2011    follow up with Dr. Tish Frederickson multiple issues  . Pleural effusion 05/2011   from abdominal surgery  . Seizures ([place]) 1950's   last one 1961  . Small bowel obstruction ([place]) 05/2011   due to adhesion  . Weakness 2013-May began   lower  from neuropathy from knees down   Past Surgical History:  Procedure Laterality Date  . ABDOMINAL SURGERY  05/2011   bowel obstruction  . [place]   left  . CARDIAC CATHETERIZATION  01/27/2003   NORMAL LEFT VENTRICULAR  SIZE WITH MILD FOCAL LATERAL WALL HYPOKINESIA. EF 60%  . CORONARY ARTERY BYPASS GRAFT  2004   LIMA GRAFT TO THE LAD, SAPHENOUS VEIN GRAFT TO THE DIAGONAL, SAPHENOUS VEIN GRAFT TO THE FIRST OM, SAPHENOUS VEIN GRAFT TO THE PDA  . HAND SURGERY  2000   left  . TOTAL KNEE ARTHROPLASTY Right 01/03/2018   Procedure: RIGHT TOTAL KNEE ARTHROPLASTY;  Surgeon: Paralee Cancel, MD;  Location: [facility];  Service: Orthopedics;  Laterality: Right;  . TRANSURETHRAL RESECTION OF PROSTATE  1992  . TRANSURETHRAL RESECTION OF PROSTATE N/A 12/22/2012   Procedure: TRANSURETHRAL RESECTION OF THE PROSTATE WITH GYRUS (TURP);  Surgeon: Fredricka Bonine, MD;  Location: [facility];  Service: Urology;  Laterality: N/A;   Social History   Socioeconomic History  . Marital status: Married    Spouse name: Not on file  . Number of children: 4  . Years of education: Not on file  . Highest education level: Not on file  Occupational History  . Occupation: Freight forwarder    Comment: retired, Personnel officer.   Social Needs  . Financial resource strain: Not on file  . Food insecurity:    Worry: Not on file    Inability: Not on file  . Transportation needs:    Medical: Not on file    Non-medical: Not on file  Tobacco Use  . Smoking status: Former Smoker    Packs/day:  1.00    Years: 5.00    Pack years: 5.00    Types: Cigarettes    Last attempt to quit: 07/01/1965    Years since quitting: 53.3  . Smokeless tobacco: Never Used  Substance and Sexual Activity  . Alcohol use: No  . Drug use: No  . Sexual activity: Not on file  Lifestyle  . Physical activity:    Days per week: Not on file    Minutes per session: Not on file  . Stress: Not on file  Relationships  . Social connections:    Talks on phone: Not on file    Gets together: Not on file    Attends religious service: Not on file    Active member of club or organization: Not on file    Attends meetings of clubs or organizations: Not on file    Relationship status: Not on file  . Intimate partner violence:    Fear of current or ex partner: Not on file    Emotionally abused: Not on file    Physically abused: Not on file    Forced sexual activity: Not on file  Other Topics Concern  . Not on file  Social History Narrative  . Not on file   Current Outpatient Medications on File Prior to Visit  Medication Sig Dispense Refill  . atorvastatin (LIPITOR) 20 MG tablet TAKE 1 TABLET (20 MG TOTAL) BY MOUTH DAILY AT 6 PM 90 tablet 3  . calcium-vitamin D (OSCAL WITH D) 500-200 MG-UNIT per tablet Take 1 tablet by mouth daily at 2 PM.     . Cholecalciferol (VITAMIN D-3) 5000 units TABS Take 5,000 Units by mouth every evening.    . docusate sodium (COLACE) 100 MG capsule Take 1 capsule (100 mg total) by mouth 2 (two) times daily. 10 capsule 0  . HYDROcodone-acetaminophen (NORCO) 7.5-325 MG tablet Take 1-2 tablets by mouth every 4 (four) hours as needed for moderate pain. 60 tablet 0  . methocarbamol (ROBAXIN) 500 MG tablet Take 1 tablet (500 mg total) by mouth every  6 (six) hours as needed for muscle spasms. 40 tablet 0  . Multiple Vitamins-Minerals (PRESERVISION AREDS 2 PO) Take 2 capsules by mouth at bedtime.     . Omega-3 Fatty Acids (FISH OIL) 1200 MG CAPS Take 1,200 mg by mouth at bedtime.     Marland Kitchen  PHENobarbital (LUMINAL) 97.2 MG tablet Take 97.2 mg by mouth at bedtime.     . polyethylene glycol (MIRALAX / GLYCOLAX) packet Take 17 g by mouth 2 (two) times daily. 14 each 0  . pregabalin (LYRICA) 75 MG capsule Take 75 mg by mouth 4 (four) times daily.    . tamsulosin (FLOMAX) 0.4 MG CAPS capsule Take 0.4 mg by mouth at bedtime.     Marland Kitchen testosterone (ANDROGEL) 50 MG/5GM (1%) GEL APPLY EXTERNALLY TO SPECIFIC AREA OF SKIN 450 g 0   No current facility-administered medications on file prior to visit.    Allergies  Allergen Reactions  . Amoxicillin Anaphylaxis and Rash    Upper torso only. Has patient had a PCN reaction causing immediate rash, facial/tongue/throat swelling, SOB or lightheadedness with hypotension: No Has patient had a PCN reaction causing severe rash involving mucus membranes or skin necrosis: No Has patient had a PCN reaction that required hospitalization: No Has patient had a PCN reaction occurring within the last 10 years: Yes If all of the above answers are "NO", then may proceed with Cephalosporin use.   . Boostrix [Tetanus-Diphth-Acell Pertussis] Other (See Comments)    Neuropathy symptoms  . Other Other (See Comments)    Flu shot-neuropathy symptoms   Family History  Problem Relation Age of Onset  . Asthma Mother   . Heart disease Mother 50  . Coronary artery disease Father   . Heart disease Father   . Cancer Sister 51       breast    Objective:   Physical Exam There were no vitals taken for this visit. There is no height or weight on file to calculate BMI. Wt Readings from Last 3 Encounters:  01/25/18 193 lb (87.5 kg)  01/03/18 200 lb (90.7 kg)  12/29/17 200 lb (90.7 kg)   Constitutional: overweight, in NAD, walks with a cane Eyes: PERRLA, EOMI, no exophthalmos ENT: moist mucous membranes, no thyromegaly, no cervical lymphadenopathy Cardiovascular: RRR, No MRG, + B pitting edema Respiratory: CTA B Gastrointestinal: abdomen soft, NT, ND,  BS+ Musculoskeletal: no deformities, strength intact in all 4 Skin: moist, warm, no rashes Neurological: no tremor with outstretched hands, DTR normal in all 4  Assessment:     1. Primary hypogonadism - + gynecomastia bilaterally - per mammography + testicular atrophy - He is on testosterone gel >> a sensation of pressure in abdomen, but this did not subside after stopping testosterone gel for 1 mo after his surgery for Transurethral resection of prostate residual growth in 12/22/2012.   2. Osteoporosis - h/o sacral fracture, right big toe fracture (04/2013), spine X-ray: no vb fx - Off steroids now - on adequate Ca+vit D - On Prolia, no side effects  3. Polyneuropathy - unlikely POEMS syndrome per evaluation by Hem/Onc at [facility] - amyloidosis ruled out by negative fat pad biopsy - managed by Dr. Trinna Post at [facility] and Dr. Doree Albee at [facility]   4. Long-term use of steroids - previously on prednisone 60 mg starting at the end of 2013 - Was able to taper >> now off since 09/2013  5. MGUS - monoclonal gammopathy - now managed by Dr.Tuchman at [facility], previously Dr. Lamonte Sakai  Plan:     1. Primary hypogonadism -Continues AndroGel 1% 5 g 5 out of 7 days.  He sometimes misses doses on purpose when his grandchildren are visiting, to avoid transfer. -He continues to have pelvic pressure (unclear if this is related to testosterone treatment, since he held testosterone in the past and still experienced the symptoms) -He sees Dr. Junious Silk with urology and he is up-to-date with DRE's -Reviewed previous testosterone level, PSA, and CBC.  He had a high PSA before but repeat was much better. Free Testosterone was normal at last visit so we continued the same dose of AndroGel. -At today's visit, we will recheck his testosterone, PSA, CBC -At this visit we discussed about why he needs to continue on testosterone and the only clear indication is improved bone density and possibly preventing  loss of muscle mass.  However, since he is now on Prolia, we discussed about possibly stopping testosterone to see how he feels.  This is mostly a decision based on the fact that he is sweating more and this is very uncomfortable.  2. Osteoporosis  -He has a history of osteopenia on DEXA scan but has a history of sacral fracture giving him a diagnosis of osteoporosis -He has been off prednisone and Dilantin and currently on testosterone replacement and also on Prolia.  No recent fractures, no side effects from Prolia.  He had a total of 8 injections so far, last 09/05/2018. -His latest bone mineral density was from 11/28/2016, and scores were slightly better.  He is due for repeat this March. -Plan to continue at least 6 years of Prolia, then switch to either p.o. or IV bisphosphonate for at least a year -He continues on 5000 units vitamin D daily.  We will check a level today -Continues on 600 mg daily chewable calcium -I will see him back in a year but we will send him the results of the bone density scan  3.  Low vitamin B12 -We checked his B12 in the past during investigation for possible causes for neuropathy.  This returned low so we started 5000 mcg B12 daily.  We changed this to every other day in 01/2017. -At last visit, level was slightly high, but we continue the same dose. -We will check his B12 today  Orders Placed This Encounter  Procedures  . VITAMIN D 25 Hydroxy (Vit-D Deficiency, Fractures)  . Testosterone Free with SHBG  . CBC  . Vitamin B12  . PSA, total and free   Office Visit on 10/17/2018  Component Date Value Ref Range Status  . VITD 10/17/2018 91.84  30.00 - 100.00 ng/mL Final  . Testosterone, Serum (Total) 10/17/2018 477  ng/dL Final   Comment: Reference Range: Adult Males >18 years    264 - 916 This [facility] LC/MS-MS method is currently certified by the [facility] Hormone Standardization Program (HoST).  Adult male reference interval is based on a population of  healthy nonobese males (BMI <30) between 24 and 82 years old. Maximiano Coss 0867,619;5093-2671 PMID: [id number].   . % Free Testosterone 10/17/2018 0.8  % Final   Comment: Reference Range: Adult Males: 1.5 - 3.2   . Free Testosterone, S 10/17/2018 38* pg/mL Final   Comment: Reference Range: Adult Males: 52 - 280   . Sex Hormone Binding Globulin 10/17/2018 79.6* nmol/L Final   Comment: Reference Range: >49y: 19.3 - 76.4   . WBC 10/17/2018 3.7* 4.0 - 10.5 K/uL Final  . RBC 10/17/2018 5.08  4.22 - 5.81  Mil/uL Final  . Platelets 10/17/2018 157.0  150.0 - 400.0 K/uL Final  . Hemoglobin 10/17/2018 16.6  13.0 - 17.0 g/dL Final  . HCT 10/17/2018 48.9  39.0 - 52.0 % Final  . MCV 10/17/2018 96.1  78.0 - 100.0 fl Final  . MCHC 10/17/2018 34.0  30.0 - 36.0 g/dL Final  . RDW 10/17/2018 13.5  11.5 - 15.5 % Final  . Vitamin B-12 10/17/2018 1,251* 211 - 911 pg/mL Final  . PSA, Total 10/17/2018 0.5  < OR = 4.0 ng/mL Final  . PSA, Free 10/17/2018 0.2  ng/mL Final  . PSA, % Free 10/17/2018 40  >25 % (calc) Final   Comment: . PSA(ng/mL)      Free PSA(%)     Estimated(x) Probability                                      of Cancer(as%) 0-2.5              (*)               Approx. 1 2.6-4.0(1)         0-27(2)                   24(3) 4.1-10(4)          0-10                      56                    11-15                     28                    16-20                     20                    21-25                     16                    >or =26                   8 >10(+)             N/A                      >50 . References:(1)Catalona et al.:Urology 60: [id number] (2002)            (2)Catalona et al.:J.Urol 168: 922-925 (2002)               Free PSA(%)   Sensitivity(%)  Specificity(%)               < or = 25          85              19               < or = 30          93               9            (  3)Catalona et al.:JAMA 277: [id number] (1997)            (4)Catalona et al.:JAMA 279:  [id number] (1998) . (x)These estimates vary with age, ethnicity, family     history and DRE results. (*)The                           diagnostic usefulness of % Free PSA has not been    established in patients with total PSA below 2.6 ng/mL (+)In men with PSA above 10 ng/mL, prostate cancer risk is    determined by total PSA alone. . The Total PSA value from this assay system is  standardized against the equimolar PSA standard.  The test result will be approximately 20% higher  when compared to the [facility] Total PSA  (Siemens assay). Comparison of serial PSA results  should be interpreted with this fact in mind. Marland Kitchen PSA was performed using the Beckman Coulter Immunoassay method. Values obtained from different assay methods cannot be used interchangeably. PSA levels, regardless of value, should not be interpreted as absolute evidence of the presence or absence of disease. .    Labs are OK except slightly low free T >> will await his decision about testosterone supplementation. Vit D close to the ULN >> discussed to take the supplement 5/7 days.  CC: Drs. Gable, [place]  Philemon Kingdom, MD PhD [facility]

## 2018-10-17 NOTE — Patient Instructions (Addendum)
Please stop at the lab.  Send me a message at the end of March to order the bone density scan.  Please come back for a follow-up appointment in 1 year.

## 2018-10-18 LAB — PSA, TOTAL AND FREE
PSA, % Free: 40 % (calc) (ref >25)
PSA, Free: 0.2 ng/mL
PSA, Total: 0.5 ng/mL (ref <=4.0)

## 2018-10-22 ENCOUNTER — Encounter: Payer: Self-pay | Admitting: Internal Medicine

## 2018-10-22 LAB — TESTOSTERONE, FREE AND TOTAL (INCLUDES SHBG)-(MALES)
% Free Testosterone: 0.8 %
Free Testosterone, S: 38 pg/mL — ABNORMAL LOW
Sex Hormone Binding Globulin: 79.6 nmol/L — ABNORMAL HIGH
Testosterone, Serum (Total): 477 ng/dL

## 2018-11-20 DIAGNOSIS — H353212 Exudative age-related macular degeneration, right eye, with inactive choroidal neovascularization: Secondary | ICD-10-CM | POA: Diagnosis not present

## 2018-11-20 DIAGNOSIS — H35371 Puckering of macula, right eye: Secondary | ICD-10-CM | POA: Diagnosis not present

## 2018-11-20 DIAGNOSIS — H43813 Vitreous degeneration, bilateral: Secondary | ICD-10-CM | POA: Diagnosis not present

## 2018-11-20 DIAGNOSIS — H353133 Nonexudative age-related macular degeneration, bilateral, advanced atrophic without subfoveal involvement: Secondary | ICD-10-CM | POA: Diagnosis not present

## 2019-01-23 DIAGNOSIS — M21372 Foot drop, left foot: Secondary | ICD-10-CM | POA: Diagnosis not present

## 2019-01-23 DIAGNOSIS — M21371 Foot drop, right foot: Secondary | ICD-10-CM | POA: Diagnosis not present

## 2019-01-23 DIAGNOSIS — G610 Guillain-Barre syndrome: Secondary | ICD-10-CM | POA: Diagnosis not present

## 2019-01-31 DIAGNOSIS — M79671 Pain in right foot: Secondary | ICD-10-CM | POA: Diagnosis not present

## 2019-01-31 DIAGNOSIS — M79641 Pain in right hand: Secondary | ICD-10-CM | POA: Diagnosis not present

## 2019-01-31 DIAGNOSIS — M79672 Pain in left foot: Secondary | ICD-10-CM | POA: Diagnosis not present

## 2019-01-31 DIAGNOSIS — R262 Difficulty in walking, not elsewhere classified: Secondary | ICD-10-CM | POA: Diagnosis not present

## 2019-01-31 DIAGNOSIS — M79642 Pain in left hand: Secondary | ICD-10-CM | POA: Diagnosis not present

## 2019-01-31 DIAGNOSIS — G629 Polyneuropathy, unspecified: Secondary | ICD-10-CM | POA: Diagnosis not present

## 2019-02-01 ENCOUNTER — Telehealth: Payer: Self-pay | Admitting: Cardiology

## 2019-02-01 NOTE — Telephone Encounter (Signed)
Home phone/ consent/ my chart/ pre reg completed °

## 2019-02-03 NOTE — Progress Notes (Signed)
Virtual Visit via Video Note   This visit type was conducted due to national recommendations for restrictions regarding the COVID-19 Pandemic (e.g. social distancing) in an effort to limit this patient's exposure and mitigate transmission in our community.  Due to his co-morbid illnesses, this patient is at least at moderate risk for complications without adequate follow up.  This format is felt to be most appropriate for this patient at this time.  All issues noted in this document were discussed and addressed.  A limited physical exam was performed with this format.  Please refer to the patient's chart for his consent to telehealth for [facility]   Date:  02/05/2019   ID:  Brandon Ray, DOB 1934-09-30, MRN [id number]  Patient Location: Home Provider Location: Home  PCP:  Hulan Fess, MD  Cardiologist:  Oluwadarasimi Favor [place], MD  Electrophysiologist:  None   Evaluation Performed:  Follow-Up Visit  Chief Complaint:  CAD  History of Present Illness:    Brandon Ray is a 84 y.o. male with  a history of coronary disease and is status post CABG in 2004 after a NSTEMI. He had severe 3 vessel disease and normal LV function. 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 This has significantly limited his activity.  He really denies any significant cardiac complaints. He denies chest pain, shortness of breath, or palpitations. He has had some edema over the past year. Since he started using compression hose this has improved. No orthopnea or PND. He did have SBO in January 2018 that resolved with conservative measures.  He is doing well today. He has not been able to get out and do as much walking due to pandemic and the park he normally walks in is closed. He denies any chest pain, dizziness, palpitations, SOB. He wears support hose daily and swelling is under control. Neuropathy symptoms have progressed a little.  The patient does not have symptoms concerning for COVID-19  infection (fever, chills, cough, or new shortness of breath).    Past Medical History:  Diagnosis Date  . Arthritis   . BPH (benign prostatic hypertrophy) 2014  . Coronary artery disease 01/2003   CABG  . Free monoclonal light chain    urine  . History of blood transfusion 2004  . Hyperlipidemia    on medication  . Macular degeneration    both eyes  . Peripheral neuropathy 05/2011    follow up with Dr. Tish Frederickson multiple issues  . Pleural effusion 05/2011   from abdominal surgery  . Seizures ([place]) 1950's   last one 1961  . Small bowel obstruction ([place]) 05/2011   due to adhesion  . Weakness 2013-May began   lower  from neuropathy from knees down   Past Surgical History:  Procedure Laterality Date  . ABDOMINAL SURGERY  05/2011   bowel obstruction  . [place]   left  . CARDIAC CATHETERIZATION  01/27/2003   NORMAL LEFT VENTRICULAR SIZE WITH MILD FOCAL LATERAL WALL HYPOKINESIA. EF 60%  . CORONARY ARTERY BYPASS GRAFT  2004   LIMA GRAFT TO THE LAD, SAPHENOUS VEIN GRAFT TO THE DIAGONAL, SAPHENOUS VEIN GRAFT TO THE FIRST OM, SAPHENOUS VEIN GRAFT TO THE PDA  . HAND SURGERY  2000   left  . TOTAL KNEE ARTHROPLASTY Right 01/03/2018   Procedure: RIGHT TOTAL KNEE ARTHROPLASTY;  Surgeon: Paralee Cancel, MD;  Location: [facility];  Service: Orthopedics;  Laterality: Right;  . TRANSURETHRAL RESECTION OF PROSTATE  [place] N/A 12/22/2012   Procedure: TRANSURETHRAL RESECTION OF THE PROSTATE WITH GYRUS (TURP);  Surgeon: Fredricka Bonine, MD;  Location: [facility];  Service: Urology;  Laterality: N/A;     Current Meds  Medication Sig  . aspirin EC 81 MG tablet Take 81 mg by mouth daily.  Marland Kitchen atorvastatin (LIPITOR) 20 MG tablet TAKE 1 TABLET (20 MG TOTAL) BY MOUTH DAILY AT 6 PM  . calcium-vitamin D (OSCAL WITH D) 500-200 MG-UNIT per tablet Take 1 tablet by mouth daily at 2 PM.   . Cholecalciferol (VITAMIN D-3) 5000 units TABS  Take 5,000 Units by mouth every evening. Take 5000 IU 5 x week  . Cyanocobalamin (B-12) 2500 MCG TABS Take by mouth.  . Multiple Vitamins-Minerals (PRESERVISION AREDS 2 PO) Take 2 capsules by mouth at bedtime.   . Omega-3 Fatty Acids (FISH OIL) 1200 MG CAPS Take 1,200 mg by mouth at bedtime.   Marland Kitchen PHENobarbital (LUMINAL) 97.2 MG tablet Take 97.2 mg by mouth at bedtime.   . polyethylene glycol (MIRALAX / GLYCOLAX) 17 g packet Take 17 g by mouth daily.  . pregabalin (LYRICA) 150 MG capsule Take 150 mg by mouth 2 (two) times daily.  . tamsulosin (FLOMAX) 0.4 MG CAPS capsule Take 0.4 mg by mouth at bedtime.   Marland Kitchen testosterone (ANDROGEL) 50 MG/5GM (1%) GEL APPLY EXTERNALLY TO SPECIFIC AREA OF SKIN  . [DISCONTINUED] polyethylene glycol (MIRALAX / GLYCOLAX) packet Take 17 g by mouth 2 (two) times daily. (Patient taking differently: Take 17 g by mouth daily. )  . [DISCONTINUED] pregabalin (LYRICA) 75 MG capsule Take 75 mg by mouth 4 (four) times daily.     Allergies:   Amoxicillin; Boostrix [tetanus-diphth-acell pertussis]; and Other   Social History   Tobacco Use  . Smoking status: Former Smoker    Packs/day: 1.00    Years: 5.00    Pack years: 5.00    Types: Cigarettes    Last attempt to quit: 07/01/1965    Years since quitting: 53.6  . Smokeless tobacco: Never Used  Substance Use Topics  . Alcohol use: No  . Drug use: No     Family Hx: The patient's family history includes Asthma in his mother; Cancer (age of onset: 67) in his sister; Coronary artery disease in his father; Heart disease in his father; Heart disease (age of onset: 32) in his mother.  ROS:   Please see the history of present illness.    All other systems reviewed and are negative.   Prior CV studies:   The following studies were reviewed today:  none  Labs/Other Tests and Data Reviewed:    EKG:  No ECG reviewed.  Recent Labs: 10/17/2018: Hemoglobin 16.6; Platelets 157.0   Recent Lipid Panel Lab Results   Component Value Date/Time   CHOL 138 07/26/2017 11:18 AM   TRIG 66 07/26/2017 11:18 AM   HDL 70 07/26/2017 11:18 AM   CHOLHDL 2.4 09/06/2016 03:08 PM   LDLCALC 55 07/26/2017 11:18 AM   Labs dated 07/26/17: Cholesterol 138, triglycerides 66, HDL 70, LDL 55.  Dated 10/11/18: Normal CBC and CMET.   Wt Readings from Last 3 Encounters:  02/05/19 182 lb (82.6 kg)  10/17/18 189 lb (85.7 kg)  01/25/18 193 lb (87.5 kg)     Objective:    Vital Signs:  Ht $R'6\' 1"'Sx$  (1.854 m)   Wt 182 lb (82.6 kg)   BMI 24.01 kg/m    GEN:  no  acute distress  HEENT normal Respirations unlabored. Skin without rashes.  Neuro alert and oriented. Nonfocal. Mood appropriate.  ASSESSMENT & PLAN:    1. Coronary disease status post CABG in 2004. His last nuclear stress test in May of 2011 was normal. We will continue with his medical management and risk factor modification. He remains asymptomatic.   2. Hyperlipidemia. Excellent control on statin. Last lipid panel in Nov. 2018. Plan to repeat lipid panel with next lab draw.  3. PolyNeuropathy.  4. Leg edema.  Continue conservative therapy with support hose, elevation when possible, and sodium restriction.  COVID-19 Education: The signs and symptoms of COVID-19 were discussed with the patient and how to seek care for testing (follow up with PCP or arrange E-visit).  The importance of social distancing was discussed today.  Time:   Today, I have spent 15 minutes with the patient with telehealth technology discussing the above problems.     Medication Adjustments/Labs and Tests Ordered: Current medicines are reviewed at length with the patient today.  Concerns regarding medicines are outlined above.   Tests Ordered: No orders of the defined types were placed in this encounter.   Medication Changes: No orders of the defined types were placed in this encounter.   Disposition:  Follow up in 6 month(s)  Signed, Altariq Goodall [place], MD  02/05/2019 8:35 AM     [facility]

## 2019-02-05 ENCOUNTER — Telehealth (INDEPENDENT_AMBULATORY_CARE_PROVIDER_SITE_OTHER): Payer: Medicare HMO | Admitting: Cardiology

## 2019-02-05 VITALS — Ht 73.0 in | Wt 182.0 lb

## 2019-02-05 DIAGNOSIS — R600 Localized edema: Secondary | ICD-10-CM

## 2019-02-05 DIAGNOSIS — E7800 Pure hypercholesterolemia, unspecified: Secondary | ICD-10-CM

## 2019-02-05 DIAGNOSIS — I25810 Atherosclerosis of coronary artery bypass graft(s) without angina pectoris: Secondary | ICD-10-CM

## 2019-02-05 NOTE — Patient Instructions (Addendum)
Continue your current therapy  Schedule follow up appointment in 6 months  Call 3 months before to schedule

## 2019-02-14 DIAGNOSIS — H524 Presbyopia: Secondary | ICD-10-CM | POA: Diagnosis not present

## 2019-02-14 DIAGNOSIS — H04123 Dry eye syndrome of bilateral lacrimal glands: Secondary | ICD-10-CM | POA: Diagnosis not present

## 2019-02-14 DIAGNOSIS — H532 Diplopia: Secondary | ICD-10-CM | POA: Diagnosis not present

## 2019-03-05 ENCOUNTER — Telehealth: Payer: Self-pay

## 2019-03-05 NOTE — Telephone Encounter (Signed)
PA for Prolia approved from 03/07/19 to 03/06/20- ref # is [id number] I will call the patient to get him scheduled for nurse visit

## 2019-03-05 NOTE — Telephone Encounter (Signed)
Patient has been verified for Prolia and a PA is required- will get that started today

## 2019-03-06 NOTE — Telephone Encounter (Signed)
PA for Prolia was approved from 03/07/19 to 03/06/20 Auth # is [id number]

## 2019-03-08 DIAGNOSIS — H353133 Nonexudative age-related macular degeneration, bilateral, advanced atrophic without subfoveal involvement: Secondary | ICD-10-CM | POA: Diagnosis not present

## 2019-03-08 DIAGNOSIS — H499 Unspecified paralytic strabismus: Secondary | ICD-10-CM | POA: Diagnosis not present

## 2019-03-08 DIAGNOSIS — H532 Diplopia: Secondary | ICD-10-CM | POA: Diagnosis not present

## 2019-03-08 DIAGNOSIS — H353212 Exudative age-related macular degeneration, right eye, with inactive choroidal neovascularization: Secondary | ICD-10-CM | POA: Diagnosis not present

## 2019-03-14 ENCOUNTER — Other Ambulatory Visit: Payer: Self-pay

## 2019-03-14 ENCOUNTER — Telehealth: Payer: Self-pay

## 2019-03-14 DIAGNOSIS — Z471 Aftercare following joint replacement surgery: Secondary | ICD-10-CM | POA: Diagnosis not present

## 2019-03-14 DIAGNOSIS — Z96651 Presence of right artificial knee joint: Secondary | ICD-10-CM | POA: Diagnosis not present

## 2019-03-14 NOTE — Telephone Encounter (Signed)
LVM requesting patient call back to schedule nurse visit for Prolia-patient owes $265 at check-in and is ready to be scheduled-PA was approved for this from 03/07/19 to 03/06/20 Auth # is [id number]

## 2019-03-15 ENCOUNTER — Other Ambulatory Visit: Payer: Self-pay

## 2019-03-15 ENCOUNTER — Ambulatory Visit: Payer: Medicare HMO

## 2019-03-15 DIAGNOSIS — M810 Age-related osteoporosis without current pathological fracture: Secondary | ICD-10-CM

## 2019-03-15 MED ORDER — DENOSUMAB 60 MG/ML ~~LOC~~ SOSY
60.0000 mg | PREFILLED_SYRINGE | Freq: Once | SUBCUTANEOUS | Status: AC
  Administered 2019-03-15: 60 mg via SUBCUTANEOUS

## 2019-03-15 NOTE — Progress Notes (Signed)
Per orders of Dr. Cruzita Lederer injection of Prolia given today by L. Verlan Grotz CMA . Patient tolerated injection well.

## 2019-03-20 DIAGNOSIS — R69 Illness, unspecified: Secondary | ICD-10-CM | POA: Diagnosis not present

## 2019-04-12 DIAGNOSIS — H532 Diplopia: Secondary | ICD-10-CM | POA: Diagnosis not present

## 2019-04-12 DIAGNOSIS — H353212 Exudative age-related macular degeneration, right eye, with inactive choroidal neovascularization: Secondary | ICD-10-CM | POA: Diagnosis not present

## 2019-04-12 DIAGNOSIS — H499 Unspecified paralytic strabismus: Secondary | ICD-10-CM | POA: Diagnosis not present

## 2019-04-12 DIAGNOSIS — H353133 Nonexudative age-related macular degeneration, bilateral, advanced atrophic without subfoveal involvement: Secondary | ICD-10-CM | POA: Diagnosis not present

## 2019-04-18 DIAGNOSIS — H0100A Unspecified blepharitis right eye, upper and lower eyelids: Secondary | ICD-10-CM | POA: Diagnosis not present

## 2019-04-18 DIAGNOSIS — H532 Diplopia: Secondary | ICD-10-CM | POA: Diagnosis not present

## 2019-04-18 DIAGNOSIS — H0100B Unspecified blepharitis left eye, upper and lower eyelids: Secondary | ICD-10-CM | POA: Diagnosis not present

## 2019-05-14 ENCOUNTER — Other Ambulatory Visit: Payer: Self-pay | Admitting: Internal Medicine

## 2019-05-14 ENCOUNTER — Encounter: Payer: Self-pay | Admitting: Internal Medicine

## 2019-05-14 DIAGNOSIS — M810 Age-related osteoporosis without current pathological fracture: Secondary | ICD-10-CM

## 2019-05-16 ENCOUNTER — Telehealth: Payer: Self-pay

## 2019-05-16 MED ORDER — ATORVASTATIN CALCIUM 20 MG PO TABS: 20.0000 mg | ORAL_TABLET | Freq: Every day | ORAL | 0 refills | Status: DC

## 2019-05-16 NOTE — Telephone Encounter (Signed)
Requested Prescriptions   Signed Prescriptions Disp Refills  . atorvastatin (LIPITOR) 20 MG tablet 90 tablet 0    Sig: Take 1 tablet (20 mg total) by mouth daily at 6 PM.    Authorizing Provider: [place], PETER M    Ordering User: Raelene Bott, Fusaye Wachtel L

## 2019-06-26 DIAGNOSIS — R69 Illness, unspecified: Secondary | ICD-10-CM | POA: Diagnosis not present

## 2019-07-02 ENCOUNTER — Other Ambulatory Visit: Payer: Self-pay

## 2019-07-02 ENCOUNTER — Ambulatory Visit (INDEPENDENT_AMBULATORY_CARE_PROVIDER_SITE_OTHER)
Admission: RE | Admit: 2019-07-02 | Discharge: 2019-07-02 | Disposition: A | Discharge status: Home / Self Care | Payer: Medicare HMO | Source: Ambulatory Visit | Attending: Internal Medicine | Admitting: Internal Medicine

## 2019-07-02 DIAGNOSIS — M810 Age-related osteoporosis without current pathological fracture: Secondary | ICD-10-CM | POA: Diagnosis not present

## 2019-08-02 DIAGNOSIS — M79641 Pain in right hand: Secondary | ICD-10-CM | POA: Diagnosis not present

## 2019-08-02 DIAGNOSIS — G629 Polyneuropathy, unspecified: Secondary | ICD-10-CM | POA: Diagnosis not present

## 2019-08-02 DIAGNOSIS — M79642 Pain in left hand: Secondary | ICD-10-CM | POA: Diagnosis not present

## 2019-08-06 DIAGNOSIS — H532 Diplopia: Secondary | ICD-10-CM | POA: Diagnosis not present

## 2019-08-07 DIAGNOSIS — G610 Guillain-Barre syndrome: Secondary | ICD-10-CM | POA: Diagnosis not present

## 2019-08-07 DIAGNOSIS — M4802 Spinal stenosis, cervical region: Secondary | ICD-10-CM | POA: Diagnosis not present

## 2019-08-15 ENCOUNTER — Other Ambulatory Visit: Payer: Self-pay

## 2019-08-15 ENCOUNTER — Ambulatory Visit: Payer: Medicare HMO | Admitting: Cardiology

## 2019-08-15 ENCOUNTER — Encounter: Payer: Self-pay | Admitting: Cardiology

## 2019-08-15 VITALS — BP 100/54 | HR 76 | Ht 72.0 in | Wt 174.0 lb

## 2019-08-15 DIAGNOSIS — E7800 Pure hypercholesterolemia, unspecified: Secondary | ICD-10-CM

## 2019-08-15 DIAGNOSIS — I25810 Atherosclerosis of coronary artery bypass graft(s) without angina pectoris: Secondary | ICD-10-CM | POA: Diagnosis not present

## 2019-08-15 LAB — LIPID PANEL
Chol/HDL Ratio: 2.1 ratio (ref 0.0–5.0)
Cholesterol, Total: 144 mg/dL (ref 100–199)
HDL: 68 mg/dL (ref >39)
LDL Chol Calc (NIH): 62 mg/dL (ref 0–99)
Triglycerides: 68 mg/dL (ref 0–149)
VLDL Cholesterol Cal: 14 mg/dL (ref 5–40)

## 2019-08-15 MED ORDER — ATORVASTATIN CALCIUM 20 MG PO TABS: 20.0000 mg | ORAL_TABLET | Freq: Every day | ORAL | 3 refills | Status: DC

## 2019-08-15 NOTE — Progress Notes (Signed)
Brandon Ray Date of Birth: 1934/10/26   History of Present Illness: Brandon Ray is seen today for followup. He has a history of coronary disease and is status post CABG in 2004 after a NSTEMI. He had severe 3 vessel disease and normal LV function. 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 This has significantly limited his activity.  He really denies any significant cardiac complaints. He denies chest pain, shortness of breath, or palpitations. He has had some edema over the past year but this became worse over the past week. Since he started using compression hose this has improved. No orthopnea or PND. He did have SBO in January 2018 that resolved with conservative measures.  On follow up today he is feeling very well. Some progression of his neuropathy. Is having difficulty with his eyes. No chest pain, dyspnea or palpitations. Still walks regularly.   Current Outpatient Medications on File Prior to Visit  Medication Sig Dispense Refill  . aspirin EC 81 MG tablet Take 81 mg by mouth daily.    Marland Kitchen atorvastatin (LIPITOR) 20 MG tablet Take 1 tablet (20 mg total) by mouth daily at 6 PM. 90 tablet 0  . calcium-vitamin D (OSCAL WITH D) 500-200 MG-UNIT per tablet Take 1 tablet by mouth daily at 2 PM.     . Cholecalciferol (VITAMIN D-3) 5000 units TABS Take 5,000 Units by mouth every evening. Take 5000 IU 5 x week    . Cyanocobalamin (B-12) 2500 MCG TABS Take by mouth.    . Multiple Vitamins-Minerals (PRESERVISION AREDS 2 PO) Take 2 capsules by mouth at bedtime.     . Omega-3 Fatty Acids (FISH OIL) 1200 MG CAPS Take 1,200 mg by mouth at bedtime.     Marland Kitchen PHENobarbital (LUMINAL) 97.2 MG tablet Take 97.2 mg by mouth at bedtime.     . polyethylene glycol (MIRALAX / GLYCOLAX) 17 g packet Take 17 g by mouth daily.    . pregabalin (LYRICA) 150 MG capsule Take 150 mg by mouth 2 (two) times daily.    . tamsulosin (FLOMAX) 0.4 MG CAPS capsule Take 0.4 mg by mouth at bedtime.      No  current facility-administered medications on file prior to visit.     Allergies  Allergen Reactions  . Amoxicillin Anaphylaxis and Rash    Upper torso only. Has patient had a PCN reaction causing immediate rash, facial/tongue/throat swelling, SOB or lightheadedness with hypotension: No Has patient had a PCN reaction causing severe rash involving mucus membranes or skin necrosis: No Has patient had a PCN reaction that required hospitalization: No Has patient had a PCN reaction occurring within the last 10 years: Yes If all of the above answers are "NO", then may proceed with Cephalosporin use.   . Tetanus Toxoids Other (See Comments)    TDP vaccine- family stated this caused neuropathy.  . Boostrix [Tetanus-Diphth-Acell Pertussis] Other (See Comments)    Neuropathy symptoms  . Other Other (See Comments)    Flu shot-neuropathy symptoms    Past Medical History:  Diagnosis Date  . Arthritis   . BPH (benign prostatic hypertrophy) 2014  . Coronary artery disease 01/2003   CABG  . Free monoclonal light chain    urine  . History of blood transfusion 2004  . Hyperlipidemia    on medication  . Macular degeneration    both eyes  . Peripheral neuropathy 05/2011    follow up with Dr. Tish Frederickson multiple issues  . Pleural effusion  05/2011   from abdominal surgery  . Seizures ([place]) 1950's   last one 1961  . Small bowel obstruction ([place]) 05/2011   due to adhesion  . Weakness 2013-May began   lower  from neuropathy from knees down    Past Surgical History:  Procedure Laterality Date  . ABDOMINAL SURGERY  05/2011   bowel obstruction  . [place]   left  . CARDIAC CATHETERIZATION  01/27/2003   NORMAL LEFT VENTRICULAR SIZE WITH MILD FOCAL LATERAL WALL HYPOKINESIA. EF 60%  . CORONARY ARTERY BYPASS GRAFT  2004   LIMA GRAFT TO THE LAD, SAPHENOUS VEIN GRAFT TO THE DIAGONAL, SAPHENOUS VEIN GRAFT TO THE FIRST OM, SAPHENOUS VEIN GRAFT TO THE PDA  . HAND SURGERY  2000   left   . TOTAL KNEE ARTHROPLASTY Right 01/03/2018   Procedure: RIGHT TOTAL KNEE ARTHROPLASTY;  Surgeon: Paralee Cancel, MD;  Location: [facility];  Service: Orthopedics;  Laterality: Right;  . TRANSURETHRAL RESECTION OF PROSTATE  1992  . TRANSURETHRAL RESECTION OF PROSTATE N/A 12/22/2012   Procedure: TRANSURETHRAL RESECTION OF THE PROSTATE WITH GYRUS (TURP);  Surgeon: Fredricka Bonine, MD;  Location: [facility];  Service: Urology;  Laterality: N/A;    Social History   Tobacco Use  Smoking Status Former Smoker  . Packs/day: 1.00  . Years: 5.00  . Pack years: 5.00  . Types: Cigarettes  . Quit date: 07/01/1965  . Years since quitting: 54.1  Smokeless Tobacco Never Used    Social History   Substance and Sexual Activity  Alcohol Use No    Family History  Problem Relation Age of Onset  . Asthma Mother   . Heart disease Mother 46  . Coronary artery disease Father   . Heart disease Father   . Cancer Sister 6       breast    Review of Systems: As noted in history of present illness.  All other systems were reviewed and are negative.  Physical Exam: BP (!) 151/75   Pulse 76   Ht 6' (1.829 m)   Wt 174 lb (78.9 kg)   SpO2 96%   BMI 23.60 kg/m  GENERAL:  Well appearing WM in NAD HEENT:  PERRL, EOMI, sclera are clear. Oropharynx is clear. NECK:  No jugular venous distention, carotid upstroke brisk and symmetric, no bruits, no thyromegaly or adenopathy LUNGS:  Clear to auscultation bilaterally CHEST:  Unremarkable HEART:  RRR,  PMI not displaced or sustained,S1 and S2 within normal limits, no S3, no S4: no clicks, no rubs, no murmurs ABD:  Soft, nontender. BS +, no masses or bruits. No hepatomegaly, no splenomegaly EXT:  2 + pulses throughout, he is wearing leg braces bilaterally, no  Edema, no cyanosis no clubbing SKIN:  Warm and dry.  No rashes NEURO:  Alert and oriented x 3. Cranial nerves II through XII intact. PSYCH:  Cognitively intact    LABORATORY  DATA: Lab Results  Component Value Date   WBC 3.7 (L) 10/17/2018   HGB 16.6 10/17/2018   HCT 48.9 10/17/2018   PLT 157.0 10/17/2018   GLUCOSE 99 01/05/2018   CHOL 138 07/26/2017   TRIG 66 07/26/2017   HDL 70 07/26/2017   LDLCALC 55 07/26/2017   ALT 25 07/26/2017   AST 23 07/26/2017   NA 130 (L) 01/05/2018   K 4.4 01/05/2018   CL 97 (L) 01/05/2018   CREATININE 0.65 01/05/2018   BUN 11 01/05/2018   CO2 26 01/05/2018  TSH 2.65 10/19/2017   PSA 0.47 10/19/2017   INR 1.05 06/07/2012   HGBA1C 6.1 09/28/2012   Labs from [place] 10/15/15: Normal CBC and CMET.  Dated 03/31/17: normal CMET and CBC.  Dated 10/12/17: sodium 130 otherwise CMET normal. CBC normal. 10/11/18: normal CMET and CBC  Ecg today shows NSR, LAD, incomplete RBBB. No change. I have personally reviewed and interpreted this study.   Assessment / Plan: 1. Coronary disease status post CABG in 2004. His last nuclear stress test in May of 2011 was normal. We will continue with his medical management and risk factor modification. He is asymptomatic.   2. Hyperlipidemia.  on statin. Will check lipid panel today.  3. PolyNeuropathy.  4. Leg edema. Improved. Continue support hose prn.   I will follow up in one year.

## 2019-09-04 ENCOUNTER — Telehealth: Payer: Self-pay | Admitting: Internal Medicine

## 2019-09-04 NOTE — Telephone Encounter (Signed)
LM for pt to set up Prolia appt no earlier than 09/15/2019  PA has been approved and here is possibly a $70 copay

## 2019-09-05 ENCOUNTER — Telehealth: Payer: Self-pay | Admitting: Internal Medicine

## 2019-09-05 NOTE — Telephone Encounter (Signed)
Patient requests to be called at ph# [phone] re: scheduling Prolia injection (patient thinks he normally gets his Prolia injection some time around Christmas).

## 2019-09-11 NOTE — Telephone Encounter (Signed)
Pt appt on 09/18/2019

## 2019-09-18 ENCOUNTER — Other Ambulatory Visit: Payer: Self-pay

## 2019-09-18 ENCOUNTER — Ambulatory Visit: Payer: Medicare HMO

## 2019-09-18 DIAGNOSIS — M810 Age-related osteoporosis without current pathological fracture: Secondary | ICD-10-CM | POA: Diagnosis not present

## 2019-09-18 DIAGNOSIS — R69 Illness, unspecified: Secondary | ICD-10-CM | POA: Diagnosis not present

## 2019-09-18 MED ORDER — DENOSUMAB 60 MG/ML ~~LOC~~ SOSY
60.0000 mg | PREFILLED_SYRINGE | Freq: Once | SUBCUTANEOUS | Status: AC
  Administered 2019-09-18: 60 mg via SUBCUTANEOUS

## 2019-09-18 NOTE — Progress Notes (Signed)
Per orders of Dr. Gherghe injection of Prolia given today by Sriman Tally, Certified Medical Assistant . Patient tolerated injection well.  

## 2019-09-29 ENCOUNTER — Encounter: Payer: Self-pay | Admitting: Internal Medicine

## 2019-10-03 DIAGNOSIS — N400 Enlarged prostate without lower urinary tract symptoms: Secondary | ICD-10-CM | POA: Diagnosis not present

## 2019-10-03 DIAGNOSIS — N5201 Erectile dysfunction due to arterial insufficiency: Secondary | ICD-10-CM | POA: Diagnosis not present

## 2019-10-04 DIAGNOSIS — H43813 Vitreous degeneration, bilateral: Secondary | ICD-10-CM | POA: Diagnosis not present

## 2019-10-04 DIAGNOSIS — H532 Diplopia: Secondary | ICD-10-CM | POA: Diagnosis not present

## 2019-10-04 DIAGNOSIS — H353133 Nonexudative age-related macular degeneration, bilateral, advanced atrophic without subfoveal involvement: Secondary | ICD-10-CM | POA: Diagnosis not present

## 2019-10-04 DIAGNOSIS — H353212 Exudative age-related macular degeneration, right eye, with inactive choroidal neovascularization: Secondary | ICD-10-CM | POA: Diagnosis not present

## 2019-10-09 DIAGNOSIS — D472 Monoclonal gammopathy: Secondary | ICD-10-CM | POA: Diagnosis not present

## 2019-10-17 ENCOUNTER — Other Ambulatory Visit: Payer: Self-pay

## 2019-10-18 NOTE — Progress Notes (Signed)
Subjective:     Patient ID: Brandon Ray, male   DOB: 06-Jan-1935, 85 y.o.   MRN: [id number]  This visit occurred during the SARS-CoV-2 public health emergency.  Safety protocols were in place, including screening questions prior to the visit, additional usage of staff PPE, and extensive cleaning of exam room while observing appropriate contact time as indicated for disinfecting solutions.   HPI Brandon Ray is a very pleasant 85 y.o. man with h/o severe polyneuropathy and monoclonal gammopathy, returning for f/u for primary hypogonadism, osteoporosis + h/o sacral fracture, low B12. Last visit 1 year ago.  He fell few times before last visit, and had 1 fall since last visit, but did not have fractures.  He stopped testosterone in 06/2019 >> more weak.  He is interested in resuming testosterone to improve his weakness.  He has more blurry vision since last visit and has had a brain MRI to investigate this.  I reviewed the records from [facility] and the MRI did not show any pathology.  Reviewed and addended history: He continues to have severe neuropathy. He also still has discomfort in the mid thoracic region, pelvic region and his hands.  He was previously investigated for POEMS sd. - prev. Seen by Dr Lamonte Sakai, then had an evaluation by Dr. Karie Kirks. Tuchman (Heme/Onc at [facility]) for this, but it was concluded that he actually has MGUS and will be just followed for it. He also had a fat pad biopsy to rule out amyloidosis and this was negative.   Please see my previous notes regarding patient's past medical history. He does have polyneuropathy, monoclonal gammopathy, gynecomastia and primary hypogonadism, but no organomegaly or significant skin changes. He also sees Dr. Tessa Lerner in pain clinic,  Dr Trinna Post (neurology) at the [facility]. Dr. Rushie Goltz is his local neurologist ([facility]). 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   He Was on Neurontin high doses >> not helping >> sees Pain  Clinic at [facility] >> tried Cymbalta + Neurontin >> did not help >> then Lyrica, which gives him some relief.  Primary hypogonadism: - We initially started him on testosterone gel 5 g daily, then we increased to 7.5 g daily.  He could not tolerate this dose well: Increase libido, pelvic tension, breast tension.  He was then on 5 g 5 out of 7 days but he stopped this since last visit.  Reviewed pertinent labs: Component     Latest Ref Rng & Units 10/17/2018  Testosterone, Serum (Total)     ng/dL 477  % Free Testosterone     % 0.8  Free Testosterone, S     pg/mL 38 (L)  Sex Hormone Binding Globulin     nmol/L 79.6 (H)   Component     Latest Ref Rng & Units 10/19/2017  Testosterone, Serum (Total)     ng/dL 674  % Free Testosterone     % 1.1  Free Testosterone, S     pg/mL 74  Sex Hormone Binding Globulin     nmol/L 80.9 (H)   Component     Latest Ref Rng & Units 10/20/2015 10/19/2016  Testosterone     264 - 916 ng/dL 251 862  Sex Horm Binding Glob, Serum     19.3 - 76.4 nmol/L 55 71.5  Testosterone Free     6.6 - 18.1 pg/mL 34.6 (L) 10.3  Testosterone-% Free     1.6 - 2.9 % 1.4 (L)    Reviewed PSA levels: Lab  Results  Component Value Date   PSA 0.47 10/19/2017   PSA 0.20 01/31/2017   PSA 5.89 (H) 10/19/2016   PSA 0.30 10/20/2015   PSA 0.35 04/16/2015   PSA 0.48 03/13/2014   PSA 0.15 02/09/2013   Review latest hemoglobin/hematocrit: Lab Results  Component Value Date   WBC 3.7 (L) 10/17/2018   HGB 16.6 10/17/2018   HCT 48.9 10/17/2018   MCV 96.1 10/17/2018   PLT 157.0 10/17/2018  10/12/2017: 16/47.80 10/15/2015: Hb 15.8/HT 47.8 04/15/2015: Hb 16.4 (13.7-17.3)/HT 50.2 (39-49)  He sees his urologist on a regular basis.  He has DRE's every year, however, he skipped this year.  She also has a history of gynecomastia, with normal mammograms.  This is likely secondary to his primary hypogonadism.  It has not improved after starting testosterone.  Prolactin level was  normal.He also has dysesthesia at the level of his nipples, probably related to his neuropathy.  This was slightly better on Lyrica.  Patient has been on high-dose steroids for his neuropathy hy (started at 60 mg in 07/2012 >> stopped prednisone 09/2013  Patient with osteopenic BMD, however, with a sacral fracture likely secondary to steroid treatment, age, and also hypogonadism.  No fracture since last visit despite having had a fall.   Reviewed latest bone density ([facility]) reports-latest T-scores improved:  DXA 07/02/2019: L1-L4 T score of +1.1 (+6.7%*) and right femoral neck -1.1, left femoral neck -0.8 (mean femoral neck: +1.9%)  DXA 11/28/2016: L1-L4 T score of +0.4 (+3.3%*) and right femoral neck -1.3, left femoral neck -0.8 (mean femoral neck: +3.0%)  DXA 11/10/2014: L1-L4 T score of +0.1 and right femoral neck -1.4, left femoral neck -1.2 DXA 11/06/2012: L1-L4 T score of -0.9 and dual femur T score of -1.1  We started Prolia and he had a total of 10 injections, last 09/18/2019.  No side effects from Prolia.  No jaw or thigh/hip pain.  No dental work in progress.  Latest vitamin D levels were normal: Lab Results  Component Value Date   VD25OH 91.84 10/17/2018   VD25OH 62.54 10/19/2017   VD25OH 58.28 10/19/2016   VD25OH 54.39 10/20/2015   VD25OH 64.27 04/16/2015   VD25OH 56.34 10/04/2014   VD25OH 67 09/14/2013   VD25OH 55 09/28/2012  He continues on 5000 units vitamin D 5/7 days + 400 units vitamin D- 600 mg calcium daily.  He has a history of low B12: Lab Results  Component Value Date   VITAMINB12 1,251 (H) 10/17/2018   VITAMINB12 1,309 (H) 10/19/2017   VITAMINB12 >1500 (H) 01/31/2017   VITAMINB12 225 10/19/2016  He continues on B12 p.o. 5000 mcg every other day  Review latest CBC from [place] from 10/11/2019 and this was normal.  He continues on phenobarbital since 1961.   Review of Systems + see HPI  Constitutional: no weight gain/no weight loss, no fatigue, no  subjective hyperthermia, no subjective hypothermia Eyes: + blurry vision, no xerophthalmia ENT: no sore throat, no nodules palpated in neck, no dysphagia, no odynophagia, no hoarseness Cardiovascular: no CP/no SOB/no palpitations/no leg swelling Respiratory: no cough/no SOB/no wheezing Gastrointestinal: no N/no V/no D/no C/no acid reflux Musculoskeletal: + muscle aches/no joint aches Skin: no rashes, no hair loss Neurological: no tremors/+ numbness/+ tingling/no dizziness  I reviewed pt's medications, allergies, PMH, social hx, family hx, and changes were documented in the history of present illness. Otherwise, unchanged from my initial visit note.  Past Medical History:  Diagnosis Date  . Arthritis   . BPH (benign prostatic  hypertrophy) 2014  . Coronary artery disease 01/2003   CABG  . Free monoclonal light chain    urine  . History of blood transfusion 2004  . Hyperlipidemia    on medication  . Macular degeneration    both eyes  . Peripheral neuropathy 05/2011    follow up with Dr. Tish Frederickson multiple issues  . Pleural effusion 05/2011   from abdominal surgery  . Seizures ([place]) 1950's   last one 1961  . Small bowel obstruction ([place]) 05/2011   due to adhesion  . Weakness 2013-May began   lower  from neuropathy from knees down   Past Surgical History:  Procedure Laterality Date  . ABDOMINAL SURGERY  05/2011   bowel obstruction  . [place]   left  . CARDIAC CATHETERIZATION  01/27/2003   NORMAL LEFT VENTRICULAR SIZE WITH MILD FOCAL LATERAL WALL HYPOKINESIA. EF 60%  . CORONARY ARTERY BYPASS GRAFT  2004   LIMA GRAFT TO THE LAD, SAPHENOUS VEIN GRAFT TO THE DIAGONAL, SAPHENOUS VEIN GRAFT TO THE FIRST OM, SAPHENOUS VEIN GRAFT TO THE PDA  . HAND SURGERY  2000   left  . TOTAL KNEE ARTHROPLASTY Right 01/03/2018   Procedure: RIGHT TOTAL KNEE ARTHROPLASTY;  Surgeon: Paralee Cancel, MD;  Location: [facility];  Service: Orthopedics;  Laterality: Right;  . TRANSURETHRAL  RESECTION OF PROSTATE  1992  . TRANSURETHRAL RESECTION OF PROSTATE N/A 12/22/2012   Procedure: TRANSURETHRAL RESECTION OF THE PROSTATE WITH GYRUS (TURP);  Surgeon: Fredricka Bonine, MD;  Location: [facility];  Service: Urology;  Laterality: N/A;   Social History   Socioeconomic History  . Marital status: Married    Spouse name: Not on file  . Number of children: 4  . Years of education: Not on file  . Highest education level: Not on file  Occupational History  . Occupation: Freight forwarder    Comment: retired, Personnel officer.   Tobacco Use  . Smoking status: Former Smoker    Packs/day: 1.00    Years: 5.00    Pack years: 5.00    Types: Cigarettes    Quit date: 07/01/1965    Years since quitting: 54.3  . Smokeless tobacco: Never Used  Substance and Sexual Activity  . Alcohol use: No  . Drug use: No  . Sexual activity: Not on file  Other Topics Concern  . Not on file  Social History Narrative  . Not on file   Social Determinants of Health   Financial Resource Strain:   . Difficulty of Paying Living Expenses: Not on file  Food Insecurity:   . Worried About Charity fundraiser in the Last Year: Not on file  . Ran Out of Food in the Last Year: Not on file  Transportation Needs:   . Lack of Transportation (Medical): Not on file  . Lack of Transportation (Non-Medical): Not on file  Physical Activity:   . Days of Exercise per Week: Not on file  . Minutes of Exercise per Session: Not on file  Stress:   . Feeling of Stress : Not on file  Social Connections:   . Frequency of Communication with Friends and Family: Not on file  . Frequency of Social Gatherings with Friends and Family: Not on file  . Attends Religious Services: Not on file  . Active Member of Clubs or Organizations: Not on file  . Attends Archivist Meetings: Not on file  . Marital Status: Not on file  Intimate  Partner Violence:   . Fear of Current or Ex-Partner: Not on file   . Emotionally Abused: Not on file  . Physically Abused: Not on file  . Sexually Abused: Not on file   Current Outpatient Medications on File Prior to Visit  Medication Sig Dispense Refill  . aspirin EC 81 MG tablet Take 81 mg by mouth daily.    Marland Kitchen atorvastatin (LIPITOR) 20 MG tablet Take 1 tablet (20 mg total) by mouth daily at 6 PM. 90 tablet 3  . calcium-vitamin D (OSCAL WITH D) 500-200 MG-UNIT per tablet Take 1 tablet by mouth daily at 2 PM.     . Cholecalciferol (VITAMIN D-3) 5000 units TABS Take 5,000 Units by mouth every evening. Take 5000 IU 5 x week    . Cyanocobalamin (B-12) 2500 MCG TABS Take by mouth.    . Multiple Vitamins-Minerals (PRESERVISION AREDS 2 PO) Take 2 capsules by mouth at bedtime.     . Omega-3 Fatty Acids (FISH OIL) 1200 MG CAPS Take 1,200 mg by mouth at bedtime.     Marland Kitchen PHENobarbital (LUMINAL) 97.2 MG tablet Take 97.2 mg by mouth at bedtime.     . polyethylene glycol (MIRALAX / GLYCOLAX) 17 g packet Take 17 g by mouth daily.    . pregabalin (LYRICA) 150 MG capsule Take 150 mg by mouth 2 (two) times daily.    . tamsulosin (FLOMAX) 0.4 MG CAPS capsule Take 0.4 mg by mouth at bedtime.      No current facility-administered medications on file prior to visit.   Allergies  Allergen Reactions  . Amoxicillin Anaphylaxis and Rash    Upper torso only. Has patient had a PCN reaction causing immediate rash, facial/tongue/throat swelling, SOB or lightheadedness with hypotension: No Has patient had a PCN reaction causing severe rash involving mucus membranes or skin necrosis: No Has patient had a PCN reaction that required hospitalization: No Has patient had a PCN reaction occurring within the last 10 years: Yes If all of the above answers are "NO", then may proceed with Cephalosporin use.   . Tetanus Toxoids Other (See Comments)    TDP vaccine- family stated this caused neuropathy.  . Boostrix [Tetanus-Diphth-Acell Pertussis] Other (See Comments)    Neuropathy symptoms   . Other Other (See Comments)    Flu shot-neuropathy symptoms   Family History  Problem Relation Age of Onset  . Asthma Mother   . Heart disease Mother 85  . Coronary artery disease Father   . Heart disease Father   . Cancer Sister 25       breast    Objective:   Physical Exam There were no vitals taken for this visit. There is no height or weight on file to calculate BMI. Wt Readings from Last 3 Encounters:  08/15/19 174 lb (78.9 kg)  02/05/19 182 lb (82.6 kg)  10/17/18 189 lb (85.7 kg)   Constitutional: overweight, in NAD, walks with a cane Eyes: PERRLA, EOMI, no exophthalmos ENT: moist mucous membranes, no thyromegaly, no cervical lymphadenopathy Cardiovascular: RRR, No MRG, + bilateral pitting edema Respiratory: CTA B Gastrointestinal: abdomen soft, NT, ND, BS+ Musculoskeletal: no deformities, strength intact in all 4 Skin: moist, warm, no rashes Neurological: no tremor with outstretched hands, DTR normal in all 4  Assessment:     1. Primary hypogonadism - + gynecomastia bilaterally - per mammography + testicular atrophy - He is on testosterone gel >> a sensation of pressure in abdomen, but this did not subside after stopping testosterone gel  for 1 mo after his surgery for Transurethral resection of prostate residual growth in 12/22/2012.   2. Osteoporosis - h/o sacral fracture, right big toe fracture (04/2013), spine X-ray: no vb fx - Off steroids now - on adequate Ca+vit D - On Prolia, no side effects  3.  Low vitamin B12  4. Polyneuropathy - unlikely POEMS syndrome per evaluation by Hem/Onc at [facility] - amyloidosis ruled out by negative fat pad biopsy - managed by Dr. Trinna Post at [facility] and Dr. Doree Albee at [facility]   5. Long-term use of steroids - previously on prednisone 60 mg starting at the end of 2013 - Was able to taper >> now off since 09/2013  6.  MGUS - monoclonal gammopathy - now managed by Dr.Tuchman at [facility], previously Dr. Lamonte Sakai      Plan:     1. Primary hypogonadism -Patient was on AndroGel 1% 5 g 5/7 days.  He was missing doses on partners when his grandchildren were visiting, to avoid cancer.  However, he never felt a significant benefit from the testosterone and even had pelvic pressure (but it was not clear whether this was related to testosterone treatment since he held testosterone in the past and was still experiencing the symptoms).  He continues to see Dr. Junious Silk with urology and he is up-to-date with his DRE's.  At last visit, his testosterone level was slightly low, but he expressed his wish to maybe come off testosterone if possible after the latest bone density returned.  Since this was improved, and he is on good treatment with Prolia, it is ok to stay off testosterone for now from the bones point of view, however, he is interested in restarting this due to the weakness that he developed in the last 3 months and stopping testosterone -We will recheck his testosterone and PSA today.  CBC was reviewed from [place] and this was recently normal.  2. Osteoporosis  -He has a history of osteopenia on DXA scans but he does have a history of sacral fracture, giving him a diagnosis of osteoporosis -He has been on prednisone and Dilantin in the past (now off) and also on testosterone replacement.  No recent fractures, no side effects from Prolia.  He had a total of 10 injections so far, last on 09/18/2019. -His latest bone mineral density was 07/02/2019 and showed stable BMD at that hand, and improvement in the spine -Our initial plan was to continue Prolia for at least 6 years and then switch to a bisphosphonate, but studies show that fracture risk continues to decrease even after 10 years of Prolia so for now we can aim for this treatment period. -He continues on 5000 units vitamin D 5 out of 7 days now.  We will recheck the level today -Continues on 600 mg daily calcium -I will see him back in a year  3.  Low vitamin  B12 -We checked his B12 in the past during investigation for possible causes for neuropathy.  This returned low so we started 5000 mcg vitamin B12 daily.  We change this to every other day 01/2017.  -Latest B12 level reviewed and this was only slightly high, on 5000 units every other day -We will recheck his B12 today  Orders Placed This Encounter  Procedures  . Vitamin B12  . Vitamin D, 25-hydroxy  . Testosterone Free with SHBG  . PSA   CC: Drs. Katy Fitch  - time spent with the patient and reviewing records since last visit (including  labs from [place], bone density scan from 06/2019, brain MRI from [facility]): 40 min.  A significant amount of time was also spent obtaining information about his symptoms, reviewing his previous labs (from last visit),  and treatments, counseling him about his conditions (please see the discussed topics above), and developing a plan to further investigate and treat them, discussing about the coronavirus vaccine; he had a number of questions which I addressed.  Component     Latest Ref Rng & Units 10/19/2019  Testosterone, Serum (Total)     ng/dL 24 (L)  % Free Testosterone     % 0.6  Free Testosterone, S     pg/mL 1.4 (L)  Sex Hormone Binding Globulin     nmol/L 100.7 (H)  PSA     0.10 - 4.00 ng/mL 0.10  VITD     30.00 - 100.00 ng/mL 67.06  Vitamin B12     211 - 911 pg/mL >1500 (H)   Testosterone level is very low.  I will discuss with him if he wants to restart testosterone supplement. PSA and vitamin D excellent. Vitamin B12 is higher than target.  For now, we can reduce the dose of vitamin D to 5000 units twice a week.  Philemon Kingdom, MD PhD [facility]

## 2019-10-19 ENCOUNTER — Other Ambulatory Visit: Payer: Self-pay

## 2019-10-19 ENCOUNTER — Encounter: Payer: Self-pay | Admitting: Internal Medicine

## 2019-10-19 ENCOUNTER — Ambulatory Visit: Payer: Medicare HMO | Admitting: Internal Medicine

## 2019-10-19 VITALS — BP 120/60 | HR 67 | Ht 72.0 in | Wt 176.0 lb

## 2019-10-19 DIAGNOSIS — E538 Deficiency of other specified B group vitamins: Secondary | ICD-10-CM | POA: Diagnosis not present

## 2019-10-19 DIAGNOSIS — M818 Other osteoporosis without current pathological fracture: Secondary | ICD-10-CM

## 2019-10-19 DIAGNOSIS — E291 Testicular hypofunction: Secondary | ICD-10-CM | POA: Diagnosis not present

## 2019-10-19 LAB — VITAMIN D 25 HYDROXY (VIT D DEFICIENCY, FRACTURES): VITD: 67.06 ng/mL (ref 30.00–100.00)

## 2019-10-19 LAB — VITAMIN B12: Vitamin B-12: >1500 pg/mL — ABNORMAL HIGH (ref 211–911)

## 2019-10-19 LAB — PSA: PSA: 0.1 ng/mL (ref 0.10–4.00)

## 2019-10-19 NOTE — Patient Instructions (Signed)
Please stop at the lab.  Please come back for a follow-up appointment in 1 year.  

## 2019-10-24 LAB — TESTOSTERONE, FREE AND TOTAL (INCLUDES SHBG)-(MALES)
% Free Testosterone: 0.6 %
Free Testosterone, S: 1.4 pg/mL — ABNORMAL LOW
Sex Hormone Binding Globulin: 100.7 nmol/L — ABNORMAL HIGH
Testosterone, Serum (Total): 24 ng/dL — ABNORMAL LOW

## 2019-10-26 ENCOUNTER — Encounter: Payer: Self-pay | Admitting: Internal Medicine

## 2019-10-26 ENCOUNTER — Other Ambulatory Visit: Payer: Self-pay | Admitting: Internal Medicine

## 2019-10-26 MED ORDER — TESTOSTERONE 50 MG/5GM (1%) TD GEL: TRANSDERMAL | 3 refills | Status: DC

## 2019-10-29 ENCOUNTER — Telehealth: Payer: Self-pay

## 2019-10-29 ENCOUNTER — Ambulatory Visit: Payer: Medicare HMO | Admitting: Cardiology

## 2019-10-29 NOTE — Telephone Encounter (Signed)
Prior authorization for Testosterone gel has been approved by patient's insurance.  Coverage is effective 09/21/2019 to 09/19/2020  Approval letter has been sent to scanning.

## 2019-11-14 ENCOUNTER — Encounter: Payer: Self-pay | Admitting: Internal Medicine

## 2019-11-15 ENCOUNTER — Other Ambulatory Visit: Payer: Self-pay

## 2019-11-15 ENCOUNTER — Other Ambulatory Visit: Payer: Self-pay | Admitting: Internal Medicine

## 2019-11-15 ENCOUNTER — Other Ambulatory Visit (INDEPENDENT_AMBULATORY_CARE_PROVIDER_SITE_OTHER): Payer: Medicare HMO

## 2019-11-15 DIAGNOSIS — R7401 Elevation of levels of liver transaminase levels: Secondary | ICD-10-CM

## 2019-11-15 LAB — HEPATIC FUNCTION PANEL
ALT: 49 U/L (ref 0–53)
AST: 34 U/L (ref 0–37)
Albumin: 3.9 g/dL (ref 3.5–5.2)
Alkaline Phosphatase: 72 U/L (ref 39–117)
Bilirubin, Direct: 0.1 mg/dL (ref 0.0–0.3)
Total Bilirubin: 0.4 mg/dL (ref 0.2–1.2)
Total Protein: 6.3 g/dL (ref 6.0–8.3)

## 2019-11-16 DIAGNOSIS — H04123 Dry eye syndrome of bilateral lacrimal glands: Secondary | ICD-10-CM | POA: Diagnosis not present

## 2019-11-16 DIAGNOSIS — H26492 Other secondary cataract, left eye: Secondary | ICD-10-CM | POA: Diagnosis not present

## 2019-11-16 DIAGNOSIS — H524 Presbyopia: Secondary | ICD-10-CM | POA: Diagnosis not present

## 2019-11-16 DIAGNOSIS — H532 Diplopia: Secondary | ICD-10-CM | POA: Diagnosis not present

## 2019-11-19 ENCOUNTER — Telehealth: Payer: Self-pay

## 2019-11-19 NOTE — Telephone Encounter (Signed)
This has been faxed electronically.

## 2019-11-19 NOTE — Telephone Encounter (Signed)
-----   Message from Eliott Nine sent at 11/15/2019  2:17 PM EST ----- Hello, Lenna Sciara when the patients results are completed he would like his test results to be faxed to his Neurologist Dr. Rushie Goltz, fax [phone]. Phone: [phone].  I just collected the labs so results should be completed by tomorrow.

## 2019-12-07 ENCOUNTER — Other Ambulatory Visit: Payer: Self-pay | Admitting: Internal Medicine

## 2019-12-07 ENCOUNTER — Encounter: Payer: Self-pay | Admitting: Internal Medicine

## 2019-12-07 MED ORDER — TESTOSTERONE 50 MG/5GM (1%) TD GEL: TRANSDERMAL | 3 refills | Status: DC

## 2019-12-27 DIAGNOSIS — R69 Illness, unspecified: Secondary | ICD-10-CM | POA: Diagnosis not present

## 2020-03-03 ENCOUNTER — Telehealth: Payer: Self-pay

## 2020-03-03 NOTE — Telephone Encounter (Signed)
Patient has been submitted to [place] Portal to get summary of benefits in order to verify coverage for Prolia

## 2020-03-12 NOTE — Telephone Encounter (Signed)
[employer] Portal required insurance phone number. This was added, and resubmitted.

## 2020-03-13 NOTE — Telephone Encounter (Signed)
PA initiated via [email or web address] for Prolia.  [name] (Key: [id number])Need help? Call us at [phone] Status Sent to [employer] Next [employer] The plan will fax you a determination, typically within 1 to 5 business days.  How do I follow up? Drug Prolia $RemoveBef'60MG'UrREdZcbxf$ /ML syringes Form [employer] Specialty Prolia and [place] See Precertification Request Form Precertification Request for Letta Kocher for Commercial Members [phone]

## 2020-03-21 ENCOUNTER — Ambulatory Visit: Payer: Medicare HMO

## 2020-03-21 NOTE — Telephone Encounter (Signed)
PA for Prolia has been approved. Approved from 03/13/20 through 03/13/2021. Pt will owe $287 since price changed for second quarter of 2021.

## 2020-03-25 ENCOUNTER — Ambulatory Visit: Payer: Medicare HMO

## 2020-03-25 ENCOUNTER — Other Ambulatory Visit: Payer: Self-pay

## 2020-03-25 DIAGNOSIS — M818 Other osteoporosis without current pathological fracture: Secondary | ICD-10-CM | POA: Diagnosis not present

## 2020-03-25 MED ORDER — DENOSUMAB 60 MG/ML ~~LOC~~ SOSY
60.0000 mg | PREFILLED_SYRINGE | Freq: Once | SUBCUTANEOUS | Status: AC
  Administered 2020-03-25: 60 mg via SUBCUTANEOUS

## 2020-03-25 NOTE — Progress Notes (Signed)
Per orders of Dr.Gherghe injection of Prolia 60mg given today by N.Mariem Skolnick,LPN. Patient tolerated injection well.  

## 2020-04-03 ENCOUNTER — Other Ambulatory Visit: Payer: Self-pay

## 2020-04-03 ENCOUNTER — Encounter (INDEPENDENT_AMBULATORY_CARE_PROVIDER_SITE_OTHER): Payer: Self-pay | Admitting: Ophthalmology

## 2020-04-03 ENCOUNTER — Ambulatory Visit (INDEPENDENT_AMBULATORY_CARE_PROVIDER_SITE_OTHER): Payer: Medicare HMO | Admitting: Ophthalmology

## 2020-04-03 DIAGNOSIS — H353212 Exudative age-related macular degeneration, right eye, with inactive choroidal neovascularization: Secondary | ICD-10-CM | POA: Insufficient documentation

## 2020-04-03 DIAGNOSIS — H532 Diplopia: Secondary | ICD-10-CM | POA: Insufficient documentation

## 2020-04-03 DIAGNOSIS — H353133 Nonexudative age-related macular degeneration, bilateral, advanced atrophic without subfoveal involvement: Secondary | ICD-10-CM | POA: Diagnosis not present

## 2020-04-03 DIAGNOSIS — H43393 Other vitreous opacities, bilateral: Secondary | ICD-10-CM | POA: Insufficient documentation

## 2020-04-03 DIAGNOSIS — H43813 Vitreous degeneration, bilateral: Secondary | ICD-10-CM | POA: Insufficient documentation

## 2020-04-03 NOTE — Assessment & Plan Note (Signed)

## 2020-04-03 NOTE — Progress Notes (Signed)
04/03/2020     CHIEF COMPLAINT Patient presents for Retina Follow Up   HISTORY OF PRESENT ILLNESS: Brandon Ray is a 85 y.o. male who presents to the clinic today for:   HPI    Retina Follow Up    Patient presents with  PVD.  In both eyes.  Duration of 6 months.  Since onset it is stable.          Comments    6 month follow up - OCT OU Patient denies change in vision and overall has no complaints.        Last edited by Berenice Bouton on 04/03/2020 10:47 AM. (History)      Referring physician: Catha Gosselin, MD [address] [place],  [place] [address]  HISTORICAL INFORMATION:   Selected notes from the MEDICAL RECORD NUMBER    Lab Results  Component Value Date   HGBA1C 6.1 09/28/2012     CURRENT MEDICATIONS: No current outpatient medications on file. (Ophthalmic Drugs)   No current facility-administered medications for this visit. (Ophthalmic Drugs)   Current Outpatient Medications (Other)  Medication Sig  . aspirin EC 81 MG tablet Take 81 mg by mouth daily.  Marland Kitchen atorvastatin (LIPITOR) 20 MG tablet Take 1 tablet (20 mg total) by mouth daily at 6 PM.  . calcium-vitamin D (OSCAL WITH D) 500-200 MG-UNIT per tablet Take 1 tablet by mouth daily at 2 PM.   . Cholecalciferol (VITAMIN D-3) 5000 units TABS Take 5,000 Units by mouth every evening. Take 5000 IU 5 x week  . Cyanocobalamin (B-12) 2500 MCG TABS Take by mouth.  . Multiple Vitamins-Minerals (PRESERVISION AREDS 2 PO) Take 2 capsules by mouth at bedtime.   . Omega-3 Fatty Acids (FISH OIL) 1200 MG CAPS Take 1,200 mg by mouth at bedtime.   Marland Kitchen PHENobarbital (LUMINAL) 97.2 MG tablet Take 97.2 mg by mouth at bedtime.   . polyethylene glycol (MIRALAX / GLYCOLAX) 17 g packet Take 17 g by mouth daily.  . pregabalin (LYRICA) 150 MG capsule Take 150 mg by mouth 2 (two) times daily.  . tamsulosin (FLOMAX) 0.4 MG CAPS capsule Take 0.4 mg by mouth at bedtime.   Marland Kitchen testosterone (ANDROGEL) 50 MG/5GM (1%) GEL Apply 5 g on  skin 5 out of 7 days   No current facility-administered medications for this visit. (Other)      REVIEW OF SYSTEMS:    ALLERGIES Allergies  Allergen Reactions  . Amoxicillin Anaphylaxis and Rash    Upper torso only. Has patient had a PCN reaction causing immediate rash, facial/tongue/throat swelling, SOB or lightheadedness with hypotension: No Has patient had a PCN reaction causing severe rash involving mucus membranes or skin necrosis: No Has patient had a PCN reaction that required hospitalization: No Has patient had a PCN reaction occurring within the last 10 years: Yes If all of the above answers are "NO", then may proceed with Cephalosporin use.   . Tetanus Toxoids Other (See Comments)    TDP vaccine- family stated this caused neuropathy.  . Boostrix [Tetanus-Diphth-Acell Pertussis] Other (See Comments)    Neuropathy symptoms  . Other Other (See Comments)    Flu shot-neuropathy symptoms    PAST MEDICAL HISTORY Past Medical History:  Diagnosis Date  . Arthritis   . BPH (benign prostatic hypertrophy) 2014  . Coronary artery disease 01/2003   CABG  . Free monoclonal light chain    urine  . History of blood transfusion 2004  . Hyperlipidemia    on  medication  . Macular degeneration    both eyes  . Peripheral neuropathy 05/2011    follow up with Dr. Tish Frederickson multiple issues  . Pleural effusion 05/2011   from abdominal surgery  . Seizures ([place]) 1950's   last one 1961  . Small bowel obstruction ([place]) 05/2011   due to adhesion  . Weakness 2013-May began   lower  from neuropathy from knees down   Past Surgical History:  Procedure Laterality Date  . ABDOMINAL SURGERY  05/2011   bowel obstruction  . [place]   left  . CARDIAC CATHETERIZATION  01/27/2003   NORMAL LEFT VENTRICULAR SIZE WITH MILD FOCAL LATERAL WALL HYPOKINESIA. EF 60%  . CORONARY ARTERY BYPASS GRAFT  2004   LIMA GRAFT TO THE LAD, SAPHENOUS VEIN GRAFT TO THE DIAGONAL, SAPHENOUS VEIN  GRAFT TO THE FIRST OM, SAPHENOUS VEIN GRAFT TO THE PDA  . HAND SURGERY  2000   left  . TOTAL KNEE ARTHROPLASTY Right 01/03/2018   Procedure: RIGHT TOTAL KNEE ARTHROPLASTY;  Surgeon: Paralee Cancel, MD;  Location: [facility];  Service: Orthopedics;  Laterality: Right;  . TRANSURETHRAL RESECTION OF PROSTATE  1992  . TRANSURETHRAL RESECTION OF PROSTATE N/A 12/22/2012   Procedure: TRANSURETHRAL RESECTION OF THE PROSTATE WITH GYRUS (TURP);  Surgeon: Fredricka Bonine, MD;  Location: [facility];  Service: Urology;  Laterality: N/A;    FAMILY HISTORY Family History  Problem Relation Age of Onset  . Asthma Mother   . Heart disease Mother 40  . Coronary artery disease Father   . Heart disease Father   . Cancer Sister 42       breast    SOCIAL HISTORY Social History   Tobacco Use  . Smoking status: Former Smoker    Packs/day: 1.00    Years: 5.00    Pack years: 5.00    Types: Cigarettes    Quit date: 07/01/1965    Years since quitting: 54.7  . Smokeless tobacco: Never Used  Vaping Use  . Vaping Use: Never used  Substance Use Topics  . Alcohol use: No  . Drug use: No         OPHTHALMIC EXAM:  Base Eye Exam    Visual Acuity (Snellen - Linear)      Right Left   Dist [place] 20/30-2 20/25-2       Tonometry (Tonopen, 10:52 AM)      Right Left   Pressure 8 8       Pupils      Pupils Dark Light Shape React APD   Right PERRL 4 3 Round Slow None   Left PERRL 4 3 Round Slow None       Visual Fields (Counting fingers)      Left Right    Full Full       Extraocular Movement      Right Left    Full Full       Neuro/Psych    Oriented x3: Yes   Mood/Affect: Normal       Dilation    Both eyes: 1.0% Mydriacyl, 2.5% Phenylephrine @ 10:52 AM        Slit Lamp and Fundus Exam    External Exam      Right Left   External Normal Normal       Slit Lamp Exam      Right Left   Lids/Lashes Normal Normal   Conjunctiva/Sclera White and quiet White and quiet  Cornea Clear Clear   Anterior Chamber Deep and quiet Deep and quiet   Iris Round and reactive Round and reactive   Lens Posterior chamber intraocular lens Posterior chamber intraocular lens   Anterior Vitreous Normal Normal       Fundus Exam      Right Left   Posterior Vitreous Central vitreous floaters, Posterior vitreous detachment Posterior vitreous detachment   Disc Normal Normal   C/D Ratio 0.25 0.45   Macula Geographic atrophy, Soft drusen, no hemorrhage, no exudates, no macular thickening Retinal pigment epithelial mottling, no hemorrhage, Soft drusen, no exudates, no macular thickening   Vessels Normal Normal   Periphery Normal Normal          IMAGING AND PROCEDURES  Imaging and Procedures for 04/03/20  OCT, Retina - OU - Both Eyes       Right Eye Quality was good. Scan locations included subfoveal. Central Foveal Thickness: 266. Progression has been stable. Findings include retinal drusen .   Left Eye Quality was good. Scan locations included subfoveal. Central Foveal Thickness: 293. Progression has been stable. Findings include retinal drusen .   Notes No signs of CN VM OU.                ASSESSMENT/PLAN:  Advanced nonexudative age-related macular degeneration of both eyes without subfoveal involvement The nature of age--related macular degeneration was discussed with the patient as well as the distinction between dry and wet types. Checking an Amsler Grid daily with advice to return immediately should a distortion develop, was given to the patient. The patient 's smoking status now and in the past was determined and advice based on the AREDS study was provided regarding the consumption of antioxidant supplements. AREDS 2 vitamin formulation was recommended. Consumption of dark leafy vegetables and fresh fruits of various colors was recommended. Treatment modalities for wet macular degeneration particularly the use of intravitreal injections of anti-blood  vessel growth factors was discussed with the patient. Avastin, Lucentis, and Eylea are the available options. On occasion, therapy includes the use of photodynamic therapy and thermal laser. Stressed to the patient do not rub eyes.  Patient was advised to check Amsler Grid daily and return immediately if changes are noted. Instructions on using the grid were given to the patient. All patient questions were answered.      ICD-10-CM   1. Advanced nonexudative age-related macular degeneration of both eyes without subfoveal involvement  H35.3133 OCT, Retina - OU - Both Eyes  2. Posterior vitreous detachment of both eyes  H43.813 OCT, Retina - OU - Both Eyes  3. Exudative age-related macular degeneration of right eye with inactive choroidal neovascularization ([place])  H35.3212   4. Diplopia  H53.2   5. Vitreous floaters of both eyes  H43.393     1.  Follow-up Dr. Satira Sark  As scheduled for refractive assistance.  2.  OU, history of wet ARMD, no active lesions now.  We will continue to use oral vitamins to slow the progression.  3.  Ophthalmic Meds Ordered this visit:  No orders of the defined types were placed in this encounter.      Return in about 9 months (around 01/02/2021) for DILATE OU, COLOR FP, OCT.  There are no Patient Instructions on file for this visit.   Explained the diagnoses, plan, and follow up with the patient and they expressed understanding.  Patient expressed understanding of the importance of proper follow up care.   Clent Demark Amorette Charrette M.D. Diseases &  Surgery of the Retina and Vitreous Retina [facility] Diabetic [place] 04/03/20     Abbreviations: M myopia (nearsighted); A astigmatism; H hyperopia (farsighted); P presbyopia; Mrx spectacle prescription;  CTL contact lenses; OD right eye; OS left eye; OU both eyes  XT exotropia; ET esotropia; PEK punctate epithelial keratitis; PEE punctate epithelial erosions; DES dry eye syndrome; MGD meibomian gland dysfunction; ATs  artificial tears; PFAT's preservative free artificial tears; [place] nuclear sclerotic cataract; PSC posterior subcapsular cataract; ERM epi-retinal membrane; PVD posterior vitreous detachment; RD retinal detachment; DM diabetes mellitus; DR diabetic retinopathy; NPDR non-proliferative diabetic retinopathy; PDR proliferative diabetic retinopathy; CSME clinically significant macular edema; DME diabetic macular edema; dbh dot blot hemorrhages; CWS cotton wool spot; POAG primary open angle glaucoma; C/D cup-to-disc ratio; HVF humphrey visual field; GVF goldmann visual field; OCT optical coherence tomography; IOP intraocular pressure; BRVO Branch retinal vein occlusion; CRVO central retinal vein occlusion; CRAO central retinal artery occlusion; BRAO branch retinal artery occlusion; RT retinal tear; SB scleral buckle; PPV pars plana vitrectomy; VH Vitreous hemorrhage; PRP panretinal laser photocoagulation; IVK intravitreal kenalog; VMT vitreomacular traction; MH Macular hole;  NVD neovascularization of the disc; NVE neovascularization elsewhere; AREDS age related eye disease study; ARMD age related macular degeneration; POAG primary open angle glaucoma; EBMD epithelial/anterior basement membrane dystrophy; ACIOL anterior chamber intraocular lens; IOL intraocular lens; PCIOL posterior chamber intraocular lens; Phaco/IOL phacoemulsification with intraocular lens placement; [place] photorefractive keratectomy; LASIK laser assisted in situ keratomileusis; HTN hypertension; DM diabetes mellitus; COPD chronic obstructive pulmonary disease

## 2020-04-28 DIAGNOSIS — M792 Neuralgia and neuritis, unspecified: Secondary | ICD-10-CM | POA: Diagnosis not present

## 2020-04-28 DIAGNOSIS — Z8669 Personal history of other diseases of the nervous system and sense organs: Secondary | ICD-10-CM | POA: Diagnosis not present

## 2020-04-28 DIAGNOSIS — G610 Guillain-Barre syndrome: Secondary | ICD-10-CM | POA: Diagnosis not present

## 2020-04-28 DIAGNOSIS — D472 Monoclonal gammopathy: Secondary | ICD-10-CM | POA: Diagnosis not present

## 2020-05-28 DIAGNOSIS — C44629 Squamous cell carcinoma of skin of left upper limb, including shoulder: Secondary | ICD-10-CM | POA: Diagnosis not present

## 2020-05-28 DIAGNOSIS — D485 Neoplasm of uncertain behavior of skin: Secondary | ICD-10-CM | POA: Diagnosis not present

## 2020-05-29 DIAGNOSIS — R69 Illness, unspecified: Secondary | ICD-10-CM | POA: Diagnosis not present

## 2020-07-10 DIAGNOSIS — C44629 Squamous cell carcinoma of skin of left upper limb, including shoulder: Secondary | ICD-10-CM | POA: Diagnosis not present

## 2020-08-15 NOTE — Progress Notes (Signed)
Brandon Ray Date of Birth: [birth year]   History of Present Illness: Brandon Ray is seen today for followup. He has a history of coronary disease and is status post CABG in 2004 after a NSTEMI. He had severe 3 vessel disease and normal LV function. 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 This has significantly limited his activity.  He really denies any significant cardiac complaints. He denies chest pain, shortness of breath, or palpitations. He has had some edema over the past year but this became worse over the past week. Since he started using compression hose this has improved. No orthopnea or PND. He did have SBO in January 2018 that resolved with conservative measures.  On follow up today he is feeling very well. Still limited by his neuropathy but walks regularly with a cane.  No chest pain, dyspnea or palpitations.   Current Outpatient Medications on File Prior to Visit  Medication Sig Dispense Refill  . aspirin EC 81 MG tablet Take 81 mg by mouth daily.    Marland Kitchen atorvastatin (LIPITOR) 20 MG tablet Take 1 tablet (20 mg total) by mouth daily at 6 PM. 90 tablet 3  . calcium-vitamin D (OSCAL WITH D) 500-200 MG-UNIT per tablet Take 1 tablet by mouth daily at 2 PM.     . Cholecalciferol (VITAMIN D-3) 5000 units TABS Take 5,000 Units by mouth every evening. Take 5000 IU 5 x week    . Cyanocobalamin (B-12) 2500 MCG TABS Take by mouth.    . Multiple Vitamins-Minerals (PRESERVISION AREDS 2 PO) Take 2 capsules by mouth at bedtime.     . Omega-3 Fatty Acids (FISH OIL) 1200 MG CAPS Take 1,200 mg by mouth at bedtime.     Marland Kitchen PHENobarbital (LUMINAL) 97.2 MG tablet Take 97.2 mg by mouth at bedtime.     . polyethylene glycol (MIRALAX / GLYCOLAX) 17 g packet Take 17 g by mouth daily. Pt uses as neded.    . pregabalin (LYRICA) 150 MG capsule Take 150 mg by mouth 2 (two) times daily.    . tamsulosin (FLOMAX) 0.4 MG CAPS capsule Take 0.4 mg by mouth at bedtime.     Marland Kitchen testosterone  (ANDROGEL) 50 MG/5GM (1%) GEL Apply 5 g on skin 5 out of 7 days 375 g 3   No current facility-administered medications on file prior to visit.    Allergies  Allergen Reactions  . Amoxicillin Anaphylaxis and Rash    Upper torso only. Has patient had a PCN reaction causing immediate rash, facial/tongue/throat swelling, SOB or lightheadedness with hypotension: No Has patient had a PCN reaction causing severe rash involving mucus membranes or skin necrosis: No Has patient had a PCN reaction that required hospitalization: No Has patient had a PCN reaction occurring within the last 10 years: Yes If all of the above answers are "NO", then may proceed with Cephalosporin use.   . Tetanus Toxoids Other (See Comments)    TDP vaccine- family stated this caused neuropathy.  . Boostrix [Tetanus-Diphth-Acell Pertussis] Other (See Comments)    Neuropathy symptoms  . Other Other (See Comments)    Flu shot-neuropathy symptoms    Past Medical History:  Diagnosis Date  . Arthritis   . BPH (benign prostatic hypertrophy) 2014  . Coronary artery disease 01/2003   CABG  . Free monoclonal light chain    urine  . History of blood transfusion 2004  . Hyperlipidemia    on medication  . Macular degeneration  both eyes  . Peripheral neuropathy 05/2011    follow up with Dr. Tish Frederickson multiple issues  . Pleural effusion 05/2011   from abdominal surgery  . Seizures ([place]) 1950's   last one 1961  . Small bowel obstruction ([place]) 05/2011   due to adhesion  . Weakness 2013-May began   lower  from neuropathy from knees down    Past Surgical History:  Procedure Laterality Date  . ABDOMINAL SURGERY  05/2011   bowel obstruction  . [place]   left  . CARDIAC CATHETERIZATION  01/27/2003   NORMAL LEFT VENTRICULAR SIZE WITH MILD FOCAL LATERAL WALL HYPOKINESIA. EF 60%  . CORONARY ARTERY BYPASS GRAFT  2004   LIMA GRAFT TO THE LAD, SAPHENOUS VEIN GRAFT TO THE DIAGONAL, SAPHENOUS VEIN GRAFT TO  THE FIRST OM, SAPHENOUS VEIN GRAFT TO THE PDA  . HAND SURGERY  2000   left  . TOTAL KNEE ARTHROPLASTY Right 01/03/2018   Procedure: RIGHT TOTAL KNEE ARTHROPLASTY;  Surgeon: Paralee Cancel, MD;  Location: [facility];  Service: Orthopedics;  Laterality: Right;  . TRANSURETHRAL RESECTION OF PROSTATE  1992  . TRANSURETHRAL RESECTION OF PROSTATE N/A 12/22/2012   Procedure: TRANSURETHRAL RESECTION OF THE PROSTATE WITH GYRUS (TURP);  Surgeon: Fredricka Bonine, MD;  Location: [facility];  Service: Urology;  Laterality: N/A;    Social History   Tobacco Use  Smoking Status Former Smoker  . Packs/day: 1.00  . Years: 5.00  . Pack years: 5.00  . Types: Cigarettes  . Quit date: 07/01/1965  . Years since quitting: 55.1  Smokeless Tobacco Never Used    Social History   Substance and Sexual Activity  Alcohol Use No    Family History  Problem Relation Age of Onset  . Asthma Mother   . Heart disease Mother 81  . Coronary artery disease Father   . Heart disease Father   . Cancer Sister 70       breast    Review of Systems: As noted in history of present illness.  All other systems were reviewed and are negative.  Physical Exam: BP (!) 90/50 (BP Location: Left Arm, Patient Position: Sitting)   Pulse 70   Ht $R'6\' 1"'mT$  (1.854 m)   Wt 181 lb 11.2 oz (82.4 kg)   SpO2 96%   BMI 23.97 kg/m  GENERAL:  Well appearing WM in NAD HEENT:  PERRL, EOMI, sclera are clear. Oropharynx is clear. NECK:  No jugular venous distention, carotid upstroke brisk and symmetric, no bruits, no thyromegaly or adenopathy LUNGS:  Clear to auscultation bilaterally CHEST:  Unremarkable HEART:  RRR,  PMI not displaced or sustained,S1 and S2 within normal limits, no S3, no S4: no clicks, no rubs, no murmurs ABD:  Soft, nontender. BS +, no masses or bruits. No hepatomegaly, no splenomegaly EXT:  2 + pulses throughout, he is wearing leg braces bilaterally, no  Edema, no cyanosis no clubbing SKIN:  Warm and  dry.  No rashes NEURO:  Alert and oriented x 3. Cranial nerves II through XII intact. PSYCH:  Cognitively intact    LABORATORY DATA: Lab Results  Component Value Date   WBC 3.7 (L) 10/17/2018   HGB 16.6 10/17/2018   HCT 48.9 10/17/2018   PLT 157.0 10/17/2018   GLUCOSE 99 01/05/2018   CHOL 144 08/15/2019   TRIG 68 08/15/2019   HDL 68 08/15/2019   LDLCALC 62 08/15/2019   ALT 49 11/15/2019   AST 34 11/15/2019  NA 130 (L) 01/05/2018   K 4.4 01/05/2018   CL 97 (L) 01/05/2018   CREATININE 0.65 01/05/2018   BUN 11 01/05/2018   CO2 26 01/05/2018   TSH 2.65 10/19/2017   PSA 0.10 10/19/2019   INR 1.05 06/07/2012   HGBA1C 6.1 09/28/2012   Labs from [facility] 10/15/15: Normal CBC and CMET.  Dated 03/31/17: normal CMET and CBC.  Dated 10/12/17: sodium 130 otherwise CMET normal. CBC normal. 10/11/18: normal CMET and CBC  Ecg today shows NSR, LAD,  RBBB. No change. I have personally reviewed and interpreted this study.   Assessment / Plan: 1. Coronary disease status post CABG in 2004. His last nuclear stress test in May of 2011 was normal. We will continue with his medical management and risk factor modification. He is asymptomatic.   2. Hyperlipidemia on statin. Will check lipid panel today.  3. PolyNeuropathy.  4. Leg edema. Resolved.   I will follow up in one year.

## 2020-08-20 ENCOUNTER — Ambulatory Visit: Payer: Medicare HMO | Admitting: Cardiology

## 2020-08-20 ENCOUNTER — Encounter: Payer: Self-pay | Admitting: Cardiology

## 2020-08-20 ENCOUNTER — Other Ambulatory Visit: Payer: Self-pay

## 2020-08-20 VITALS — BP 90/50 | HR 70 | Ht 73.0 in | Wt 181.7 lb

## 2020-08-20 DIAGNOSIS — I25810 Atherosclerosis of coronary artery bypass graft(s) without angina pectoris: Secondary | ICD-10-CM

## 2020-08-20 DIAGNOSIS — E7800 Pure hypercholesterolemia, unspecified: Secondary | ICD-10-CM | POA: Diagnosis not present

## 2020-08-20 LAB — BASIC METABOLIC PANEL
BUN/Creatinine Ratio: 22 (ref 10–24)
BUN: 21 mg/dL (ref 8–27)
CO2: 29 mmol/L (ref 20–29)
Calcium: 9.1 mg/dL (ref 8.6–10.2)
Chloride: 102 mmol/L (ref 96–106)
Creatinine, Ser: 0.96 mg/dL (ref 0.76–1.27)
GFR calc Af Amer: 84 mL/min/{1.73_m2} (ref >59)
GFR calc non Af Amer: 73 mL/min/{1.73_m2} (ref >59)
Glucose: 87 mg/dL (ref 65–99)
Potassium: 5 mmol/L (ref 3.5–5.2)
Sodium: 141 mmol/L (ref 134–144)

## 2020-08-20 LAB — HEPATIC FUNCTION PANEL
ALT: 117 IU/L — ABNORMAL HIGH (ref 0–44)
AST: 83 IU/L — ABNORMAL HIGH (ref 0–40)
Albumin: 4.4 g/dL (ref 3.6–4.6)
Alkaline Phosphatase: 98 IU/L (ref 44–121)
Bilirubin Total: 0.6 mg/dL (ref 0.0–1.2)
Bilirubin, Direct: 0.21 mg/dL (ref 0.00–0.40)
Total Protein: 6.4 g/dL (ref 6.0–8.5)

## 2020-08-20 LAB — LIPID PANEL
Chol/HDL Ratio: 2 ratio (ref 0.0–5.0)
Cholesterol, Total: 140 mg/dL (ref 100–199)
HDL: 71 mg/dL (ref >39)
LDL Chol Calc (NIH): 59 mg/dL (ref 0–99)
Triglycerides: 45 mg/dL (ref 0–149)
VLDL Cholesterol Cal: 10 mg/dL (ref 5–40)

## 2020-08-27 ENCOUNTER — Other Ambulatory Visit: Payer: Self-pay

## 2020-08-27 DIAGNOSIS — H353133 Nonexudative age-related macular degeneration, bilateral, advanced atrophic without subfoveal involvement: Secondary | ICD-10-CM

## 2020-08-27 DIAGNOSIS — R7401 Elevation of levels of liver transaminase levels: Secondary | ICD-10-CM

## 2020-08-27 NOTE — Progress Notes (Signed)
he

## 2020-09-04 NOTE — Progress Notes (Unsigned)
This encounter was created in error - please disregard.

## 2020-09-17 ENCOUNTER — Other Ambulatory Visit: Payer: Self-pay | Admitting: Cardiology

## 2020-09-29 DIAGNOSIS — R7401 Elevation of levels of liver transaminase levels: Secondary | ICD-10-CM | POA: Diagnosis not present

## 2020-09-30 ENCOUNTER — Other Ambulatory Visit: Payer: Self-pay

## 2020-09-30 DIAGNOSIS — E7849 Other hyperlipidemia: Secondary | ICD-10-CM

## 2020-09-30 DIAGNOSIS — I25810 Atherosclerosis of coronary artery bypass graft(s) without angina pectoris: Secondary | ICD-10-CM

## 2020-09-30 LAB — HEPATIC FUNCTION PANEL
ALT: 58 IU/L — ABNORMAL HIGH (ref 0–44)
AST: 36 IU/L (ref 0–40)
Albumin: 4.3 g/dL (ref 3.6–4.6)
Alkaline Phosphatase: 90 IU/L (ref 44–121)
Bilirubin Total: 0.6 mg/dL (ref 0.0–1.2)
Bilirubin, Direct: 0.19 mg/dL (ref 0.00–0.40)
Total Protein: 6.4 g/dL (ref 6.0–8.5)

## 2020-09-30 MED ORDER — ATORVASTATIN CALCIUM 10 MG PO TABS: ORAL_TABLET | ORAL | 6 refills | Status: DC

## 2020-10-02 DIAGNOSIS — R3914 Feeling of incomplete bladder emptying: Secondary | ICD-10-CM | POA: Diagnosis not present

## 2020-10-02 DIAGNOSIS — N401 Enlarged prostate with lower urinary tract symptoms: Secondary | ICD-10-CM | POA: Diagnosis not present

## 2020-10-14 DIAGNOSIS — Z23 Encounter for immunization: Secondary | ICD-10-CM | POA: Diagnosis not present

## 2020-10-14 DIAGNOSIS — D472 Monoclonal gammopathy: Secondary | ICD-10-CM | POA: Diagnosis not present

## 2020-10-17 ENCOUNTER — Encounter: Payer: Self-pay | Admitting: Internal Medicine

## 2020-10-17 ENCOUNTER — Ambulatory Visit (INDEPENDENT_AMBULATORY_CARE_PROVIDER_SITE_OTHER): Payer: Medicare HMO | Admitting: Internal Medicine

## 2020-10-17 ENCOUNTER — Other Ambulatory Visit: Payer: Self-pay

## 2020-10-17 VITALS — BP 110/60 | HR 71 | Ht 73.0 in | Wt 183.6 lb

## 2020-10-17 DIAGNOSIS — E291 Testicular hypofunction: Secondary | ICD-10-CM

## 2020-10-17 DIAGNOSIS — M818 Other osteoporosis without current pathological fracture: Secondary | ICD-10-CM | POA: Diagnosis not present

## 2020-10-17 DIAGNOSIS — E538 Deficiency of other specified B group vitamins: Secondary | ICD-10-CM | POA: Diagnosis not present

## 2020-10-17 LAB — VITAMIN B12: Vitamin B-12: 933 pg/mL — ABNORMAL HIGH (ref 211–911)

## 2020-10-17 MED ORDER — DENOSUMAB 60 MG/ML ~~LOC~~ SOSY
60.0000 mg | PREFILLED_SYRINGE | Freq: Once | SUBCUTANEOUS | Status: AC
  Administered 2020-10-17: 60 mg via SUBCUTANEOUS

## 2020-10-17 NOTE — Patient Instructions (Signed)
Please restart testosterone gel 5 g 5/7 days.  Continue the same B12 and D vitamin supplements.  Continue Prolia.  Please come back for a follow-up appointment in 1 year.

## 2020-10-17 NOTE — Progress Notes (Signed)
Prolia injection administered. Pt tolerated well

## 2020-10-17 NOTE — Progress Notes (Signed)
Subjective:     Patient ID: Brandon Ray, male   DOB: [birth year], 86 y.o.   MRN: [id number]  This visit occurred during the SARS-CoV-2 public health emergency.  Safety protocols were in place, including screening questions prior to the visit, additional usage of staff PPE, and extensive cleaning of exam room while observing appropriate contact time as indicated for disinfecting solutions.   HPI Brandon Ray is a very pleasant 86 y.o. man with h/o severe polyneuropathy and monoclonal gammopathy, returning for f/u for primary hypogonadism, osteoporosis + h/o sacral fracture, low B12. Last visit 1 year ago.  He had a hand squamous cell cancer removed since last OV.  He had transaminitis since last OV. He was taken off statins initially >> now restarted on a lower dose on Lipitor.  Reviewed and addended history: He continues to have severe neuropathy. He also still has discomfort in the mid thoracic region, pelvic region and his hands.  He was previously investigated for POEMS sd. - prev. Seen by Dr Gaylyn Rong, then had an evaluation by Dr. Ginnie Smart. Tuchman (Heme/Onc at [facility]) for this, but it was concluded that he actually has MGUS and will be just followed for it. He also had a fat pad biopsy to rule out amyloidosis and this was negative.   Please see my previous notes regarding patient's past medical history. He does have polyneuropathy, monoclonal gammopathy, gynecomastia and primary hypogonadism, but no organomegaly or significant skin changes. He also sees Dr. Hermelinda Medicus in pain clinic,  Dr Burt Ek (neurology) at the [facility]. Dr. Azzie Almas is his local neurologist ([facility]). 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   She was on Neurontin high doses but this was not helping >> sees Pain Clinic at [facility] >> tried Cymbalta + Neurontin >> did not help >> now on Lyrica, which helps some.  Primary hypogonadism: -We initially started him on testosterone gel 5 g daily, then we  increased to 7.5 g daily.  He could not tolerate this dose due to increased libido, pelvic tension, breast tension.  We then decrease the dose to 5 g 5 out of 7 days, however, he did stop this before last visit,in 06/2019  Due to weakness, he was interested in restarting it at last visit and this also was approved by his insurance.  At this visit, he tells me he restarted the testosterone after our last visit but then stopped in 06/2020 around the time of his skin surgery.  Reviewed pertinent labs: Component     Latest Ref Rng & Units 10/19/2019  Testosterone, Serum (Total)     ng/dL 24 (L)  % Free Testosterone     % 0.6  Free Testosterone, S     pg/mL 1.4 (L)  Sex Hormone Binding Globulin     nmol/L 100.7 (H)  PSA     0.10 - 4.00 ng/mL 0.10   Component     Latest Ref Rng & Units 10/17/2018  Testosterone, Serum (Total)     ng/dL 782  % Free Testosterone     % 0.8  Free Testosterone, S     pg/mL 38 (L)  Sex Hormone Binding Globulin     nmol/L 79.6 (H)   Component     Latest Ref Rng & Units 10/19/2017  Testosterone, Serum (Total)     ng/dL 870  % Free Testosterone     % 1.1  Free Testosterone, S     pg/mL 74  Sex Hormone Binding Globulin  nmol/L 80.9 (H)   Component     Latest Ref Rng & Units 10/20/2015 10/19/2016  Testosterone     264 - 916 ng/dL 251 862  Sex Horm Binding Glob, Serum     19.3 - 76.4 nmol/L 55 71.5  Testosterone Free     6.6 - 18.1 pg/mL 34.6 (L) 10.3  Testosterone-% Free     1.6 - 2.9 % 1.4 (L)    Reviewed PSA levels: Lab Results  Component Value Date   PSA 0.10 10/19/2019   PSA 0.47 10/19/2017   PSA 0.20 01/31/2017   PSA 5.89 (H) 10/19/2016   PSA 0.30 10/20/2015   PSA 0.35 04/16/2015   PSA 0.48 03/13/2014   PSA 0.15 02/09/2013   Reviewed his recent hemoglobin/hematocrit: 10/14/2020: 14.7/45 Lab Results  Component Value Date   WBC 3.7 (L) 10/17/2018   HGB 16.6 10/17/2018   HCT 48.9 10/17/2018   MCV 96.1 10/17/2018   PLT 157.0  10/17/2018  10/12/2017: 16/47.80 10/15/2015: Hb 15.8/HT 47.8 04/15/2015: Hb 16.4 (13.7-17.3)/HT 50.2 (39-49)  He sees his urologist on a regular basis and has DRE's every year. Last visit earlier this mo >> DRE normal reportedly.  She also has a history of gynecomastia, with normal mammograms.  This is likely secondary to his primary hypogonadism.  It has not improved after starting testosterone.  Prolactin level was normal.He also has dysesthesia at the level of his nipples, probably related to his neuropathy.  This improved on Lyrica.  She has a history of being on high-dose steroids for his neuropathy-initially started at 60 mg in 07/1999>> stopped prednisone 09/2013.  Patient with clinical osteoporosis: Osteopenic BMD, however, with a sacral fracture likely secondary to steroid treatment, age, and hypogonadism.  No fractures since last visit.  Review latest bone density ([facility]) reports-T-scores improved:  DXA 07/02/2019: L1-L4 T score of +1.1 (+6.7%*) and right femoral neck -1.1, left femoral neck -0.8 (mean femoral neck: +1.9%)  DXA 11/28/2016: L1-L4 T score of +0.4 (+3.3%*) and right femoral neck -1.3, left femoral neck -0.8 (mean femoral neck: +3.0%)  DXA 11/10/2014: L1-L4 T score of +0.1 and right femoral neck -1.4, left femoral neck -1.2 DXA 11/06/2012: L1-L4 T score of -0.9 and dual femur T score of -1.1  We started Prolia and he had a number of 10 injections including 09/18/2019, before last visit.   Since then, he had another Prolia injection (the 11th) on 03/25/2020.  He is due for another injection today.  Latest vitamin D levels were normal: Lab Results  Component Value Date   VD25OH 67.06 10/19/2019   VD25OH 91.84 10/17/2018   VD25OH 62.54 10/19/2017   VD25OH 58.28 10/19/2016   VD25OH 54.39 10/20/2015   VD25OH 64.27 04/16/2015   VD25OH 56.34 10/04/2014   VD25OH 67 09/14/2013   VD25OH 55 09/28/2012  He continues on 5000 units vitamin D 5/7 days +400 units vitamin D-600  mg calcium.  He has a history of low B12: Lab Results  Component Value Date   VITAMINB12 >1500 (H) 10/19/2019   VITAMINB12 1,251 (H) 10/17/2018   VITAMINB12 1,309 (H) 10/19/2017   VITAMINB12 >1500 (H) 01/31/2017   VITAMINB12 225 10/19/2016  At last visit, he was on B12 p.o. 5000 mcg every other day, but since the B12 level was still high, I advised him to only take this twice a week.  Review latest CBC from [facility] from 10/11/2019 and this was normal.  He continues on phenobarbital since 1961.   Review of Systems + see HPI  Constitutional: no weight gain/no weight loss, + fatigue, no subjective hyperthermia, no subjective hypothermia Eyes: no blurry vision, no xerophthalmia ENT: no sore throat, no nodules palpated in neck, no dysphagia, no odynophagia, no hoarseness Cardiovascular: no CP/no SOB/no palpitations/no leg swelling Respiratory: no cough/no SOB/no wheezing Gastrointestinal: no N/no V/no D/no C/no acid reflux Musculoskeletal: + muscle aches/no joint aches Skin: no rashes, no hair loss Neurological: no tremors/+ numbness/+ tingling/no dizziness  I reviewed pt's medications, allergies, PMH, social hx, family hx, and changes were documented in the history of present illness. Otherwise, unchanged from my initial visit note.  Past Medical History:  Diagnosis Date  . Arthritis   . BPH (benign prostatic hypertrophy) 2014  . Coronary artery disease 01/2003   CABG  . Free monoclonal light chain    urine  . History of blood transfusion 2004  . Hyperlipidemia    on medication  . Macular degeneration    both eyes  . Peripheral neuropathy 05/2011    follow up with Dr. Tish Frederickson multiple issues  . Pleural effusion 05/2011   from abdominal surgery  . Seizures ([place]) 1950's   last one 1961  . Small bowel obstruction ([place]) 05/2011   due to adhesion  . Weakness 2013-May began   lower  from neuropathy from knees down   Past Surgical History:  Procedure Laterality Date  . ABDOMINAL  SURGERY  05/2011   bowel obstruction  . [place]   left  . CARDIAC CATHETERIZATION  01/27/2003   NORMAL LEFT VENTRICULAR SIZE WITH MILD FOCAL LATERAL WALL HYPOKINESIA. EF 60%  . CORONARY ARTERY BYPASS GRAFT  2004   LIMA GRAFT TO THE LAD, SAPHENOUS VEIN GRAFT TO THE DIAGONAL, SAPHENOUS VEIN GRAFT TO THE FIRST OM, SAPHENOUS VEIN GRAFT TO THE PDA  . HAND SURGERY  2000   left  . TOTAL KNEE ARTHROPLASTY Right 01/03/2018   Procedure: RIGHT TOTAL KNEE ARTHROPLASTY;  Surgeon: Paralee Cancel, MD;  Location: [facility];  Service: Orthopedics;  Laterality: Right;  . TRANSURETHRAL RESECTION OF PROSTATE  1992  . TRANSURETHRAL RESECTION OF PROSTATE N/A 12/22/2012   Procedure: TRANSURETHRAL RESECTION OF THE PROSTATE WITH GYRUS (TURP);  Surgeon: Fredricka Bonine, MD;  Location: [facility];  Service: Urology;  Laterality: N/A;   Social History   Socioeconomic History  . Marital status: Married    Spouse name: Not on file  . Number of children: 4  . Years of education: Not on file  . Highest education level: Not on file  Occupational History  . Occupation: Freight forwarder    Comment: retired, Personnel officer.   Tobacco Use  . Smoking status: Former Smoker    Packs/day: 1.00    Years: 5.00    Pack years: 5.00    Types: Cigarettes    Quit date: 07/01/1965    Years since quitting: 55.3  . Smokeless tobacco: Never Used  Vaping Use  . Vaping Use: Never used  Substance and Sexual Activity  . Alcohol use: No  . Drug use: No  . Sexual activity: Not on file  Other Topics Concern  . Not on file  Social History Narrative  . Not on file   Social Determinants of Health   Financial Resource Strain: Not on file  Food Insecurity: Not on file  Transportation Needs: Not on file  Physical Activity: Not on file  Stress: Not on file  Social Connections: Not on file  Intimate Partner Violence: Not on file   Current  Outpatient Medications on File Prior to Visit   Medication Sig Dispense Refill  . aspirin EC 81 MG tablet Take 81 mg by mouth daily.    Marland Kitchen atorvastatin (LIPITOR) 10 MG tablet Take 1/2 tablet 5 mg daily. 30 tablet 6  . calcium-vitamin D (OSCAL WITH D) 500-200 MG-UNIT per tablet Take 1 tablet by mouth daily at 2 PM.     . Cholecalciferol (VITAMIN D-3) 5000 units TABS Take 5,000 Units by mouth every evening. Take 5000 IU 5 x week    . Cyanocobalamin (B-12) 2500 MCG TABS Take by mouth.    . Multiple Vitamins-Minerals (PRESERVISION AREDS 2 PO) Take 2 capsules by mouth at bedtime.     . Omega-3 Fatty Acids (FISH OIL) 1200 MG CAPS Take 1,200 mg by mouth at bedtime.     Marland Kitchen PHENobarbital (LUMINAL) 97.2 MG tablet Take 97.2 mg by mouth at bedtime.     . polyethylene glycol (MIRALAX / GLYCOLAX) 17 g packet Take 17 g by mouth daily. Pt uses as neded.    . pregabalin (LYRICA) 150 MG capsule Take 150 mg by mouth 2 (two) times daily.    . tamsulosin (FLOMAX) 0.4 MG CAPS capsule Take 0.4 mg by mouth at bedtime.     Marland Kitchen testosterone (ANDROGEL) 50 MG/5GM (1%) GEL Apply 5 g on skin 5 out of 7 days 375 g 3   No current facility-administered medications on file prior to visit.   Allergies  Allergen Reactions  . Amoxicillin Anaphylaxis and Rash    Upper torso only. Has patient had a PCN reaction causing immediate rash, facial/tongue/throat swelling, SOB or lightheadedness with hypotension: No Has patient had a PCN reaction causing severe rash involving mucus membranes or skin necrosis: No Has patient had a PCN reaction that required hospitalization: No Has patient had a PCN reaction occurring within the last 10 years: Yes If all of the above answers are "NO", then may proceed with Cephalosporin use.   . Tetanus Toxoids Other (See Comments)    TDP vaccine- family stated this caused neuropathy.  . Boostrix [Tetanus-Diphth-Acell Pertussis] Other (See Comments)    Neuropathy symptoms  . Other Other (See Comments)    Flu shot-neuropathy symptoms   Family  History  Problem Relation Age of Onset  . Asthma Mother   . Heart disease Mother 79  . Coronary artery disease Father   . Heart disease Father   . Cancer Sister 5       breast    Objective:   Physical Exam BP 110/60   Pulse 71   Ht $R'6\' 1"'Vt$  (1.854 m)   Wt 183 lb 9.6 oz (83.3 kg)   SpO2 96%   BMI 24.22 kg/m  Body mass index is 24.22 kg/m. Wt Readings from Last 3 Encounters:  10/17/20 183 lb 9.6 oz (83.3 kg)  08/20/20 181 lb 11.2 oz (82.4 kg)  10/19/19 176 lb (79.8 kg)   Constitutional: overweight, in NAD, walks with a cane Eyes: PERRLA, EOMI, no exophthalmos ENT: moist mucous membranes, no thyromegaly, no cervical lymphadenopathy Cardiovascular: RRR, No MRG, + bilateral pitting edema Respiratory: CTA B Gastrointestinal: abdomen soft, NT, ND, BS+ Musculoskeletal: no deformities, strength intact in all 4 Skin: moist, warm, no rashes Neurological: no tremor with outstretched hands  Assessment:     1. Primary hypogonadism - + gynecomastia bilaterally (per mammogram) + testicular atrophy - He is on testosterone gel >> a sensation of pressure in abdomen, but this did not subside after stopping testosterone gel for  1 mo after his surgery for Transurethral resection of prostate residual growth in 12/22/2012.   2. Osteoporosis - h/o sacral fracture, right big toe fracture (04/2013), spine X-ray: no vb fx - Off steroids now - on adequate Ca+vit D - On Prolia, no side effects  3.  Low vitamin B12  4. Polyneuropathy - unlikely POEMS syndrome per evaluation by Hem/Onc at [facility] - amyloidosis ruled out by negative fat pad biopsy - managed by Dr. Trinna Post at [facility] and Dr. Doree Albee at [facility]   5. Long-term use of steroids - previously on prednisone 60 mg starting at the end of 2013 - Was able to taper >> now off since 09/2013  6.  MGUS - monoclonal gammopathy - now managed by Dr.Tuchman at [facility] previously Dr. Lamonte Sakai     Plan:     1. Primary hypogonadism -Patient  was on AndroGel 1% 5 g 5/7 days.  He was missing doses on partners when his grandchildren were visiting, to avoid cancer.  However, he never felt a significant benefit from the testosterone and even had pelvic pressure (but it was not clear whether this was related to testosterone treatment since he held testosterone in the past and was still experiencing the symptoms).  He continues to see Dr. Junious Silk with urology and he is up-to-date with his DRE's.  At last visit, his testosterone level was slightly low, but he expressed his wish to maybe come off testosterone if possible after the latest bone density returned.   -At last visit, he told me that he was interested in restarting testosterone due to weakness that he developed in the previous 3 months.  Since a testosterone level was still low, I advised him to restart 5 g of testosterone 5 out of 7 days.  He did so, but then stopped it again 06/2019, around the time of his skin surgery.  He still has testosterone gel at home and would like to restart. -In this case, we will not recheck his testosterone and PSA today, but will do so at next visit.  CBC was checked at [facility] and it was recently normal.  2. Osteoporosis  -He has a history of osteopenia on DXA scans but he does have a history of sacral fracture, giving him a diagnosis of osteoporosis. -He has been on prednisone and Dilantin in the past (now off) and also on testosterone replacement.   -No recent fractures.  He had 12 Prolia injections so far. -Latest bone mineral density report was reviewed from 07/02/2019 and showed stable BMD at the hips and improvement at the spine.  He will be due for another bone density scan towards the end of this year. -He has noticed side effects from Prolia.  No jaw, thigh, hip pain -Our initial plan was to continue Prolia for at least 6 years (had 11 injections).  He is now in the 6-year.  Newer studies, however, show continuous improvement even beyond 10 years.  Plan  to continue Prolia for now. -He continues on 5000 units vitamin D 5 out of 7 days.  At last visit, vitamin D level was normal on this dose -We will recheck the vitamin D level today. -Continues on calcium 600 mg daily -I will see him back in 1 year  3.  Low vitamin B12 -We checked his B12 in the past during investigation for possible causes for neuropathy.  This returned low so we started B12 supplementation, initially on 5000 mcg daily, and then we gradually reduced the  dose -At last visit, B12 was still high on 5000 mcg every other day so we decreased the dose to 5000 mcg twice a week. -We will recheck his B12 today  Orders Placed This Encounter  Procedures  . Vitamin B12  . Vitamin D, 25-hydroxy   CC: Drs. Gable, [place]  Component     Latest Ref Rng & Units 10/17/2020  Vitamin B12     211 - 911 pg/mL 933 (H)  Vitamin D, 25-Hydroxy     30.0 - 100.0 ng/mL 28.6 (L)  B12 now only slightly high.  I would suggest to continue the current dose of vitamin B12 supplement.  However, vitamin D is now slightly low so I would advise him to take the 5000 units of vitamin D daily.  Philemon Kingdom, MD PhD [facility] Endocrinology

## 2020-10-18 LAB — VITAMIN D 25 HYDROXY (VIT D DEFICIENCY, FRACTURES): Vit D, 25-Hydroxy: 28.6 ng/mL — ABNORMAL LOW (ref 30.0–100.0)

## 2020-10-20 ENCOUNTER — Encounter: Payer: Self-pay | Admitting: Internal Medicine

## 2020-10-21 ENCOUNTER — Other Ambulatory Visit: Payer: Self-pay | Admitting: Internal Medicine

## 2020-10-21 DIAGNOSIS — M818 Other osteoporosis without current pathological fracture: Secondary | ICD-10-CM

## 2020-11-07 ENCOUNTER — Telehealth: Payer: Self-pay

## 2020-11-07 NOTE — Telephone Encounter (Signed)
Prolia VOB  $25 admin fee, 20% coinsurance Prior auth # [id number] Valid 03/13/20-03/13/21  COVERAGE DETAILS: The benefits provided on this Verification of Benefits form are Medical Benefits and are the patient's In-Network benefits for Prolia.   Patient cost will be roughly $275.00

## 2020-11-07 NOTE — Telephone Encounter (Signed)
Called pt and left VM to call the office.  

## 2020-11-14 NOTE — Telephone Encounter (Signed)
Prolia inj given at [place] 10/17/20.

## 2020-11-19 DIAGNOSIS — H43813 Vitreous degeneration, bilateral: Secondary | ICD-10-CM | POA: Diagnosis not present

## 2020-11-19 DIAGNOSIS — H524 Presbyopia: Secondary | ICD-10-CM | POA: Diagnosis not present

## 2020-11-19 DIAGNOSIS — H353132 Nonexudative age-related macular degeneration, bilateral, intermediate dry stage: Secondary | ICD-10-CM | POA: Diagnosis not present

## 2020-11-19 DIAGNOSIS — H26493 Other secondary cataract, bilateral: Secondary | ICD-10-CM | POA: Diagnosis not present

## 2020-12-24 DIAGNOSIS — M25562 Pain in left knee: Secondary | ICD-10-CM | POA: Diagnosis not present

## 2020-12-29 ENCOUNTER — Encounter: Payer: Self-pay | Admitting: Cardiology

## 2020-12-29 NOTE — Telephone Encounter (Signed)
error 

## 2020-12-31 DIAGNOSIS — I25810 Atherosclerosis of coronary artery bypass graft(s) without angina pectoris: Secondary | ICD-10-CM | POA: Diagnosis not present

## 2020-12-31 DIAGNOSIS — E7849 Other hyperlipidemia: Secondary | ICD-10-CM | POA: Diagnosis not present

## 2020-12-31 LAB — HEPATIC FUNCTION PANEL
ALT: 31 IU/L (ref 0–44)
AST: 23 IU/L (ref 0–40)
Albumin: 4.2 g/dL (ref 3.6–4.6)
Alkaline Phosphatase: 96 IU/L (ref 44–121)
Bilirubin Total: 0.5 mg/dL (ref 0.0–1.2)
Bilirubin, Direct: 0.15 mg/dL (ref 0.00–0.40)
Total Protein: 6.3 g/dL (ref 6.0–8.5)

## 2020-12-31 LAB — LIPID PANEL
Chol/HDL Ratio: 2.7 ratio (ref 0.0–5.0)
Cholesterol, Total: 159 mg/dL (ref 100–199)
HDL: 58 mg/dL (ref >39)
LDL Chol Calc (NIH): 85 mg/dL (ref 0–99)
Triglycerides: 83 mg/dL (ref 0–149)
VLDL Cholesterol Cal: 16 mg/dL (ref 5–40)

## 2021-01-05 ENCOUNTER — Telehealth: Payer: Self-pay

## 2021-01-05 ENCOUNTER — Encounter (INDEPENDENT_AMBULATORY_CARE_PROVIDER_SITE_OTHER): Payer: Medicare HMO | Admitting: Ophthalmology

## 2021-01-05 NOTE — Telephone Encounter (Addendum)
Left voice message for patient to give office a call back for recent results, medication changes and repeat labs.   ----- Message from Peter M Martinique, MD sent at 12/31/2020  4:55 PM EDT ----- All values are normal or within acceptable limits.   Medication changes / Follow up labs / Other changes or recommendations:   LDL has increased some off lipitor but transaminases have returned to normal. I would recommend resuming low dose lipitor 5 mg daily. Repeat 3 months  Peter Martinique, MD 12/31/2020 4:54 PM

## 2021-01-06 ENCOUNTER — Other Ambulatory Visit: Payer: Self-pay

## 2021-01-06 DIAGNOSIS — I25810 Atherosclerosis of coronary artery bypass graft(s) without angina pectoris: Secondary | ICD-10-CM

## 2021-01-06 DIAGNOSIS — E7849 Other hyperlipidemia: Secondary | ICD-10-CM

## 2021-01-06 MED ORDER — ATORVASTATIN CALCIUM 10 MG PO TABS: ORAL_TABLET | ORAL | 3 refills | Status: DC

## 2021-01-07 ENCOUNTER — Emergency Department (HOSPITAL_COMMUNITY): Payer: Medicare HMO

## 2021-01-07 ENCOUNTER — Encounter (HOSPITAL_COMMUNITY): Payer: Self-pay | Admitting: Radiology

## 2021-01-07 ENCOUNTER — Emergency Department (HOSPITAL_COMMUNITY)
Admission: EM | Admit: 2021-01-07 | Discharge: 2021-01-08 | Disposition: A | Discharge status: Home / Self Care | Payer: Medicare HMO | Attending: Emergency Medicine | Admitting: Emergency Medicine

## 2021-01-07 ENCOUNTER — Other Ambulatory Visit: Payer: Self-pay

## 2021-01-07 DIAGNOSIS — E876 Hypokalemia: Secondary | ICD-10-CM | POA: Insufficient documentation

## 2021-01-07 DIAGNOSIS — Z87891 Personal history of nicotine dependence: Secondary | ICD-10-CM | POA: Diagnosis not present

## 2021-01-07 DIAGNOSIS — Z951 Presence of aortocoronary bypass graft: Secondary | ICD-10-CM | POA: Insufficient documentation

## 2021-01-07 DIAGNOSIS — K529 Noninfective gastroenteritis and colitis, unspecified: Secondary | ICD-10-CM | POA: Diagnosis not present

## 2021-01-07 DIAGNOSIS — Z7982 Long term (current) use of aspirin: Secondary | ICD-10-CM | POA: Insufficient documentation

## 2021-01-07 DIAGNOSIS — K567 Ileus, unspecified: Secondary | ICD-10-CM | POA: Diagnosis not present

## 2021-01-07 DIAGNOSIS — R109 Unspecified abdominal pain: Secondary | ICD-10-CM | POA: Diagnosis not present

## 2021-01-07 DIAGNOSIS — M47816 Spondylosis without myelopathy or radiculopathy, lumbar region: Secondary | ICD-10-CM | POA: Diagnosis not present

## 2021-01-07 DIAGNOSIS — R1084 Generalized abdominal pain: Secondary | ICD-10-CM

## 2021-01-07 DIAGNOSIS — N3289 Other specified disorders of bladder: Secondary | ICD-10-CM | POA: Diagnosis not present

## 2021-01-07 DIAGNOSIS — Z96641 Presence of right artificial hip joint: Secondary | ICD-10-CM | POA: Diagnosis not present

## 2021-01-07 DIAGNOSIS — I2510 Atherosclerotic heart disease of native coronary artery without angina pectoris: Secondary | ICD-10-CM | POA: Diagnosis not present

## 2021-01-07 DIAGNOSIS — R34 Anuria and oliguria: Secondary | ICD-10-CM | POA: Diagnosis not present

## 2021-01-07 DIAGNOSIS — R197 Diarrhea, unspecified: Secondary | ICD-10-CM | POA: Diagnosis not present

## 2021-01-07 DIAGNOSIS — R112 Nausea with vomiting, unspecified: Secondary | ICD-10-CM | POA: Diagnosis not present

## 2021-01-07 LAB — CBC WITH DIFFERENTIAL/PLATELET
Abs Immature Granulocytes: 0.01 10*3/uL (ref 0.00–0.07)
Basophils Absolute: 0 10*3/uL (ref 0.0–0.1)
Basophils Relative: 0 %
Eosinophils Absolute: 0 10*3/uL (ref 0.0–0.5)
Eosinophils Relative: 0 %
HCT: 46.2 % (ref 39.0–52.0)
Hemoglobin: 15.3 g/dL (ref 13.0–17.0)
Immature Granulocytes: 0 %
Lymphocytes Relative: 17 %
Lymphs Abs: 0.7 10*3/uL (ref 0.7–4.0)
MCH: 32.1 pg (ref 26.0–34.0)
MCHC: 33.1 g/dL (ref 30.0–36.0)
MCV: 96.9 fL (ref 80.0–100.0)
Monocytes Absolute: 0.4 10*3/uL (ref 0.1–1.0)
Monocytes Relative: 11 %
Neutro Abs: 3 10*3/uL (ref 1.7–7.7)
Neutrophils Relative %: 72 %
Platelets: 193 10*3/uL (ref 150–400)
RBC: 4.77 MIL/uL (ref 4.22–5.81)
RDW: 13.2 % (ref 11.5–15.5)
WBC: 4.2 10*3/uL (ref 4.0–10.5)
nRBC: 0 % (ref 0.0–0.2)

## 2021-01-07 LAB — COMPREHENSIVE METABOLIC PANEL
ALT: 38 U/L (ref 0–44)
AST: 39 U/L (ref 15–41)
Albumin: 3.7 g/dL (ref 3.5–5.0)
Alkaline Phosphatase: 83 U/L (ref 38–126)
Anion gap: 7 (ref 5–15)
BUN: 19 mg/dL (ref 8–23)
CO2: 25 mmol/L (ref 22–32)
Calcium: 7.3 mg/dL — ABNORMAL LOW (ref 8.9–10.3)
Chloride: 102 mmol/L (ref 98–111)
Creatinine, Ser: 0.87 mg/dL (ref 0.61–1.24)
GFR, Estimated: >60 mL/min (ref >60)
Glucose, Bld: 110 mg/dL — ABNORMAL HIGH (ref 70–99)
Potassium: 3.2 mmol/L — ABNORMAL LOW (ref 3.5–5.1)
Sodium: 134 mmol/L — ABNORMAL LOW (ref 135–145)
Total Bilirubin: 0.5 mg/dL (ref 0.3–1.2)
Total Protein: 6.4 g/dL — ABNORMAL LOW (ref 6.5–8.1)

## 2021-01-07 LAB — LACTIC ACID, PLASMA
Lactic Acid, Venous: 0.9 mmol/L (ref 0.5–1.9)
Lactic Acid, Venous: 0.9 mmol/L (ref 0.5–1.9)

## 2021-01-07 LAB — LIPASE, BLOOD: Lipase: 33 U/L (ref 11–51)

## 2021-01-07 MED ORDER — IOHEXOL 300 MG/ML  SOLN
100.0000 mL | Freq: Once | INTRAMUSCULAR | Status: AC | PRN
  Administered 2021-01-07: 100 mL via INTRAVENOUS

## 2021-01-07 NOTE — ED Triage Notes (Signed)
Lower abdominal pain and constipation x 4days - pt feels like its obstruction and hasnt been passing gas as much.   Abdominal distention noted.

## 2021-01-07 NOTE — ED Provider Notes (Signed)
[facility] Provider Note   CSN: [id number] Arrival date & time: 01/07/21  2050     History Chief Complaint  Patient presents with  . Abdominal Pain  . Constipation    Brandon Ray is a 86 y.o. male.  Patient with history of seizures, CAD, SBO, HLD, presents with abdominal pain x 4 days, constant since onset, associated with nausea, vomiting. No fever. He reports passing very little gas, but no bowel movements since pain began. History of SBO 2011 requiring surgical intervention that he reports feels similar. No dysuria, chest pain, SOB, cough.   The history is provided by the patient. No language interpreter was used.  Abdominal Pain Associated symptoms: constipation, nausea and vomiting   Associated symptoms: no chest pain, no chills, no dysuria, no fever and no shortness of breath   Constipation Associated symptoms: abdominal pain, nausea and vomiting   Associated symptoms: no dysuria and no fever        Past Medical History:  Diagnosis Date  . Arthritis   . BPH (benign prostatic hypertrophy) 2014  . Coronary artery disease 01/2003   CABG  . Free monoclonal light chain    urine  . History of blood transfusion 2004  . Hyperlipidemia    on medication  . Macular degeneration    both eyes  . Peripheral neuropathy 05/2011    follow up with Dr. Tish Frederickson multiple issues  . Pleural effusion 05/2011   from abdominal surgery  . Seizures ([place]) 1950's   last one 1961  . Small bowel obstruction ([place]) 05/2011   due to adhesion  . Weakness 2013-May began   lower  from neuropathy from knees down    Patient Active Problem List   Diagnosis Date Noted  . Posterior vitreous detachment of both eyes 04/03/2020  . Advanced nonexudative age-related macular degeneration of both eyes without subfoveal involvement 04/03/2020  . Exudative age-related macular degeneration of right eye with inactive choroidal neovascularization ([place]) 04/03/2020   . Diplopia 04/03/2020  . Vitreous floaters of both eyes 04/03/2020  . Vitamin D deficiency 10/17/2018  . Overweight (BMI 25.0-29.9) 01/04/2018  . S/P right TKA 01/03/2018  . S/P total knee replacement 01/03/2018  . Low serum vitamin B12 10/19/2017  . SBO (small bowel obstruction) ([place]) 10/06/2016  . Diarrhea 10/06/2016  . AKI (acute kidney injury) ([place]) 10/06/2016  . Osteoporosis 03/13/2014  . Encounter for long-term (current) use of steroids 03/16/2013  . Primary male hypogonadism 10/19/2012  . Endocrinopathy 09/20/2012  . Gynecomastia, male 09/20/2012  . Fracture 09/20/2012  . Free monoclonal light chain   . Monoclonal (M) protein disease, multiple 'M' protein   . CIDP (chronic inflammatory demyelinating polyneuropathy) ([place]) 06/21/2012  . Constipation 06/20/2012  . Chronic inflammatory demyelinating polyneuropathy ([place]) 06/12/2012  . Acute urinary retention 06/12/2012  . Neurogenic bladder 06/12/2012  . Right sided weakness 06/07/2012  . Seizure disorder ([place]) 06/07/2012  . Pleural effusion on right 07/22/2011  . Tachycardia 07/22/2011  . Small bowel obstruction, s/p lap LOA, c/b post op abscess 07/02/2011  . Coronary artery disease   . Hyperlipidemia     Past Surgical History:  Procedure Laterality Date  . ABDOMINAL SURGERY  05/2011   bowel obstruction  . [place]   left  . CARDIAC CATHETERIZATION  01/27/2003   NORMAL LEFT VENTRICULAR SIZE WITH MILD FOCAL LATERAL WALL HYPOKINESIA. EF 60%  . CORONARY ARTERY BYPASS GRAFT  2004   LIMA GRAFT TO THE LAD,  SAPHENOUS VEIN GRAFT TO THE DIAGONAL, SAPHENOUS VEIN GRAFT TO THE FIRST OM, SAPHENOUS VEIN GRAFT TO THE PDA  . HAND SURGERY  2000   left  . TOTAL KNEE ARTHROPLASTY Right 01/03/2018   Procedure: RIGHT TOTAL KNEE ARTHROPLASTY;  Surgeon: Paralee Cancel, MD;  Location: [facility];  Service: Orthopedics;  Laterality: Right;  . TRANSURETHRAL RESECTION OF PROSTATE  1992  . TRANSURETHRAL RESECTION OF PROSTATE N/A  12/22/2012   Procedure: TRANSURETHRAL RESECTION OF THE PROSTATE WITH GYRUS (TURP);  Surgeon: Fredricka Bonine, MD;  [facility];  Service: Urology;  Laterality: N/A;       Family History  Problem Relation Age of Onset  . Asthma Mother   . Heart disease Mother 5  . Coronary artery disease Father   . Heart disease Father   . Cancer Sister 42       breast    Social History   Tobacco Use  . Smoking status: Former Smoker    Packs/day: 1.00    Years: 5.00    Pack years: 5.00    Types: Cigarettes    Quit date: 07/01/1965    Years since quitting: 55.5  . Smokeless tobacco: Never Used  Vaping Use  . Vaping Use: Never used  Substance Use Topics  . Alcohol use: No  . Drug use: No    Home Medications Prior to Admission medications   Medication Sig Start Date End Date Taking? Authorizing Provider  aspirin EC 81 MG tablet Take 81 mg by mouth daily.    [provider]  atorvastatin (LIPITOR) 10 MG tablet Take 1/2 tablet ( 5 mg ) daily 01/06/21   [place], Peter M, MD  calcium-vitamin D (OSCAL WITH D) 500-200 MG-UNIT per tablet Take 1 tablet by mouth daily at 2 PM.    [provider]  Cholecalciferol (VITAMIN D-3) 5000 units TABS Take 5,000 Units by mouth every evening. Take 5000 IU 5 x week    [provider]  Cyanocobalamin (B-12) 2500 MCG TABS Take by mouth.    [provider]  Multiple Vitamins-Minerals (PRESERVISION AREDS 2 PO) Take 2 capsules by mouth at bedtime.     [provider]  Omega-3 Fatty Acids (FISH OIL) 1200 MG CAPS Take 1,200 mg by mouth at bedtime.     [provider]  PHENobarbital (LUMINAL) 97.2 MG tablet Take 97.2 mg by mouth at bedtime.    [provider]  polyethylene glycol (MIRALAX / GLYCOLAX) 17 g packet Take 17 g by mouth daily. Pt uses as neded.    [provider]  pregabalin (LYRICA) 150 MG capsule Take 150 mg by mouth 2 (two) times daily.    [provider]  tamsulosin (FLOMAX) 0.4 MG CAPS capsule Take 0.4 mg by mouth at bedtime.  09/21/14   [provider]  testosterone (ANDROGEL) 50 MG/5GM (1%) GEL Apply 5 g on skin 5 out of 7 days 12/07/19   Philemon Kingdom, MD    Allergies    Amoxicillin, Tetanus toxoids, Boostrix [tetanus-diphth-acell pertussis], and Other  Review of Systems   Review of Systems  Constitutional: Negative for chills and fever.  HENT: Negative.   Respiratory: Negative.  Negative for shortness of breath.   Cardiovascular: Negative.  Negative for chest pain.  Gastrointestinal: Positive for abdominal distention, abdominal pain, constipation, nausea and vomiting.  Genitourinary: Negative for dysuria.  Musculoskeletal: Negative.   Skin: Negative.   Neurological: Negative.  Negative for weakness.  Psychiatric/Behavioral: Negative for confusion.  Physical Exam Updated Vital Signs BP 122/75 (BP Location: Left Arm)   Pulse 78   Temp 97.9 F (36.6 C) (Oral)   Resp 15   Ht $R'6\' 1"'TG$  (1.854 m)   Wt 83.3 kg   SpO2 96%   BMI 24.23 kg/m   Physical Exam Vitals and nursing note reviewed.  Constitutional:      Appearance: He is well-developed.  HENT:     Head: Normocephalic.  Cardiovascular:     Rate and Rhythm: Normal rate and regular rhythm.  Pulmonary:     Effort: Pulmonary effort is normal.     Breath sounds: Normal breath sounds.  Abdominal:     General: Bowel sounds are absent. There is distension.     Palpations: Abdomen is soft.     Tenderness: There is abdominal tenderness. There is no guarding or rebound.     Comments: Abdomen distended but not tense. Moderately tender diffusely.   Musculoskeletal:        General: Normal range of motion.     Cervical back: Normal range of motion and neck supple.  Skin:    General: Skin is warm and dry.     Findings: No rash.  Neurological:     General: No focal deficit present.     Mental Status: He is alert and oriented to person, place, and  time.     ED Results / Procedures / Treatments   Labs (all labs ordered are listed, but only abnormal results are displayed) Labs Reviewed  COMPREHENSIVE METABOLIC PANEL - Abnormal; Notable for the following components:      Result Value   Sodium 134 (*)    Potassium 3.2 (*)    Glucose, Bld 110 (*)    Calcium 7.3 (*)    Total Protein 6.4 (*)    All other components within normal limits  LIPASE, BLOOD  CBC WITH DIFFERENTIAL/PLATELET  LACTIC ACID, PLASMA  LACTIC ACID, PLASMA  URINALYSIS, ROUTINE W REFLEX MICROSCOPIC    EKG None  Radiology DG Abd Acute W/Chest  Result Date: 01/07/2021 CLINICAL DATA:  Left side abdominal pain, nausea, vomiting EXAM: DG ABDOMEN ACUTE WITH 1 VIEW CHEST COMPARISON:  06/23/2011 FINDINGS: Prior CABG. Heart and mediastinal contours are within normal limits. No focal opacities or effusions. Calcified granuloma at the left base. Gas within mildly prominent transverse colon. No small bowel dilatation to suggest small bowel obstruction. No organomegaly or free air. No suspicious calcification. IMPRESSION: Mild gaseous distention of the transverse colon, likely mild ileus. No evidence of bowel obstruction or free air. No acute cardiopulmonary disease. Electronically Signed   By: Rolm Baptise M.D.   On: 01/07/2021 22:16    Procedures Procedures   Medications Ordered in ED Medications - No data to display  ED Course  I have reviewed the triage vital signs and the nursing notes.  Pertinent labs & imaging results that were available during my care of the patient were reviewed by me and considered in my medical decision making (see chart for details).    MDM Rules/Calculators/A&P                          Patient to ED with abdominal pain, decreased bowel movements, N, V, h/o SBO. No fever.   He is very well appearing. Alert, oriented, in NAD. VSS. Labs significant for mild hypocalcemia. No symptoms currently. Will recommend supplements, recheck with PCP.  Mild hypokalemia 3.2. Will supplement x 3 days. No sign  of infection or sepsis.  Plain film does not show any obstruction. Will obtain CT for further evaluation. The patient declines medications for pain or nausea for now.   CT c/w diarrheal illness. No obstruction. Results explained to the patient. He is appropriate for discharge home. He reports his wife cannot drive at night so he will board in the ED until morning when she can drive to pick him up.   Final Clinical Impression(s) / ED Diagnoses Final diagnoses:  None   1. Abdominal pain 2. Gastroenteritis 3. Hypokalemia 4. Hypocalcemia   Rx / DC Orders ED Discharge Orders    None       Charlann Lange, PA-C 01/08/21 [id number]    Maudie Flakes, MD 01/08/21 [phone]

## 2021-01-07 NOTE — ED Triage Notes (Signed)
Emergency Medicine Provider Triage Evaluation Note  Brandon Ray , a 85 y.o. male  was evaluated in triage.  Pt complains of abdominal pain.  Reports for the past 4 days he has had persistent and worsening abdominal pain and distension.  He has not been able to have a bowel movement and reports vomiting and dry-heaving. Feels like the last time he has a bowel obstruction several years ago.  Review of Systems  Positive: Abdominal pain, nausea, vomiting, constipation Negative: Chest pain, fever  Physical Exam  BP 122/75 (BP Location: Left Arm)   Pulse 78   Temp 97.9 F (36.6 C) (Oral)   Resp 15   Ht $R'6\' 1"'pt$  (1.854 m)   Wt 83.3 kg   SpO2 96%   BMI 24.23 kg/m  Gen:   Awake, no distress   HEENT:  Atraumatic  Resp:  Normal effort  Cardiac:  Normal rate  Abd:   Abdomen is distended and diffusely tender to palpation MSK:   Moves extremities without difficulty  Neuro:  Speech clear   Medical Decision Making  Medically screening exam initiated at 9:46 PM.  Appropriate orders placed.  Brandon Ray was informed that the remainder of the evaluation will be completed by another provider, this initial triage assessment does not replace that evaluation, and the importance of remaining in the ED until their evaluation is complete.  High clinical suspicion for bowel obstruction.  Appropriate work-up ordered.  High priority for placement into a bed for further evaluation.  Clinical Impression  1.  Abdominal pain 2.  Abdominal distention 3.  Vomiting   Jacqlyn Larsen, [place] 01/07/21 2153

## 2021-01-08 DIAGNOSIS — M47816 Spondylosis without myelopathy or radiculopathy, lumbar region: Secondary | ICD-10-CM | POA: Diagnosis not present

## 2021-01-08 DIAGNOSIS — N3289 Other specified disorders of bladder: Secondary | ICD-10-CM | POA: Diagnosis not present

## 2021-01-08 DIAGNOSIS — R109 Unspecified abdominal pain: Secondary | ICD-10-CM | POA: Diagnosis not present

## 2021-01-08 DIAGNOSIS — R197 Diarrhea, unspecified: Secondary | ICD-10-CM | POA: Diagnosis not present

## 2021-01-08 LAB — URINALYSIS, ROUTINE W REFLEX MICROSCOPIC
Bacteria, UA: NONE SEEN
Bilirubin Urine: NEGATIVE
Glucose, UA: NEGATIVE mg/dL
Hgb urine dipstick: NEGATIVE
Ketones, ur: 5 mg/dL — AB
Leukocytes,Ua: NEGATIVE
Nitrite: NEGATIVE
Protein, ur: 30 mg/dL — AB
Specific Gravity, Urine: >1.046 — ABNORMAL HIGH (ref 1.005–1.030)
pH: 5 (ref 5.0–8.0)

## 2021-01-08 MED ORDER — PHENOBARBITAL 32.4 MG PO TABS
97.2000 mg | ORAL_TABLET | Freq: Once | ORAL | Status: AC
  Administered 2021-01-08: 97.2 mg via ORAL
  Filled 2021-01-08: qty 3

## 2021-01-08 MED ORDER — ONDANSETRON HCL 4 MG/2ML IJ SOLN
4.0000 mg | Freq: Once | INTRAMUSCULAR | Status: AC
  Administered 2021-01-08: 4 mg via INTRAVENOUS
  Filled 2021-01-08: qty 2

## 2021-01-08 MED ORDER — POTASSIUM CHLORIDE ER 10 MEQ PO TBCR: 10.0000 meq | EXTENDED_RELEASE_TABLET | Freq: Two times a day (BID) | ORAL | 0 refills | Status: DC

## 2021-01-08 MED ORDER — ONDANSETRON 4 MG PO TBDP: 4.0000 mg | ORAL_TABLET | Freq: Three times a day (TID) | ORAL | 0 refills | Status: DC | PRN

## 2021-01-08 NOTE — Discharge Instructions (Signed)
Take Zofran every 8 hours as needed to control nausea. Push fluids.   Follow up with your doctor in 2-3 days to insure you are getting better.   If symptoms worsen, please return to the emergency department at any time for further evaluation and treatment.   Your calcium is mildly low. You can supplement this with over-the-counter supplements and have this rechecked with your doctor. Your potassium was also mildly low and a 3-day prescription has been called to your pharmacy to correct this. It is ok to wait until you are over your current GI illness to start these.

## 2021-01-08 NOTE — ED Notes (Signed)
Medications follow up appts reviewed w/ pt. Denies questions or concerns. Tx for abd pain. Workup[p WNL. States relief @ this time.  Left w/ even and steady gait. NAD noted. PIV removed and VSS. Left w/ PCT in wheelchair. NAD noted. VSS.

## 2021-01-14 DIAGNOSIS — M1712 Unilateral primary osteoarthritis, left knee: Secondary | ICD-10-CM | POA: Diagnosis not present

## 2021-01-14 DIAGNOSIS — S82832D Other fracture of upper and lower end of left fibula, subsequent encounter for closed fracture with routine healing: Secondary | ICD-10-CM | POA: Diagnosis not present

## 2021-02-12 DIAGNOSIS — M6281 Muscle weakness (generalized): Secondary | ICD-10-CM | POA: Diagnosis not present

## 2021-02-12 DIAGNOSIS — M25562 Pain in left knee: Secondary | ICD-10-CM | POA: Diagnosis not present

## 2021-02-12 DIAGNOSIS — R269 Unspecified abnormalities of gait and mobility: Secondary | ICD-10-CM | POA: Diagnosis not present

## 2021-02-18 DIAGNOSIS — R269 Unspecified abnormalities of gait and mobility: Secondary | ICD-10-CM | POA: Insufficient documentation

## 2021-02-18 DIAGNOSIS — M6281 Muscle weakness (generalized): Secondary | ICD-10-CM | POA: Insufficient documentation

## 2021-02-22 ENCOUNTER — Telehealth: Payer: Self-pay

## 2021-02-22 DIAGNOSIS — M810 Age-related osteoporosis without current pathological fracture: Secondary | ICD-10-CM

## 2021-02-22 NOTE — Telephone Encounter (Signed)
Prolia VOB initiated via [email or web address]  Last OV: 10/17/20 Next OV:  Last Prolia inj: 10/17/20 Next Prolia inj DUE: 04/17/21

## 2021-02-25 DIAGNOSIS — R269 Unspecified abnormalities of gait and mobility: Secondary | ICD-10-CM | POA: Diagnosis not present

## 2021-02-25 DIAGNOSIS — Z96651 Presence of right artificial knee joint: Secondary | ICD-10-CM | POA: Diagnosis not present

## 2021-02-25 DIAGNOSIS — S82832D Other fracture of upper and lower end of left fibula, subsequent encounter for closed fracture with routine healing: Secondary | ICD-10-CM | POA: Diagnosis not present

## 2021-02-25 DIAGNOSIS — M25562 Pain in left knee: Secondary | ICD-10-CM | POA: Diagnosis not present

## 2021-02-25 DIAGNOSIS — M6281 Muscle weakness (generalized): Secondary | ICD-10-CM | POA: Diagnosis not present

## 2021-02-25 DIAGNOSIS — M25561 Pain in right knee: Secondary | ICD-10-CM | POA: Diagnosis not present

## 2021-02-27 DIAGNOSIS — M6281 Muscle weakness (generalized): Secondary | ICD-10-CM | POA: Diagnosis not present

## 2021-02-27 DIAGNOSIS — M25562 Pain in left knee: Secondary | ICD-10-CM | POA: Diagnosis not present

## 2021-02-27 DIAGNOSIS — R269 Unspecified abnormalities of gait and mobility: Secondary | ICD-10-CM | POA: Diagnosis not present

## 2021-03-02 DIAGNOSIS — M6281 Muscle weakness (generalized): Secondary | ICD-10-CM | POA: Diagnosis not present

## 2021-03-02 DIAGNOSIS — R269 Unspecified abnormalities of gait and mobility: Secondary | ICD-10-CM | POA: Diagnosis not present

## 2021-03-02 DIAGNOSIS — M25562 Pain in left knee: Secondary | ICD-10-CM | POA: Diagnosis not present

## 2021-03-04 DIAGNOSIS — M6281 Muscle weakness (generalized): Secondary | ICD-10-CM | POA: Diagnosis not present

## 2021-03-04 DIAGNOSIS — M25562 Pain in left knee: Secondary | ICD-10-CM | POA: Diagnosis not present

## 2021-03-04 DIAGNOSIS — R269 Unspecified abnormalities of gait and mobility: Secondary | ICD-10-CM | POA: Diagnosis not present

## 2021-03-05 DIAGNOSIS — L570 Actinic keratosis: Secondary | ICD-10-CM | POA: Diagnosis not present

## 2021-03-05 DIAGNOSIS — D485 Neoplasm of uncertain behavior of skin: Secondary | ICD-10-CM | POA: Diagnosis not present

## 2021-03-05 DIAGNOSIS — L820 Inflamed seborrheic keratosis: Secondary | ICD-10-CM | POA: Diagnosis not present

## 2021-03-05 DIAGNOSIS — L821 Other seborrheic keratosis: Secondary | ICD-10-CM | POA: Diagnosis not present

## 2021-03-09 DIAGNOSIS — M6281 Muscle weakness (generalized): Secondary | ICD-10-CM | POA: Diagnosis not present

## 2021-03-09 DIAGNOSIS — M25562 Pain in left knee: Secondary | ICD-10-CM | POA: Diagnosis not present

## 2021-03-09 DIAGNOSIS — R269 Unspecified abnormalities of gait and mobility: Secondary | ICD-10-CM | POA: Diagnosis not present

## 2021-03-12 DIAGNOSIS — M25562 Pain in left knee: Secondary | ICD-10-CM | POA: Diagnosis not present

## 2021-03-12 DIAGNOSIS — M6281 Muscle weakness (generalized): Secondary | ICD-10-CM | POA: Diagnosis not present

## 2021-03-12 DIAGNOSIS — R269 Unspecified abnormalities of gait and mobility: Secondary | ICD-10-CM | POA: Diagnosis not present

## 2021-03-12 NOTE — Telephone Encounter (Signed)
PA initiated via [email or web address]  [name] (Key: [id number])Need help? Call us at [phone] Status Sent to [employer] Next Steps The plan will fax you a determination, typically within 1 to 5 business days.  How do I follow up? Drug Prolia $RemoveBef'60MG'mhuDQFZcPK$ /ML syringes Form [employer] Specialty Medicare Part B Prolia and Xgeva Injectable Precertification Request Form Precertification for Prolia and Xgeva [phone]

## 2021-03-16 DIAGNOSIS — M25562 Pain in left knee: Secondary | ICD-10-CM | POA: Diagnosis not present

## 2021-03-16 DIAGNOSIS — R269 Unspecified abnormalities of gait and mobility: Secondary | ICD-10-CM | POA: Diagnosis not present

## 2021-03-16 DIAGNOSIS — M6281 Muscle weakness (generalized): Secondary | ICD-10-CM | POA: Diagnosis not present

## 2021-03-17 ENCOUNTER — Telehealth: Payer: Self-pay | Admitting: Pharmacy Technician

## 2021-03-17 NOTE — Telephone Encounter (Signed)
[facility] Patient Advocate Encounter  Prior Authorization for [place] has been approved.    PA# [id number] Effective dates: 03/14/2021 through 03/14/2022   [facility] will continue to follow.   Venida Jarvis. Nadara Mustard, CPhT Patient Advocate [facility] Phone: [phone] Fax:  [phone]

## 2021-03-18 DIAGNOSIS — M25562 Pain in left knee: Secondary | ICD-10-CM | POA: Diagnosis not present

## 2021-03-18 DIAGNOSIS — M6281 Muscle weakness (generalized): Secondary | ICD-10-CM | POA: Diagnosis not present

## 2021-03-18 DIAGNOSIS — R269 Unspecified abnormalities of gait and mobility: Secondary | ICD-10-CM | POA: Diagnosis not present

## 2021-03-25 DIAGNOSIS — M25562 Pain in left knee: Secondary | ICD-10-CM | POA: Diagnosis not present

## 2021-03-25 DIAGNOSIS — M6281 Muscle weakness (generalized): Secondary | ICD-10-CM | POA: Diagnosis not present

## 2021-03-25 DIAGNOSIS — R269 Unspecified abnormalities of gait and mobility: Secondary | ICD-10-CM | POA: Diagnosis not present

## 2021-03-27 DIAGNOSIS — M6281 Muscle weakness (generalized): Secondary | ICD-10-CM | POA: Diagnosis not present

## 2021-03-27 DIAGNOSIS — M25562 Pain in left knee: Secondary | ICD-10-CM | POA: Diagnosis not present

## 2021-03-27 DIAGNOSIS — R269 Unspecified abnormalities of gait and mobility: Secondary | ICD-10-CM | POA: Diagnosis not present

## 2021-04-01 DIAGNOSIS — M6281 Muscle weakness (generalized): Secondary | ICD-10-CM | POA: Diagnosis not present

## 2021-04-01 DIAGNOSIS — M25562 Pain in left knee: Secondary | ICD-10-CM | POA: Diagnosis not present

## 2021-04-01 DIAGNOSIS — R269 Unspecified abnormalities of gait and mobility: Secondary | ICD-10-CM | POA: Diagnosis not present

## 2021-04-02 NOTE — Telephone Encounter (Signed)
Pt ready for scheduling on or after 04/17/21  Out-of-pocket cost due at time of visit: $280  Primary: [employer] Medicare Prolia co-insurance: 20% ($255) Admin fee co-insurance: 20% ($25)  Secondary: n/a Prolia co-insurance:  Admin fee co-insurance:   Deductible: does not apply  Prior Auth: APPROVED PA# [id number] Valid: 03/14/21-03/14/22

## 2021-04-03 DIAGNOSIS — M25562 Pain in left knee: Secondary | ICD-10-CM | POA: Diagnosis not present

## 2021-04-03 DIAGNOSIS — M6281 Muscle weakness (generalized): Secondary | ICD-10-CM | POA: Diagnosis not present

## 2021-04-03 DIAGNOSIS — R269 Unspecified abnormalities of gait and mobility: Secondary | ICD-10-CM | POA: Diagnosis not present

## 2021-04-03 NOTE — Telephone Encounter (Signed)
LMTCB to schedule Prolia injection  Out-of-pocket cost due at time of visit: $280

## 2021-04-07 ENCOUNTER — Other Ambulatory Visit: Payer: Self-pay | Admitting: Internal Medicine

## 2021-04-08 ENCOUNTER — Telehealth: Payer: Self-pay | Admitting: Pharmacy Technician

## 2021-04-08 DIAGNOSIS — E7849 Other hyperlipidemia: Secondary | ICD-10-CM | POA: Diagnosis not present

## 2021-04-08 DIAGNOSIS — R269 Unspecified abnormalities of gait and mobility: Secondary | ICD-10-CM | POA: Diagnosis not present

## 2021-04-08 DIAGNOSIS — M25562 Pain in left knee: Secondary | ICD-10-CM | POA: Diagnosis not present

## 2021-04-08 DIAGNOSIS — M6281 Muscle weakness (generalized): Secondary | ICD-10-CM | POA: Diagnosis not present

## 2021-04-08 DIAGNOSIS — I25810 Atherosclerosis of coronary artery bypass graft(s) without angina pectoris: Secondary | ICD-10-CM | POA: Diagnosis not present

## 2021-04-08 LAB — HEPATIC FUNCTION PANEL
ALT: 42 IU/L (ref 0–44)
AST: 43 IU/L — ABNORMAL HIGH (ref 0–40)
Albumin: 4.5 g/dL (ref 3.6–4.6)
Alkaline Phosphatase: 99 IU/L (ref 44–121)
Bilirubin Total: 0.4 mg/dL (ref 0.0–1.2)
Bilirubin, Direct: 0.16 mg/dL (ref 0.00–0.40)
Total Protein: 6.4 g/dL (ref 6.0–8.5)

## 2021-04-08 LAB — LIPID PANEL
Chol/HDL Ratio: 2.5 ratio (ref 0.0–5.0)
Cholesterol, Total: 162 mg/dL (ref 100–199)
HDL: 66 mg/dL (ref >39)
LDL Chol Calc (NIH): 84 mg/dL (ref 0–99)
Triglycerides: 58 mg/dL (ref 0–149)
VLDL Cholesterol Cal: 12 mg/dL (ref 5–40)

## 2021-04-08 NOTE — Telephone Encounter (Addendum)
Patient Advocate Encounter   Received notification from [facility] that prior authorization for TESTOSTERONE is required.   PA submitted on 04/08/2021 Key [id number] Status is APPROVED 09/20/2020 TO 09/19/2021    [facility] will continue to follow.   Venida Jarvis. Nadara Mustard, CPhT Patient Advocate [facility] Phone: [phone] Fax:  [phone]

## 2021-04-10 DIAGNOSIS — M25562 Pain in left knee: Secondary | ICD-10-CM | POA: Diagnosis not present

## 2021-04-10 DIAGNOSIS — M6281 Muscle weakness (generalized): Secondary | ICD-10-CM | POA: Diagnosis not present

## 2021-04-10 DIAGNOSIS — R269 Unspecified abnormalities of gait and mobility: Secondary | ICD-10-CM | POA: Diagnosis not present

## 2021-04-16 ENCOUNTER — Encounter (INDEPENDENT_AMBULATORY_CARE_PROVIDER_SITE_OTHER): Payer: Medicare HMO | Admitting: Ophthalmology

## 2021-04-23 ENCOUNTER — Ambulatory Visit (INDEPENDENT_AMBULATORY_CARE_PROVIDER_SITE_OTHER): Payer: Medicare HMO | Admitting: Ophthalmology

## 2021-04-23 ENCOUNTER — Other Ambulatory Visit: Payer: Self-pay

## 2021-04-23 ENCOUNTER — Encounter (INDEPENDENT_AMBULATORY_CARE_PROVIDER_SITE_OTHER): Payer: Self-pay | Admitting: Ophthalmology

## 2021-04-23 DIAGNOSIS — H532 Diplopia: Secondary | ICD-10-CM

## 2021-04-23 DIAGNOSIS — H43813 Vitreous degeneration, bilateral: Secondary | ICD-10-CM

## 2021-04-23 DIAGNOSIS — H353133 Nonexudative age-related macular degeneration, bilateral, advanced atrophic without subfoveal involvement: Secondary | ICD-10-CM

## 2021-04-23 DIAGNOSIS — H353212 Exudative age-related macular degeneration, right eye, with inactive choroidal neovascularization: Secondary | ICD-10-CM

## 2021-04-23 NOTE — Assessment & Plan Note (Signed)
No signs of CNVM OU

## 2021-04-23 NOTE — Assessment & Plan Note (Signed)
No signs of recurrent CNVM OD

## 2021-04-23 NOTE — Assessment & Plan Note (Signed)
Patient reports that Dr. Marygrace Drought has informed him that this condition has improved if not resolved

## 2021-04-23 NOTE — Assessment & Plan Note (Signed)
The nature of posterior vitreous detachment was discussed with the patient as well as its physiology, its age prevalence, and its possible implication regarding retinal breaks and detachment.  An informational brochure was offered to the patient.  All the patient's questions were answered.  The patient was asked to return if new or different flashes or floaters develops.   Patient was instructed to contact office immediately if any new changes were noticed. I explained to the patient that vitreous inside the eye is similar to jello inside a bowl. As the jello melts it can start to pull away from the bowl, similarly the vitreous throughout our lives can begin to pull away from the retina. That process is called a posterior vitreous detachment. In some cases, the vitreous can tug hard enough on the retina to form a retinal tear. I discussed with the patient the signs and symptoms of a retinal detachment.  Do not rub the eye.

## 2021-04-23 NOTE — Progress Notes (Signed)
04/23/2021     CHIEF COMPLAINT Patient presents for Retina Follow Up (Patient states both eyes he notices a little bit of fog, making his vision more blurred than it was. )   HISTORY OF PRESENT ILLNESS: Brandon Ray is a 86 y.o. male who presents to the clinic today for:   HPI     Retina Follow Up           Diagnosis: Dry AMD   Laterality: both eyes   Onset: 1 year ago   Duration: 1 year   Course: stable   Comments: Patient states both eyes he notices a little bit of fog, making his vision more blurred than it was.        Last edited by Laurin Coder, COA on 04/23/2021  8:39 AM.      Referring physician: Hulan Fess, MD [place],  [place] [id number]  HISTORICAL INFORMATION:   Selected notes from the MEDICAL RECORD NUMBER    Lab Results  Component Value Date   HGBA1C 6.1 09/28/2012     CURRENT MEDICATIONS: No current outpatient medications on file. (Ophthalmic Drugs)   No current facility-administered medications for this visit. (Ophthalmic Drugs)   Current Outpatient Medications (Other)  Medication Sig   aspirin EC 81 MG tablet Take 81 mg by mouth daily.   atorvastatin (LIPITOR) 10 MG tablet Take 1/2 tablet ( 5 mg ) daily   calcium-vitamin D (OSCAL WITH D) 500-200 MG-UNIT per tablet Take 1 tablet by mouth daily at 2 PM.   Cholecalciferol (VITAMIN D-3) 5000 units TABS Take 5,000 Units by mouth every evening. Take 5000 IU 5 x week   Cyanocobalamin (B-12) 2500 MCG TABS Take by mouth.   Multiple Vitamins-Minerals (PRESERVISION AREDS 2 PO) Take 2 capsules by mouth at bedtime.    Omega-3 Fatty Acids (FISH OIL) 1200 MG CAPS Take 1,200 mg by mouth at bedtime.    ondansetron (ZOFRAN ODT) 4 MG disintegrating tablet Take 1 tablet (4 mg total) by mouth every 8 (eight) hours as needed for nausea or vomiting.   PHENobarbital (LUMINAL) 97.2 MG tablet Take 97.2 mg by mouth at bedtime.   polyethylene glycol (MIRALAX / GLYCOLAX) 17 g packet Take 17 g by  mouth daily. Pt uses as neded.   potassium chloride (KLOR-CON) 10 MEQ tablet Take 1 tablet (10 mEq total) by mouth 2 (two) times daily.   pregabalin (LYRICA) 150 MG capsule Take 150 mg by mouth 2 (two) times daily.   tamsulosin (FLOMAX) 0.4 MG CAPS capsule Take 0.4 mg by mouth at bedtime.    testosterone (ANDROGEL) 50 MG/5GM (1%) GEL APPLY 1 PACKET TO SKIN 5 OUT OF 7 DAYS   No current facility-administered medications for this visit. (Other)      REVIEW OF SYSTEMS:    ALLERGIES Allergies  Allergen Reactions   Amoxicillin Anaphylaxis and Rash    Upper torso only. Has patient had a PCN reaction causing immediate rash, facial/tongue/throat swelling, SOB or lightheadedness with hypotension: No Has patient had a PCN reaction causing severe rash involving mucus membranes or skin necrosis: No Has patient had a PCN reaction that required hospitalization: No Has patient had a PCN reaction occurring within the last 10 years: Yes If all of the above answers are "NO", then may proceed with Cephalosporin use.    Tetanus Toxoids Other (See Comments)    TDP vaccine- family stated this caused neuropathy.   Boostrix [Tetanus-Diphth-Acell Pertussis] Other (See Comments)  Neuropathy symptoms   Other Other (See Comments)    Flu shot-neuropathy symptoms    PAST MEDICAL HISTORY Past Medical History:  Diagnosis Date   Arthritis    BPH (benign prostatic hypertrophy) 2014   Coronary artery disease 01/2003   CABG   Free monoclonal light chain    urine   History of blood transfusion 2004   Hyperlipidemia    on medication   Macular degeneration    both eyes   Peripheral neuropathy 05/2011    follow up with Dr. Tish Frederickson multiple issues   Pleural effusion 05/2011   from abdominal surgery   Seizures ([place]) 1950's   last one 1961   Small bowel obstruction ([place]) 05/2011   due to adhesion   Weakness 2013-May began   lower  from neuropathy from knees down   Past Surgical History:  Procedure  Laterality Date   ABDOMINAL SURGERY  05/2011   bowel obstruction   ANKLE FRACTURE SURGERY  1997   left   CARDIAC CATHETERIZATION  01/27/2003   NORMAL LEFT VENTRICULAR SIZE WITH MILD FOCAL LATERAL WALL HYPOKINESIA. EF 60%   CORONARY ARTERY BYPASS GRAFT  2004   LIMA GRAFT TO THE LAD, SAPHENOUS VEIN GRAFT TO THE DIAGONAL, SAPHENOUS VEIN GRAFT TO THE FIRST OM, SAPHENOUS VEIN GRAFT TO THE PDA   HAND SURGERY  2000   left   TOTAL KNEE ARTHROPLASTY Right 01/03/2018   Procedure: RIGHT TOTAL KNEE ARTHROPLASTY;  Surgeon: Paralee Cancel, MD;  Location: [facility];  Service: Orthopedics;  Laterality: Right;   TRANSURETHRAL RESECTION OF PROSTATE  1992   TRANSURETHRAL RESECTION OF PROSTATE N/A 12/22/2012   Procedure: TRANSURETHRAL RESECTION OF THE PROSTATE WITH GYRUS (TURP);  Surgeon: Fredricka Bonine, MD;  Location: [facility];  Service: Urology;  Laterality: N/A;    FAMILY HISTORY Family History  Problem Relation Age of Onset   Asthma Mother    Heart disease Mother 56   Coronary artery disease Father    Heart disease Father    Cancer Sister 75       breast    SOCIAL HISTORY Social History   Tobacco Use   Smoking status: Former    Packs/day: 1.00    Years: 5.00    Pack years: 5.00    Types: Cigarettes    Quit date: 07/01/1965    Years since quitting: 55.8   Smokeless tobacco: Never  Vaping Use   Vaping Use: Never used  Substance Use Topics   Alcohol use: No   Drug use: No         OPHTHALMIC EXAM:  Base Eye Exam     Visual Acuity (ETDRS)       Right Left   Dist Century 20/25 -1 20/25         Tonometry (Tonopen, 8:43 AM)       Right Left   Pressure 11 10         Pupils       Pupils Dark Light Shape React APD   Right PERRL 5 4 Round Brisk None   Left PERRL 5 4 Round Brisk None         Visual Fields (Counting fingers)       Left Right    Full Full         Extraocular Movement       Right Left    Full Full         Neuro/Psych      Oriented x3: Yes  Mood/Affect: Normal         Dilation     Both eyes: 1.0% Mydriacyl, 2.5% Phenylephrine @ 8:43 AM           Slit Lamp and Fundus Exam     External Exam       Right Left   External Normal Normal         Slit Lamp Exam       Right Left   Lids/Lashes Normal Normal   Conjunctiva/Sclera White and quiet White and quiet   Cornea Clear Clear   Anterior Chamber Deep and quiet Deep and quiet   Iris Round and reactive Round and reactive   Lens Posterior chamber intraocular lens Posterior chamber intraocular lens   Anterior Vitreous Normal Normal         Fundus Exam       Right Left   Posterior Vitreous Central vitreous floaters, Posterior vitreous detachment Posterior vitreous detachment   Disc Normal Normal   C/D Ratio 0.25 0.45   Macula Geographic atrophy, Soft drusen, no hemorrhage, no exudates, no macular thickening Retinal pigment epithelial mottling, no hemorrhage, Soft drusen, no exudates, no macular thickening   Vessels Normal Normal   Periphery Normal Normal            IMAGING AND PROCEDURES  Imaging and Procedures for 04/23/21  OCT, Retina - OU - Both Eyes       Right Eye Quality was good. Scan locations included subfoveal. Central Foveal Thickness: 264. Progression has been stable. Findings include retinal drusen .   Left Eye Quality was good. Scan locations included subfoveal. Central Foveal Thickness: 279. Progression has been stable. Findings include retinal drusen .   Notes No signs of CN VM OU.     Color Fundus Photography Optos - OU - Both Eyes       Right Eye Progression has been stable. Disc findings include normal observations. Macula : drusen. Vessels : normal observations. Periphery : normal observations.   Left Eye Progression has been stable. Disc findings include normal observations. Macula : drusen. Vessels : normal observations. Periphery : normal observations.               ASSESSMENT/PLAN:  Diplopia Patient reports that Dr. 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  An informational brochure was offered to the patient.  All the patient's questions were answered.  The patient was asked to return if new or different flashes or floaters develops.   Patient was instructed to contact office immediately if any new changes were noticed. I explained to the patient that vitreous inside the eye is similar to jello inside a bowl. As the jello melts it can start to pull away from the bowl, similarly the vitreous throughout our lives can begin to pull away from the retina. That process is called a posterior vitreous detachment. In some cases, the vitreous can tug hard enough on the retina to form a retinal tear. I discussed with the patient the signs and symptoms of a retinal detachment.  Do not rub the eye.       ICD-10-CM   1. Posterior vitreous detachment of both eyes  H43.813     2. Exudative age-related macular degeneration of right eye with inactive choroidal neovascularization (HCC)  H35.3212 OCT, Retina - OU - Both Eyes    Color Fundus Photography Optos - OU - Both Eyes    3. 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 Diplopia  H53.2       1.  2.  3.  Ophthalmic Meds Ordered this  visit:  No orders of the defined types were placed in this encounter.      Return in about 1 year (around 04/23/2022) for DILATE OU, OCT, COLOR FP.  There are no Patient Instructions on file for this visit.   Explained the diagnoses, plan, and follow up with the patient and they expressed understanding.  Patient expressed understanding of the importance of proper follow up care.   Clent Demark Jakwan Sally M.D. Diseases & Surgery of the Retina and Vitreous [facility] [place] 04/23/21     Abbreviations: M myopia (nearsighted); A astigmatism; H hyperopia (farsighted); P presbyopia; Mrx spectacle prescription;  CTL contact lenses; OD right eye; OS left eye; OU both eyes  XT exotropia; ET esotropia; PEK punctate epithelial keratitis; PEE punctate epithelial erosions; DES dry eye syndrome; MGD meibomian gland dysfunction; ATs artificial tears; PFAT's preservative free artificial tears; [place] nuclear sclerotic cataract; PSC posterior subcapsular cataract; ERM epi-retinal membrane; PVD posterior vitreous detachment; RD retinal detachment; DM diabetes mellitus; DR diabetic retinopathy; NPDR non-proliferative diabetic retinopathy; PDR proliferative diabetic retinopathy; CSME clinically significant macular edema; DME diabetic macular edema; dbh dot blot hemorrhages; CWS cotton wool spot; POAG primary open angle glaucoma; C/D cup-to-disc ratio; HVF humphrey visual field; GVF goldmann visual field; OCT optical coherence tomography; IOP intraocular pressure; BRVO Branch retinal vein occlusion; CRVO central retinal vein occlusion; CRAO central retinal artery occlusion; BRAO branch retinal artery occlusion; RT retinal tear; SB scleral buckle; PPV pars plana vitrectomy; VH Vitreous hemorrhage; PRP panretinal laser photocoagulation; IVK intravitreal kenalog; VMT vitreomacular traction; MH Macular hole;  NVD neovascularization of the disc; NVE neovascularization elsewhere; AREDS age related eye disease study; ARMD  age related macular degeneration; POAG primary open angle glaucoma; EBMD epithelial/anterior basement membrane dystrophy; ACIOL anterior chamber intraocular lens; IOL intraocular lens; PCIOL posterior chamber intraocular lens; Phaco/IOL phacoemulsification with intraocular lens placement; [place] photorefractive keratectomy; LASIK laser assisted in situ keratomileusis; HTN hypertension; DM diabetes mellitus; COPD chronic obstructive pulmonary disease

## 2021-04-24 ENCOUNTER — Ambulatory Visit: Payer: Medicare HMO

## 2021-04-24 DIAGNOSIS — M810 Age-related osteoporosis without current pathological fracture: Secondary | ICD-10-CM | POA: Diagnosis not present

## 2021-04-24 MED ORDER — DENOSUMAB 60 MG/ML ~~LOC~~ SOSY
60.0000 mg | PREFILLED_SYRINGE | Freq: Once | SUBCUTANEOUS | Status: AC
  Administered 2021-04-24: 60 mg via SUBCUTANEOUS

## 2021-04-24 NOTE — Progress Notes (Signed)
Prolia injection administered to pt's left arm. Pt tolerated well.

## 2021-04-25 NOTE — Telephone Encounter (Signed)
Pt received Prolia inj 04/24/21 Next inj due 10/26/21

## 2021-05-29 DIAGNOSIS — M792 Neuralgia and neuritis, unspecified: Secondary | ICD-10-CM | POA: Diagnosis not present

## 2021-05-29 DIAGNOSIS — Z8669 Personal history of other diseases of the nervous system and sense organs: Secondary | ICD-10-CM | POA: Diagnosis not present

## 2021-05-29 DIAGNOSIS — M6281 Muscle weakness (generalized): Secondary | ICD-10-CM | POA: Diagnosis not present

## 2021-05-29 DIAGNOSIS — G629 Polyneuropathy, unspecified: Secondary | ICD-10-CM | POA: Diagnosis not present

## 2021-05-29 DIAGNOSIS — Z23 Encounter for immunization: Secondary | ICD-10-CM | POA: Diagnosis not present

## 2021-05-29 DIAGNOSIS — G610 Guillain-Barre syndrome: Secondary | ICD-10-CM | POA: Diagnosis not present

## 2021-05-29 DIAGNOSIS — D472 Monoclonal gammopathy: Secondary | ICD-10-CM | POA: Diagnosis not present

## 2021-07-30 DIAGNOSIS — L089 Local infection of the skin and subcutaneous tissue, unspecified: Secondary | ICD-10-CM | POA: Diagnosis not present

## 2021-07-30 DIAGNOSIS — Z5189 Encounter for other specified aftercare: Secondary | ICD-10-CM | POA: Diagnosis not present

## 2021-08-05 ENCOUNTER — Encounter: Payer: Self-pay | Admitting: Podiatry

## 2021-08-05 ENCOUNTER — Ambulatory Visit: Payer: Medicare HMO | Admitting: Podiatry

## 2021-08-05 ENCOUNTER — Other Ambulatory Visit: Payer: Self-pay

## 2021-08-05 DIAGNOSIS — L603 Nail dystrophy: Secondary | ICD-10-CM | POA: Diagnosis not present

## 2021-08-05 DIAGNOSIS — M79675 Pain in left toe(s): Secondary | ICD-10-CM

## 2021-08-05 DIAGNOSIS — M21372 Foot drop, left foot: Secondary | ICD-10-CM | POA: Diagnosis not present

## 2021-08-05 DIAGNOSIS — H3530 Unspecified macular degeneration: Secondary | ICD-10-CM | POA: Insufficient documentation

## 2021-08-05 DIAGNOSIS — G629 Polyneuropathy, unspecified: Secondary | ICD-10-CM

## 2021-08-05 DIAGNOSIS — Z951 Presence of aortocoronary bypass graft: Secondary | ICD-10-CM | POA: Insufficient documentation

## 2021-08-05 DIAGNOSIS — M79674 Pain in right toe(s): Secondary | ICD-10-CM | POA: Diagnosis not present

## 2021-08-05 DIAGNOSIS — M21371 Foot drop, right foot: Secondary | ICD-10-CM

## 2021-08-05 DIAGNOSIS — B351 Tinea unguium: Secondary | ICD-10-CM | POA: Diagnosis not present

## 2021-08-05 NOTE — Progress Notes (Signed)
  Subjective:  Patient ID: Brandon Ray, male    DOB: 05-14-1935,   MRN: [id number]  Chief Complaint  Patient presents with   Nail Problem    I have some toenails that are discolored and thick and the right big toenail I cut on the shower    86 y.o. male presents for concern of bilateral thickened elongated and painful toenails. States he has a history of neuropathy from his knees down that is attributed to a vaccine. States he has no feeling in his feet and wants to make sure everything looks good and he is taking care of them well . Has a history of foot drop and currently wears AFOs. Denies any other pedal complaints. Denies n/v/f/c.   Past Medical History:  Diagnosis Date   Arthritis    BPH (benign prostatic hypertrophy) 2014   Coronary artery disease 01/2003   CABG   Free monoclonal light chain    urine   History of blood transfusion 2004   Hyperlipidemia    on medication   Macular degeneration    both eyes   Peripheral neuropathy 05/2011    follow up with Dr. Tish Frederickson multiple issues   Pleural effusion 05/2011   from abdominal surgery   Seizures ([place]) 1950's   last one 1961   Small bowel obstruction ([place]) 05/2011   due to adhesion   Weakness 2013-May began   lower  from neuropathy from knees down    Objective:  Physical Exam: Vascular: DP/PT pulses 2/4 bilateral. CFT <3 seconds. Normal hair growth on digits. No edema.  Skin. No lacerations bilateral feet. Mild xerosis of skin. Small abrasion of right distal hallux. Healing well.  Musculoskeletal: MMT 5/5 bilateral lower extremities in  PF, Inversion and Eversion. 3/5 DF strength. Foot drop bilateral noted.  Deceased ROM in DF of ankle joint.  Neurological: Sensation diminished to light touch. Protective sensation diminished.   Assessment:   1. Neuropathy   2. Bilateral foot-drop   3. Onychodystrophy   4. Pain due to onychomycosis of toenails of both feet      Plan:  Patient was evaluated and treated and all  questions answered. Discussed neuropathy and fungal toenails  and etiology as well as treatment with patient.  -Discussed and educated patient on foot care, especially with  regards to the vascular, neurological and musculoskeletal systems.  -Discussed supportive shoes at all times and checking feet regularly.  - Nails 1-5 mechanically debrided as courtesy today.  -Follow-up with PCP for prescription management of gabapentin.  -Abrasion healing well band aid applied.  -Patient to return in 3 months for follow-up evaluation.    Lorenda Peck, DPM

## 2021-09-18 ENCOUNTER — Telehealth: Payer: Self-pay

## 2021-09-18 ENCOUNTER — Other Ambulatory Visit (HOSPITAL_COMMUNITY): Payer: Self-pay

## 2021-09-18 NOTE — Telephone Encounter (Signed)
The PA for the Testosterone gel packets expires on 09/19/22.  The copay is $4.49.

## 2021-10-01 ENCOUNTER — Ambulatory Visit: Payer: Medicare HMO | Admitting: Cardiology

## 2021-10-01 NOTE — Telephone Encounter (Signed)
2023 VOB initiated for Prolia

## 2021-10-06 ENCOUNTER — Other Ambulatory Visit: Payer: Self-pay | Admitting: Internal Medicine

## 2021-10-13 NOTE — Telephone Encounter (Signed)
Pt ready for scheduling on or after 10/26/21  Out-of-pocket cost due at time of visit: $301  Primary: [employer] Prolia co-insurance: 20% (approximately $276) Admin fee co-insurance: 20% (approximately $25)  Secondary: n/a Prolia co-insurance:  Admin fee co-insurance:   Deductible: does not apply  Prior Auth: APPROVED PA# [id number] Valid: 03/14/21-03/14/22  ** This summary of benefits is an estimation of the patient's out-of-pocket cost. Exact cost may vary based on individual plan coverage.

## 2021-10-15 DIAGNOSIS — D472 Monoclonal gammopathy: Secondary | ICD-10-CM | POA: Diagnosis not present

## 2021-10-20 ENCOUNTER — Encounter: Payer: Self-pay | Admitting: Internal Medicine

## 2021-10-20 ENCOUNTER — Other Ambulatory Visit: Payer: Self-pay

## 2021-10-20 ENCOUNTER — Ambulatory Visit (INDEPENDENT_AMBULATORY_CARE_PROVIDER_SITE_OTHER): Payer: Medicare HMO | Admitting: Internal Medicine

## 2021-10-20 VITALS — BP 128/60 | HR 88 | Ht 73.0 in | Wt 192.2 lb

## 2021-10-20 DIAGNOSIS — E538 Deficiency of other specified B group vitamins: Secondary | ICD-10-CM | POA: Diagnosis not present

## 2021-10-20 DIAGNOSIS — M810 Age-related osteoporosis without current pathological fracture: Secondary | ICD-10-CM

## 2021-10-20 DIAGNOSIS — E291 Testicular hypofunction: Secondary | ICD-10-CM

## 2021-10-20 MED ORDER — TESTOSTERONE 50 MG/5GM (1%) TD GEL: TRANSDERMAL | 3 refills | Status: DC

## 2021-10-20 NOTE — Patient Instructions (Signed)
Please restart testosterone gel 5 g 5/7 days.  Continue the same B12 and D vitamin supplements.  Continue Prolia.  Please come back for a follow-up appointment in 1 year.

## 2021-10-20 NOTE — Progress Notes (Addendum)
Subjective:     Patient ID: Brandon Ray, male   DOB: 05-20-1935, 87 y.o.   MRN: [id number]  This visit occurred during the SARS-CoV-2 public health emergency.  Safety protocols were in place, including screening questions prior to the visit, additional usage of staff PPE, and extensive cleaning of exam room while observing appropriate contact time as indicated for disinfecting solutions.   HPI Brandon Ray is a very pleasant 87 y.o. man with h/o severe polyneuropathy and monoclonal gammopathy, returning for f/u for primary hypogonadism, osteoporosis + h/o sacral fracture, low B12. Last visit 1 year ago.  He is here with his wife who helps him walk as he is a good resource.  Interim history: Since last visit, he had a fall in the park in 12/2020 >> L fibular fracture - now healed. She also had a fall following a trip in the house, but without fractures. No dizziness/orthostasis.  He continues to have disequilibrium.  Reviewed  history: He continues to have severe neuropathy. He also still has discomfort in the mid thoracic region, pelvic region and his hands.  He was previously investigated for POEMS sd. - prev. Seen by Dr Lamonte Sakai, then had an evaluation by Dr. Karie Kirks. Tuchman (Heme/Onc at [facility]) for this, but it was concluded that he actually has MGUS and will be just followed for it. He also had a fat pad biopsy to rule out amyloidosis and this was negative.   Please see my previous notes regarding patient's past medical history. He does have polyneuropathy, monoclonal gammopathy, gynecomastia and primary hypogonadism, but no organomegaly or significant skin changes. He also sees Dr. Tessa Lerner in pain clinic,  Dr Trinna Post (neurology) at the [facility]. Dr. Rushie Goltz is his local neurologist ([facility]). 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   She was on Neurontin high doses but this was not helping >> sees [facility] Clinic at [facility] >> tried Cymbalta + Neurontin >> did not help >>  now on Lyrica, which helps some.  Primary hypogonadism: -We initially started him on testosterone gel 5 g daily, then we increased to 7.5 g daily.  He could not tolerate this dose due to increased libido, pelvic tension, breast tension.   -We then decrease the dose to 5 g 5 out of 7 days, however, he did stop this in 06/2019.  Due to weakness, he was interested in restarting it - approved by his insurance. -he restarted the testosterone but then stopped in 06/2020 around the time of his skin surgery but again he was interested in restarting it.  Therefore, we did not check his testosterone level in 2022. -he is currently on testosterone gel 5 g 5 out of 7 days -but missed the last few days as he could not obtain it from the pharmacy.  Reviewed pertinent labs: Component     Latest Ref Rng & Units 10/19/2019  Testosterone, Serum (Total)     ng/dL 24 (L)  % Free Testosterone     % 0.6  Free Testosterone, S     pg/mL 1.4 (L)  Sex Hormone Binding Globulin     nmol/L 100.7 (H)  PSA     0.10 - 4.00 ng/mL 0.10   Component     Latest Ref Rng & Units 10/17/2018  Testosterone, Serum (Total)     ng/dL 477  % Free Testosterone     % 0.8  Free Testosterone, S     pg/mL 38 (L)  Sex Hormone Binding Globulin  nmol/L 79.6 (H)   Component     Latest Ref Rng & Units 10/19/2017  Testosterone, Serum (Total)     ng/dL 674  % Free Testosterone     % 1.1  Free Testosterone, S     pg/mL 74  Sex Hormone Binding Globulin     nmol/L 80.9 (H)   Component     Latest Ref Rng & Units 10/20/2015 10/19/2016  Testosterone     264 - 916 ng/dL 251 862  Sex Horm Binding Glob, Serum     19.3 - 76.4 nmol/L 55 71.5  Testosterone Free     6.6 - 18.1 pg/mL 34.6 (L) 10.3  Testosterone-% Free     1.6 - 2.9 % 1.4 (L)    Reviewed PSA levels: Lab Results  Component Value Date   PSA 0.10 10/19/2019   PSA 0.47 10/19/2017   PSA 0.20 01/31/2017   PSA 5.89 (H) 10/19/2016   PSA 0.30 10/20/2015   PSA 0.35  04/16/2015   PSA 0.48 03/13/2014   PSA 0.15 02/09/2013   Reviewed his recent hemoglobin/hematocrit: 10/15/2021: Hemoglobin 13.7 - 17.3 g/dL 16.0   Hematocrit 39.0 - 49.0 % 48.4    Lab Results  Component Value Date   WBC 4.2 01/07/2021   HGB 15.3 01/07/2021   HCT 46.2 01/07/2021   MCV 96.9 01/07/2021   PLT 193 01/07/2021  10/12/2017: 16/47.80 10/15/2015: Hb 15.8/HT 47.8 04/15/2015: Hb 16.4 (13.7-17.3)/HT 50.2 (39-49)  He sees his urologist on a regular basis and has DRE's every year >> normal reportedly.  She also has a history of gynecomastia, with normal mammograms.  This is likely secondary to his primary hypogonadism.  It has not improved after starting testosterone.  Prolactin level was normal.He also has dysesthesia at the level of his nipples, probably related to his neuropathy.  This improved on Lyrica.  She has a history of being on high-dose steroids for his neuropathy-initially started at 60 mg in 07/1999>> stopped prednisone 09/2013.  Patient with clinical osteoporosis: Osteopenic BMD, however, with a sacral fracture likely secondary to steroid treatment, age, and hypogonadism.    Review latest bone density ([place] reports-T-scores improved:  DXA 07/02/2019: L1-L4 T score of +1.1 (+6.7%*) and right femoral neck -1.1, left femoral neck -0.8 (mean femoral neck: +1.9%)  DXA 11/28/2016: L1-L4 T score of +0.4 (+3.3%*) and right femoral neck -1.3, left femoral neck -0.8 (mean femoral neck: +3.0%)  DXA 11/10/2014: L1-L4 T score of +0.1 and right femoral neck -1.4, left femoral neck -1.2 DXA 11/06/2012: L1-L4 T score of -0.9 and dual femur T score of -1.1  We started Prolia:   Latest vitamin D levels were normal except for a slightly low level at last visit: Lab Results  Component Value Date   VD25OH 28.6 (L) 10/17/2020   VD25OH 67.06 10/19/2019   VD25OH 91.84 10/17/2018   VD25OH 62.54 10/19/2017   VD25OH 58.28 10/19/2016   VD25OH 54.39 10/20/2015   VD25OH 64.27  04/16/2015   VD25OH 56.34 10/04/2014   VD25OH 67 09/14/2013   VD25OH 55 09/28/2012  We increased his 5000 units vitamin D from 5/7 days to 7/7 days.   He also takes 400 units vitamin D-600 mg calcium.  He has a history of low B12, but this normalized: Lab Results  Component Value Date   VITAMINB12 933 (H) 10/17/2020   VITAMINB12 >1500 (H) 10/19/2019   VITAMINB12 1,251 (H) 10/17/2018   VITAMINB12 1,309 (H) 10/19/2017   VITAMINB12 >1500 (H) 01/31/2017   VITAMINB12 225  10/19/2016  At last visit, he was on B12 p.o. 5000 mcg every other day, but since the B12 level was still high, I advised him to only take this twice a week. He continues on phenobarbital since 1961.   Review of Systems + see HPI + fatigue Musculoskeletal: + muscle aches/no joint aches Neurological: no tremors/+ numbness/+ tingling/no dizziness  I reviewed pt's medications, allergies, PMH, social hx, family hx, and changes were documented in the history of present illness. Otherwise, unchanged from my initial visit note.  Past Medical History:  Diagnosis Date   Arthritis    BPH (benign prostatic hypertrophy) 2014   Coronary artery disease 01/2003   CABG   Free monoclonal light chain    urine   History of blood transfusion 2004   Hyperlipidemia    on medication   Macular degeneration    both eyes   Peripheral neuropathy 05/2011    follow up with Dr. Tish Frederickson multiple issues   Pleural effusion 05/2011   from abdominal surgery   Seizures ([place]) 1950's   last one 1961   Small bowel obstruction ([place]) 05/2011   due to adhesion   Weakness 2013-May began   lower  from neuropathy from knees down   Past Surgical History:  Procedure Laterality Date   ABDOMINAL SURGERY  05/2011   bowel obstruction   ANKLE FRACTURE SURGERY  1997   left   CARDIAC CATHETERIZATION  01/27/2003   NORMAL LEFT VENTRICULAR SIZE WITH MILD FOCAL LATERAL WALL HYPOKINESIA. EF 60%   CORONARY ARTERY BYPASS GRAFT  2004   LIMA GRAFT TO THE LAD,  SAPHENOUS VEIN GRAFT TO THE DIAGONAL, SAPHENOUS VEIN GRAFT TO THE FIRST OM, SAPHENOUS VEIN GRAFT TO THE PDA   HAND SURGERY  2000   left   TOTAL KNEE ARTHROPLASTY Right 01/03/2018   Procedure: RIGHT TOTAL KNEE ARTHROPLASTY;  Surgeon: Paralee Cancel, MD;  Location: [facility];  Service: Orthopedics;  Laterality: Right;   TRANSURETHRAL RESECTION OF PROSTATE  1992   TRANSURETHRAL RESECTION OF PROSTATE N/A 12/22/2012   Procedure: TRANSURETHRAL RESECTION OF THE PROSTATE WITH GYRUS (TURP);  Surgeon: Fredricka Bonine, MD;  Location[facility];  Service: Urology;  Laterality: N/A;   Social History   Socioeconomic History   Marital status: Married    Spouse name: Not on file   Number of children: 4   Years of education: Not on file   Highest education level: Not on file  Occupational History   Occupation: Freight forwarder    Comment: retired, Manufacturing systems engineer firm.   Tobacco Use   Smoking status: Former    Packs/day: 1.00    Years: 5.00    Pack years: 5.00    Types: Cigarettes    Quit date: 07/01/1965    Years since quitting: 56.3   Smokeless tobacco: Never  Vaping Use   Vaping Use: Never used  Substance and Sexual Activity   Alcohol use: No   Drug use: No   Sexual activity: Not on file  Other Topics Concern   Not on file  Social History Narrative   Not on file   Social Determinants of Health   Financial Resource Strain: Not on file  Food Insecurity: Not on file  Transportation Needs: Not on file  Physical Activity: Not on file  Stress: Not on file  Social Connections: Not on file  Intimate Partner Violence: Not on file   Current Outpatient Medications on File Prior to Visit  Medication Sig Dispense Refill  aspirin 81 MG EC tablet Take by mouth.     atorvastatin (LIPITOR) 10 MG tablet Take 1/2 tablet ( 5 mg ) daily 45 tablet 3   calcium-vitamin D (OSCAL WITH D) 500-200 MG-UNIT per tablet Take 1 tablet by mouth daily at 2 PM.     Cholecalciferol (VITAMIN D-3)  5000 units TABS Take 5,000 Units by mouth every evening. Take 5000 IU 5 x week     Cyanocobalamin (B-12) 2500 MCG TABS Take by mouth.     Omega-3 Fatty Acids (FISH OIL) 1200 MG CAPS Take 1,200 mg by mouth at bedtime.      PHENobarbital (LUMINAL) 97.2 MG tablet Take 97.2 mg by mouth at bedtime.     polyethylene glycol (MIRALAX / GLYCOLAX) 17 g packet Take 17 g by mouth daily. Pt uses as neded.     pregabalin (LYRICA) 150 MG capsule Take 150 mg by mouth 2 (two) times daily.     tamsulosin (FLOMAX) 0.4 MG CAPS capsule Take 0.4 mg by mouth at bedtime.      testosterone (ANDROGEL) 50 MG/5GM (1%) GEL APPLY 1 PACKET TO SKIN 5 OUT OF 7 DAYS 300 g 3   No current facility-administered medications on file prior to visit.   Allergies  Allergen Reactions   Amoxicillin Anaphylaxis and Rash    Upper torso only. Has patient had a PCN reaction causing immediate rash, facial/tongue/throat swelling, SOB or lightheadedness with hypotension: No Has patient had a PCN reaction causing severe rash involving mucus membranes or skin necrosis: No Has patient had a PCN reaction that required hospitalization: No Has patient had a PCN reaction occurring within the last 10 years: Yes If all of the above answers are "NO", then may proceed with Cephalosporin use.    Tetanus Toxoids Other (See Comments)    TDP vaccine- family stated this caused neuropathy.   Boostrix [Tetanus-Diphth-Acell Pertussis] Other (See Comments)    Neuropathy symptoms   Other Other (See Comments)    Flu shot-neuropathy symptoms   Family History  Problem Relation Age of Onset   Asthma Mother    Heart disease Mother 29   Coronary artery disease Father    Heart disease Father    Cancer Sister 53       breast    Objective:   Physical Exam BP 128/60 (BP Location: Left Arm, Patient Position: Sitting, Cuff Size: Normal)    Pulse 88    Ht $R'6\' 1"'EW$  (1.854 m)    Wt 192 lb 3.2 oz (87.2 kg)    SpO2 94%    BMI 25.36 kg/m   Wt Readings from Last 3  Encounters:  10/30/21 192 lb (87.1 kg)  10/20/21 192 lb 3.2 oz (87.2 kg)  01/07/21 183 lb 10.3 oz (83.3 kg)   Constitutional: overweight, in NAD, walks with a cane Eyes: PERRLA, EOMI, no exophthalmos ENT: moist mucous membranes, no thyromegaly, no cervical lymphadenopathy Cardiovascular: RRR, No MRG, no LE edema on exam Respiratory: CTA B Musculoskeletal: no deformities, strength intact in all 4 Skin: moist, warm, no rashes Neurological: no tremor with outstretched hands  Assessment:     1. Primary hypogonadism - + gynecomastia bilaterally (per mammogram) + testicular atrophy - He is on testosterone gel >> a sensation of pressure in abdomen, but this did not subside after stopping testosterone gel for 1 mo after his surgery for Transurethral resection of prostate residual growth in 12/22/2012.   2. Osteoporosis - h/o sacral fracture, right big toe fracture (04/2013), spine X-ray: no  vb fx - Off steroids now - on adequate Ca+vit D - On Prolia, no side effects  3.  Low vitamin B12  4. Polyneuropathy - unlikely POEMS syndrome per evaluation by Hem/Onc at [facility] - amyloidosis ruled out by negative fat pad biopsy - managed by Dr. Trinna Post at [facility] and Dr. Doree Albee at [facility]   5. Long-term use of steroids - previously on prednisone 60 mg starting at the end of 2013 - Was able to taper >> now off since 09/2013  6.  MGUS - monoclonal gammopathy - now managed by Dr.Tuchman at [facility], previously Dr. Lamonte Sakai     Plan:     1. Primary hypogonadism -He is back on AndroGel 1% 5 g 5 to 7 days.  He was missing doses in the past when his grandchildren were visiting, to avoid transfer.  He never felt a significant benefit from the testosterone and even had pelvic pressure (but it was not clear whether this was related to testosterone treatment since he held testosterone in the past and was still experiencing the symptoms).  He continues to see Dr. Junious Silk with urology and he is  up-to-date with his DRE's.  At last visit, his testosterone level was slightly low, but he expressed his wish to maybe come off testosterone if possible after the latest bone density returned.   -However, afterwards, he felt weak or off testosterone and we restarted it.  Before last visit, he stopped the testosterone around the time of his skin surgery.  After last visit, he restarted testosterone gel, taking 5 g 5 out of 7 days.  He ran out of this 2 to 3 days ago as he could not refill it from the pharmacy (he could not get the testosterone packets) -we will recheck a PSA and testosterone-will return for these in a week.  A CBC was recently normal per review of records from [facility].  2. Osteoporosis  -He has a history of osteopenia on DXA scans but he does have a history of sacral fracture, giving him a diagnosis of osteoporosis. -He has been on prednisone and Dilantin in the past (now off) and also on testosterone replacement -He had a fibular fracture after our last visit.  We discussed that only 1 fracture does not necessarily imply Prolia failure.  For now, I suggested to continue with this. -Latest bone density report from 07/02/2019 showed a stable BMD at the hips and improvement at the spine.  He is due for another bone mineral density.  I will order this. -He continues on Prolia, which he started in 2016.  He has no side effects from Prolia: No jaw/thigh/hip pain.  He has posterior lower back pain radiating to the hip, which is not new. -Newer studies showed continuous improvement even beyond 10 years.  We will plan to continue Prolia for now -At last visit he was on 5000 units vitamin D 5 out of 7 days, but vitamin D was slightly low so I advised him to increase the dose to 5000 units daily.  He continues on this dose -We will recheck the vitamin D level in 1 week -at the time of his Prolia injection -He continues on calcium 600 mg daily -I will see him back in 1 year  3.  Low vitamin  B12 -We checked his vitamin B12 level in the past during investigation for possible causes for neuropathy.  This returned low so we started B12 supplementation, initially on 5000 mcg daily -Since vitamin B12 normalized  and was occasionally even above target range, we decreased the dose to 5000 mcg twice a week -Last level was slightly above target at last visit -We will recheck his B12 in a week, when she returns for the rest of the labs.  Orders Placed This Encounter  Procedures   DG Bone Density   Vitamin D, 25-hydroxy   Vitamin B12   Testosterone, Free, Total, SHBG   PSA   CC: Drs. Gable, [place]  Component     Latest Ref Rng & Units 10/28/2021  Testosterone     264 - 916 ng/dL 542  Testosterone Free     6.6 - 18.1 pg/mL 5.2 (L)  Sex Horm Binding Glob, Serum     19.3 - 76.4 nmol/L 78.0 (H)  Vitamin B12     211 - 911 pg/mL 640  PSA     0.10 - 4.00 ng/mL 0.97  VITD     30.00 - 100.00 ng/mL 60.84   Vitamin B12, PSA, vitamin D are all normal. Free testosterone is slightly low.  I do not feel that this is low enough to trigger an increase in dose.  Philemon Kingdom, MD PhD [facility] Endocrinology

## 2021-10-22 NOTE — Telephone Encounter (Signed)
Pt called to schedule lab appt. Appt scheduled and pt notified.

## 2021-10-23 DIAGNOSIS — R3914 Feeling of incomplete bladder emptying: Secondary | ICD-10-CM | POA: Diagnosis not present

## 2021-10-23 DIAGNOSIS — N139 Obstructive and reflux uropathy, unspecified: Secondary | ICD-10-CM | POA: Diagnosis not present

## 2021-10-23 DIAGNOSIS — N401 Enlarged prostate with lower urinary tract symptoms: Secondary | ICD-10-CM | POA: Diagnosis not present

## 2021-10-28 ENCOUNTER — Ambulatory Visit (INDEPENDENT_AMBULATORY_CARE_PROVIDER_SITE_OTHER): Payer: Medicare HMO

## 2021-10-28 ENCOUNTER — Other Ambulatory Visit (INDEPENDENT_AMBULATORY_CARE_PROVIDER_SITE_OTHER): Payer: Medicare HMO

## 2021-10-28 ENCOUNTER — Other Ambulatory Visit: Payer: Self-pay

## 2021-10-28 DIAGNOSIS — E291 Testicular hypofunction: Secondary | ICD-10-CM | POA: Diagnosis not present

## 2021-10-28 DIAGNOSIS — E538 Deficiency of other specified B group vitamins: Secondary | ICD-10-CM | POA: Diagnosis not present

## 2021-10-28 DIAGNOSIS — M810 Age-related osteoporosis without current pathological fracture: Secondary | ICD-10-CM

## 2021-10-28 LAB — VITAMIN D 25 HYDROXY (VIT D DEFICIENCY, FRACTURES): VITD: 60.84 ng/mL (ref 30.00–100.00)

## 2021-10-28 LAB — VITAMIN B12: Vitamin B-12: 640 pg/mL (ref 211–911)

## 2021-10-28 LAB — PSA: PSA: 0.97 ng/mL (ref 0.10–4.00)

## 2021-10-28 MED ORDER — DENOSUMAB 60 MG/ML ~~LOC~~ SOSY
60.0000 mg | PREFILLED_SYRINGE | Freq: Once | SUBCUTANEOUS | Status: AC
  Administered 2021-10-28: 60 mg via SUBCUTANEOUS

## 2021-10-28 NOTE — Progress Notes (Signed)
Prolia injection administered to pt's left arm. Pt tolerated well.

## 2021-10-29 NOTE — Progress Notes (Signed)
Cardiology Office Note:    Date:  10/30/2021   ID:  Brandon Ray, DOB 1934-12-23, MRN [id number]  PCP:  Philemon Kingdom, MD   [facility] Providers Cardiologist:  Peter Martinique, MD     Referring MD: No ref. provider found   Chief Complaint: 12 mo follow-up CAD  History of Present Illness:    Brandon Ray is a 86 y.o. male with a hx of CAD, MI, small bowel obstruction, hyperlipidemia, and polyneuropathy.   History of CAD s/p CABG 2004 following NSTEMI with LIMA to LAD, SVG to diagonal, SVG to OM1, SVG to PDA.  He had severe three-vessel disease with normal LV function.   He was last seen in our office by Dr. Martinique on 08/20/2020 at which time no changes were made and one year follow-up was recommended.   Today, he is here with his wife.  He is recovering from a left fibula fracture that occurred in the Fall of 2022 and limited walking for 5 weeks. Prior to injury, he was walking 1 1/2 miles daily. Now, he is walking on treadmill 3 days per week for 26-27 min at a slow pace.  He  denies chest pain, shortness of breath, fatigue, palpitations, melena, hematuria, hemoptysis, diaphoresis, weakness, presyncope, syncope, orthopnea, and PND.  Has mild bilateral lower extremity edema that has been present for many years.  He wears braces on both lower extremities for peripheral neuropathy and weakness.  He elevates his legs on a consistent basis. Has an agreement with Dr. Martinique to take atorvastatin 10 mg every other day due to history of intolerance of higher doses of statin.  He is followed at [facility] for MGUS and sees Dr. Cruzita Lederer for primary male hypogonadism, osteoporosis, and low serum vitamin B12.  BP is at goal, he is not on antihypertensives. He has no specific concerns today.   Past Medical History:  Diagnosis Date   Arthritis    BPH (benign prostatic hypertrophy) 2014   Coronary artery disease 01/2003   CABG   Free monoclonal light chain    urine   History of blood  transfusion 2004   Hyperlipidemia    on medication   Macular degeneration    both eyes   Peripheral neuropathy 05/2011    follow up with Dr. Tish Frederickson multiple issues   Pleural effusion 05/2011   from abdominal surgery   Seizures ([place]) 1950's   last one 1961   Small bowel obstruction ([place]) 05/2011   due to adhesion   Weakness 2013-May began   lower  from neuropathy from knees down    Past Surgical History:  Procedure Laterality Date   ABDOMINAL SURGERY  05/2011   bowel obstruction   ANKLE FRACTURE SURGERY  1997   left   CARDIAC CATHETERIZATION  01/27/2003   NORMAL LEFT VENTRICULAR SIZE WITH MILD FOCAL LATERAL WALL HYPOKINESIA. EF 60%   CORONARY ARTERY BYPASS GRAFT  2004   LIMA GRAFT TO THE LAD, SAPHENOUS VEIN GRAFT TO THE DIAGONAL, SAPHENOUS VEIN GRAFT TO THE FIRST OM, SAPHENOUS VEIN GRAFT TO THE PDA   HAND SURGERY  2000   left   TOTAL KNEE ARTHROPLASTY Right 01/03/2018   Procedure: RIGHT TOTAL KNEE ARTHROPLASTY;  Surgeon: Paralee Cancel, MD;  Location: [facility];  Service: Orthopedics;  Laterality: Right;   TRANSURETHRAL RESECTION OF PROSTATE  1992   TRANSURETHRAL RESECTION OF PROSTATE N/A 12/22/2012   Procedure: TRANSURETHRAL RESECTION OF THE PROSTATE WITH GYRUS (TURP);  Surgeon: Fredricka Bonine, MD;  Location:  [facility];  Service: Urology;  Laterality: N/A;    Current Medications: Current Meds  Medication Sig   aspirin 81 MG EC tablet Take by mouth.   atorvastatin (LIPITOR) 10 MG tablet Take 1/2 tablet ( 5 mg ) daily   calcium-vitamin D (OSCAL WITH D) 500-200 MG-UNIT per tablet Take 1 tablet by mouth daily at 2 PM.   Cholecalciferol (VITAMIN D-3) 5000 units TABS Take 5,000 Units by mouth every evening. Take 5000 IU 5 x week   Cyanocobalamin (B-12) 2500 MCG TABS Take by mouth.   Omega-3 Fatty Acids (FISH OIL) 1200 MG CAPS Take 1,200 mg by mouth at bedtime.    PHENobarbital (LUMINAL) 97.2 MG tablet Take 97.2 mg by mouth at bedtime.   polyethylene glycol  (MIRALAX / GLYCOLAX) 17 g packet Take 17 g by mouth daily. Pt uses as neded.   pregabalin (LYRICA) 150 MG capsule Take 150 mg by mouth 2 (two) times daily.   tamsulosin (FLOMAX) 0.4 MG CAPS capsule Take 0.4 mg by mouth at bedtime.    testosterone (ANDROGEL) 50 MG/5GM (1%) GEL Apply 1 package on skin 5/7 days     Allergies:   Amoxicillin, Tetanus toxoids, Boostrix [tetanus-diphth-acell pertussis], Diphth-acell pertussis-tetanus, and Other   Social History   Socioeconomic History   Marital status: Married    Spouse name: Not on file   Number of children: 4   Years of education: Not on file   Highest education level: Not on file  Occupational History   Occupation: Freight forwarder    Comment: retired, Manufacturing systems engineer firm.   Tobacco Use   Smoking status: Former    Packs/day: 1.00    Years: 5.00    Pack years: 5.00    Types: Cigarettes    Quit date: 07/01/1965    Years since quitting: 56.3   Smokeless tobacco: Never  Vaping Use   Vaping Use: Never used  Substance and Sexual Activity   Alcohol use: No   Drug use: No   Sexual activity: Not on file  Other Topics Concern   Not on file  Social History Narrative   Not on file   Social Determinants of Health   Financial Resource Strain: Not on file  Food Insecurity: Not on file  Transportation Needs: Not on file  Physical Activity: Not on file  Stress: Not on file  Social Connections: Not on file     Family History: The patient's family history includes Asthma in his mother; Cancer (age of onset: 62) in his sister; Coronary artery disease in his father; Heart disease in his father; Heart disease (age of onset: 47) in his mother.  ROS:   Please see the history of present illness.   + mild bilateral LE swelling All other systems reviewed and are negative.  Labs/Other Studies Reviewed:    The following studies were reviewed today:  Cardiac cath 2004  Severe 3 vessel obstructive atherosclerotic CAD. Normal left ventricular  size with mild focal lateral wall hypokinesia EF 60%  Recent Labs: 01/07/2021: BUN 19; Creatinine, Ser 0.87; Hemoglobin 15.3; Platelets 193; Potassium 3.2; Sodium 134 04/08/2021: ALT 42   Recent Lipid Panel    Component Value Date/Time   CHOL 162 04/08/2021 1006   TRIG 58 04/08/2021 1006   HDL 66 04/08/2021 1006   CHOLHDL 2.5 04/08/2021 1006   CHOLHDL 2.4 09/06/2016 1508   VLDL 15 09/06/2016 1508   LDLCALC 84 04/08/2021 1006     Risk Assessment/Calculations:  Physical Exam:    VS:  BP 120/60 (BP Location: Left Arm, Patient Position: Sitting)    Pulse 71    Ht $R'6\' 1"'ne$  (1.854 m)    Wt 192 lb (87.1 kg)    SpO2 99%    BMI 25.33 kg/m     Wt Readings from Last 3 Encounters:  10/30/21 192 lb (87.1 kg)  10/20/21 192 lb 3.2 oz (87.2 kg)  01/07/21 183 lb 10.3 oz (83.3 kg)     GEN:  Well nourished, well developed in no acute distress HEENT: Normal NECK: No JVD; No carotid bruits CARDIAC: RRR, no murmurs, rubs, gallops RESPIRATORY:  Clear to auscultation without rales, wheezing or rhonchi  ABDOMEN: Soft, non-tender, non-distended MUSCULOSKELETAL:  Mild bilateral lower extremity edema. Braces on bil lower extremities.  2+ pedal pulses, equal bilaterally. Hx of right foot drop.  SKIN: Warm and dry NEUROLOGIC:  Alert and oriented x 3 PSYCHIATRIC:  Normal affect   EKG:  EKG is ordered today.  The ekg ordered today demonstrates NSR at 67 bpm, LAD, right BBB, no acute change from previous.  Diagnoses:    1. Hyperlipidemia LDL goal <70   2. Coronary artery disease involving coronary bypass graft of native heart without angina pectoris   3. Medication management   4. Right bundle branch block    Assessment and Plan:     CAD without angina: He denies chest pain, dyspnea, or other symptoms concerning for angina.  No indication for further ischemic evaluation at this time.  Has mild bruising on arms with aspirin but agrees with continuing for CAD benefit, no additional bleeding  concerns. Continue aspirin, statin.   Hyperlipidemia LDL goal < 70: LDL 84 7/22.  Intolerant to high doses of statins. Will recheck lipid today. Continue atorvastatin 10 mg every other day.   Right bundle branch block: Stable by EKG today. No change in treatment.     Disposition: one year with Dr. Martinique  Medication Adjustments/Labs and Tests Ordered: Current medicines are reviewed at length with the patient today.  Concerns regarding medicines are outlined above.  Orders Placed This Encounter  Procedures   Lipid Profile   EKG 12-Lead   No orders of the defined types were placed in this encounter.   Patient Instructions  Medication Instructions:  The current medical regimen is effective;  continue present plan and medications as directed. Please refer to the Current Medication list given to you today.   *If you need a refill on your cardiac medications before your next appointment, please call your pharmacy*  Lab Work:   Testing/Procedures:  FASTIN LIPID TODAY NONE  Follow-Up: Your next appointment:  12 month(s) In Person with Peter Martinique, MD   Please call our office 2 months in advance to schedule this appointment   At [facility], you and your health needs are our priority.  As part of our continuing mission to provide you with exceptional heart care, we have created designated Provider Care Teams.  These Care Teams include your primary Cardiologist (physician) and Advanced Practice Providers (APPs -  Physician Assistants and Nurse Practitioners) who all work together to provide you with the care you need, when you need it.     Signed, Emmaline Life, NP  10/30/2021 12:35 PM    [facility]

## 2021-10-30 ENCOUNTER — Encounter: Payer: Self-pay | Admitting: General Practice

## 2021-10-30 ENCOUNTER — Other Ambulatory Visit: Payer: Self-pay

## 2021-10-30 ENCOUNTER — Ambulatory Visit: Payer: Medicare HMO | Admitting: Nurse Practitioner

## 2021-10-30 VITALS — BP 120/60 | HR 71 | Ht 73.0 in | Wt 192.0 lb

## 2021-10-30 DIAGNOSIS — I4510 Unspecified right bundle-branch block: Secondary | ICD-10-CM | POA: Diagnosis not present

## 2021-10-30 DIAGNOSIS — Z79899 Other long term (current) drug therapy: Secondary | ICD-10-CM | POA: Diagnosis not present

## 2021-10-30 DIAGNOSIS — I25810 Atherosclerosis of coronary artery bypass graft(s) without angina pectoris: Secondary | ICD-10-CM | POA: Diagnosis not present

## 2021-10-30 DIAGNOSIS — E785 Hyperlipidemia, unspecified: Secondary | ICD-10-CM | POA: Diagnosis not present

## 2021-10-30 DIAGNOSIS — E7849 Other hyperlipidemia: Secondary | ICD-10-CM | POA: Diagnosis not present

## 2021-10-30 LAB — LIPID PANEL
Chol/HDL Ratio: 2.6 ratio (ref 0.0–5.0)
Cholesterol, Total: 160 mg/dL (ref 100–199)
HDL: 62 mg/dL (ref >39)
LDL Chol Calc (NIH): 85 mg/dL (ref 0–99)
Triglycerides: 65 mg/dL (ref 0–149)
VLDL Cholesterol Cal: 13 mg/dL (ref 5–40)

## 2021-10-30 NOTE — Patient Instructions (Signed)
Medication Instructions:  The current medical regimen is effective;  continue present plan and medications as directed. Please refer to the Current Medication list given to you today.   *If you need a refill on your cardiac medications before your next appointment, please call your pharmacy*  Lab Work:   Testing/Procedures:  FASTIN LIPID TODAY NONE  Follow-Up: Your next appointment:  12 month(s) In Person with Peter Martinique, MD   Please call our office 2 months in advance to schedule this appointment   At [facility], you and your health needs are our priority.  As part of our continuing mission to provide you with exceptional heart care, we have created designated Provider Care Teams.  These Care Teams include your primary Cardiologist (physician) and Advanced Practice Providers (APPs -  Physician Assistants and Nurse Practitioners) who all work together to provide you with the care you need, when you need it.

## 2021-11-02 LAB — TESTOSTERONE, FREE, TOTAL, SHBG
Sex Hormone Binding: 78 nmol/L — ABNORMAL HIGH (ref 19.3–76.4)
Testosterone, Free: 5.2 pg/mL — ABNORMAL LOW (ref 6.6–18.1)
Testosterone: 542 ng/dL (ref 264–916)

## 2021-11-04 ENCOUNTER — Other Ambulatory Visit: Payer: Self-pay

## 2021-11-04 ENCOUNTER — Ambulatory Visit (INDEPENDENT_AMBULATORY_CARE_PROVIDER_SITE_OTHER)
Admission: RE | Admit: 2021-11-04 | Discharge: 2021-11-04 | Disposition: A | Discharge status: Home / Self Care | Payer: Medicare HMO | Source: Ambulatory Visit | Attending: Internal Medicine | Admitting: Internal Medicine

## 2021-11-04 DIAGNOSIS — M810 Age-related osteoporosis without current pathological fracture: Secondary | ICD-10-CM

## 2021-11-11 NOTE — Telephone Encounter (Signed)
Last Prolia inj 10/28/21 Next Prolia due 04/28/22

## 2022-01-11 DIAGNOSIS — R1032 Left lower quadrant pain: Secondary | ICD-10-CM | POA: Diagnosis not present

## 2022-01-11 DIAGNOSIS — K5900 Constipation, unspecified: Secondary | ICD-10-CM | POA: Diagnosis not present

## 2022-01-13 DIAGNOSIS — Z0100 Encounter for examination of eyes and vision without abnormal findings: Secondary | ICD-10-CM | POA: Diagnosis not present

## 2022-01-13 DIAGNOSIS — H52203 Unspecified astigmatism, bilateral: Secondary | ICD-10-CM | POA: Diagnosis not present

## 2022-01-13 DIAGNOSIS — H43813 Vitreous degeneration, bilateral: Secondary | ICD-10-CM | POA: Diagnosis not present

## 2022-01-13 DIAGNOSIS — H26493 Other secondary cataract, bilateral: Secondary | ICD-10-CM | POA: Diagnosis not present

## 2022-01-13 DIAGNOSIS — H353232 Exudative age-related macular degeneration, bilateral, with inactive choroidal neovascularization: Secondary | ICD-10-CM | POA: Diagnosis not present

## 2022-02-08 DIAGNOSIS — M25551 Pain in right hip: Secondary | ICD-10-CM | POA: Diagnosis not present

## 2022-02-08 DIAGNOSIS — Z96651 Presence of right artificial knee joint: Secondary | ICD-10-CM | POA: Diagnosis not present

## 2022-02-11 ENCOUNTER — Other Ambulatory Visit: Payer: Self-pay | Admitting: Cardiology

## 2022-02-11 DIAGNOSIS — N139 Obstructive and reflux uropathy, unspecified: Secondary | ICD-10-CM | POA: Diagnosis not present

## 2022-02-19 DIAGNOSIS — M6281 Muscle weakness (generalized): Secondary | ICD-10-CM | POA: Diagnosis not present

## 2022-02-19 DIAGNOSIS — R269 Unspecified abnormalities of gait and mobility: Secondary | ICD-10-CM | POA: Diagnosis not present

## 2022-02-24 DIAGNOSIS — M6281 Muscle weakness (generalized): Secondary | ICD-10-CM | POA: Diagnosis not present

## 2022-02-26 DIAGNOSIS — M25561 Pain in right knee: Secondary | ICD-10-CM | POA: Diagnosis not present

## 2022-03-03 DIAGNOSIS — M6281 Muscle weakness (generalized): Secondary | ICD-10-CM | POA: Diagnosis not present

## 2022-03-05 DIAGNOSIS — M6281 Muscle weakness (generalized): Secondary | ICD-10-CM | POA: Diagnosis not present

## 2022-03-10 DIAGNOSIS — M6281 Muscle weakness (generalized): Secondary | ICD-10-CM | POA: Diagnosis not present

## 2022-03-12 DIAGNOSIS — M6281 Muscle weakness (generalized): Secondary | ICD-10-CM | POA: Diagnosis not present

## 2022-03-16 DIAGNOSIS — M6281 Muscle weakness (generalized): Secondary | ICD-10-CM | POA: Diagnosis not present

## 2022-03-16 DIAGNOSIS — R269 Unspecified abnormalities of gait and mobility: Secondary | ICD-10-CM | POA: Diagnosis not present

## 2022-03-18 DIAGNOSIS — M6281 Muscle weakness (generalized): Secondary | ICD-10-CM | POA: Diagnosis not present

## 2022-03-20 NOTE — Telephone Encounter (Signed)
Prolia VOB initiated via [email or web address]  Last Prolia inj 10/28/21 Next Prolia due 04/28/22  Prior Auth: APPROVED PA# [id number] Valid: 03/14/21-03/14/22

## 2022-03-24 DIAGNOSIS — M6281 Muscle weakness (generalized): Secondary | ICD-10-CM | POA: Diagnosis not present

## 2022-03-26 DIAGNOSIS — R269 Unspecified abnormalities of gait and mobility: Secondary | ICD-10-CM | POA: Diagnosis not present

## 2022-03-26 DIAGNOSIS — M6281 Muscle weakness (generalized): Secondary | ICD-10-CM | POA: Diagnosis not present

## 2022-03-29 DIAGNOSIS — R269 Unspecified abnormalities of gait and mobility: Secondary | ICD-10-CM | POA: Diagnosis not present

## 2022-03-29 DIAGNOSIS — M6281 Muscle weakness (generalized): Secondary | ICD-10-CM | POA: Diagnosis not present

## 2022-04-01 DIAGNOSIS — M6281 Muscle weakness (generalized): Secondary | ICD-10-CM | POA: Diagnosis not present

## 2022-04-03 NOTE — Telephone Encounter (Signed)
Prior auth required for PROLIA  PA PROCESS DETAILS: Precertification is required. Call [phone] or complete the Precertification form available at [email or web address] 

## 2022-04-06 DIAGNOSIS — R269 Unspecified abnormalities of gait and mobility: Secondary | ICD-10-CM | POA: Diagnosis not present

## 2022-04-06 DIAGNOSIS — M6281 Muscle weakness (generalized): Secondary | ICD-10-CM | POA: Diagnosis not present

## 2022-04-07 NOTE — Telephone Encounter (Signed)
PA# [id number] Valid: 04/07/22-04/07/23

## 2022-04-07 NOTE — Telephone Encounter (Signed)
Pt ready for scheduling on or after 04/28/22  Out-of-pocket cost due at time of visit: $301  Primary: Aetna Medicare Prolia co-insurance: 20% (approximately $276) Admin fee co-insurance: 20% (approximately $25)  Secondary: n/a Prolia co-insurance:  Admin fee co-insurance:   Deductible: does not apply  Prior Auth: APPROVED PA# [id number] Valid: 04/07/22-04/07/23   ** This summary of benefits is an estimation of the patient's out-of-pocket cost. Exact cost may vary based on individual plan coverage.

## 2022-04-08 DIAGNOSIS — M25561 Pain in right knee: Secondary | ICD-10-CM | POA: Diagnosis not present

## 2022-04-08 DIAGNOSIS — M6281 Muscle weakness (generalized): Secondary | ICD-10-CM | POA: Diagnosis not present

## 2022-04-08 DIAGNOSIS — R269 Unspecified abnormalities of gait and mobility: Secondary | ICD-10-CM | POA: Diagnosis not present

## 2022-04-08 DIAGNOSIS — M25551 Pain in right hip: Secondary | ICD-10-CM | POA: Diagnosis not present

## 2022-04-13 DIAGNOSIS — M6281 Muscle weakness (generalized): Secondary | ICD-10-CM | POA: Diagnosis not present

## 2022-04-21 DIAGNOSIS — M6281 Muscle weakness (generalized): Secondary | ICD-10-CM | POA: Diagnosis not present

## 2022-04-23 DIAGNOSIS — M6281 Muscle weakness (generalized): Secondary | ICD-10-CM | POA: Diagnosis not present

## 2022-04-26 ENCOUNTER — Encounter (INDEPENDENT_AMBULATORY_CARE_PROVIDER_SITE_OTHER): Payer: Self-pay | Admitting: Ophthalmology

## 2022-04-26 ENCOUNTER — Ambulatory Visit (INDEPENDENT_AMBULATORY_CARE_PROVIDER_SITE_OTHER): Payer: Medicare HMO | Admitting: Ophthalmology

## 2022-04-26 ENCOUNTER — Encounter (INDEPENDENT_AMBULATORY_CARE_PROVIDER_SITE_OTHER): Payer: Medicare HMO | Admitting: Ophthalmology

## 2022-04-26 DIAGNOSIS — H532 Diplopia: Secondary | ICD-10-CM | POA: Diagnosis not present

## 2022-04-26 DIAGNOSIS — H43393 Other vitreous opacities, bilateral: Secondary | ICD-10-CM

## 2022-04-26 DIAGNOSIS — H353133 Nonexudative age-related macular degeneration, bilateral, advanced atrophic without subfoveal involvement: Secondary | ICD-10-CM | POA: Diagnosis not present

## 2022-04-26 DIAGNOSIS — H43813 Vitreous degeneration, bilateral: Secondary | ICD-10-CM

## 2022-04-26 DIAGNOSIS — H353212 Exudative age-related macular degeneration, right eye, with inactive choroidal neovascularization: Secondary | ICD-10-CM | POA: Diagnosis not present

## 2022-04-26 NOTE — Assessment & Plan Note (Signed)
Moderate progressive geographic atrophy will continue to monitor and all for Syfovre upon next visit

## 2022-04-26 NOTE — Assessment & Plan Note (Signed)
Symptomatic

## 2022-04-26 NOTE — Assessment & Plan Note (Signed)
No sign of recurrence

## 2022-04-26 NOTE — Assessment & Plan Note (Signed)
Physiologic OU.  No holes or tears

## 2022-04-26 NOTE — Assessment & Plan Note (Signed)
Patient reports condition has resolved

## 2022-04-26 NOTE — Progress Notes (Signed)
04/26/2022     CHIEF COMPLAINT Patient presents for  Chief Complaint  Patient presents with   Posterior Vitreous Detachment      HISTORY OF PRESENT ILLNESS: Brandon Ray is a 87 y.o. male who presents to the clinic today for:   HPI   1 YR FU OU OCT FP. "I went to my regular ophthalmologist and he said that my vision is just fine while driving. I can read better with my reading glasses." Pt stated vision has been stable since last visit.      Last edited by Silvestre Moment on 04/26/2022  8:24 AM.      Referring physician: Marygrace Drought, MD [address] [place],  [place] [id number]  HISTORICAL INFORMATION:   Selected notes from the MEDICAL RECORD NUMBER    Lab Results  Component Value Date   HGBA1C 6.1 09/28/2012     CURRENT MEDICATIONS: No current outpatient medications on file. (Ophthalmic Drugs)   No current facility-administered medications for this visit. (Ophthalmic Drugs)   Current Outpatient Medications (Other)  Medication Sig   aspirin 81 MG EC tablet Take by mouth.   atorvastatin (LIPITOR) 10 MG tablet TAKE 1/2 TABLET ( 5 MG ) DAILY   calcium-vitamin D (OSCAL WITH D) 500-200 MG-UNIT per tablet Take 1 tablet by mouth daily at 2 PM.   Cholecalciferol (VITAMIN D-3) 5000 units TABS Take 5,000 Units by mouth every evening. Take 5000 IU 5 x week   Cyanocobalamin (B-12) 2500 MCG TABS Take by mouth.   Omega-3 Fatty Acids (FISH OIL) 1200 MG CAPS Take 1,200 mg by mouth at bedtime.    PHENobarbital (LUMINAL) 97.2 MG tablet Take 97.2 mg by mouth at bedtime.   polyethylene glycol (MIRALAX / GLYCOLAX) 17 g packet Take 17 g by mouth daily. Pt uses as neded.   pregabalin (LYRICA) 150 MG capsule Take 150 mg by mouth 2 (two) times daily.   tamsulosin (FLOMAX) 0.4 MG CAPS capsule Take 0.4 mg by mouth at bedtime.    testosterone (ANDROGEL) 50 MG/5GM (1%) GEL Apply 1 package on skin 5/7 days   No current facility-administered medications for this visit. (Other)       REVIEW OF SYSTEMS: ROS   Negative for: Constitutional, Gastrointestinal, Neurological, Skin, Genitourinary, Musculoskeletal, HENT, Endocrine, Cardiovascular, Eyes, Respiratory, Psychiatric, Allergic/Imm, Heme/Lymph Last edited by Silvestre Moment on 04/26/2022  8:24 AM.       ALLERGIES Allergies  Allergen Reactions   Amoxicillin Anaphylaxis and Rash    Upper torso only. Has patient had a PCN reaction causing immediate rash, facial/tongue/throat swelling, SOB or lightheadedness with hypotension: No Has patient had a PCN reaction causing severe rash involving mucus membranes or skin necrosis: No Has patient had a PCN reaction that required hospitalization: No Has patient had a PCN reaction occurring within the last 10 years: Yes If all of the above answers are "NO", then may proceed with Cephalosporin use.    Tetanus Toxoids Other (See Comments)    TDP vaccine- family stated this caused neuropathy.   Boostrix [Tetanus-Diphth-Acell Pertussis] Other (See Comments)    Neuropathy symptoms   Diphth-Acell Pertussis-Tetanus    Other Other (See Comments)    Flu shot-neuropathy symptoms    PAST MEDICAL HISTORY Past Medical History:  Diagnosis Date   Arthritis    BPH (benign prostatic hypertrophy) 2014   Coronary artery disease 01/2003   CABG   Free monoclonal light chain    urine   History of blood transfusion 2004  Hyperlipidemia    on medication   Macular degeneration    both eyes   Peripheral neuropathy 05/2011    follow up with Dr. Tish Frederickson multiple issues   Pleural effusion 05/2011   from abdominal surgery   Seizures ([place]) 1950's   last one 1961   Small bowel obstruction ([place]) 05/2011   due to adhesion   Weakness 2013-May began   lower  from neuropathy from knees down   Past Surgical History:  Procedure Laterality Date   ABDOMINAL SURGERY  05/2011   bowel obstruction   ANKLE FRACTURE SURGERY  1997   left   CARDIAC CATHETERIZATION  01/27/2003   NORMAL LEFT VENTRICULAR  SIZE WITH MILD FOCAL LATERAL WALL HYPOKINESIA. EF 60%   CORONARY ARTERY BYPASS GRAFT  2004   LIMA GRAFT TO THE LAD, SAPHENOUS VEIN GRAFT TO THE DIAGONAL, SAPHENOUS VEIN GRAFT TO THE FIRST OM, SAPHENOUS VEIN GRAFT TO THE PDA   HAND SURGERY  2000   left   TOTAL KNEE ARTHROPLASTY Right 01/03/2018   Procedure: RIGHT TOTAL KNEE ARTHROPLASTY;  Surgeon: Paralee Cancel, MD;  Location: [facility];  Service: Orthopedics;  Laterality: Right;   TRANSURETHRAL RESECTION OF PROSTATE  1992   TRANSURETHRAL RESECTION OF PROSTATE N/A 12/22/2012   Procedure: TRANSURETHRAL RESECTION OF THE PROSTATE WITH GYRUS (TURP);  Surgeon: Fredricka Bonine, MD;  Location[facility];  Service: Urology;  Laterality: N/A;    FAMILY HISTORY Family History  Problem Relation Age of Onset   Asthma Mother    Heart disease Mother 75   Coronary artery disease Father    Heart disease Father    Cancer Sister 80       breast    SOCIAL HISTORY Social History   Tobacco Use   Smoking status: Former    Packs/day: 1.00    Years: 5.00    Total pack years: 5.00    Types: Cigarettes    Quit date: 07/01/1965    Years since quitting: 56.8   Smokeless tobacco: Never  Vaping Use   Vaping Use: Never used  Substance Use Topics   Alcohol use: No   Drug use: No         OPHTHALMIC EXAM:  Base Eye Exam     Visual Acuity (ETDRS)       Right Left   Dist [place] 20/30 -2 20/30 -1   Dist ph [place] 20/25 -2         Tonometry (Tonopen, 8:30 AM)       Right Left   Pressure 10 11         Pupils       Pupils APD   Right PERRL None   Left PERRL None         Visual Fields       Left Right    Full Full         Extraocular Movement       Right Left    Full Full         Neuro/Psych     Oriented x3: Yes   Mood/Affect: Normal         Dilation     Both eyes: 1.0% Mydriacyl, 2.5% Phenylephrine @ 8:30 AM           Slit Lamp and Fundus Exam     External Exam       Right Left    External Normal Normal         Slit  Lamp Exam       Right Left   Lids/Lashes Normal Normal   Conjunctiva/Sclera White and quiet White and quiet   Cornea Clear Clear   Anterior Chamber Deep and quiet Deep and quiet   Iris Round and reactive Round and reactive   Lens Posterior chamber intraocular lens Posterior chamber intraocular lens   Anterior Vitreous Normal Normal         Fundus Exam       Right Left   Posterior Vitreous Central vitreous floaters, Posterior vitreous detachment Posterior vitreous detachment   Disc Normal Normal   C/D Ratio 0.25 0.45   Macula Geographic atrophy, Soft drusen, no hemorrhage, no exudates, no macular thickening Retinal pigment epithelial mottling, no hemorrhage, Soft drusen, no exudates, no macular thickening   Vessels Normal Normal   Periphery Normal Normal            IMAGING AND PROCEDURES  Imaging and Procedures for 04/26/22  OCT, Retina - OU - Both Eyes       Right Eye Quality was good. Scan locations included subfoveal. Central Foveal Thickness: 261. Progression has been stable. Findings include retinal drusen .   Left Eye Quality was good. Scan locations included subfoveal. Central Foveal Thickness: 270. Progression has been stable. Findings include retinal drusen .   Notes No signs of CN VM OU.      Color Fundus Photography Optos - OU - Both Eyes       Right Eye Progression has been stable. Disc findings include normal observations. Macula : drusen, geographic atrophy. Vessels : normal observations. Periphery : normal observations.   Left Eye Progression has been stable. Disc findings include normal observations. Macula : drusen, geographic atrophy. Vessels : normal observations. Periphery : normal observations.              ASSESSMENT/PLAN:  Exudative age-related macular degeneration of right eye with inactive choroidal neovascularization (HCC) No sign of recurrence  Posterior vitreous detachment of both  eyes Physiologic OU.  No holes or tears  Vitreous floaters of both eyes Symptomatic  Advanced nonexudative age-related macular degeneration of both eyes without subfoveal involvement Moderate progressive geographic atrophy will continue to monitor and all for Syfovre upon next visit  Diplopia Patient reports condition has resolved     ICD-10-CM   1. Posterior vitreous detachment of both eyes  H43.813 OCT, Retina - OU - Both Eyes    Color Fundus Photography Optos - OU - Both Eyes    2. Exudative age-related macular degeneration of right eye with inactive choroidal neovascularization ([place])  H35.3212     3. Vitreous floaters of both eyes  H43.393     4. Advanced nonexudative age-related macular degeneration of both eyes without subfoveal involvement  H35.3133     5. Diplopia  H53.2       1.  Mild geographic atrophy OU.  We will discuss Syfovre upon next visit after the issues with Syfovre with retinal vasculitis occlusive vasculitis and vision loss are worked out.  2.  PVD OU.  No sign of complications  3.  Ophthalmic Meds Ordered this visit:  No orders of the defined types were placed in this encounter.      Return in about 6 months (around 10/27/2022) for DILATE OU, OCT, COLOR FP.  There are no Patient Instructions on file for this visit.   Explained the diagnoses, plan, and follow up with the patient and they expressed understanding.  Patient expressed understanding of the importance of  proper follow up care.   Clent Demark Chiann Goffredo M.D. Diseases & Surgery of the Retina and Vitreous [facility] [place] 04/26/22     Abbreviations: M myopia (nearsighted); A astigmatism; H hyperopia (farsighted); P presbyopia; Mrx spectacle prescription;  CTL contact lenses; OD right eye; OS left eye; OU both eyes  XT exotropia; ET esotropia; PEK punctate epithelial keratitis; PEE punctate epithelial erosions; DES dry eye syndrome; MGD meibomian gland dysfunction; ATs artificial  tears; PFAT's preservative free artificial tears; [place] nuclear sclerotic cataract; PSC posterior subcapsular cataract; ERM epi-retinal membrane; PVD posterior vitreous detachment; RD retinal detachment; DM diabetes mellitus; DR diabetic retinopathy; NPDR non-proliferative diabetic retinopathy; PDR proliferative diabetic retinopathy; CSME clinically significant macular edema; DME diabetic macular edema; dbh dot blot hemorrhages; CWS cotton wool spot; POAG primary open angle glaucoma; C/D cup-to-disc ratio; HVF humphrey visual field; GVF goldmann visual field; OCT optical coherence tomography; IOP intraocular pressure; BRVO Branch retinal vein occlusion; CRVO central retinal vein occlusion; CRAO central retinal artery occlusion; BRAO branch retinal artery occlusion; RT retinal tear; SB scleral buckle; PPV pars plana vitrectomy; VH Vitreous hemorrhage; PRP panretinal laser photocoagulation; IVK intravitreal kenalog; VMT vitreomacular traction; MH Macular hole;  NVD neovascularization of the disc; NVE neovascularization elsewhere; AREDS age related eye disease study; ARMD age related macular degeneration; POAG primary open angle glaucoma; EBMD epithelial/anterior basement membrane dystrophy; ACIOL anterior chamber intraocular lens; IOL intraocular lens; PCIOL posterior chamber intraocular lens; Phaco/IOL phacoemulsification with intraocular lens placement; [place] photorefractive keratectomy; LASIK laser assisted in situ keratomileusis; HTN hypertension; DM diabetes mellitus; COPD chronic obstructive pulmonary disease

## 2022-04-27 DIAGNOSIS — M6281 Muscle weakness (generalized): Secondary | ICD-10-CM | POA: Diagnosis not present

## 2022-04-29 DIAGNOSIS — M6281 Muscle weakness (generalized): Secondary | ICD-10-CM | POA: Diagnosis not present

## 2022-04-30 DIAGNOSIS — Z8669 Personal history of other diseases of the nervous system and sense organs: Secondary | ICD-10-CM | POA: Diagnosis not present

## 2022-04-30 DIAGNOSIS — D472 Monoclonal gammopathy: Secondary | ICD-10-CM | POA: Diagnosis not present

## 2022-04-30 DIAGNOSIS — G610 Guillain-Barre syndrome: Secondary | ICD-10-CM | POA: Diagnosis not present

## 2022-05-04 ENCOUNTER — Ambulatory Visit (INDEPENDENT_AMBULATORY_CARE_PROVIDER_SITE_OTHER): Payer: Medicare HMO

## 2022-05-04 DIAGNOSIS — M810 Age-related osteoporosis without current pathological fracture: Secondary | ICD-10-CM

## 2022-05-04 MED ORDER — DENOSUMAB 60 MG/ML ~~LOC~~ SOSY
60.0000 mg | PREFILLED_SYRINGE | Freq: Once | SUBCUTANEOUS | Status: AC
  Administered 2022-05-04: 60 mg via SUBCUTANEOUS

## 2022-05-04 NOTE — Progress Notes (Signed)
Patient verbally confirmed name, date of birth, and correct medication to be administered. Prolia injection administered and pt tolerated well.

## 2022-05-05 DIAGNOSIS — M6281 Muscle weakness (generalized): Secondary | ICD-10-CM | POA: Diagnosis not present

## 2022-05-07 DIAGNOSIS — M6281 Muscle weakness (generalized): Secondary | ICD-10-CM | POA: Diagnosis not present

## 2022-05-11 DIAGNOSIS — M6281 Muscle weakness (generalized): Secondary | ICD-10-CM | POA: Diagnosis not present

## 2022-05-13 DIAGNOSIS — M6281 Muscle weakness (generalized): Secondary | ICD-10-CM | POA: Diagnosis not present

## 2022-05-20 NOTE — Telephone Encounter (Signed)
Last Prolia inj 05/04/22 Next Prolia inj due 11/05/22

## 2022-06-11 DIAGNOSIS — M25551 Pain in right hip: Secondary | ICD-10-CM | POA: Diagnosis not present

## 2022-07-05 DIAGNOSIS — M21371 Foot drop, right foot: Secondary | ICD-10-CM | POA: Diagnosis not present

## 2022-07-05 DIAGNOSIS — M21372 Foot drop, left foot: Secondary | ICD-10-CM | POA: Diagnosis not present

## 2022-07-30 DIAGNOSIS — M545 Low back pain, unspecified: Secondary | ICD-10-CM | POA: Diagnosis not present

## 2022-07-30 DIAGNOSIS — M5136 Other intervertebral disc degeneration, lumbar region: Secondary | ICD-10-CM | POA: Diagnosis not present

## 2022-07-30 DIAGNOSIS — M25551 Pain in right hip: Secondary | ICD-10-CM | POA: Diagnosis not present

## 2022-07-30 DIAGNOSIS — M47896 Other spondylosis, lumbar region: Secondary | ICD-10-CM | POA: Diagnosis not present

## 2022-08-09 DIAGNOSIS — M545 Low back pain, unspecified: Secondary | ICD-10-CM | POA: Diagnosis not present

## 2022-08-09 DIAGNOSIS — M79604 Pain in right leg: Secondary | ICD-10-CM | POA: Diagnosis not present

## 2022-08-20 DIAGNOSIS — M5451 Vertebrogenic low back pain: Secondary | ICD-10-CM | POA: Diagnosis not present

## 2022-08-27 DIAGNOSIS — N401 Enlarged prostate with lower urinary tract symptoms: Secondary | ICD-10-CM | POA: Diagnosis not present

## 2022-08-27 DIAGNOSIS — N312 Flaccid neuropathic bladder, not elsewhere classified: Secondary | ICD-10-CM | POA: Diagnosis not present

## 2022-08-27 DIAGNOSIS — N3942 Incontinence without sensory awareness: Secondary | ICD-10-CM | POA: Diagnosis not present

## 2022-08-30 DIAGNOSIS — M479 Spondylosis, unspecified: Secondary | ICD-10-CM | POA: Diagnosis not present

## 2022-08-30 DIAGNOSIS — M47816 Spondylosis without myelopathy or radiculopathy, lumbar region: Secondary | ICD-10-CM | POA: Diagnosis not present

## 2022-09-14 DIAGNOSIS — S6992XA Unspecified injury of left wrist, hand and finger(s), initial encounter: Secondary | ICD-10-CM | POA: Diagnosis not present

## 2022-09-14 DIAGNOSIS — M79645 Pain in left finger(s): Secondary | ICD-10-CM | POA: Diagnosis not present

## 2022-09-14 DIAGNOSIS — S62633A Displaced fracture of distal phalanx of left middle finger, initial encounter for closed fracture: Secondary | ICD-10-CM | POA: Diagnosis not present

## 2022-09-14 DIAGNOSIS — W010XXA Fall on same level from slipping, tripping and stumbling without subsequent striking against object, initial encounter: Secondary | ICD-10-CM | POA: Diagnosis not present

## 2022-09-14 DIAGNOSIS — Z7982 Long term (current) use of aspirin: Secondary | ICD-10-CM | POA: Diagnosis not present

## 2022-09-14 DIAGNOSIS — S62663B Nondisplaced fracture of distal phalanx of left middle finger, initial encounter for open fracture: Secondary | ICD-10-CM | POA: Diagnosis not present

## 2022-09-14 DIAGNOSIS — S61213A Laceration without foreign body of left middle finger without damage to nail, initial encounter: Secondary | ICD-10-CM | POA: Diagnosis not present

## 2022-09-14 DIAGNOSIS — Z955 Presence of coronary angioplasty implant and graft: Secondary | ICD-10-CM | POA: Diagnosis not present

## 2022-09-14 DIAGNOSIS — Z23 Encounter for immunization: Secondary | ICD-10-CM | POA: Diagnosis not present

## 2022-09-14 DIAGNOSIS — I2510 Atherosclerotic heart disease of native coronary artery without angina pectoris: Secondary | ICD-10-CM | POA: Diagnosis not present

## 2022-09-14 DIAGNOSIS — S62623A Displaced fracture of medial phalanx of left middle finger, initial encounter for closed fracture: Secondary | ICD-10-CM | POA: Diagnosis not present

## 2022-09-14 DIAGNOSIS — S62631A Displaced fracture of distal phalanx of left index finger, initial encounter for closed fracture: Secondary | ICD-10-CM | POA: Diagnosis not present

## 2022-09-16 DIAGNOSIS — M25642 Stiffness of left hand, not elsewhere classified: Secondary | ICD-10-CM | POA: Diagnosis not present

## 2022-09-16 DIAGNOSIS — S62525B Nondisplaced fracture of distal phalanx of left thumb, initial encounter for open fracture: Secondary | ICD-10-CM | POA: Diagnosis not present

## 2022-09-24 DIAGNOSIS — S62525B Nondisplaced fracture of distal phalanx of left thumb, initial encounter for open fracture: Secondary | ICD-10-CM | POA: Diagnosis not present

## 2022-09-28 NOTE — Telephone Encounter (Signed)
Prolia VOB initiated via [email or web address]  Last OV: 10/20/21 Next OV:  Last Prolia inj 05/04/22 Next Prolia inj due 11/05/22

## 2022-09-29 DIAGNOSIS — M5416 Radiculopathy, lumbar region: Secondary | ICD-10-CM | POA: Diagnosis not present

## 2022-10-12 DIAGNOSIS — M5416 Radiculopathy, lumbar region: Secondary | ICD-10-CM | POA: Diagnosis not present

## 2022-10-12 DIAGNOSIS — M47896 Other spondylosis, lumbar region: Secondary | ICD-10-CM | POA: Diagnosis not present

## 2022-10-15 DIAGNOSIS — S62525B Nondisplaced fracture of distal phalanx of left thumb, initial encounter for open fracture: Secondary | ICD-10-CM | POA: Diagnosis not present

## 2022-10-18 DIAGNOSIS — D472 Monoclonal gammopathy: Secondary | ICD-10-CM | POA: Diagnosis not present

## 2022-10-18 NOTE — Telephone Encounter (Signed)
Pt ready for scheduling on or after 11/05/22  Out-of-pocket cost due at time of visit: $327  Primary: Medicare Prolia co-insurance: 20% (approximately $302) Admin fee co-insurance: 20% (approximately $25)  Deductible: does not apply  Prior Auth: APPROVED PA# [id number] Valid: 04/07/22-04/07/23   Secondary: n/a Prolia co-insurance:  Admin fee co-insurance:   Deductible: n/a    ** This summary of benefits is an estimation of the patient's out-of-pocket cost. Exact cost may vary based on individual plan coverage.

## 2022-10-21 ENCOUNTER — Ambulatory Visit: Payer: Medicare HMO | Admitting: Internal Medicine

## 2022-10-21 ENCOUNTER — Encounter: Payer: Self-pay | Admitting: Internal Medicine

## 2022-10-21 VITALS — BP 110/68 | HR 60 | Ht 73.0 in | Wt 192.0 lb

## 2022-10-21 DIAGNOSIS — E538 Deficiency of other specified B group vitamins: Secondary | ICD-10-CM | POA: Diagnosis not present

## 2022-10-21 DIAGNOSIS — M810 Age-related osteoporosis without current pathological fracture: Secondary | ICD-10-CM

## 2022-10-21 DIAGNOSIS — E291 Testicular hypofunction: Secondary | ICD-10-CM | POA: Diagnosis not present

## 2022-10-21 LAB — VITAMIN B12: Vitamin B-12: 752 pg/mL (ref 211–911)

## 2022-10-21 LAB — VITAMIN D 25 HYDROXY (VIT D DEFICIENCY, FRACTURES): VITD: 61.14 ng/mL (ref 30.00–100.00)

## 2022-10-21 NOTE — Progress Notes (Signed)
Subjective:     Patient ID: Brandon Ray, male   DOB: [birth year], 88 y.o.   MRN: [id number]  HPI Brandon Ray is a very pleasant 88 y.o. man with h/o severe polyneuropathy and monoclonal gammopathy, returning for f/u for primary hypogonadism, osteoporosis + h/o sacral fracture, low B12. Last visit 1 year ago.   He is here with his wife who helps him walk.  Interim history: He had 3 falls but no fragility fractures since last visit.  He did have an open fracture of the distal phalanx of the left mid finger on 09/14/2022 when visiting his son. He continues to have disequilibrium.  No orthostasis. He has pain in R hip and leg. MRI showed pinched nerve in the lower back - had a steroid injection >> pain resolved. He he is currently exercising but will start PT.  Reviewed  history: He continues to have severe neuropathy. He also still has discomfort in the mid thoracic region, pelvic region and his hands.  He was previously investigated for POEMS sd. - prev. Seen by Dr Lamonte Sakai, then had an evaluation by Dr. Karie Kirks. Tuchman (Heme/Onc at [facility]) for this, but it was concluded that he actually has MGUS and will be just followed for it. He also had a fat pad biopsy to rule out amyloidosis and this was negative.   Please see my previous notes regarding patient's past medical history. He does have polyneuropathy, monoclonal gammopathy, gynecomastia and primary hypogonadism, but no organomegaly or significant skin changes. He also sees Dr. Tessa Lerner in pain clinic,  Dr Trinna Post (neurology) at the [facility]. Dr. Rushie Goltz is his local neurologist ([facility]). 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   He was on Neurontin high doses but this was not helping >> sees Pain Clinic at [facility] >> tried Cymbalta + Neurontin >> did not help >> then Lyrica, which helps some.  Primary hypogonadism: -We initially started him on testosterone gel 5 g daily, then we increased to 7.5 g daily.  He could not  tolerate this dose due to increased libido, pelvic tension, breast tension.   -We then decrease the dose to 5 g 5 out of 7 days, however, he did stop this in 06/2019.  Due to weakness, he was interested in restarting it - approved by his insurance. -he restarted the testosterone but then stopped in 06/2020 around the time of his skin surgery but again he was interested in restarting it.  Therefore, we did not check his testosterone level in 2022. -At last visit, we restarted on testosterone gel 5 g 5 out of 7 days >> now off >> lower AP better  Reviewed pertinent labs: Component     Latest Ref Rng & Units 10/28/2021  Testosterone     264 - 916 ng/dL 542  Testosterone Free     6.6 - 18.1 pg/mL 5.2 (L)  Sex Horm Binding Glob, Serum     19.3 - 76.4 nmol/L 78.0 (H)   Component     Latest Ref Rng & Units 10/19/2019  Testosterone, Serum (Total)     ng/dL 24 (L)  % Free Testosterone     % 0.6  Free Testosterone, S     pg/mL 1.4 (L)  Sex Hormone Binding Globulin     nmol/L 100.7 (H)  PSA     0.10 - 4.00 ng/mL 0.10   Component     Latest Ref Rng & Units 10/17/2018  Testosterone, Serum (Total)  ng/dL 477  % Free Testosterone     % 0.8  Free Testosterone, S     pg/mL 38 (L)  Sex Hormone Binding Globulin     nmol/L 79.6 (H)   Component     Latest Ref Rng & Units 10/19/2017  Testosterone, Serum (Total)     ng/dL 674  % Free Testosterone     % 1.1  Free Testosterone, S     pg/mL 74  Sex Hormone Binding Globulin     nmol/L 80.9 (H)   Component     Latest Ref Rng & Units 10/20/2015 10/19/2016  Testosterone     264 - 916 ng/dL 251 862  Sex Horm Binding Glob, Serum     19.3 - 76.4 nmol/L 55 71.5  Testosterone Free     6.6 - 18.1 pg/mL 34.6 (L) 10.3  Testosterone-% Free     1.6 - 2.9 % 1.4 (L)    Reviewed PSA levels: Lab Results  Component Value Date   PSA 0.97 10/28/2021   PSA 0.10 10/19/2019   PSA 0.47 10/19/2017   PSA 0.20 01/31/2017   PSA 5.89 (H) 10/19/2016   PSA  0.30 10/20/2015   PSA 0.35 04/16/2015   PSA 0.48 03/13/2014   PSA 0.15 02/09/2013   Reviewed his recent hemoglobin/hematocrit: 10/18/2022: Hemoglobin 13.7 - 17.3 g/dL 14.5  Hematocrit 39.0 - 49.0 % 43.2   10/15/2021: Hemoglobin 13.7 - 17.3 g/dL 16.0   Hematocrit 39.0 - 49.0 % 48.4    Lab Results  Component Value Date   WBC 4.2 01/07/2021   HGB 15.3 01/07/2021   HCT 46.2 01/07/2021   MCV 96.9 01/07/2021   PLT 193 01/07/2021  10/12/2017: 16/47.80 10/15/2015: Hb 15.8/HT 47.8 04/15/2015: Hb 16.4 (13.7-17.3)/HT 50.2 (39-49)  He sees his urologist on a regular basis and has DRE's every year >> normal reportedly.  She also has a history of gynecomastia, with normal mammograms.  This is likely secondary to his primary hypogonadism.  It has not improved after starting testosterone.  Prolactin level was normal.He also has dysesthesia at the level of his nipples, probably related to his neuropathy.  This improved on Lyrica.  She has a history of being on high-dose steroids for his neuropathy-initially started at 60 mg in 07/1999>> stopped prednisone 09/2013.  Patient with clinical osteoporosis: Osteopenic BMD, however, with a sacral fracture likely secondary to steroid treatment, age, and hypogonadism.    He had a fall in the park in 12/2020 >> L fibular fracture - now healed.  Review latest bone density ([facility]) reports-T-scores improved: 11/04/2021  Lumbar spine L1-L4 (L2, L3) Femoral neck (FN) 33% distal radius  T-score  +2.1 RFN: -0.8 LFN: -0.2 n/a  Change in BMD from previous DXA test (%)  +8.6%* +2.2% n/a  (*) statistically significant  DXA 07/02/2019: L1-L4 T score of +1.1 (+6.7%*) and right femoral neck -1.1, left femoral neck -0.8 (mean femoral neck: +1.9%)  DXA 11/28/2016: L1-L4 T score of +0.4 (+3.3%*) and right femoral neck -1.3, left femoral neck -0.8 (mean femoral neck: +3.0%)  DXA 11/10/2014: L1-L4 T score of +0.1 and right femoral neck -1.4, left femoral neck -1.2 DXA  11/06/2012: L1-L4 T score of -0.9 and dual femur T score of -1.1  We started Prolia on 01/07/2015.  Subsequent doses: 07/10/2015 01/28/2016 08/03/2016 01/31/2017 08/04/2017 03/02/2018 09/05/2018 03/15/2019 09/18/2019 03/25/2020 10/17/2020 04/24/2021 10/28/2021 05/04/2022  Kidney function and calcium level normal: 10/18/2022: Urea Nitrogen (BUN) 7 - 20 mg/dL 22 High   Creatinine 0.6 -  1.3 mg/dL 0.9  Calcium 8.7 - 10.2 mg/dL 8.8   Latest vitamin D levels were normal except for a slightly low level at last visit: Lab Results  Component Value Date   VD25OH 60.84 10/28/2021   VD25OH 28.6 (L) 10/17/2020   VD25OH 67.06 10/19/2019   VD25OH 91.84 10/17/2018   VD25OH 62.54 10/19/2017   VD25OH 58.28 10/19/2016   VD25OH 54.39 10/20/2015   VD25OH 64.27 04/16/2015   VD25OH 56.34 10/04/2014   VD25OH 67 09/14/2013   On 5000 units vitamin D daily. He also takes 400 units vitamin D-600 mg calcium.  He has a history of low B12, but this normalized: Lab Results  Component Value Date   VITAMINB12 640 10/28/2021   VITAMINB12 933 (H) 10/17/2020   VITAMINB12 >1500 (H) 10/19/2019   VITAMINB12 1,251 (H) 10/17/2018   VITAMINB12 1,309 (H) 10/19/2017   VITAMINB12 >1500 (H) 01/31/2017   VITAMINB12 225 10/19/2016  At last visit, he was on B12 p.o. 5000 mcg every other day, but since the B12 level was still high, I advised him to only take this twice a week. He continues on phenobarbital since 1961.   Review of Systems + see HPI + fatigue Musculoskeletal: + muscle aches/no joint aches Neurological: no tremors/+ numbness/+ tingling/no dizziness  I reviewed pt's medications, allergies, PMH, social hx, family hx, and changes were documented in the history of present illness. Otherwise, unchanged from my initial visit note.  Past Medical History:  Diagnosis Date   Arthritis    BPH (benign prostatic hypertrophy) 2014   Coronary artery disease 01/2003   CABG   Free monoclonal light chain    urine    History of blood transfusion 2004   Hyperlipidemia    on medication   Macular degeneration    both eyes   Peripheral neuropathy 05/2011    follow up with Dr. Tish Frederickson multiple issues   Pleural effusion 05/2011   from abdominal surgery   Seizures ([place]) 1950's   last one 1961   Small bowel obstruction ([place]) 05/2011   due to adhesion   Weakness 2013-May began   lower  from neuropathy from knees down   Past Surgical History:  Procedure Laterality Date   ABDOMINAL SURGERY  05/2011   bowel obstruction   ANKLE FRACTURE SURGERY  1997   left   CARDIAC CATHETERIZATION  01/27/2003   NORMAL LEFT VENTRICULAR SIZE WITH MILD FOCAL LATERAL WALL HYPOKINESIA. EF 60%   CORONARY ARTERY BYPASS GRAFT  2004   LIMA GRAFT TO THE LAD, SAPHENOUS VEIN GRAFT TO THE DIAGONAL, SAPHENOUS VEIN GRAFT TO THE FIRST OM, SAPHENOUS VEIN GRAFT TO THE PDA   HAND SURGERY  2000   left   TOTAL KNEE ARTHROPLASTY Right 01/03/2018   Procedure: RIGHT TOTAL KNEE ARTHROPLASTY;  Surgeon: Paralee Cancel, MD;  Location: [facility];  Service: Orthopedics;  Laterality: Right;   TRANSURETHRAL RESECTION OF PROSTATE  1992   TRANSURETHRAL RESECTION OF PROSTATE N/A 12/22/2012   Procedure: TRANSURETHRAL RESECTION OF THE PROSTATE WITH GYRUS (TURP);  Surgeon: Fredricka Bonine, MD;  Location: [facility];  Service: Urology;  Laterality: N/A;   Social History   Socioeconomic History   Marital status: Married    Spouse name: Not on file   Number of children: 4   Years of education: Not on file   Highest education level: Not on file  Occupational History   Occupation: Freight forwarder    Comment: retired, Manufacturing systems engineer firm.   Tobacco Use   Smoking  status: Former    Packs/day: 1.00    Years: 5.00    Total pack years: 5.00    Types: Cigarettes    Quit date: 07/01/1965    Years since quitting: 57.3   Smokeless tobacco: Never  Vaping Use   Vaping Use: Never used  Substance and Sexual Activity   Alcohol use: No   Drug use:  No   Sexual activity: Not on file  Other Topics Concern   Not on file  Social History Narrative   Not on file   Social Determinants of Health   Financial Resource Strain: Not on file  Food Insecurity: Not on file  Transportation Needs: Not on file  Physical Activity: Not on file  Stress: Not on file  Social Connections: Not on file  Intimate Partner Violence: Not on file   Current Outpatient Medications on File Prior to Visit  Medication Sig Dispense Refill   aspirin 81 MG EC tablet Take by mouth.     atorvastatin (LIPITOR) 10 MG tablet TAKE 1/2 TABLET ( 5 MG ) DAILY 45 tablet 3   calcium-vitamin D (OSCAL WITH D) 500-200 MG-UNIT per tablet Take 1 tablet by mouth daily at 2 PM.     Cholecalciferol (VITAMIN D-3) 5000 units TABS Take 5,000 Units by mouth every evening. Take 5000 IU 5 x week     Cyanocobalamin (B-12) 2500 MCG TABS Take by mouth.     Omega-3 Fatty Acids (FISH OIL) 1200 MG CAPS Take 1,200 mg by mouth at bedtime.      PHENobarbital (LUMINAL) 97.2 MG tablet Take 97.2 mg by mouth at bedtime.     polyethylene glycol (MIRALAX / GLYCOLAX) 17 g packet Take 17 g by mouth daily. Pt uses as neded.     pregabalin (LYRICA) 150 MG capsule Take 150 mg by mouth 2 (two) times daily.     tamsulosin (FLOMAX) 0.4 MG CAPS capsule Take 0.4 mg by mouth at bedtime.      testosterone (ANDROGEL) 50 MG/5GM (1%) GEL Apply 1 package on skin 5/7 days 300 g 3   No current facility-administered medications on file prior to visit.   Allergies  Allergen Reactions   Amoxicillin Anaphylaxis and Rash    Upper torso only. Has patient had a PCN reaction causing immediate rash, facial/tongue/throat swelling, SOB or lightheadedness with hypotension: No Has patient had a PCN reaction causing severe rash involving mucus membranes or skin necrosis: No Has patient had a PCN reaction that required hospitalization: No Has patient had a PCN reaction occurring within the last 10 years: Yes If all of the above  answers are "NO", then may proceed with Cephalosporin use.    Tetanus Toxoids Other (See Comments)    TDP vaccine- family stated this caused neuropathy.   Boostrix [Tetanus-Diphth-Acell Pertussis] Other (See Comments)    Neuropathy symptoms   Diphth-Acell Pertussis-Tetanus    Other Other (See Comments)    Flu shot-neuropathy symptoms   Family History  Problem Relation Age of Onset   Asthma Mother    Heart disease Mother 48   Coronary artery disease Father    Heart disease Father    Cancer Sister 15       breast    Objective:   Physical Exam BP 110/68 (BP Location: Left Arm, Patient Position: Sitting, Cuff Size: Small)   Pulse 60   Ht $R'6\' 1"'zm$  (1.854 m)   Wt 192 lb (87.1 kg)   SpO2 97%   BMI 25.33 kg/m   Wt  Readings from Last 3 Encounters:  10/21/22 192 lb (87.1 kg)  10/30/21 192 lb (87.1 kg)  10/20/21 192 lb 3.2 oz (87.2 kg)   Constitutional: overweight, in NAD, walks with a walker Eyes:  EOMI, no exophthalmos ENT: no neck masses, no cervical lymphadenopathy Cardiovascular: RRR, No MRG Respiratory: CTA B Musculoskeletal: no deformities Skin:no rashes Neurological: no tremor with outstretched hands  Assessment:     1. Primary hypogonadism - + gynecomastia bilaterally (per mammogram) + testicular atrophy - He is on testosterone gel >> a sensation of pressure in abdomen, but this did not subside after stopping testosterone gel for 1 mo after his surgery for Transurethral resection of prostate residual growth in 12/22/2012.   2. Osteoporosis - h/o sacral fracture, right big toe fracture (04/2013), spine X-ray: no vb fx - Off steroids now - on adequate Ca+vit D - On Prolia, no side effects  3.  Low vitamin B12  4. Polyneuropathy - unlikely POEMS syndrome per evaluation by Hem/Onc at [facility] - amyloidosis ruled out by negative fat pad biopsy - managed by Dr. Trinna Post at [facility] and [facility]. Doree Albee at [facility]   5. Long-term use of steroids - previously on  prednisone 60 mg starting at the end of 2013 - Was able to taper >> now off since 09/2013  6.  MGUS - monoclonal gammopathy - now managed by Dr.Tuchman at [facility], previously Dr. Lamonte Sakai  Plan:     1. Primary hypogonadism -He is back on AndroGel 1% 5 g 5 to 7 days.  He was missing doses in the past when his grandchildren were visiting, to avoid transfer.  He never felt a significant benefit from the testosterone and even had pelvic pressure (but it was not clear whether this was related to testosterone treatment since he held testosterone in the past and was still experiencing the symptoms).  He continues to see Dr. Junious Silk with urology and he is up-to-date with DRE's.  At last visit, testosterone level was slightly low, but not enough to necessarily trigger an increase in dose.   -However, afterwards, he felt weak off testosterone so we restarted it at last visit: 5 g 5 to 7 days. -at today's visit, he mentions that he stopped testosterone and would not be interested in restarting it.  His lower abdominal pain has improved. -CBC was recently normal per review of records from [facility].  We will not recheck his testosterone and PSA today.  2. Osteoporosis  -He has a history of osteopenia on DXA scans but he does have a history of sacral fracture, giving him a diagnosis of osteoporosis. -He has been on prednisone and Dilantin in the past (now off) and also on testosterone replacement -He had a fibular fracture after our last visit.  We discussed that only 1 fracture does not necessarily imply Prolia failure.  Since last visit, he had a finger fracture, which we discussed is not considered a fragility fracture.  However, he describes 3 falls and he did not have fragility fractures with these.  He is using a walker, but at the time of the falls, he was using a cane. -He is bone density report from 07/02/2019 showed a stable BMD at the hips and improvement at the spine.  Latest bone density scan from a year ago  showed statistical significant improvement at the level of the spine and nonsignificant improvement at the femoral necks. -He continues on Prolia, which we started in 2016.  He is not missing injections.  He  has them every 6 months.  No side effects from Prolia: No jaw pain.  He had/hip pain but it was determined that this was due to a pinched nerve in his lower back.  He had a steroid injection and the pain resolved.  -The latest studies showed continuous improvement even beyond 10 years so we will plan to continue Prolia for now -At last visit he was on 5000 units vitamin D 5 out of 7 days, but vitamin D was slightly low so we increased the dose to 5000 units daily.  He continues on this dose. -We will repeat his vitamin D level today -He continues calcium 600 mg daily -I will see him back in 1 year  3.  Low vitamin B12 -He is on 5000 mcg B12 twice a week. -At last check, B12 level was normal -Will recheck the B12 level today  CC: Drs. Earle Gell, [place]  Component     Latest Ref Rng 10/21/2022  VITD     30.00 - 100.00 ng/mL 61.14   Vitamin B12     211 - 911 pg/mL 752   Both tests are normal.  Philemon Kingdom, MD PhD [facility] Endocrinology

## 2022-10-21 NOTE — Patient Instructions (Addendum)
Please stop at the lab.  Continue the same B12 and D vitamin supplements.  Continue Prolia.  Please come back for a follow-up appointment in 1 year.

## 2022-10-27 ENCOUNTER — Encounter (INDEPENDENT_AMBULATORY_CARE_PROVIDER_SITE_OTHER): Payer: Medicare HMO | Admitting: Ophthalmology

## 2022-10-27 DIAGNOSIS — H353132 Nonexudative age-related macular degeneration, bilateral, intermediate dry stage: Secondary | ICD-10-CM | POA: Diagnosis not present

## 2022-10-27 DIAGNOSIS — H43813 Vitreous degeneration, bilateral: Secondary | ICD-10-CM | POA: Diagnosis not present

## 2022-10-27 DIAGNOSIS — H353221 Exudative age-related macular degeneration, left eye, with active choroidal neovascularization: Secondary | ICD-10-CM | POA: Diagnosis not present

## 2022-10-27 DIAGNOSIS — H43393 Other vitreous opacities, bilateral: Secondary | ICD-10-CM | POA: Diagnosis not present

## 2022-11-01 DIAGNOSIS — H353221 Exudative age-related macular degeneration, left eye, with active choroidal neovascularization: Secondary | ICD-10-CM | POA: Diagnosis not present

## 2022-11-03 ENCOUNTER — Ambulatory Visit: Payer: Medicare HMO | Admitting: Internal Medicine

## 2022-11-11 ENCOUNTER — Ambulatory Visit: Payer: Medicare HMO | Admitting: Internal Medicine

## 2022-11-23 ENCOUNTER — Other Ambulatory Visit: Payer: Self-pay | Admitting: Gastroenterology

## 2022-11-23 ENCOUNTER — Ambulatory Visit
Admission: RE | Admit: 2022-11-23 | Discharge: 2022-11-23 | Disposition: A | Discharge status: Home / Self Care | Payer: Medicare HMO | Source: Ambulatory Visit | Attending: Gastroenterology | Admitting: Gastroenterology

## 2022-11-23 DIAGNOSIS — K5909 Other constipation: Secondary | ICD-10-CM | POA: Diagnosis not present

## 2022-11-23 DIAGNOSIS — K5900 Constipation, unspecified: Secondary | ICD-10-CM | POA: Diagnosis not present

## 2022-12-06 ENCOUNTER — Encounter: Payer: Self-pay | Admitting: Internal Medicine

## 2022-12-06 DIAGNOSIS — M47896 Other spondylosis, lumbar region: Secondary | ICD-10-CM | POA: Diagnosis not present

## 2022-12-06 DIAGNOSIS — H353221 Exudative age-related macular degeneration, left eye, with active choroidal neovascularization: Secondary | ICD-10-CM | POA: Diagnosis not present

## 2022-12-06 DIAGNOSIS — H43393 Other vitreous opacities, bilateral: Secondary | ICD-10-CM | POA: Diagnosis not present

## 2022-12-06 DIAGNOSIS — H43813 Vitreous degeneration, bilateral: Secondary | ICD-10-CM | POA: Diagnosis not present

## 2022-12-06 DIAGNOSIS — M5451 Vertebrogenic low back pain: Secondary | ICD-10-CM | POA: Diagnosis not present

## 2022-12-06 DIAGNOSIS — H353132 Nonexudative age-related macular degeneration, bilateral, intermediate dry stage: Secondary | ICD-10-CM | POA: Diagnosis not present

## 2022-12-09 DIAGNOSIS — M47896 Other spondylosis, lumbar region: Secondary | ICD-10-CM | POA: Diagnosis not present

## 2022-12-09 DIAGNOSIS — M5451 Vertebrogenic low back pain: Secondary | ICD-10-CM | POA: Diagnosis not present

## 2022-12-14 ENCOUNTER — Encounter: Payer: Self-pay | Admitting: Internal Medicine

## 2022-12-14 ENCOUNTER — Ambulatory Visit: Payer: Medicare HMO

## 2022-12-14 DIAGNOSIS — M47896 Other spondylosis, lumbar region: Secondary | ICD-10-CM | POA: Diagnosis not present

## 2022-12-14 DIAGNOSIS — M5451 Vertebrogenic low back pain: Secondary | ICD-10-CM | POA: Diagnosis not present

## 2022-12-16 DIAGNOSIS — M5451 Vertebrogenic low back pain: Secondary | ICD-10-CM | POA: Diagnosis not present

## 2022-12-16 DIAGNOSIS — M47896 Other spondylosis, lumbar region: Secondary | ICD-10-CM | POA: Diagnosis not present

## 2022-12-20 DIAGNOSIS — M5451 Vertebrogenic low back pain: Secondary | ICD-10-CM | POA: Diagnosis not present

## 2022-12-20 DIAGNOSIS — M47896 Other spondylosis, lumbar region: Secondary | ICD-10-CM | POA: Diagnosis not present

## 2022-12-21 NOTE — Progress Notes (Signed)
Cardiology Office Note:    Date:  12/24/2022   ID:  SENCERE VANNAME, DOB 1935-07-30, MRN [id number]  PCP:  Carlus Pavlov, MD   [facility] Providers Cardiologist:  Peter Swaziland, MD     Referring MD: Carlus Pavlov, MD   Chief Complaint: 12 mo follow-up CAD  History of Present Illness:    Brandon Ray is a 88 y.o. male with a hx of CAD, MI, small bowel obstruction, hyperlipidemia, and polyneuropathy.   History of CAD s/p CABG 2004 following NSTEMI with LIMA to LAD, SVG to diagonal, SVG to OM1, SVG to PDA.  He had severe three-vessel disease with normal LV function.   He was last seen in our office by Dr. Swaziland on 08/20/2020 at which time no changes were made and one year follow-up was recommended.   Seen in cardiology clinic by me on 10/30/21 for follow-up. He was accompanied by his wife. Recovering from a left fibula fracture that occurred in the Fall of 2022 and limited walking for 5 weeks. Prior to injury, he was walking 1 1/2 miles daily. Now, walking on treadmill 3 days per week for 26-27 min at a slow pace. No chest pain, shortness of breath, fatigue, palpitations, melena, hematuria, hemoptysis, diaphoresis, weakness, presyncope, syncope, orthopnea, and PND.  Has mild bilateral lower extremity edema that has been present for many years, wears braces on both lower extremities for peripheral neuropathy and weakness. Elevates his legs on a consistent basis. Has an agreement with Dr. Swaziland to take atorvastatin 10 mg every other day due to history of intolerance of higher doses of statin. He is followed at [facility] for MGUS and sees Dr. Elvera Lennox for primary male hypogonadism, osteoporosis, and low serum vitamin B12.  BP is at goal, he is not on antihypertensives. No specific concerns today. One year follow-up was recommended.   Today, he is here for annual follow-up and is accompanied by his wife. Has been less active over the past year due to leg discomfort and decreased strength.  Had an injection in his back due to pinched nerve which has improved pain now working on recovering strength with PT. Walking with a walker whereas previously has used a cane.  Has mild dyspnea on exertion that he feels is likely secondary to deconditioning. Is able to complete work out with PT without significant symptoms. He denies chest pain, lower extremity edema, fatigue, palpitations, melena, hematuria, hemoptysis, diaphoresis, weakness, presyncope, syncope, orthopnea, and PND.   Past Medical History:  Diagnosis Date   Arthritis    BPH (benign prostatic hypertrophy) 2014   Coronary artery disease 01/2003   CABG   Free monoclonal light chain    urine   History of blood transfusion 2004   Hyperlipidemia    on medication   Macular degeneration    both eyes   Peripheral neuropathy 05/2011    follow up with Dr. Hart Carwin multiple issues   Pleural effusion 05/2011   from abdominal surgery   Seizures 1950's   last one 1961   Small bowel obstruction 05/2011   due to adhesion   Weakness 2013-May began   lower  from neuropathy from knees down    Past Surgical History:  Procedure Laterality Date   ABDOMINAL SURGERY  05/2011   bowel obstruction   ANKLE FRACTURE SURGERY  1997   left   CARDIAC CATHETERIZATION  01/27/2003   NORMAL LEFT VENTRICULAR SIZE WITH MILD FOCAL LATERAL WALL HYPOKINESIA. EF 60%   CORONARY ARTERY BYPASS GRAFT  2004   LIMA GRAFT TO THE LAD, SAPHENOUS VEIN GRAFT TO THE DIAGONAL, SAPHENOUS VEIN GRAFT TO THE FIRST OM, SAPHENOUS VEIN GRAFT TO THE PDA   HAND SURGERY  2000   left   TOTAL KNEE ARTHROPLASTY Right 01/03/2018   Procedure: RIGHT TOTAL KNEE ARTHROPLASTY;  Surgeon: Durene Romans, MD;  Location: [facility];  Service: Orthopedics;  Laterality: Right;   TRANSURETHRAL RESECTION OF PROSTATE  1992   TRANSURETHRAL RESECTION OF PROSTATE N/A 12/22/2012   Procedure: TRANSURETHRAL RESECTION OF THE PROSTATE WITH GYRUS (TURP);  Surgeon: Antony Haste, MD;  Location: [facility];  Service: Urology;  Laterality: N/A;    Current Medications: Current Meds  Medication Sig   aspirin 81 MG EC tablet Take by mouth.   calcium-vitamin D (OSCAL WITH D) 500-200 MG-UNIT per tablet Take 1 tablet by mouth daily at 2 PM.   Cholecalciferol (VITAMIN D-3) 5000 units TABS Take 5,000 Units by mouth every evening. Take 5000 IU 5 x week   Cyanocobalamin (B-12) 2500 MCG TABS Take by mouth.   Omega-3 Fatty Acids (FISH OIL) 1200 MG CAPS Take 1,200 mg by mouth at bedtime.    PHENobarbital (LUMINAL) 97.2 MG tablet Take 97.2 mg by mouth at bedtime.   polyethylene glycol (MIRALAX / GLYCOLAX) 17 g packet Take 17 g by mouth daily. Pt uses as neded.   pregabalin (LYRICA) 150 MG capsule Take 150 mg by mouth 2 (two) times daily.   tamsulosin (FLOMAX) 0.4 MG CAPS capsule Take 0.4 mg by mouth at bedtime.    TRULANCE 3 MG TABS    [DISCONTINUED] atorvastatin (LIPITOR) 10 MG tablet TAKE 1/2 TABLET ( 5 MG ) DAILY (Patient taking differently: Take 5 mg by mouth every other day. Take 1/2 tablet ( 5 mg ) daily)   [DISCONTINUED] atorvastatin (LIPITOR) 10 MG tablet Take 10 mg by mouth as directed. Take one (1) tablet by mouth ( 10 mg ) 5 times weekly.     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   Tobacco Use   Smoking status: Former    Packs/day: 1.00    Years: 5.00    Additional pack years: 0.00    Total pack years: 5.00    Types: Cigarettes    Quit date: 07/01/1965    Years since quitting: 57.5   Smokeless tobacco: Never  Vaping Use   Vaping Use: Never used  Substance and Sexual Activity   Alcohol use: No    Drug use: No   Sexual activity: Not on file  Other Topics Concern   Not on file  Social History Narrative   Not on file   Social Determinants of Health   Financial Resource Strain: Not on file  Food Insecurity: Not on file  Transportation Needs: Not on file  Physical Activity: Not on file  Stress: Not on file  Social Connections: Not on file     Family History: The patient's family history includes Asthma in his mother; Cancer (age of onset: 42) in his sister; Coronary artery disease in his father; Heart disease in his father; Heart disease (age of onset: 95)  in his mother.  ROS:   Please see the history of present illness.   + dyspnea on exertion All other systems reviewed and are negative.  Labs/Other Studies Reviewed:    The following studies were reviewed today:  Cardiac cath 2004  Severe 3 vessel obstructive atherosclerotic CAD. Normal left ventricular size with mild focal lateral wall hypokinesia EF 60%  Recent Labs: No results found for requested labs within last 365 days.   Recent Lipid Panel    Component Value Date/Time   CHOL 160 10/30/2021 1008   TRIG 65 10/30/2021 1008   HDL 62 10/30/2021 1008   CHOLHDL 2.6 10/30/2021 1008   CHOLHDL 2.4 09/06/2016 1508   VLDL 15 09/06/2016 1508   LDLCALC 85 10/30/2021 1008     Risk Assessment/Calculations:       Physical Exam:    VS:  BP (!) 110/58   Pulse 70   Ht 6\' 1"  (1.854 m)   Wt 188 lb (85.3 kg)   SpO2 94%   BMI 24.80 kg/m     Wt Readings from Last 3 Encounters:  12/24/22 188 lb (85.3 kg)  10/21/22 192 lb (87.1 kg)  10/30/21 192 lb (87.1 kg)     GEN:  Well nourished, well developed in no acute distress HEENT: Normal NECK: No JVD; No carotid bruits CARDIAC: RRR, no murmurs, rubs, gallops RESPIRATORY:  Clear to auscultation without rales, wheezing or rhonchi  ABDOMEN: Soft, non-tender, non-distended MUSCULOSKELETAL:  Mild bilateral lower extremity edema. Braces on bil lower extremities.  2+  pedal pulses, equal bilaterally. Hx of right foot drop.  SKIN: Warm and dry NEUROLOGIC:  Alert and oriented x 3 PSYCHIATRIC:  Normal affect   EKG:  EKG is ordered today.  The ekg ordered today demonstrates normal sinus rhythm at 70 bpm, LAD, RBBB, no acute change from previous tracing  Diagnoses:    1. Coronary artery disease involving coronary bypass graft of native heart without angina pectoris   2. Hyperlipidemia LDL goal <70   3. Right bundle branch block     Assessment and Plan:     CAD without angina: History of 3 vessel CAD s/p CABG 2004, 20 year anniversary in May. He denies chest pain. Has some DOE recently while working with physical therapy. Admits he has been more sedentary over the last year, has not been able to walk on treadmill. Feels DOE secondary to deconditioning. Lengthy discussion about further testing, he would like to hold off for now. Advised him to notify us if symptoms worsen. Discussion about LDL above goal, see below. Continue aspirin, statin.   Hyperlipidemia LDL goal < 70: LDL 85 on 10/30/21. Will recheck lipid today. Has not tolerated moderate or high dose statin in the past. Willing to increase atorvastatin 10 mg to 5 days per week. Discussed potential non-statin options if LDL remains above goal. Will await results of lipid panel. Consider bempedoic acid or PCSK9i.   Right bundle branch block: Known. He is asymptomatic. Consider echocardiogram, however he would like to hold off for now.      Disposition: 1 year with Dr. Swaziland   Medication Adjustments/Labs and Tests Ordered: Current medicines are reviewed at length with the patient today.  Concerns regarding medicines are outlined above.  Orders Placed This Encounter  Procedures   Lipid Profile   EKG 12-Lead   Meds ordered this encounter  Medications   atorvastatin (LIPITOR) 10 MG tablet    Sig: Take 1 tablet (10 mg total) by mouth as  directed. Take one (1) tablet by mouth ( 10 mg ) 5 times weekly.     Dispense:  90 tablet    Refill:  3    Patient Instructions  Medication Instructions:   INCREASE Atorvastatin one (1) tablet by mouth ( 10 mg) 5 times weekly.  *If you need a refill on your cardiac medications before your next appointment, please call your pharmacy*   Lab Work:  TODAY!!! LIPID  If you have labs (blood work) drawn today and your tests are completely normal, you will receive your results only by: MyChart Message (if you have MyChart) OR A paper copy in the mail If you have any lab test that is abnormal or we need to change your treatment, we will call you to review the results.   Testing/Procedures:  None   Follow-Up: At [facility], you and your health needs are our priority.  As part of our continuing mission to provide you with exceptional heart care, we have created designated Provider Care Teams.  These Care Teams include your primary Cardiologist (physician) and Advanced Practice Providers (APPs -  Physician Assistants and Nurse Practitioners) who all work together to provide you with the care you need, when you need it.  We recommend signing up for the patient portal called "MyChart".  Sign up information is provided on this After Visit Summary.  MyChart is used to connect with patients for Virtual Visits (Telemedicine).  Patients are able to view lab/test results, encounter notes, upcoming appointments, etc.  Non-urgent messages can be sent to your provider as well.   To learn more about what you can do with MyChart, go to [email or web address].    Your next appointment:   1 year(s)  Provider:   Peter Swaziland, MD     Other Instructions  Your physician wants you to follow-up in: 1 year with Dr. Thomasene Lot.  You will receive a reminder letter in the mail two months in advance. If you don't receive a letter, please call our office to schedule the follow-up appointment.     Signed, Levi Aland, NP  12/24/2022 1:07 PM    [facility]

## 2022-12-22 ENCOUNTER — Ambulatory Visit: Payer: Medicare HMO | Admitting: Nurse Practitioner

## 2022-12-22 DIAGNOSIS — M5451 Vertebrogenic low back pain: Secondary | ICD-10-CM | POA: Diagnosis not present

## 2022-12-22 DIAGNOSIS — M47896 Other spondylosis, lumbar region: Secondary | ICD-10-CM | POA: Diagnosis not present

## 2022-12-24 ENCOUNTER — Ambulatory Visit (INDEPENDENT_AMBULATORY_CARE_PROVIDER_SITE_OTHER): Payer: Medicare HMO

## 2022-12-24 ENCOUNTER — Ambulatory Visit: Payer: Medicare HMO | Attending: Nurse Practitioner | Admitting: Nurse Practitioner

## 2022-12-24 ENCOUNTER — Encounter: Payer: Self-pay | Admitting: Nurse Practitioner

## 2022-12-24 VITALS — BP 110/58 | HR 70 | Ht 73.0 in | Wt 188.0 lb

## 2022-12-24 DIAGNOSIS — I4510 Unspecified right bundle-branch block: Secondary | ICD-10-CM | POA: Diagnosis not present

## 2022-12-24 DIAGNOSIS — E785 Hyperlipidemia, unspecified: Secondary | ICD-10-CM | POA: Diagnosis not present

## 2022-12-24 DIAGNOSIS — I25810 Atherosclerosis of coronary artery bypass graft(s) without angina pectoris: Secondary | ICD-10-CM

## 2022-12-24 DIAGNOSIS — M810 Age-related osteoporosis without current pathological fracture: Secondary | ICD-10-CM

## 2022-12-24 MED ORDER — ATORVASTATIN CALCIUM 10 MG PO TABS: 10.0000 mg | ORAL_TABLET | ORAL | 3 refills | Status: DC

## 2022-12-24 MED ORDER — DENOSUMAB 60 MG/ML ~~LOC~~ SOSY
60.0000 mg | PREFILLED_SYRINGE | Freq: Once | SUBCUTANEOUS | Status: AC
  Administered 2022-12-24: 60 mg via SUBCUTANEOUS

## 2022-12-24 NOTE — Patient Instructions (Signed)
Medication Instructions:   INCREASE Atorvastatin one (1) tablet by mouth ( 10 mg) 5 times weekly.  *If you need a refill on your cardiac medications before your next appointment, please call your pharmacy*   Lab Work:  TODAY!!! LIPID  If you have labs (blood work) drawn today and your tests are completely normal, you will receive your results only by: MyChart Message (if you have MyChart) OR A paper copy in the mail If you have any lab test that is abnormal or we need to change your treatment, we will call you to review the results.   Testing/Procedures:  None   Follow-Up: At [facility], you and your health needs are our priority.  As part of our continuing mission to provide you with exceptional heart care, we have created designated Provider Care Teams.  These Care Teams include your primary Cardiologist (physician) and Advanced Practice Providers (APPs -  Physician Assistants and Nurse Practitioners) who all work together to provide you with the care you need, when you need it.  We recommend signing up for the patient portal called "MyChart".  Sign up information is provided on this After Visit Summary.  MyChart is used to connect with patients for Virtual Visits (Telemedicine).  Patients are able to view lab/test results, encounter notes, upcoming appointments, etc.  Non-urgent messages can be sent to your provider as well.   To learn more about what you can do with MyChart, go to [email or web address].    Your next appointment:   1 year(s)  Provider:   Peter Swaziland, MD     Other Instructions  Your physician wants you to follow-up in: 1 year with Dr. Thomasene Lot.  You will receive a reminder letter in the mail two months in advance. If you don't receive a letter, please call our office to schedule the follow-up appointment.

## 2022-12-24 NOTE — Progress Notes (Signed)
After obtaining consent, and per orders of Dr. Elvera Lennox, injection of Prolia given by Regis Bill. Patient instructed to remain in clinic for 20 minutes afterwards, and to report any adverse reaction to me immediately.

## 2022-12-25 LAB — LIPID PANEL
Chol/HDL Ratio: 2.5 ratio (ref 0.0–5.0)
Cholesterol, Total: 188 mg/dL (ref 100–199)
HDL: 76 mg/dL (ref >39)
LDL Chol Calc (NIH): 97 mg/dL (ref 0–99)
Triglycerides: 81 mg/dL (ref 0–149)
VLDL Cholesterol Cal: 15 mg/dL (ref 5–40)

## 2022-12-28 ENCOUNTER — Other Ambulatory Visit: Payer: Self-pay | Admitting: Nurse Practitioner

## 2022-12-28 DIAGNOSIS — M47896 Other spondylosis, lumbar region: Secondary | ICD-10-CM | POA: Diagnosis not present

## 2022-12-28 DIAGNOSIS — M5451 Vertebrogenic low back pain: Secondary | ICD-10-CM | POA: Diagnosis not present

## 2022-12-28 MED ORDER — NEXLIZET 180-10 MG PO TABS: 1.0000 | ORAL_TABLET | Freq: Every day | ORAL | 3 refills | Status: DC

## 2022-12-28 NOTE — Telephone Encounter (Signed)
Last Prolia inj 12/24/22 Next Prolia inj due 06/26/23

## 2022-12-30 DIAGNOSIS — M47896 Other spondylosis, lumbar region: Secondary | ICD-10-CM | POA: Diagnosis not present

## 2022-12-30 DIAGNOSIS — M5451 Vertebrogenic low back pain: Secondary | ICD-10-CM | POA: Diagnosis not present

## 2023-01-05 DIAGNOSIS — H353221 Exudative age-related macular degeneration, left eye, with active choroidal neovascularization: Secondary | ICD-10-CM | POA: Diagnosis not present

## 2023-01-05 DIAGNOSIS — H353132 Nonexudative age-related macular degeneration, bilateral, intermediate dry stage: Secondary | ICD-10-CM | POA: Diagnosis not present

## 2023-01-05 DIAGNOSIS — H43813 Vitreous degeneration, bilateral: Secondary | ICD-10-CM | POA: Diagnosis not present

## 2023-01-05 DIAGNOSIS — M5451 Vertebrogenic low back pain: Secondary | ICD-10-CM | POA: Diagnosis not present

## 2023-01-05 DIAGNOSIS — H43393 Other vitreous opacities, bilateral: Secondary | ICD-10-CM | POA: Diagnosis not present

## 2023-01-05 DIAGNOSIS — M47896 Other spondylosis, lumbar region: Secondary | ICD-10-CM | POA: Diagnosis not present

## 2023-01-07 DIAGNOSIS — M5451 Vertebrogenic low back pain: Secondary | ICD-10-CM | POA: Diagnosis not present

## 2023-01-07 DIAGNOSIS — M47896 Other spondylosis, lumbar region: Secondary | ICD-10-CM | POA: Diagnosis not present

## 2023-01-11 DIAGNOSIS — M5451 Vertebrogenic low back pain: Secondary | ICD-10-CM | POA: Diagnosis not present

## 2023-01-11 DIAGNOSIS — M47896 Other spondylosis, lumbar region: Secondary | ICD-10-CM | POA: Diagnosis not present

## 2023-01-13 DIAGNOSIS — M47896 Other spondylosis, lumbar region: Secondary | ICD-10-CM | POA: Diagnosis not present

## 2023-01-13 DIAGNOSIS — M5451 Vertebrogenic low back pain: Secondary | ICD-10-CM | POA: Diagnosis not present

## 2023-01-19 DIAGNOSIS — H353231 Exudative age-related macular degeneration, bilateral, with active choroidal neovascularization: Secondary | ICD-10-CM | POA: Diagnosis not present

## 2023-01-19 DIAGNOSIS — H5213 Myopia, bilateral: Secondary | ICD-10-CM | POA: Diagnosis not present

## 2023-01-19 DIAGNOSIS — H26492 Other secondary cataract, left eye: Secondary | ICD-10-CM | POA: Diagnosis not present

## 2023-01-19 DIAGNOSIS — M47896 Other spondylosis, lumbar region: Secondary | ICD-10-CM | POA: Diagnosis not present

## 2023-01-19 DIAGNOSIS — H524 Presbyopia: Secondary | ICD-10-CM | POA: Diagnosis not present

## 2023-01-19 DIAGNOSIS — H02051 Trichiasis without entropian right upper eyelid: Secondary | ICD-10-CM | POA: Diagnosis not present

## 2023-01-19 DIAGNOSIS — M5451 Vertebrogenic low back pain: Secondary | ICD-10-CM | POA: Diagnosis not present

## 2023-01-21 DIAGNOSIS — M5451 Vertebrogenic low back pain: Secondary | ICD-10-CM | POA: Diagnosis not present

## 2023-01-21 DIAGNOSIS — M47896 Other spondylosis, lumbar region: Secondary | ICD-10-CM | POA: Diagnosis not present

## 2023-01-25 DIAGNOSIS — M47896 Other spondylosis, lumbar region: Secondary | ICD-10-CM | POA: Diagnosis not present

## 2023-01-25 DIAGNOSIS — M5451 Vertebrogenic low back pain: Secondary | ICD-10-CM | POA: Diagnosis not present

## 2023-01-27 DIAGNOSIS — M5451 Vertebrogenic low back pain: Secondary | ICD-10-CM | POA: Diagnosis not present

## 2023-01-27 DIAGNOSIS — M47896 Other spondylosis, lumbar region: Secondary | ICD-10-CM | POA: Diagnosis not present

## 2023-01-28 ENCOUNTER — Other Ambulatory Visit: Payer: Self-pay | Admitting: Nurse Practitioner

## 2023-01-28 MED ORDER — ATORVASTATIN CALCIUM 10 MG PO TABS: ORAL_TABLET | ORAL | 3 refills | Status: DC

## 2023-02-08 DIAGNOSIS — H43393 Other vitreous opacities, bilateral: Secondary | ICD-10-CM | POA: Diagnosis not present

## 2023-02-08 DIAGNOSIS — H353221 Exudative age-related macular degeneration, left eye, with active choroidal neovascularization: Secondary | ICD-10-CM | POA: Diagnosis not present

## 2023-02-08 DIAGNOSIS — H43813 Vitreous degeneration, bilateral: Secondary | ICD-10-CM | POA: Diagnosis not present

## 2023-02-08 DIAGNOSIS — H353132 Nonexudative age-related macular degeneration, bilateral, intermediate dry stage: Secondary | ICD-10-CM | POA: Diagnosis not present

## 2023-02-28 DIAGNOSIS — N401 Enlarged prostate with lower urinary tract symptoms: Secondary | ICD-10-CM | POA: Diagnosis not present

## 2023-02-28 DIAGNOSIS — R3914 Feeling of incomplete bladder emptying: Secondary | ICD-10-CM | POA: Diagnosis not present

## 2023-03-10 DIAGNOSIS — M47896 Other spondylosis, lumbar region: Secondary | ICD-10-CM | POA: Diagnosis not present

## 2023-03-10 DIAGNOSIS — M5416 Radiculopathy, lumbar region: Secondary | ICD-10-CM | POA: Diagnosis not present

## 2023-03-14 DIAGNOSIS — H43393 Other vitreous opacities, bilateral: Secondary | ICD-10-CM | POA: Diagnosis not present

## 2023-03-14 DIAGNOSIS — H43813 Vitreous degeneration, bilateral: Secondary | ICD-10-CM | POA: Diagnosis not present

## 2023-03-14 DIAGNOSIS — H353221 Exudative age-related macular degeneration, left eye, with active choroidal neovascularization: Secondary | ICD-10-CM | POA: Diagnosis not present

## 2023-03-14 DIAGNOSIS — H353132 Nonexudative age-related macular degeneration, bilateral, intermediate dry stage: Secondary | ICD-10-CM | POA: Diagnosis not present

## 2023-04-04 DIAGNOSIS — D485 Neoplasm of uncertain behavior of skin: Secondary | ICD-10-CM | POA: Diagnosis not present

## 2023-04-04 DIAGNOSIS — L570 Actinic keratosis: Secondary | ICD-10-CM | POA: Diagnosis not present

## 2023-04-04 DIAGNOSIS — L820 Inflamed seborrheic keratosis: Secondary | ICD-10-CM | POA: Diagnosis not present

## 2023-04-04 DIAGNOSIS — B079 Viral wart, unspecified: Secondary | ICD-10-CM | POA: Diagnosis not present

## 2023-04-04 DIAGNOSIS — L821 Other seborrheic keratosis: Secondary | ICD-10-CM | POA: Diagnosis not present

## 2023-04-18 DIAGNOSIS — H353132 Nonexudative age-related macular degeneration, bilateral, intermediate dry stage: Secondary | ICD-10-CM | POA: Diagnosis not present

## 2023-04-18 DIAGNOSIS — H353221 Exudative age-related macular degeneration, left eye, with active choroidal neovascularization: Secondary | ICD-10-CM | POA: Diagnosis not present

## 2023-04-18 DIAGNOSIS — H3562 Retinal hemorrhage, left eye: Secondary | ICD-10-CM | POA: Diagnosis not present

## 2023-04-18 DIAGNOSIS — H43813 Vitreous degeneration, bilateral: Secondary | ICD-10-CM | POA: Diagnosis not present

## 2023-04-18 DIAGNOSIS — H43393 Other vitreous opacities, bilateral: Secondary | ICD-10-CM | POA: Diagnosis not present

## 2023-05-13 DIAGNOSIS — G610 Guillain-Barre syndrome: Secondary | ICD-10-CM | POA: Diagnosis not present

## 2023-05-13 DIAGNOSIS — R262 Difficulty in walking, not elsewhere classified: Secondary | ICD-10-CM | POA: Diagnosis not present

## 2023-05-13 DIAGNOSIS — G629 Polyneuropathy, unspecified: Secondary | ICD-10-CM | POA: Diagnosis not present

## 2023-05-13 DIAGNOSIS — M6281 Muscle weakness (generalized): Secondary | ICD-10-CM | POA: Diagnosis not present

## 2023-05-13 DIAGNOSIS — Z9181 History of falling: Secondary | ICD-10-CM | POA: Diagnosis not present

## 2023-05-23 NOTE — Telephone Encounter (Signed)
Prolia VOB initiated via [email or web address]  Next Prolia inj DUE: 06/26/23

## 2023-05-26 DIAGNOSIS — H3562 Retinal hemorrhage, left eye: Secondary | ICD-10-CM | POA: Diagnosis not present

## 2023-05-26 DIAGNOSIS — H43393 Other vitreous opacities, bilateral: Secondary | ICD-10-CM | POA: Diagnosis not present

## 2023-05-26 DIAGNOSIS — H43813 Vitreous degeneration, bilateral: Secondary | ICD-10-CM | POA: Diagnosis not present

## 2023-05-26 DIAGNOSIS — H353221 Exudative age-related macular degeneration, left eye, with active choroidal neovascularization: Secondary | ICD-10-CM | POA: Diagnosis not present

## 2023-05-26 DIAGNOSIS — H353132 Nonexudative age-related macular degeneration, bilateral, intermediate dry stage: Secondary | ICD-10-CM | POA: Diagnosis not present

## 2023-06-11 NOTE — Telephone Encounter (Signed)
Prior Authorization initiated for [facility] via [employer] Case ID: [id number]

## 2023-06-11 NOTE — Telephone Encounter (Signed)
Prior Auth renewal REQUIRED for PROLIA  PA PROCESS DETAILS: PA is required. Call [phone] or complete the PA form available at [email or web address] Fax completed form to [phone]. Or submit PA request online at [email or web address]

## 2023-06-14 DIAGNOSIS — D485 Neoplasm of uncertain behavior of skin: Secondary | ICD-10-CM | POA: Diagnosis not present

## 2023-06-14 DIAGNOSIS — L820 Inflamed seborrheic keratosis: Secondary | ICD-10-CM | POA: Diagnosis not present

## 2023-06-14 DIAGNOSIS — L570 Actinic keratosis: Secondary | ICD-10-CM | POA: Diagnosis not present

## 2023-06-14 DIAGNOSIS — D044 Carcinoma in situ of skin of scalp and neck: Secondary | ICD-10-CM | POA: Diagnosis not present

## 2023-06-17 MED ORDER — DENOSUMAB 60 MG/ML ~~LOC~~ SOSY
60.0000 mg | PREFILLED_SYRINGE | SUBCUTANEOUS | Status: AC
Start: 2023-06-26 — End: 2024-06-19

## 2023-06-17 NOTE — Addendum Note (Signed)
Addended by: Dierdre Searles on: 06/17/2023 05:31 PM   Modules accepted: Orders

## 2023-06-17 NOTE — Telephone Encounter (Signed)
Pt ready for scheduling on or after 06/26/23  Out-of-pocket cost due at time of visit: $345  Primary: [employer] Medicare Advantage PPO Prolia co-insurance: 20% (approximately $320) Admin fee co-insurance: 20% (approximately $25)  Deductible: does not apply  Prior Auth: APPROVED PA# [id number] Valid: 06/11/23-06/10/24  Secondary: N/A Prolia co-insurance:  Admin fee co-insurance:  Deductible:  Prior Auth:  PA# Valid:   ** This summary of benefits is an estimation of the patient's out-of-pocket cost. Exact cost may vary based on individual plan coverage.

## 2023-06-17 NOTE — Telephone Encounter (Signed)
Prior Auth for [facility]  APPROVED PA# [id number] Valid: 06/11/23-06/10/24

## 2023-06-20 NOTE — Telephone Encounter (Signed)
Patient is scheduled for 06/21/2023 at 2:30 PM for Prolia injection.

## 2023-06-21 ENCOUNTER — Ambulatory Visit: Payer: Medicare HMO

## 2023-06-21 VITALS — BP 122/80 | HR 89 | Ht 73.0 in | Wt 186.0 lb

## 2023-06-21 DIAGNOSIS — M810 Age-related osteoporosis without current pathological fracture: Secondary | ICD-10-CM | POA: Diagnosis not present

## 2023-06-21 MED ORDER — DENOSUMAB 60 MG/ML ~~LOC~~ SOSY
60.0000 mg | PREFILLED_SYRINGE | Freq: Once | SUBCUTANEOUS | Status: AC
Start: 2023-06-21 — End: 2023-06-21
  Administered 2023-06-21: 60 mg via SUBCUTANEOUS

## 2023-06-21 NOTE — Progress Notes (Signed)
After obtaining consent, and per orders of Dr. Elvera Lennox, injection of Prolia 60mg   given by Lyndal Alamillo L Amay Mijangos in right SQ. Patient instructed to remain in clinic for 20 minutes afterwards, and to report any adverse reaction to me immediately.

## 2023-06-30 DIAGNOSIS — H43813 Vitreous degeneration, bilateral: Secondary | ICD-10-CM | POA: Diagnosis not present

## 2023-06-30 DIAGNOSIS — H43393 Other vitreous opacities, bilateral: Secondary | ICD-10-CM | POA: Diagnosis not present

## 2023-06-30 DIAGNOSIS — H353132 Nonexudative age-related macular degeneration, bilateral, intermediate dry stage: Secondary | ICD-10-CM | POA: Diagnosis not present

## 2023-06-30 DIAGNOSIS — H353221 Exudative age-related macular degeneration, left eye, with active choroidal neovascularization: Secondary | ICD-10-CM | POA: Diagnosis not present

## 2023-06-30 DIAGNOSIS — H3562 Retinal hemorrhage, left eye: Secondary | ICD-10-CM | POA: Diagnosis not present

## 2023-07-19 DIAGNOSIS — L570 Actinic keratosis: Secondary | ICD-10-CM | POA: Diagnosis not present

## 2023-07-19 DIAGNOSIS — D044 Carcinoma in situ of skin of scalp and neck: Secondary | ICD-10-CM | POA: Diagnosis not present

## 2023-07-27 NOTE — Progress Notes (Signed)
Brandon Ray Date of Birth: 05/31/1935   History of Present Illness: Brandon Ray is seen today for followup. He has a history of coronary disease and is status post CABG in 2004 after a NSTEMI. He had severe 3 vessel disease and normal LV function. 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 This has significantly limited his activity.  He has had some edema over the past year but this became worse over the past week. Since he started using compression hose this has improved. He did have SBO in January 2018 that resolved with conservative measures.  He was seen in our office in April. Noted some DOE that he felt was due to deconditioning. Declined further work up at that time.   On follow up today he reports he is less steady on his feet even with a walker. Has fallen some. Notes some LE edema and SOB. Neuropathy is still a major problem. He does wear compression hose. Notes lipitor dose was increased in April from every other day to 5 days a week.     Current Outpatient Medications on File Prior to Visit  Medication Sig Dispense Refill   aspirin 81 MG EC tablet Take by mouth.     atorvastatin (LIPITOR) 10 MG tablet Take one (1) tablet by mouth ( 10 mg ) 5 times weekly. 75 tablet 3   calcium-vitamin D (OSCAL WITH D) 500-200 MG-UNIT per tablet Take 1 tablet by mouth daily at 2 PM.     Cholecalciferol (VITAMIN D-3) 5000 units TABS Take 5,000 Units by mouth every evening. Take 5000 IU 5 x week     Cyanocobalamin (B-12) 2500 MCG TABS Take by mouth.     Multiple Vitamins-Minerals (PRESERVISION/LUTEIN) CAPS Take by mouth 2 (two) times daily.     Omega-3 Fatty Acids (FISH OIL) 1200 MG CAPS Take 1,200 mg by mouth at bedtime.      PHENobarbital (LUMINAL) 97.2 MG tablet Take 97.2 mg by mouth at bedtime.     polyethylene glycol (MIRALAX / GLYCOLAX) 17 g packet Take 17 g by mouth daily. Pt uses as neded.     pregabalin (LYRICA) 150 MG capsule Take 150 mg by mouth 2 (two) times  daily.     tamsulosin (FLOMAX) 0.4 MG CAPS capsule Take 0.4 mg by mouth at bedtime.      Current Facility-Administered Medications on File Prior to Visit  Medication Dose Route Frequency Provider Last Rate Last Admin   denosumab (PROLIA) injection 60 mg  60 mg Subcutaneous Q6 months Carlus Pavlov, MD        Allergies  Allergen Reactions   Amoxicillin Anaphylaxis and Rash    Upper torso only. Has patient had a PCN reaction causing immediate rash, facial/tongue/throat swelling, SOB or lightheadedness with hypotension: No Has patient had a PCN reaction causing severe rash involving mucus membranes or skin necrosis: No Has patient had a PCN reaction that required hospitalization: No Has patient had a PCN reaction occurring within the last 10 years: Yes If all of the above answers are "NO", then may proceed with Cephalosporin use.    Tetanus Toxoids Other (See Comments)    TDP vaccine- family stated this caused neuropathy.   Boostrix [Tetanus-Diphth-Acell Pertussis] Other (See Comments)    Neuropathy symptoms   Diphth-Acell Pertussis-Tetanus    Other Other (See Comments)    Flu shot-neuropathy symptoms    Past Medical History:  Diagnosis Date   Arthritis    BPH (benign prostatic hypertrophy)  2014   Coronary artery disease 01/2003   CABG   Free monoclonal light chain    urine   History of blood transfusion 2004   Hyperlipidemia    on medication   Macular degeneration    both eyes   Peripheral neuropathy 05/2011    follow up with Dr. Hart Carwin multiple issues   Pleural effusion 05/2011   from abdominal surgery   Seizures (HCC) 1950's   last one 1961   Small bowel obstruction (HCC) 05/2011   due to adhesion   Weakness 2013-May began   lower  from neuropathy from knees down    Past Surgical History:  Procedure Laterality Date   ABDOMINAL SURGERY  05/2011   bowel obstruction   ANKLE FRACTURE SURGERY  1997   left   CARDIAC CATHETERIZATION  01/27/2003   NORMAL LEFT  VENTRICULAR SIZE WITH MILD FOCAL LATERAL WALL HYPOKINESIA. EF 60%   CORONARY ARTERY BYPASS GRAFT  2004   LIMA GRAFT TO THE LAD, SAPHENOUS VEIN GRAFT TO THE DIAGONAL, SAPHENOUS VEIN GRAFT TO THE FIRST OM, SAPHENOUS VEIN GRAFT TO THE PDA   HAND SURGERY  2000   left   TOTAL KNEE ARTHROPLASTY Right 01/03/2018   Procedure: RIGHT TOTAL KNEE ARTHROPLASTY;  Surgeon: Durene Romans, MD;  Location: [facility];  Service: Orthopedics;  Laterality: Right;   TRANSURETHRAL RESECTION OF PROSTATE  1992   TRANSURETHRAL RESECTION OF PROSTATE N/A 12/22/2012   Procedure: TRANSURETHRAL RESECTION OF THE PROSTATE WITH GYRUS (TURP);  Surgeon: Antony Haste, MD;  Location: [facility];  Service: Urology;  Laterality: N/A;    Social History   Tobacco Use  Smoking Status Former   Current packs/day: 0.00   Average packs/day: 1 pack/day for 5.0 years (5.0 ttl pk-yrs)   Types: Cigarettes   Start date: 07/01/1960   Quit date: 07/01/1965   Years since quitting: 58.1  Smokeless Tobacco Never    Social History   Substance and Sexual Activity  Alcohol Use No    Family History  Problem Relation Age of Onset   Asthma Mother    Heart disease Mother 72   Coronary artery disease Father    Heart disease Father    Cancer Sister 79       breast    Review of Systems: As noted in history of present illness.  All other systems were reviewed and are negative.  Physical Exam: BP (!) 128/58 (BP Location: Left Arm, Patient Position: Sitting, Cuff Size: Large)   Pulse 64   Ht 6' (1.829 m)   Wt 193 lb 6.4 oz (87.7 kg)   SpO2 99%   BMI 26.23 kg/m  GENERAL:  Well appearing WM in NAD HEENT:  PERRL, EOMI, sclera are clear. Oropharynx is clear. NECK:  No jugular venous distention, carotid upstroke brisk and symmetric, no bruits, no thyromegaly or adenopathy LUNGS:  Clear to auscultation bilaterally CHEST:  Unremarkable HEART:  RRR,  PMI not displaced or sustained,S1 and S2 within normal limits, no  S3, no S4: no clicks, no rubs, no murmurs ABD:  Soft, nontender. BS +, no masses or bruits. No hepatomegaly, no splenomegaly EXT:  2 + pulses throughout, he is wearing leg braces bilaterally, 1+ Edema, no cyanosis no clubbing SKIN:  Warm and dry.  No rashes NEURO:  Alert and oriented x 3. Cranial nerves II through XII intact. PSYCH:  Cognitively intact    LABORATORY DATA: Lab Results  Component Value Date   WBC 4.2 01/07/2021   HGB  15.3 01/07/2021   HCT 46.2 01/07/2021   PLT 193 01/07/2021   GLUCOSE 110 (H) 01/07/2021   CHOL 188 12/24/2022   TRIG 81 12/24/2022   HDL 76 12/24/2022   LDLCALC 97 12/24/2022   ALT 42 04/08/2021   AST 43 (H) 04/08/2021   NA 134 (L) 01/07/2021   K 3.2 (L) 01/07/2021   CL 102 01/07/2021   CREATININE 0.87 01/07/2021   BUN 19 01/07/2021   CO2 25 01/07/2021   TSH 2.65 10/19/2017   PSA 0.97 10/28/2021   INR 1.05 06/07/2012   HGBA1C 6.1 09/28/2012   Labs from [facility] 10/15/15: Normal CBC and CMET.  Dated 03/31/17: normal CMET and CBC.  Dated 10/12/17: sodium 130 otherwise CMET normal. CBC normal. 10/11/18: normal CMET and CBC   EKG Interpretation Date/Time:  Monday August 01 2023 15:47:59 EST Ventricular Rate:  64 PR Interval:  184 QRS Duration:  148 QT Interval:  444 QTC Calculation: 458 R Axis:   -88  Text Interpretation: Normal sinus rhythm Left axis deviation Right bundle branch block No significant change since last tracing Confirmed by [place], Cierra Rothgeb [phone]) on 08/01/2023 3:56:25 PM    Assessment / Plan: 1. Coronary disease status post CABG in 2004. His last nuclear stress test in May of 2011 was normal. We will continue with his medical management and risk factor modification. He is asymptomatic.   2. Hyperlipidemia on statin. Will check lipid panel today.  3. PolyNeuropathy.  4. Leg edema and SOB. Will check CBC, Chemistries, TSH and BNP. Order Echo. For now would not add diuretic since this may aggravate his imbalance

## 2023-07-27 NOTE — Telephone Encounter (Signed)
Last Prolia inj 06/21/23 Next Prolia inj due 12/21/23

## 2023-08-01 ENCOUNTER — Ambulatory Visit: Payer: Medicare HMO | Attending: Cardiology | Admitting: Cardiology

## 2023-08-01 ENCOUNTER — Encounter: Payer: Self-pay | Admitting: Cardiology

## 2023-08-01 VITALS — BP 128/58 | HR 64 | Ht 72.0 in | Wt 193.4 lb

## 2023-08-01 DIAGNOSIS — R0609 Other forms of dyspnea: Secondary | ICD-10-CM

## 2023-08-01 DIAGNOSIS — M47816 Spondylosis without myelopathy or radiculopathy, lumbar region: Secondary | ICD-10-CM | POA: Diagnosis not present

## 2023-08-01 DIAGNOSIS — I25810 Atherosclerosis of coronary artery bypass graft(s) without angina pectoris: Secondary | ICD-10-CM | POA: Diagnosis not present

## 2023-08-01 DIAGNOSIS — R609 Edema, unspecified: Secondary | ICD-10-CM | POA: Diagnosis not present

## 2023-08-01 DIAGNOSIS — M5416 Radiculopathy, lumbar region: Secondary | ICD-10-CM | POA: Diagnosis not present

## 2023-08-01 NOTE — Patient Instructions (Signed)
Medication Instructions:  Continue same medications *If you need a refill on your cardiac medications before your next appointment, please call your pharmacy*   Lab Work: Cmet,lipid panel,cbc,tsh,bnp today   Testing/Procedures: Schedule Echo  first available   Follow-Up: At [facility], you and your health needs are our priority.  As part of our continuing mission to provide you with exceptional heart care, we have created designated Provider Care Teams.  These Care Teams include your primary Cardiologist (physician) and Advanced Practice Providers (APPs -  Physician Assistants and Nurse Practitioners) who all work together to provide you with the care you need, when you need it.  We recommend signing up for the patient portal called "MyChart".  Sign up information is provided on this After Visit Summary.  MyChart is used to connect with patients for Virtual Visits (Telemedicine).  Patients are able to view lab/test results, encounter notes, upcoming appointments, etc.  Non-urgent messages can be sent to your provider as well.   To learn more about what you can do with MyChart, go to [email or web address].    Your next appointment:  After Echo    Provider:  Dr.Jordan

## 2023-08-02 LAB — COMPREHENSIVE METABOLIC PANEL
ALT: 25 [IU]/L (ref 0–44)
AST: 24 [IU]/L (ref 0–40)
Albumin: 4.2 g/dL (ref 3.7–4.7)
Alkaline Phosphatase: 101 [IU]/L (ref 44–121)
BUN/Creatinine Ratio: 21 (ref 10–24)
BUN: 16 mg/dL (ref 8–27)
Bilirubin Total: 0.3 mg/dL (ref 0.0–1.2)
CO2: 27 mmol/L (ref 20–29)
Calcium: 9.3 mg/dL (ref 8.6–10.2)
Chloride: 101 mmol/L (ref 96–106)
Creatinine, Ser: 0.75 mg/dL — ABNORMAL LOW (ref 0.76–1.27)
Globulin, Total: 1.9 g/dL (ref 1.5–4.5)
Glucose: 117 mg/dL — ABNORMAL HIGH (ref 70–99)
Potassium: 4.4 mmol/L (ref 3.5–5.2)
Sodium: 140 mmol/L (ref 134–144)
Total Protein: 6.1 g/dL (ref 6.0–8.5)
eGFR: 88 mL/min/{1.73_m2} (ref >59)

## 2023-08-02 LAB — CBC WITH DIFFERENTIAL/PLATELET
Basophils Absolute: 0 10*3/uL (ref 0.0–0.2)
Basos: 1 %
EOS (ABSOLUTE): 0.2 10*3/uL (ref 0.0–0.4)
Eos: 5 %
Hematocrit: 39.9 % (ref 37.5–51.0)
Hemoglobin: 13.3 g/dL (ref 13.0–17.7)
Immature Grans (Abs): 0 10*3/uL (ref 0.0–0.1)
Immature Granulocytes: 0 %
Lymphocytes Absolute: 1.5 10*3/uL (ref 0.7–3.1)
Lymphs: 35 %
MCH: 31.4 pg (ref 26.6–33.0)
MCHC: 33.3 g/dL (ref 31.5–35.7)
MCV: 94 fL (ref 79–97)
Monocytes Absolute: 0.3 10*3/uL (ref 0.1–0.9)
Monocytes: 6 %
Neutrophils Absolute: 2.2 10*3/uL (ref 1.4–7.0)
Neutrophils: 53 %
Platelets: 198 10*3/uL (ref 150–450)
RBC: 4.24 x10E6/uL (ref 4.14–5.80)
RDW: 12.3 % (ref 11.6–15.4)
WBC: 4.2 10*3/uL (ref 3.4–10.8)

## 2023-08-02 LAB — LIPID PANEL
Chol/HDL Ratio: 2.3 ratio (ref 0.0–5.0)
Cholesterol, Total: 181 mg/dL (ref 100–199)
HDL: 78 mg/dL (ref >39)
LDL Chol Calc (NIH): 90 mg/dL (ref 0–99)
Triglycerides: 71 mg/dL (ref 0–149)
VLDL Cholesterol Cal: 13 mg/dL (ref 5–40)

## 2023-08-02 LAB — BRAIN NATRIURETIC PEPTIDE: BNP: 51.8 pg/mL (ref 0.0–100.0)

## 2023-08-02 LAB — TSH: TSH: 2.93 u[IU]/mL (ref 0.450–4.500)

## 2023-08-04 ENCOUNTER — Telehealth: Payer: Self-pay | Admitting: Cardiology

## 2023-08-04 ENCOUNTER — Other Ambulatory Visit: Payer: Self-pay | Admitting: *Deleted

## 2023-08-04 DIAGNOSIS — H353132 Nonexudative age-related macular degeneration, bilateral, intermediate dry stage: Secondary | ICD-10-CM | POA: Diagnosis not present

## 2023-08-04 DIAGNOSIS — H353221 Exudative age-related macular degeneration, left eye, with active choroidal neovascularization: Secondary | ICD-10-CM | POA: Diagnosis not present

## 2023-08-04 DIAGNOSIS — E785 Hyperlipidemia, unspecified: Secondary | ICD-10-CM

## 2023-08-04 DIAGNOSIS — H43393 Other vitreous opacities, bilateral: Secondary | ICD-10-CM | POA: Diagnosis not present

## 2023-08-04 DIAGNOSIS — H43813 Vitreous degeneration, bilateral: Secondary | ICD-10-CM | POA: Diagnosis not present

## 2023-08-04 DIAGNOSIS — H3562 Retinal hemorrhage, left eye: Secondary | ICD-10-CM | POA: Diagnosis not present

## 2023-08-04 MED ORDER — EZETIMIBE 10 MG PO TABS: 10.0000 mg | ORAL_TABLET | Freq: Every day | ORAL | 3 refills | Status: DC

## 2023-08-04 NOTE — Telephone Encounter (Signed)
Patient is returning call in regards to results. Requesting return call.  

## 2023-08-04 NOTE — Telephone Encounter (Signed)
Peter M [place], MD 08/02/2023  3:47 PM EST     All values are normal or within acceptable limits.   Medication changes / Follow up labs / Other changes or recommendations:   BNP is normal     Peter [place], MD 08/02/2023 3:47 PM   Peter M [place], MD 08/02/2023  9:47 AM EST     The following abnormalities are noted:  chemistries and CBC are normal. LDL 90 is not at goal. BNP level is pending All other values are normal, stable or within acceptable limits. Medication changes / Follow up labs / Other changes or recommendations:   There are concerns about higher statin dose given leg weakness. Would suggest adding Zetia to current statin dose to help cholesterol. Start 10 mg daily. Repeat lipid panel in 3 months.   Peter [place], MD 08/02/2023 9:46 AM   Patient identification verified by 2 forms. Marilynn Rail, RN   Called and spoke to patient  Relayed provider result message  Reviewed rx instruction/education, aware sent to preferred pharmacy  Patient verbalized understanding, no questions at this time

## 2023-08-10 DIAGNOSIS — M5416 Radiculopathy, lumbar region: Secondary | ICD-10-CM | POA: Diagnosis not present

## 2023-08-16 DIAGNOSIS — Z5189 Encounter for other specified aftercare: Secondary | ICD-10-CM | POA: Diagnosis not present

## 2023-08-31 DIAGNOSIS — R3914 Feeling of incomplete bladder emptying: Secondary | ICD-10-CM | POA: Diagnosis not present

## 2023-08-31 DIAGNOSIS — N312 Flaccid neuropathic bladder, not elsewhere classified: Secondary | ICD-10-CM | POA: Diagnosis not present

## 2023-08-31 DIAGNOSIS — N401 Enlarged prostate with lower urinary tract symptoms: Secondary | ICD-10-CM | POA: Diagnosis not present

## 2023-09-09 ENCOUNTER — Ambulatory Visit (HOSPITAL_COMMUNITY): Payer: Medicare HMO | Attending: Cardiology

## 2023-09-09 DIAGNOSIS — I25810 Atherosclerosis of coronary artery bypass graft(s) without angina pectoris: Secondary | ICD-10-CM | POA: Diagnosis not present

## 2023-09-09 DIAGNOSIS — R0609 Other forms of dyspnea: Secondary | ICD-10-CM | POA: Diagnosis not present

## 2023-09-09 DIAGNOSIS — R609 Edema, unspecified: Secondary | ICD-10-CM | POA: Diagnosis not present

## 2023-09-09 LAB — ECHOCARDIOGRAM COMPLETE
Area-P 1/2: 2.59 cm2
S' Lateral: 3.2 cm

## 2023-09-21 NOTE — Progress Notes (Deleted)
 Brandon Ray Date of Birth: 1934/11/10   History of Present Illness: Brandon Ray is seen today for followup. He has a history of coronary disease and is status post CABG in 2004 after a NSTEMI. He had severe 3 vessel disease and normal LV function. 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 This has significantly limited his activity.  He has had some edema over the past year but this became worse over the past week. Since he started using compression hose this has improved. He did have SBO in January 2018 that resolved with conservative measures.  He was seen in our office in April. Noted some DOE that he felt was due to deconditioning. Declined further work up at that time.   On follow up today he reports he is less steady on his feet even with a walker. Has fallen some. Notes some LE edema and SOB. Neuropathy is still a major problem. He does wear compression hose. Notes lipitor dose was increased in April from every other day to 5 days a week.   Labs were done and were normal including BNP. Echo was normal.     Current Outpatient Medications on File Prior to Visit  Medication Sig Dispense Refill   aspirin  81 MG EC tablet Take by mouth.     atorvastatin  (LIPITOR) 10 MG tablet Take one (1) tablet by mouth ( 10 mg ) 5 times weekly. 75 tablet 3   calcium -vitamin D  (OSCAL WITH D) 500-200 MG-UNIT per tablet Take 1 tablet by mouth daily at 2 PM.     Cholecalciferol (VITAMIN D -3) 5000 units TABS Take 5,000 Units by mouth every evening. Take 5000 IU 5 x week     Cyanocobalamin  (B-12) 2500 MCG TABS Take by mouth.     ezetimibe  (ZETIA ) 10 MG tablet Take 1 tablet (10 mg total) by mouth daily. 90 tablet 3   Multiple Vitamins-Minerals (PRESERVISION/LUTEIN) CAPS Take by mouth 2 (two) times daily.     Omega-3 Fatty Acids (FISH OIL ) 1200 MG CAPS Take 1,200 mg by mouth at bedtime.      PHENobarbital  (LUMINAL) 97.2 MG tablet Take 97.2 mg by mouth at bedtime.     polyethylene  glycol (MIRALAX  / GLYCOLAX ) 17 g packet Take 17 g by mouth daily. Pt uses as neded.     pregabalin  (LYRICA ) 150 MG capsule Take 150 mg by mouth 2 (two) times daily.     tamsulosin  (FLOMAX ) 0.4 MG CAPS capsule Take 0.4 mg by mouth at bedtime.      Current Facility-Administered Medications on File Prior to Visit  Medication Dose Route Frequency Provider Last Rate Last Admin   denosumab  (PROLIA ) injection 60 mg  60 mg Subcutaneous Q6 months Trixie File, MD        Allergies  Allergen Reactions   Amoxicillin Anaphylaxis and Rash    Upper torso only. Has patient had a PCN reaction causing immediate rash, facial/tongue/throat swelling, SOB or lightheadedness with hypotension: No Has patient had a PCN reaction causing severe rash involving mucus membranes or skin necrosis: No Has patient had a PCN reaction that required hospitalization: No Has patient had a PCN reaction occurring within the last 10 years: Yes If all of the above answers are NO, then may proceed with Cephalosporin use.    Tetanus Toxoids Other (See Comments)    TDP vaccine- family stated this caused neuropathy.   Boostrix [Tetanus-Diphth-Acell Pertussis] Other (See Comments)    Neuropathy symptoms   Diphth-Acell Pertussis-Tetanus  Other Other (See Comments)    Flu shot-neuropathy symptoms    Past Medical History:  Diagnosis Date   Arthritis    BPH (benign prostatic hypertrophy) 2014   Coronary artery disease 01/2003   CABG   Free monoclonal light chain    urine   History of blood transfusion 2004   Hyperlipidemia    on medication   Macular degeneration    both eyes   Peripheral neuropathy 05/2011    follow up with Dr. Jeanetta multiple issues   Pleural effusion 05/2011   from abdominal surgery   Seizures (HCC) 1950's   last one 1961   Small bowel obstruction (HCC) 05/2011   due to adhesion   Weakness 2013-May began   lower  from neuropathy from knees down    Past Surgical History:  Procedure  Laterality Date   ABDOMINAL SURGERY  05/2011   bowel obstruction   ANKLE FRACTURE SURGERY  1997   left   CARDIAC CATHETERIZATION  01/27/2003   NORMAL LEFT VENTRICULAR SIZE WITH MILD FOCAL LATERAL WALL HYPOKINESIA. EF 60%   CORONARY ARTERY BYPASS GRAFT  2004   LIMA GRAFT TO THE LAD, SAPHENOUS VEIN GRAFT TO THE DIAGONAL, SAPHENOUS VEIN GRAFT TO THE FIRST OM, SAPHENOUS VEIN GRAFT TO THE PDA   HAND SURGERY  2000   left   TOTAL KNEE ARTHROPLASTY Right 01/03/2018   Procedure: RIGHT TOTAL KNEE ARTHROPLASTY;  Surgeon: Ernie Cough, MD;  Location: [facility];  Service: Orthopedics;  Laterality: Right;   TRANSURETHRAL RESECTION OF PROSTATE  1992   TRANSURETHRAL RESECTION OF PROSTATE N/A 12/22/2012   Procedure: TRANSURETHRAL RESECTION OF THE PROSTATE WITH GYRUS (TURP);  Surgeon: Cough Gwenyth Brooks, MD;  Location: [facility];  Service: Urology;  Laterality: N/A;    Social History   Tobacco Use  Smoking Status Former   Current packs/day: 0.00   Average packs/day: 1 pack/day for 5.0 years (5.0 ttl pk-yrs)   Types: Cigarettes   Start date: 07/01/1960   Quit date: 07/01/1965   Years since quitting: 58.2  Smokeless Tobacco Never    Social History   Substance and Sexual Activity  Alcohol Use No    Family History  Problem Relation Age of Onset   Asthma Mother    Heart disease Mother 25   Coronary artery disease Father    Heart disease Father    Cancer Sister 41       breast    Review of Systems: As noted in history of present illness.  All other systems were reviewed and are negative.  Physical Exam: There were no vitals taken for this visit. GENERAL:  Well appearing WM in NAD HEENT:  PERRL, EOMI, sclera are clear. Oropharynx is clear. NECK:  No jugular venous distention, carotid upstroke brisk and symmetric, no bruits, no thyromegaly or adenopathy LUNGS:  Clear to auscultation bilaterally CHEST:  Unremarkable HEART:  RRR,  PMI not displaced or sustained,S1 and S2  within normal limits, no S3, no S4: no clicks, no rubs, no murmurs ABD:  Soft, nontender. BS +, no masses or bruits. No hepatomegaly, no splenomegaly EXT:  2 + pulses throughout, he is wearing leg braces bilaterally, 1+ Edema, no cyanosis no clubbing SKIN:  Warm and dry.  No rashes NEURO:  Alert and oriented x 3. Cranial nerves II through XII intact. PSYCH:  Cognitively intact    LABORATORY DATA: Lab Results  Component Value Date   WBC 4.2 08/01/2023   HGB 13.3 08/01/2023   HCT  39.9 08/01/2023   PLT 198 08/01/2023   GLUCOSE 117 (H) 08/01/2023   CHOL 181 08/01/2023   TRIG 71 08/01/2023   HDL 78 08/01/2023   LDLCALC 90 08/01/2023   ALT 25 08/01/2023   AST 24 08/01/2023   NA 140 08/01/2023   K 4.4 08/01/2023   CL 101 08/01/2023   CREATININE 0.75 (L) 08/01/2023   BUN 16 08/01/2023   CO2 27 08/01/2023   TSH 2.930 08/01/2023   PSA 0.97 10/28/2021   INR 1.05 06/07/2012   HGBA1C 6.1 09/28/2012   Labs from [facility] 10/15/15: Normal CBC and CMET.  Dated 03/31/17: normal CMET and CBC.  Dated 10/12/17: sodium 130 otherwise CMET normal. CBC normal. 10/11/18: normal CMET and CBC  Echo 09/09/23: IMPRESSIONS     1. Left ventricular ejection fraction, by estimation, is 55 to 60%. Left  ventricular ejection fraction by PLAX is 56 %. The left ventricle has  normal function. The left ventricle has no regional wall motion  abnormalities. Left ventricular diastolic  parameters are consistent with Grade I diastolic dysfunction (impaired  relaxation). The average left ventricular global longitudinal strain is  19.8 %. The global longitudinal strain is normal.   2. Right ventricular systolic function is normal. The right ventricular  size is normal.   3. The mitral valve is grossly normal. No evidence of mitral valve  regurgitation.   4. The aortic valve is tricuspid. Aortic valve regurgitation is not  visualized.   Comparison(s): No prior Echocardiogram.        Assessment / Plan: 1.  Coronary disease status post CABG in 2004. His last nuclear stress test in May of 2011 was normal. We will continue with his medical management and risk factor modification. He is asymptomatic.   2. Hyperlipidemia on statin. Will check lipid panel today.  3. PolyNeuropathy.  4. Leg edema and SOB. Will check CBC, Chemistries, TSH and BNP. Order Echo. For now would not add diuretic since this may aggravate his imbalance

## 2023-09-29 ENCOUNTER — Ambulatory Visit: Payer: Medicare HMO | Admitting: Cardiology

## 2023-10-03 NOTE — Progress Notes (Signed)
Brandon Ray Date of Birth: May 22, 1935   History of Present Illness: [name] is seen today for followup. He has a history of coronary disease and is status post CABG in 2004 after a NSTEMI. He had severe 3 vessel disease and normal LV function. 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 This has significantly limited his activity.  He has had some edema over the past year but this became worse over the past week. Since he started using compression hose this has improved. He did have SBO in January 2018 that resolved with conservative measures.  He was seen in our office in April. Noted some DOE that he felt was due to deconditioning. Declined further work up at that time.   When last seen noted LE edema and SOB. Neuropathy is still a major problem. He does wear compression hose. Labs were done and were normal including BNP. Echo was normal.   He still has swelling. Wife is very concerned. Had an upper respiratory bug over the holidays but this has cleared.     Current Outpatient Medications on File Prior to Visit  Medication Sig Dispense Refill   aspirin 81 MG EC tablet Take by mouth.     atorvastatin (LIPITOR) 10 MG tablet Take one (1) tablet by mouth ( 10 mg ) 5 times weekly. 75 tablet 3   calcium-vitamin D (OSCAL WITH D) 500-200 MG-UNIT per tablet Take 1 tablet by mouth daily at 2 PM.     Cholecalciferol (VITAMIN D-3) 5000 units TABS Take 5,000 Units by mouth every evening. Take 5000 IU 5 x week     Cyanocobalamin (B-12) 2500 MCG TABS Take by mouth.     ezetimibe (ZETIA) 10 MG tablet Take 1 tablet (10 mg total) by mouth daily. 90 tablet 3   Multiple Vitamins-Minerals (PRESERVISION/LUTEIN) CAPS Take by mouth 2 (two) times daily.     Omega-3 Fatty Acids (FISH OIL) 1200 MG CAPS Take 1,200 mg by mouth at bedtime.      PHENobarbital (LUMINAL) 97.2 MG tablet Take 97.2 mg by mouth at bedtime.     polyethylene glycol (MIRALAX / GLYCOLAX) 17 g packet Take 17 g by  mouth daily. Pt uses as neded.     pregabalin (LYRICA) 150 MG capsule Take 150 mg by mouth 2 (two) times daily.     tamsulosin (FLOMAX) 0.4 MG CAPS capsule Take 0.4 mg by mouth at bedtime.      Current Facility-Administered Medications on File Prior to Visit  Medication Dose Route Frequency Provider Last Rate Last Admin   denosumab (PROLIA) injection 60 mg  60 mg Subcutaneous Q6 months Carlus Pavlov, MD        Allergies  Allergen Reactions   Amoxicillin Anaphylaxis and Rash    Upper torso only. Has patient had a PCN reaction causing immediate rash, facial/tongue/throat swelling, SOB or lightheadedness with hypotension: No Has patient had a PCN reaction causing severe rash involving mucus membranes or skin necrosis: No Has patient had a PCN reaction that required hospitalization: No Has patient had a PCN reaction occurring within the last 10 years: Yes If all of the above answers are "NO", then may proceed with Cephalosporin use.    Tetanus Toxoids Other (See Comments)    TDP vaccine- family stated this caused neuropathy.   Boostrix [Tetanus-Diphth-Acell Pertussis] Other (See Comments)    Neuropathy symptoms   Diphth-Acell Pertussis-Tetanus    Other Other (See Comments)    Flu shot-neuropathy symptoms  Past Medical History:  Diagnosis Date   Arthritis    BPH (benign prostatic hypertrophy) 2014   Coronary artery disease 01/2003   CABG   Free monoclonal light chain    urine   History of blood transfusion 2004   Hyperlipidemia    on medication   Macular degeneration    both eyes   Peripheral neuropathy 05/2011    follow up with Dr. Hart Carwin multiple issues   Pleural effusion 05/2011   from abdominal surgery   Seizures (HCC) 1950's   last one 1961   Small bowel obstruction (HCC) 05/2011   due to adhesion   Weakness 2013-May began   lower  from neuropathy from knees down    Past Surgical History:  Procedure Laterality Date   ABDOMINAL SURGERY  05/2011   bowel  obstruction   ANKLE FRACTURE SURGERY  1997   left   CARDIAC CATHETERIZATION  01/27/2003   NORMAL LEFT VENTRICULAR SIZE WITH MILD FOCAL LATERAL WALL HYPOKINESIA. EF 60%   CORONARY ARTERY BYPASS GRAFT  2004   LIMA GRAFT TO THE LAD, SAPHENOUS VEIN GRAFT TO THE DIAGONAL, SAPHENOUS VEIN GRAFT TO THE FIRST OM, SAPHENOUS VEIN GRAFT TO THE PDA   HAND SURGERY  2000   left   TOTAL KNEE ARTHROPLASTY Right 01/03/2018   Procedure: RIGHT TOTAL KNEE ARTHROPLASTY;  Surgeon: Durene Romans, MD;  Location: [facility];  Service: Orthopedics;  Laterality: Right;   TRANSURETHRAL RESECTION OF PROSTATE  1992   TRANSURETHRAL RESECTION OF PROSTATE N/A 12/22/2012   Procedure: TRANSURETHRAL RESECTION OF THE PROSTATE WITH GYRUS (TURP);  Surgeon: Antony Haste, MD;  Location: [facility];  Service: Urology;  Laterality: N/A;    Social History   Tobacco Use  Smoking Status Former   Current packs/day: 0.00   Average packs/day: 1 pack/day for 5.0 years (5.0 ttl pk-yrs)   Types: Cigarettes   Start date: 07/01/1960   Quit date: 07/01/1965   Years since quitting: 58.3  Smokeless Tobacco Never    Social History   Substance and Sexual Activity  Alcohol Use No    Family History  Problem Relation Age of Onset   Asthma Mother    Heart disease Mother 65   Coronary artery disease Father    Heart disease Father    Cancer Sister 56       breast    Review of Systems: As noted in history of present illness.  All other systems were reviewed and are negative.  Physical Exam: There were no vitals taken for this visit. GENERAL:  Well appearing WM in NAD HEENT:  PERRL, EOMI, sclera are clear. Oropharynx is clear. NECK:  No jugular venous distention, carotid upstroke brisk and symmetric, no bruits, no thyromegaly or adenopathy LUNGS:  Clear to auscultation bilaterally CHEST:  Unremarkable HEART:  RRR,  PMI not displaced or sustained,S1 and S2 within normal limits, no S3, no S4: no clicks, no rubs,  no murmurs ABD:  Soft, nontender. BS +, no masses or bruits. No hepatomegaly, no splenomegaly EXT:  2 + pulses throughout, he is wearing leg braces bilaterally, 1+ Edema, no cyanosis no clubbing SKIN:  Warm and dry.  No rashes NEURO:  Alert and oriented x 3. Cranial nerves II through XII intact. PSYCH:  Cognitively intact    LABORATORY DATA: Lab Results  Component Value Date   WBC 4.2 08/01/2023   HGB 13.3 08/01/2023   HCT 39.9 08/01/2023   PLT 198 08/01/2023   GLUCOSE 117 (H) 08/01/2023  CHOL 181 08/01/2023   TRIG 71 08/01/2023   HDL 78 08/01/2023   LDLCALC 90 08/01/2023   ALT 25 08/01/2023   AST 24 08/01/2023   NA 140 08/01/2023   K 4.4 08/01/2023   CL 101 08/01/2023   CREATININE 0.75 (L) 08/01/2023   BUN 16 08/01/2023   CO2 27 08/01/2023   TSH 2.930 08/01/2023   PSA 0.97 10/28/2021   INR 1.05 06/07/2012   HGBA1C 6.1 09/28/2012   Labs from [facility] 10/15/15: Normal CBC and CMET.  Dated 03/31/17: normal CMET and CBC.  Dated 10/12/17: sodium 130 otherwise CMET normal. CBC normal. 10/11/18: normal CMET and CBC  Echo 09/09/23: IMPRESSIONS     1. Left ventricular ejection fraction, by estimation, is 55 to 60%. Left  ventricular ejection fraction by PLAX is 56 %. The left ventricle has  normal function. The left ventricle has no regional wall motion  abnormalities. Left ventricular diastolic  parameters are consistent with Grade I diastolic dysfunction (impaired  relaxation). The average left ventricular global longitudinal strain is  19.8 %. The global longitudinal strain is normal.   2. Right ventricular systolic function is normal. The right ventricular  size is normal.   3. The mitral valve is grossly normal. No evidence of mitral valve  regurgitation.   4. The aortic valve is tricuspid. Aortic valve regurgitation is not  visualized.   Comparison(s): No prior Echocardiogram.        Assessment / Plan: 1. Coronary disease status post CABG in 2004. His last nuclear  stress test in May of 2011 was normal. We will continue with his medical management and risk factor modification. He is asymptomatic.   2. Hyperlipidemia on statin. LDL90 - we added Zetia. Will repeat lab 3 months.  3. PolyNeuropathy.  4. Leg edema and SOB. Recent evaluation suggests this is not cardiac. For now would not add diuretic since this may aggravate his imbalance. I suspect his edema is more related to his neuropathy.

## 2023-10-06 DIAGNOSIS — H43813 Vitreous degeneration, bilateral: Secondary | ICD-10-CM | POA: Diagnosis not present

## 2023-10-06 DIAGNOSIS — H3562 Retinal hemorrhage, left eye: Secondary | ICD-10-CM | POA: Diagnosis not present

## 2023-10-06 DIAGNOSIS — H353221 Exudative age-related macular degeneration, left eye, with active choroidal neovascularization: Secondary | ICD-10-CM | POA: Diagnosis not present

## 2023-10-06 DIAGNOSIS — H353132 Nonexudative age-related macular degeneration, bilateral, intermediate dry stage: Secondary | ICD-10-CM | POA: Diagnosis not present

## 2023-10-06 DIAGNOSIS — H43393 Other vitreous opacities, bilateral: Secondary | ICD-10-CM | POA: Diagnosis not present

## 2023-10-11 ENCOUNTER — Ambulatory Visit: Payer: Medicare HMO | Attending: Cardiology | Admitting: Cardiology

## 2023-10-11 ENCOUNTER — Encounter: Payer: Self-pay | Admitting: Cardiology

## 2023-10-11 VITALS — BP 148/78 | HR 73 | Ht 72.0 in | Wt 189.0 lb

## 2023-10-11 DIAGNOSIS — I25810 Atherosclerosis of coronary artery bypass graft(s) without angina pectoris: Secondary | ICD-10-CM | POA: Diagnosis not present

## 2023-10-11 DIAGNOSIS — R609 Edema, unspecified: Secondary | ICD-10-CM

## 2023-10-11 DIAGNOSIS — E785 Hyperlipidemia, unspecified: Secondary | ICD-10-CM | POA: Diagnosis not present

## 2023-10-11 NOTE — Patient Instructions (Signed)

## 2023-10-17 DIAGNOSIS — D472 Monoclonal gammopathy: Secondary | ICD-10-CM | POA: Diagnosis not present

## 2023-10-24 ENCOUNTER — Ambulatory Visit: Payer: Medicare HMO | Admitting: Internal Medicine

## 2023-10-25 ENCOUNTER — Encounter: Payer: Self-pay | Admitting: Internal Medicine

## 2023-10-25 ENCOUNTER — Ambulatory Visit (INDEPENDENT_AMBULATORY_CARE_PROVIDER_SITE_OTHER): Payer: Medicare HMO | Admitting: Internal Medicine

## 2023-10-25 VITALS — BP 122/60 | HR 62 | Ht 72.0 in | Wt 188.2 lb

## 2023-10-25 DIAGNOSIS — E538 Deficiency of other specified B group vitamins: Secondary | ICD-10-CM

## 2023-10-25 DIAGNOSIS — M810 Age-related osteoporosis without current pathological fracture: Secondary | ICD-10-CM

## 2023-10-25 LAB — VITAMIN B12: Vitamin B-12: 1127 pg/mL — ABNORMAL HIGH (ref 200–1100)

## 2023-10-25 LAB — VITAMIN D 25 HYDROXY (VIT D DEFICIENCY, FRACTURES): Vit D, 25-Hydroxy: 55 ng/mL (ref 30–100)

## 2023-10-25 NOTE — Progress Notes (Addendum)
 Subjective:     Patient ID: Brandon Ray, male   DOB: 10/18/1934, 89 y.o.   MRN: [id number]  HPI Brandon Ray is a very pleasant 89 y.o. man with h/o severe polyneuropathy and monoclonal gammopathy, returning for f/u for primary hypogonadism, osteoporosis + h/o sacral fracture, low B12. Last visit 89 years ago.  He is here with his wife who helps him walk and also offers part of the history, especially relating to his falls and past medical history.  Interim history: No falls or fractures since last visit. He continues to have disequilibrium.  No orthostasis. He has pain in R hip and leg. MRI showed pinched nerve in the lower back - had a steroid injection before last visit >> pain resolved.  He did not have to have any more steroid injections. He has macular degeneration >> prev. On Avastin  >> now  Eyelea. No blurry vision.  Reviewed  history: He continues to have severe neuropathy. He also still has discomfort in the mid thoracic region, pelvic region and his hands.  He was previously investigated for POEMS sd. - prev. Seen by Dr Twana, then had an evaluation by Dr. Josiephine LABOR. Tuchman (Heme/Onc at [facility]) for this, but it was concluded that he actually has MGUS and will be just followed for it. He also had a fat pad biopsy to rule out amyloidosis and this was negative.   Please see my previous notes regarding patient's past medical history. He does have polyneuropathy, monoclonal gammopathy, gynecomastia and primary hypogonadism, but no organomegaly or significant skin changes. He also sees Dr. Arlana in pain clinic,  Dr Bertina (neurology) at the [facility]. Dr. Harland Prose is his local neurologist ([facility]). 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   He was on Neurontin  high doses but this was not helping >> seeing Pain Clinic at [facility] >> tried Cymbalta + Neurontin  >> did not help >> then Lyrica .  Clinical osteoporosis: Osteopenic BMD, however, with a sacral fracture likely  secondary to steroid treatment, age, and hypogonadism.    He had a fall in the park in 12/2020 >> L fibular fracture. He had 3 falls but no fragility fractures in 2023.  He did have an open fracture of the distal phalanx of the left mid finger on 09/14/2022 when visiting his son.  Review latest bone density ([place]) reports-T-scores improved: 11/04/2021  Lumbar spine L1-L4 (L2, L3) Femoral neck (FN) 33% distal radius  T-score  +2.1 RFN: -0.8 LFN: -0.2 n/a  Change in BMD from previous DXA test (%)  +8.6%* +2.2% n/a  (*) statistically significant  DXA 07/02/2019: L1-L4 T score of +1.1 (+6.7%*) and right femoral neck -1.1, left femoral neck -0.8 (mean femoral neck: +1.9%)  DXA 11/28/2016: L1-L4 T score of +0.4 (+3.3%*) and right femoral neck -1.3, left femoral neck -0.8 (mean femoral neck: +3.0%)  DXA 11/10/2014: L1-L4 T score of +0.1 and right femoral neck -1.4, left femoral neck -1.2 DXA 11/06/2012: L1-L4 T score of -0.9 and dual femur T score of -1.1  We started Prolia  on 01/07/2015.  Subsequent doses: 07/10/2015 01/28/2016 08/03/2016 01/31/2017 08/04/2017 03/02/2018 09/05/2018 03/15/2019 09/18/2019 03/25/2020 10/17/2020 04/24/2021 10/28/2021 05/04/2022 12/24/2022 06/21/2023  Kidney function and calcium  level normal: 10/17/2023:  Lab Results  Component Value Date   BUN 16 08/01/2023   Lab Results  Component Value Date   CREATININE 0.75 (L) 08/01/2023   Latest vitamin D  levels were normal: Lab Results  Component Value Date   VD25OH 61.14 10/21/2022  VD25OH 60.84 10/28/2021   VD25OH 28.6 (L) 10/17/2020   VD25OH 67.06 10/19/2019   VD25OH 91.84 10/17/2018   VD25OH 62.54 10/19/2017   VD25OH 58.28 10/19/2016   VD25OH 54.39 10/20/2015   VD25OH 64.27 04/16/2015   VD25OH 56.34 10/04/2014   On 5000 units vitamin D  daily. He also takes 400 units vitamin D -600 mg calcium .  He has a history of low B12, but this normalized: Lab Results  Component Value Date   VITAMINB12 752  10/21/2022   VITAMINB12 640 10/28/2021   VITAMINB12 933 (H) 10/17/2020   VITAMINB12 >1500 (H) 10/19/2019   VITAMINB12 1,251 (H) 10/17/2018   VITAMINB12 1,309 (H) 10/19/2017   VITAMINB12 >1500 (H) 01/31/2017   VITAMINB12 225 10/19/2016  At last visit, he was on B12 p.o. 5000 mcg every other day, then twice a week. He continues on phenobarbital  since 1961.   Other problems previously addressed: Primary hypogonadism: -We initially started him on testosterone  gel 5 g daily, then we increased to 7.5 g daily.  He could not tolerate this dose due to increased libido, pelvic tension, breast tension.   -We then decrease the dose to 5 g 5 out of 7 days, however, he did stop this in 06/2019.  Due to weakness, he was interested in restarting it - approved by his insurance. -he restarted the testosterone  but then stopped in 06/2020 around the time of his skin surgery but again he was interested in restarting it.  Therefore, we did not check his testosterone  level in 2022. -we then restarted on testosterone  gel 5 g 5 out of 7 days, however, he came off afterwards and lower abdominal pain was better.  Reviewed pertinent labs: Component     Latest Ref Rng & Units 10/28/2021  Testosterone      264 - 916 ng/dL 457  Testosterone  Free     6.6 - 18.1 pg/mL 5.2 (L)  Sex Horm Binding Glob, Serum     19.3 - 76.4 nmol/L 78.0 (H)   Component     Latest Ref Rng & Units 10/19/2019  Testosterone , Serum (Total)     ng/dL 24 (L)  % Free Testosterone      % 0.6  Free Testosterone , S     pg/mL 1.4 (L)  Sex Hormone Binding Globulin     nmol/L 100.7 (H)  PSA     0.10 - 4.00 ng/mL 0.10   Component     Latest Ref Rng & Units 10/17/2018  Testosterone , Serum (Total)     ng/dL 522  % Free Testosterone      % 0.8  Free Testosterone , S     pg/mL 38 (L)  Sex Hormone Binding Globulin     nmol/L 79.6 (H)   Component     Latest Ref Rng & Units 10/19/2017  Testosterone , Serum (Total)     ng/dL 325  % Free  Testosterone      % 1.1  Free Testosterone , S     pg/mL 74  Sex Hormone Binding Globulin     nmol/L 80.9 (H)   Component     Latest Ref Rng & Units 10/20/2015 10/19/2016  Testosterone      264 - 916 ng/dL 748 137  Sex Horm Binding Glob, Serum     19.3 - 76.4 nmol/L 55 71.5  Testosterone  Free     6.6 - 18.1 pg/mL 34.6 (L) 10.3  Testosterone -% Free     1.6 - 2.9 % 1.4 (L)    Reviewed PSA levels: Lab Results  Component Value Date  PSA 0.97 10/28/2021   PSA 0.10 10/19/2019   PSA 0.47 10/19/2017   Reviewed his recent hemoglobin/hematocrit: 10/17/2023:  08/01/2023: 13.3/39.9  He sees his urologist on a regular basis and has DRE's every year >> normal reportedly.  He also has a history of gynecomastia, with normal mammograms.  This is likely secondary to his primary hypogonadism.  It has not improved after starting testosterone .  Prolactin level was normal.He also has dysesthesia at the level of his nipples, probably related to his neuropathy.  This improved on Lyrica .  She has a history of being on high-dose steroids for his neuropathy-initially started at 60 mg in 07/1999>> stopped prednisone 09/2013.  Review of Systems + see HPI + fatigue Musculoskeletal: + muscle aches/no joint aches Neurological: no tremors/+ numbness/+ tingling/no dizziness  I reviewed pt's medications, allergies, PMH, social hx, family hx, and changes were documented in the history of present illness. Otherwise, unchanged from my initial visit note.  Past Medical History:  Diagnosis Date   Arthritis    BPH (benign prostatic hypertrophy) 2014   Coronary artery disease 01/2003   CABG   Free monoclonal light chain    urine   History of blood transfusion 2004   Hyperlipidemia    on medication   Macular degeneration    both eyes   Peripheral neuropathy 05/2011    follow up with Dr. Jeanetta multiple issues   Pleural effusion 05/2011   from abdominal surgery   Seizures (HCC) 1950's   last one 1961    Small bowel obstruction (HCC) 05/2011   due to adhesion   Weakness 2013-May began   lower  from neuropathy from knees down   Past Surgical History:  Procedure Laterality Date   ABDOMINAL SURGERY  05/2011   bowel obstruction   ANKLE FRACTURE SURGERY  1997   left   CARDIAC CATHETERIZATION  01/27/2003   NORMAL LEFT VENTRICULAR SIZE WITH MILD FOCAL LATERAL WALL HYPOKINESIA. EF 60%   CORONARY ARTERY BYPASS GRAFT  2004   LIMA GRAFT TO THE LAD, SAPHENOUS VEIN GRAFT TO THE DIAGONAL, SAPHENOUS VEIN GRAFT TO THE FIRST OM, SAPHENOUS VEIN GRAFT TO THE PDA   HAND SURGERY  2000   left   TOTAL KNEE ARTHROPLASTY Right 01/03/2018   Procedure: RIGHT TOTAL KNEE ARTHROPLASTY;  Surgeon: Ernie Cough, MD;  Location: [facility];  Service: Orthopedics;  Laterality: Right;   TRANSURETHRAL RESECTION OF PROSTATE  1992   TRANSURETHRAL RESECTION OF PROSTATE N/A 12/22/2012   Procedure: TRANSURETHRAL RESECTION OF THE PROSTATE WITH GYRUS (TURP);  Surgeon: Cough Gwenyth Brooks, MD;  [facility];  Service: Urology;  Laterality: N/A;   Social History   Socioeconomic History   Marital status: Married    Spouse name: Not on file   Number of children: 4   Years of education: Not on file   Highest education level: Not on file  Occupational History   Occupation: production designer, theatre/television/film    Comment: retired, [employer] manager firm.   Tobacco Use   Smoking status: Former    Current packs/day: 0.00    Average packs/day: 1 pack/day for 5.0 years (5.0 ttl pk-yrs)    Types: Cigarettes    Start date: 07/01/1960    Quit date: 07/01/1965    Years since quitting: 58.3   Smokeless tobacco: Never  Vaping Use   Vaping status: Never Used  Substance and Sexual Activity   Alcohol use: No   Drug use: No   Sexual activity: Not Currently  Comment: MARRIED  Other Topics Concern   Not on file  Social History Narrative   Not on file   Social Drivers of Health   Financial Resource Strain: Not on file  Food  Insecurity: Not on file  Transportation Needs: Not on file  Physical Activity: Not on file  Stress: Not on file  Social Connections: Not on file  Intimate Partner Violence: Not on file   Current Outpatient Medications on File Prior to Visit  Medication Sig Dispense Refill   aspirin  81 MG EC tablet Take by mouth.     atorvastatin  (LIPITOR) 10 MG tablet Take one (1) tablet by mouth ( 10 mg ) 5 times weekly. 75 tablet 3   calcium -vitamin D  (OSCAL WITH D) 500-200 MG-UNIT per tablet Take 1 tablet by mouth daily at 2 PM.     Cholecalciferol (VITAMIN D -3) 5000 units TABS Take 5,000 Units by mouth every evening. Take 5000 IU 5 x week     Cyanocobalamin  (B-12) 2500 MCG TABS Take by mouth.     ezetimibe  (ZETIA ) 10 MG tablet Take 1 tablet (10 mg total) by mouth daily. 90 tablet 3   Multiple Vitamins-Minerals (PRESERVISION/LUTEIN) CAPS Take by mouth 2 (two) times daily.     Omega-3 Fatty Acids (FISH OIL ) 1200 MG CAPS Take 1,200 mg by mouth at bedtime.      PHENobarbital  (LUMINAL) 97.2 MG tablet Take 97.2 mg by mouth at bedtime.     polyethylene glycol (MIRALAX  / GLYCOLAX ) 17 g packet Take 17 g by mouth daily. Pt uses as neded.     pregabalin  (LYRICA ) 150 MG capsule Take 150 mg by mouth 2 (two) times daily.     tamsulosin  (FLOMAX ) 0.4 MG CAPS capsule Take 0.4 mg by mouth at bedtime.      Current Facility-Administered Medications on File Prior to Visit  Medication Dose Route Frequency Provider Last Rate Last Admin   denosumab  (PROLIA ) injection 60 mg  60 mg Subcutaneous Q6 months Trixie File, MD       Allergies  Allergen Reactions   Amoxicillin Anaphylaxis and Rash    Upper torso only. Has patient had a PCN reaction causing immediate rash, facial/tongue/throat swelling, SOB or lightheadedness with hypotension: No Has patient had a PCN reaction causing severe rash involving mucus membranes or skin necrosis: No Has patient had a PCN reaction that required hospitalization: No Has patient had a  PCN reaction occurring within the last 10 years: Yes If all of the above answers are NO, then may proceed with Cephalosporin use.    Tetanus Toxoids Other (See Comments)    TDP vaccine- family stated this caused neuropathy.   Boostrix [Tetanus-Diphth-Acell Pertussis] Other (See Comments)    Neuropathy symptoms   Diphth-Acell Pertussis-Tetanus    Other Other (See Comments)    Flu shot-neuropathy symptoms   Family History  Problem Relation Age of Onset   Asthma Mother    Heart disease Mother 74   Coronary artery disease Father    Heart disease Father    Cancer Sister 27       breast    Objective:   Physical Exam BP 122/60   Pulse 62   Ht 6' (1.829 m)   Wt 188 lb 3.2 oz (85.4 kg)   SpO2 99%   BMI 25.52 kg/m   Wt Readings from Last 3 Encounters:  10/25/23 188 lb 3.2 oz (85.4 kg)  10/11/23 189 lb (85.7 kg)  08/01/23 193 lb 6.4 oz (87.7 kg)   Constitutional:  overweight, in NAD, walks with a walker Eyes:  EOMI, no exophthalmos ENT: no neck masses, no cervical lymphadenopathy Cardiovascular: RRR, No MRG Respiratory: CTA B Musculoskeletal: no deformities Skin:no rashes Neurological: no tremor with outstretched hands  Assessment:     Osteoporosis - h/o sacral fracture, right big toe fracture (04/2013), spine X-ray: no vb fx - Off steroids now - on adequate Ca+vit D - On Prolia , no side effects  2. Low vitamin B12  3.   Primary hypogonadism - + gynecomastia bilaterally (per mammogram) + testicular atrophy - He is on testosterone  gel >> a sensation of pressure in abdomen, but this did not subside after stopping testosterone  gel for 1 mo after his surgery for Transurethral resection of prostate residual growth in 12/22/2012.  - He is not interested in restarting testosterone  as his lower abdominal pain felt better after stopping it.  4. Polyneuropathy - unlikely POEMS syndrome per evaluation by Hem/Onc at [facility] - amyloidosis ruled out by negative fat pad biopsy -  managed by Dr. Bertina at [facility] and Dr. Maudie Prose at [facility]   5. Long-term use of steroids - previously on prednisone 60 mg starting at the end of 2013 - Was able to taper >> now off since 09/2013  6.  MGUS - monoclonal gammopathy - now managed by Dr.Tuchman at [facility], previously Dr. Twana  Plan:     Osteoporosis  -Patient with history of osteopenia on DXA scans but with a history of sacral and fibular fracture, giving him a diagnosis of clinical osteoporosis -He has been on prednisone and Dilantin  in the past, but now off.  He also was on testosterone  replacement but he was off at last visit and was not interested in restarting it -Before our last visit, he had a fibular fracture.  This is the first fracture he had while on Prolia .  We discussed that 1 fracture did not necessarily imply Prolia  failure.  He also had a finger fracture, which is not considered a fragility fracture.  He had 3 falls before last visit but no fragility fractures with these.  He is normally using a walker but at the time of his fractures, he had a cane.  No fractures since last visit, but he had 1 fall after bending to pick up something from the cloths basket. -Reviewing his bone density report from 06/2019, this showed a stable BMD at the hips and improved T-scores at the spine.  Reviewing his bone density report from 10/2021, it appears that the bone density improved significantly at the level of the spine with nonsignificant improvement at the femoral necks.  He is now due for another bone density scan, which we will order today.  We discussed that stable or improving T-scores are desirable. -He continues on Prolia , which we started in 2016.  He is not missing injections.  He has been regularly, every 6 months.  No side effects from Prolia .  He has no jaw pain or new hip/thigh pain.  He previously had hip pain but it was determined that this was due to a pinched nerve in his lower back.  He had a steroid  injection before last visit and the pain resolved.   -At today's visit we discussed about continuing Prolia  for now.  Latest studies show that even continuing beyond 10 years is beneficial. -He continues on 5000 units vitamin D  daily and we will recheck the level today. -He continues calcium  600 mg daily.  Latest kidney function and calcium  level were  normal last month per review of Care everywhere records. -At today's visit we also discussed about resuming weightbearing exercises, which he stopped at the time of an upper respiratory infection before Christmas -I will see him back in 1 year  3.  Low vitamin B12 -He continues on 5000 mcg B12 twice a week -12 level was normal at last check -Will recheck this today  Orders Placed This Encounter  Procedures   DG Bone Density   Vitamin D , 25-hydroxy   Vitamin B12   Component     Latest Ref Rng 10/25/2023  Vitamin B12     200 - 1,100 pg/mL 1,127 (H)   Vitamin D , 25-Hydroxy     30 - 100 ng/mL 55   Vitamin D  level is normal, while B12 level is at the upper limit of the target range.  Would recommend to reduce the dose of B12 to only once a week.  Lela Fendt, MD PhD [facility] Endocrinology

## 2023-10-25 NOTE — Patient Instructions (Addendum)
 Please stop at the lab.  Continue the same B12 and D vitamin supplements.  Continue Prolia .  Start weight bearing exercises.  Please call and schedule bone density scan at the [facility] Office: [phone].   Please come back for a follow-up appointment in 1 year.

## 2023-11-01 DIAGNOSIS — L03031 Cellulitis of right toe: Secondary | ICD-10-CM | POA: Diagnosis not present

## 2023-11-01 DIAGNOSIS — S90411A Abrasion, right great toe, initial encounter: Secondary | ICD-10-CM | POA: Diagnosis not present

## 2023-11-08 ENCOUNTER — Ambulatory Visit (INDEPENDENT_AMBULATORY_CARE_PROVIDER_SITE_OTHER)
Admission: RE | Admit: 2023-11-08 | Discharge: 2023-11-08 | Disposition: A | Discharge status: Home / Self Care | Payer: Medicare HMO | Source: Ambulatory Visit | Attending: Internal Medicine | Admitting: Internal Medicine

## 2023-11-08 DIAGNOSIS — M810 Age-related osteoporosis without current pathological fracture: Secondary | ICD-10-CM

## 2023-11-10 ENCOUNTER — Encounter: Payer: Self-pay | Admitting: Internal Medicine

## 2023-11-19 NOTE — Telephone Encounter (Signed)
 Prolia VOB initiated via [email or web address]  Next Prolia inj DUE: 12/21/23

## 2023-11-23 NOTE — Telephone Encounter (Signed)
 Medical [employer] and [employer]te St[employer] - Prior Authorization REQUIRED

## 2023-12-01 DIAGNOSIS — H353132 Nonexudative age-related macular degeneration, bilateral, intermediate dry stage: Secondary | ICD-10-CM | POA: Diagnosis not present

## 2023-12-01 DIAGNOSIS — H43393 Other vitreous opacities, bilateral: Secondary | ICD-10-CM | POA: Diagnosis not present

## 2023-12-01 DIAGNOSIS — H353221 Exudative age-related macular degeneration, left eye, with active choroidal neovascularization: Secondary | ICD-10-CM | POA: Diagnosis not present

## 2023-12-01 DIAGNOSIS — H43813 Vitreous degeneration, bilateral: Secondary | ICD-10-CM | POA: Diagnosis not present

## 2023-12-01 DIAGNOSIS — H3562 Retinal hemorrhage, left eye: Secondary | ICD-10-CM | POA: Diagnosis not present

## 2023-12-01 NOTE — Telephone Encounter (Signed)
 Patient is ready for scheduling on or after 12/21/23 BUY AND BILL  Out-of-pocket cost due at time of visit: $356.87  Primary: [employer] Medicare Adv PPO Prolia co-insurance: 20% (approximately $331.87) Admin fee co-insurance: 20% (approximately $25)  Deductible: does not apply  Prior Auth: APPROVED PA# [id number] Valid: 06/11/23-06/10/24  Secondary: N/A Prolia co-insurance:  Admin fee co-insurance:  Deductible:  Prior Auth:  PA# Valid:   ** This summary of benefits is an estimation of the patient's out-of-pocket cost. Exact cost may vary based on individual plan coverage.

## 2023-12-01 NOTE — Telephone Encounter (Signed)
 Medical [employer] and [employer]te Stable  Prior Authorization for [employer] APPROVED PA# [id number] Valid: 06/11/23-06/10/24

## 2023-12-21 ENCOUNTER — Ambulatory Visit

## 2023-12-21 DIAGNOSIS — M810 Age-related osteoporosis without current pathological fracture: Secondary | ICD-10-CM | POA: Diagnosis not present

## 2023-12-21 MED ORDER — DENOSUMAB 60 MG/ML ~~LOC~~ SOSY
60.0000 mg | PREFILLED_SYRINGE | Freq: Once | SUBCUTANEOUS | Status: AC
  Administered 2023-12-21: 60 mg via SUBCUTANEOUS

## 2023-12-21 MED ORDER — DENOSUMAB 60 MG/ML ~~LOC~~ SOSY
60.0000 mg | PREFILLED_SYRINGE | Freq: Once | SUBCUTANEOUS | Status: AC
  Administered 2024-07-06: 60 mg via SUBCUTANEOUS

## 2023-12-21 NOTE — Progress Notes (Signed)
After obtaining consent, and per orders of Dr. Elvera Lennox, injection of Prolia given by Pollie Meyer. Patient instructed to remain in clinic for 20 minutes afterwards, and to report any adverse reaction to me immediately.

## 2023-12-25 ENCOUNTER — Other Ambulatory Visit: Payer: Self-pay | Admitting: Nurse Practitioner

## 2024-01-05 DIAGNOSIS — B351 Tinea unguium: Secondary | ICD-10-CM | POA: Diagnosis not present

## 2024-01-05 DIAGNOSIS — S91201A Unspecified open wound of right great toe with damage to nail, initial encounter: Secondary | ICD-10-CM | POA: Diagnosis not present

## 2024-01-16 ENCOUNTER — Ambulatory Visit: Admitting: Podiatry

## 2024-01-16 DIAGNOSIS — M79674 Pain in right toe(s): Secondary | ICD-10-CM

## 2024-01-16 DIAGNOSIS — M79675 Pain in left toe(s): Secondary | ICD-10-CM

## 2024-01-16 DIAGNOSIS — B351 Tinea unguium: Secondary | ICD-10-CM | POA: Diagnosis not present

## 2024-01-16 DIAGNOSIS — L97511 Non-pressure chronic ulcer of other part of right foot limited to breakdown of skin: Secondary | ICD-10-CM

## 2024-01-16 MED ORDER — TAVABOROLE 5 % EX SOLN: 1.0000 [drp] | Freq: Every day | CUTANEOUS | 2 refills | Status: AC

## 2024-01-16 NOTE — Progress Notes (Unsigned)
 Vautht between chair leg 10 days ago.  Went to urgent care Uisng vaseline on it now and wrap

## 2024-01-20 NOTE — Telephone Encounter (Signed)
 Last Prolia inj 12/21/23 Next Prolia inj due 06/22/24

## 2024-01-24 ENCOUNTER — Ambulatory Visit: Admitting: Podiatry

## 2024-01-24 ENCOUNTER — Encounter: Payer: Self-pay | Admitting: Podiatry

## 2024-01-24 DIAGNOSIS — B351 Tinea unguium: Secondary | ICD-10-CM | POA: Diagnosis not present

## 2024-01-24 DIAGNOSIS — L97511 Non-pressure chronic ulcer of other part of right foot limited to breakdown of skin: Secondary | ICD-10-CM

## 2024-01-25 NOTE — Progress Notes (Signed)
 Subjective: Chief Complaint  Patient presents with   Foot Ulcer    RM#11 Right foot big toe  ulcer follow up slight redness patient stated when washing area yesterday started to bleed.Patient states no one called about his last medication so never used it.    89 year old male presents the office his wife for evaluation of wound on the right big toe after the nail was removed.  He states that he was doing well in the area of his dry however skin came off last night his wife noticed bleeding although not significant.  Still has some redness to the area but no red streaks.  No significant pain and overall seems to be improving.  No fevers or chills.  He did not receive the topical medication for nail fungus with her toenails.  Objective: AAO x3, NAD DP/PT pulses palpable bilaterally, CRT less than 3 seconds Granular wound still present the right hallux today.  There is no active bleeding.  There is no purulence.  This is localized edema and erythema but overall toe appears to be improving.  There is no ascending cellulitis.  There is no fluctuation or crepitation but there is no odor. Remainder the nails are hypertrophic, dystrophic with yellow-brown discoloration. No pain with calf compression, swelling, warmth, erythema  Assessment: Onychomycosis; nail removal with wound right hallux  Plan: -All treatment options discussed with the patient including all alternatives, risks, complications.  -Overall the wound seems to be doing better.  Watch for any signs or symptoms of infection.  Should there be any increase in drainage recommend antibiotic ointment dressing changes during the day and leave the area open at nighttime.  Recommend keeping the area clean and dry.  Overall the wound is doing better and it continues to improve.  ABIs been ordered previously and still awaiting the results of this. Monitor for any clinical signs or symptoms of infection and directed to call the office immediately  should any occur or go to the ER.  I did offer get x-rays today but he wanted to hold off. -We will follow-up to see why he has not received the topical medication for nail fungus. -Patient encouraged to call the office with any questions, concerns, change in symptoms.   Charity Conch DPM

## 2024-01-26 ENCOUNTER — Ambulatory Visit: Admitting: Podiatry

## 2024-01-26 DIAGNOSIS — H3562 Retinal hemorrhage, left eye: Secondary | ICD-10-CM | POA: Diagnosis not present

## 2024-01-26 DIAGNOSIS — H43813 Vitreous degeneration, bilateral: Secondary | ICD-10-CM | POA: Diagnosis not present

## 2024-01-26 DIAGNOSIS — H532 Diplopia: Secondary | ICD-10-CM | POA: Diagnosis not present

## 2024-01-26 DIAGNOSIS — H353132 Nonexudative age-related macular degeneration, bilateral, intermediate dry stage: Secondary | ICD-10-CM | POA: Diagnosis not present

## 2024-01-26 DIAGNOSIS — H43393 Other vitreous opacities, bilateral: Secondary | ICD-10-CM | POA: Diagnosis not present

## 2024-01-26 DIAGNOSIS — H353221 Exudative age-related macular degeneration, left eye, with active choroidal neovascularization: Secondary | ICD-10-CM | POA: Diagnosis not present

## 2024-01-31 DIAGNOSIS — H43813 Vitreous degeneration, bilateral: Secondary | ICD-10-CM | POA: Diagnosis not present

## 2024-01-31 DIAGNOSIS — H04123 Dry eye syndrome of bilateral lacrimal glands: Secondary | ICD-10-CM | POA: Diagnosis not present

## 2024-01-31 DIAGNOSIS — H524 Presbyopia: Secondary | ICD-10-CM | POA: Diagnosis not present

## 2024-01-31 DIAGNOSIS — H353221 Exudative age-related macular degeneration, left eye, with active choroidal neovascularization: Secondary | ICD-10-CM | POA: Diagnosis not present

## 2024-01-31 DIAGNOSIS — H26493 Other secondary cataract, bilateral: Secondary | ICD-10-CM | POA: Diagnosis not present

## 2024-01-31 DIAGNOSIS — H5213 Myopia, bilateral: Secondary | ICD-10-CM | POA: Diagnosis not present

## 2024-02-10 ENCOUNTER — Ambulatory Visit: Payer: Self-pay | Admitting: Podiatry

## 2024-02-10 ENCOUNTER — Ambulatory Visit (HOSPITAL_COMMUNITY)
Admission: RE | Admit: 2024-02-10 | Discharge: 2024-02-10 | Disposition: A | Discharge status: Home / Self Care | Source: Ambulatory Visit | Attending: Podiatry | Admitting: Podiatry

## 2024-02-10 DIAGNOSIS — L97511 Non-pressure chronic ulcer of other part of right foot limited to breakdown of skin: Secondary | ICD-10-CM | POA: Diagnosis not present

## 2024-02-10 LAB — VAS US ABI WITH/WO TBI
Left ABI: 1.97
Right ABI: 1.57

## 2024-02-20 ENCOUNTER — Encounter: Payer: Self-pay | Admitting: Internal Medicine

## 2024-02-23 ENCOUNTER — Ambulatory Visit: Admitting: Podiatry

## 2024-02-23 DIAGNOSIS — L97511 Non-pressure chronic ulcer of other part of right foot limited to breakdown of skin: Secondary | ICD-10-CM

## 2024-02-23 DIAGNOSIS — B351 Tinea unguium: Secondary | ICD-10-CM

## 2024-02-23 NOTE — Progress Notes (Signed)
 Subjective: Chief Complaint  Patient presents with   Nail Problem    Rm 13 Patient is here to follow-up on test results from [facility]. Right hallux toe nail is missing for last 5 wks. No pain in right hallux. Patient is unsure which toe nails should be treated with antifungal cream and should the treatment last the recommended 48 weeks?     89 year old male presents the office his wife for evaluation of wound on the right big toe after the nail was removed.  States the area has dried up and so they discontinued but any kind of medication on this.  Denies any drainage or pus or increasing swelling or redness.  No fevers or chills.  He did have his arterial studies performed.   Objective: AAO x3, NAD DP/PT pulses palpable bilaterally, CRT less than 3 seconds Along the hallux with the nails removed appears that is dry, callused tissue present with very minimal dried blood present in the central aspect there is no open lesion identified at this time.  There is no surrounding erythema, ascending cellulitis.  No fluctuation or crepitation.  There is no malodor. No pain with calf compression, swelling, warmth, erythema  Assessment: Onychomycosis; nail removal with wound right hallux  Plan: -All treatment options discussed with the patient including all alternatives, risks, complications.  -Arterial studies reviewed.  There is send the wound is healed but the area is quite dry.  Debrided some loose tissue without any complications or bleeding today.  I still keep the area bandaged with him wearing shoes and socks but otherwise can leave the area open at nighttime.  I expect the dry skin to start to come off on its own.  Will need to monitor the nail as it grows back in. -Continue topical medication for nail fungus.  Charity Conch DPM

## 2024-02-23 NOTE — Patient Instructions (Signed)
 [facility] - [place], [place] - [address] [place] [address] Phone: [phone]  Fax: [phone]

## 2024-03-22 ENCOUNTER — Ambulatory Visit: Admitting: Podiatry

## 2024-03-26 ENCOUNTER — Telehealth: Payer: Self-pay | Admitting: Cardiology

## 2024-03-26 NOTE — Telephone Encounter (Signed)
 Left message to call back

## 2024-03-26 NOTE — Telephone Encounter (Signed)
 Pt c/o swelling/edema: STAT if pt has developed SOB within 24 hours  If swelling, where is the swelling located? Legs/feet   How much weight have you gained and in what time span? Not sure   Have you gained 2 pounds in a day or 5 pounds in a week? Not sure   Do you have a log of your daily weights (if so, list)? Has not taken   Are you currently taking a fluid pill? No   Are you currently SOB? No   Have you traveled recently in a car or plane for an extended period of time? No

## 2024-03-26 NOTE — Telephone Encounter (Signed)
 Spoke to patient he stated he has been having swelling in both lower legs and feet since his last appointment with Dr.Jordan 10/11/23.Stated he tripped and fell 2 weeks ago.Swelling worse since he fell.He does not weigh.He is alittle sob.He is not able to put shoes on.He does keep feet elevated when he is sitting.He is wearing compression hose which seems to be helping.He requested appointment in 1 month.Appointment scheduled with Dr.Jordan 8/12 at 1:20 pm.I will make Dr.Jordan aware.

## 2024-04-04 DIAGNOSIS — Z96651 Presence of right artificial knee joint: Secondary | ICD-10-CM | POA: Diagnosis not present

## 2024-04-04 DIAGNOSIS — H3562 Retinal hemorrhage, left eye: Secondary | ICD-10-CM | POA: Diagnosis not present

## 2024-04-04 DIAGNOSIS — H353221 Exudative age-related macular degeneration, left eye, with active choroidal neovascularization: Secondary | ICD-10-CM | POA: Diagnosis not present

## 2024-04-04 DIAGNOSIS — H353132 Nonexudative age-related macular degeneration, bilateral, intermediate dry stage: Secondary | ICD-10-CM | POA: Diagnosis not present

## 2024-04-04 DIAGNOSIS — H43813 Vitreous degeneration, bilateral: Secondary | ICD-10-CM | POA: Diagnosis not present

## 2024-04-04 DIAGNOSIS — H43393 Other vitreous opacities, bilateral: Secondary | ICD-10-CM | POA: Diagnosis not present

## 2024-04-04 DIAGNOSIS — M25561 Pain in right knee: Secondary | ICD-10-CM | POA: Diagnosis not present

## 2024-04-04 DIAGNOSIS — H532 Diplopia: Secondary | ICD-10-CM | POA: Diagnosis not present

## 2024-04-26 DIAGNOSIS — G629 Polyneuropathy, unspecified: Secondary | ICD-10-CM | POA: Diagnosis not present

## 2024-04-26 DIAGNOSIS — N400 Enlarged prostate without lower urinary tract symptoms: Secondary | ICD-10-CM | POA: Diagnosis not present

## 2024-04-26 DIAGNOSIS — G40909 Epilepsy, unspecified, not intractable, without status epilepticus: Secondary | ICD-10-CM | POA: Diagnosis not present

## 2024-04-26 DIAGNOSIS — M810 Age-related osteoporosis without current pathological fracture: Secondary | ICD-10-CM | POA: Diagnosis not present

## 2024-04-26 DIAGNOSIS — R531 Weakness: Secondary | ICD-10-CM | POA: Diagnosis not present

## 2024-04-26 DIAGNOSIS — I2510 Atherosclerotic heart disease of native coronary artery without angina pectoris: Secondary | ICD-10-CM | POA: Diagnosis not present

## 2024-04-26 DIAGNOSIS — Z993 Dependence on wheelchair: Secondary | ICD-10-CM | POA: Diagnosis not present

## 2024-04-27 ENCOUNTER — Telehealth: Payer: Self-pay | Admitting: Cardiology

## 2024-04-27 DIAGNOSIS — G610 Guillain-Barre syndrome: Secondary | ICD-10-CM | POA: Diagnosis not present

## 2024-04-27 DIAGNOSIS — M6281 Muscle weakness (generalized): Secondary | ICD-10-CM | POA: Diagnosis not present

## 2024-04-27 DIAGNOSIS — G6181 Chronic inflammatory demyelinating polyneuritis: Secondary | ICD-10-CM | POA: Diagnosis not present

## 2024-04-27 DIAGNOSIS — M21371 Foot drop, right foot: Secondary | ICD-10-CM | POA: Diagnosis not present

## 2024-04-27 DIAGNOSIS — D472 Monoclonal gammopathy: Secondary | ICD-10-CM | POA: Diagnosis not present

## 2024-04-27 DIAGNOSIS — Z8669 Personal history of other diseases of the nervous system and sense organs: Secondary | ICD-10-CM | POA: Diagnosis not present

## 2024-04-27 DIAGNOSIS — M21372 Foot drop, left foot: Secondary | ICD-10-CM | POA: Diagnosis not present

## 2024-04-27 DIAGNOSIS — Z993 Dependence on wheelchair: Secondary | ICD-10-CM | POA: Diagnosis not present

## 2024-04-27 NOTE — Telephone Encounter (Signed)
 Pt c/o swelling: STAT is pt has developed SOB within 24 hours  How much weight have you gained and in what time span?  No   If swelling, where is the swelling located?  Feet   Are you currently taking a fluid pill?  No   Are you currently SOB?  No   Do you have a log of your daily weights (if so, list)?  No   Have you gained 3 pounds in a day or 5 pounds in a week?  No   Have you traveled recently?   Drove to [facility] this morning--70 miles there and back. Back at [facility] this afternoon.

## 2024-04-27 NOTE — Telephone Encounter (Signed)
 Called patient left message on personal voice mail to call back.

## 2024-04-30 ENCOUNTER — Encounter: Payer: Self-pay | Admitting: Cardiology

## 2024-04-30 MED ORDER — FUROSEMIDE 20 MG PO TABS: ORAL_TABLET | ORAL | 3 refills | Status: DC

## 2024-04-30 NOTE — Telephone Encounter (Signed)
 Pt returning nurse call

## 2024-04-30 NOTE — Telephone Encounter (Signed)
 Spoke to patient Dr.Jordan advised Lasix  20 mg every morning.Follow up appointment with Dr.Jordan scheduled 9/16 at 11:00 am.

## 2024-04-30 NOTE — Telephone Encounter (Signed)
 Already spoke to patient.See previous 8/11 telephone note.

## 2024-04-30 NOTE — Telephone Encounter (Signed)
 Called pt; and a nurse just spoke with him and sent RX and made appt.

## 2024-04-30 NOTE — Telephone Encounter (Signed)
 Per pt - having swelling bilaterally in both feet, ankles and goes up into leg.  He is not able to get his shoes or braces on and therefore can not walk.  He denies any other s/s at this time and is not currently taking a diuretic.  He saw his neurologist on Friday who was very concerned and states she would have no problem with whatever Dr Swaziland prescribed for this issue.  Advised I will forward this information to Dr Swaziland and his nurse for review and he will be called back ASAP.

## 2024-04-30 NOTE — Telephone Encounter (Signed)
 Patient has called back. Please advise

## 2024-05-01 ENCOUNTER — Ambulatory Visit: Admitting: Cardiology

## 2024-05-02 NOTE — Telephone Encounter (Signed)
 Spoke to patient Dr.Jordan's advice given.Appointment scheduled with Brandon Beauvais NP 8/28 at 2:45 pm.

## 2024-05-02 NOTE — Telephone Encounter (Signed)
 Spoke to patient he stated he is taking Lasix  20 mg daily.Stated lasix  is not working.Stated he is not urinating much.Swelling in lower legs and feet.No sob.He has not weighed,no scales.Advised I will send message to Dr.Jordan for advice.

## 2024-05-02 NOTE — Telephone Encounter (Signed)
 Patient is calling back to speak with the nurse. Please advise

## 2024-05-09 ENCOUNTER — Ambulatory Visit: Admitting: Internal Medicine

## 2024-05-09 DIAGNOSIS — K56699 Other intestinal obstruction unspecified as to partial versus complete obstruction: Secondary | ICD-10-CM | POA: Diagnosis not present

## 2024-05-09 DIAGNOSIS — L89609 Pressure ulcer of unspecified heel, unspecified stage: Secondary | ICD-10-CM | POA: Diagnosis not present

## 2024-05-10 DIAGNOSIS — N401 Enlarged prostate with lower urinary tract symptoms: Secondary | ICD-10-CM | POA: Diagnosis not present

## 2024-05-10 DIAGNOSIS — Z96651 Presence of right artificial knee joint: Secondary | ICD-10-CM | POA: Diagnosis not present

## 2024-05-10 DIAGNOSIS — R3912 Poor urinary stream: Secondary | ICD-10-CM | POA: Diagnosis not present

## 2024-05-10 DIAGNOSIS — M25461 Effusion, right knee: Secondary | ICD-10-CM | POA: Diagnosis not present

## 2024-05-10 DIAGNOSIS — Z87891 Personal history of nicotine dependence: Secondary | ICD-10-CM | POA: Diagnosis not present

## 2024-05-10 DIAGNOSIS — Z7982 Long term (current) use of aspirin: Secondary | ICD-10-CM | POA: Diagnosis not present

## 2024-05-10 DIAGNOSIS — G629 Polyneuropathy, unspecified: Secondary | ICD-10-CM | POA: Diagnosis not present

## 2024-05-10 DIAGNOSIS — G40909 Epilepsy, unspecified, not intractable, without status epilepticus: Secondary | ICD-10-CM | POA: Diagnosis not present

## 2024-05-10 DIAGNOSIS — Z9181 History of falling: Secondary | ICD-10-CM | POA: Diagnosis not present

## 2024-05-10 DIAGNOSIS — M810 Age-related osteoporosis without current pathological fracture: Secondary | ICD-10-CM | POA: Diagnosis not present

## 2024-05-10 DIAGNOSIS — I2510 Atherosclerotic heart disease of native coronary artery without angina pectoris: Secondary | ICD-10-CM | POA: Diagnosis not present

## 2024-05-14 NOTE — Progress Notes (Unsigned)
 Cardiology Clinic Note   Patient Name: Brandon Ray Date of Encounter: 05/17/2024  Primary Care Provider:  Cleotilde Planas, MD Primary Cardiologist:  Peter Swaziland, MD  Patient Profile    Brandon Ray 89 year old male presents to the clinic today for evaluation of his lower extremity swelling.  Past Medical History    Past Medical History:  Diagnosis Date   Arthritis    BPH (benign prostatic hypertrophy) 2014   Coronary artery disease 01/2003   CABG   Free monoclonal light chain    urine   History of blood transfusion 2004   Hyperlipidemia    on medication   Macular degeneration    both eyes   Peripheral neuropathy 05/2011    follow up with Dr. Jeanetta multiple issues   Pleural effusion 05/2011   from abdominal surgery   Seizures (HCC) 1950's   last one 1961   Small bowel obstruction (HCC) 05/2011   due to adhesion   Weakness 2013-May began   lower  from neuropathy from knees down   Past Surgical History:  Procedure Laterality Date   ABDOMINAL SURGERY  05/2011   bowel obstruction   ANKLE FRACTURE SURGERY  1997   left   CARDIAC CATHETERIZATION  01/27/2003   NORMAL LEFT VENTRICULAR SIZE WITH MILD FOCAL LATERAL WALL HYPOKINESIA. EF 60%   CORONARY ARTERY BYPASS GRAFT  2004   LIMA GRAFT TO THE LAD, SAPHENOUS VEIN GRAFT TO THE DIAGONAL, SAPHENOUS VEIN GRAFT TO THE FIRST OM, SAPHENOUS VEIN GRAFT TO THE PDA   HAND SURGERY  2000   left   TOTAL KNEE ARTHROPLASTY Right 01/03/2018   Procedure: RIGHT TOTAL KNEE ARTHROPLASTY;  Surgeon: Ernie Cough, MD;  Location: [facility];  Service: Orthopedics;  Laterality: Right;   TRANSURETHRAL RESECTION OF PROSTATE  1992   TRANSURETHRAL RESECTION OF PROSTATE N/A 12/22/2012   Procedure: TRANSURETHRAL RESECTION OF THE PROSTATE WITH GYRUS (TURP);  Surgeon: Cough Gwenyth Brooks, MD;  Location: [facility];  Service: Urology;  Laterality: N/A;    Allergies  Allergies  Allergen Reactions   Amoxicillin Anaphylaxis and  Rash    Upper torso only. Has patient had a PCN reaction causing immediate rash, facial/tongue/throat swelling, SOB or lightheadedness with hypotension: No Has patient had a PCN reaction causing severe rash involving mucus membranes or skin necrosis: No Has patient had a PCN reaction that required hospitalization: No Has patient had a PCN reaction occurring within the last 10 years: Yes If all of the above answers are NO, then may proceed with Cephalosporin use.    Tetanus Toxoids Other (See Comments)    TDP vaccine- family stated this caused neuropathy.   Boostrix [Tetanus-Diphth-Acell Pertussis] Other (See Comments)    Neuropathy symptoms   Diphth-Acell Pertussis-Tetanus    Other Other (See Comments)    Flu shot-neuropathy symptoms    History of Present Illness    Brandon Ray has a PMH of coronary artery disease status post CABG x 3, 2004 in the setting of NSTEMI, small bowel obstruction, endocrinopathy, seizure disorder, right sided weakness, generalized clonic tonic epilepsy, AKI, hyperlipidemia, tachycardia, right TKA, overweight, and macular degeneration.  He also has severe polyneuropathy which is felt to be related to a vaccine.  This severely limits his physical activity.  Echocardiogram 09/09/2023 showed LVEF of 55-60%, no regional wall motion abnormalities, G1 DD, and no valvular abnormalities.  He reported increased shortness of breath 1/18.  His symptoms resolved with conservative measures.  He was seen in  the office in April.  He noted dyspnea on exertion.  This is felt to be related to deconditioning.  He declined further workup at that time.  He was seen in follow-up by Dr. Swaziland 10/11/2023.  During that time he was noted to have continued lower extremity swelling.  He reported that he had a upper respiratory virus over the holidays but was recovered.  He contacted the nurse triage line on 05/02/2024.  He reported increased lower extremity swelling.  He noted that he  was taking furosemide  20 mg daily.  He did not feel that this was working well.  He was not urinating as much.  He noted swelling in his lower extremities.  He denied shortness of breath.  He denied weight gain but reported that he did not have scales.  He presents to the clinic today for evaluation.  He states he continues to be unable to get his shoes on.  He presents with his wife today.  He reports that for several days he was taking a double dose of furosemide  and noticed increased urination.  He reports that with 120 mg dose of furosemide  he does not notice much urine production.  He is unable to weigh himself.  He did have a fall recently and is no longer using his walker.  He has now using his wheelchair.  He has been sleeping in a recliner for approximately 3 years.  We reviewed the importance of low-sodium diet, fluid restriction, and elevating his lower extremities.  He does have lower extremity support stockings on today that are providing very light compression.  I will provide [place] support stocking sheet, order BMP, have him continue his Lasix  for 1 more week, begin chair exercises, elevate lower extremities when not active and plan follow-up as scheduled with Dr. Swaziland.  He denies chest pain, shortness of breath,  fatigue, palpitations, melena, hematuria, hemoptysis, diaphoresis, weakness, presyncope, syncope, orthopnea, and PND.    Home Medications    Prior to Admission medications   Medication Sig Start Date End Date Taking? Authorizing Provider  aspirin  81 MG EC tablet Take by mouth.    [provider]  atorvastatin  (LIPITOR) 10 MG tablet TAKE 1 TABLET BY MOUTH FIVE TIMES A WEEK 12/26/23   Swaziland, Peter M, MD  calcium -vitamin D  (OSCAL WITH D) 500-200 MG-UNIT per tablet Take 1 tablet by mouth daily at 2 PM.    [provider]  Cholecalciferol (VITAMIN D -3) 5000 units TABS Take 5,000 Units by mouth every evening. Take 5000 IU 5 x week    [provider]   Cyanocobalamin  (B-12) 2500 MCG TABS Take by mouth.    [provider]  ezetimibe  (ZETIA ) 10 MG tablet Take 1 tablet (10 mg total) by mouth daily. 08/04/23 11/02/23  Swaziland, Peter M, MD  furosemide  (LASIX ) 20 MG tablet Take 20 mg every morning 04/30/24   Jordan, Peter M, MD  Multiple Vitamins-Minerals (PRESERVISION/LUTEIN) CAPS Take by mouth 2 (two) times daily. 06/01/12   [provider]  Omega-3 Fatty Acids (FISH OIL ) 1200 MG CAPS Take 1,200 mg by mouth at bedtime.     [provider]  PHENobarbital  (LUMINAL) 97.2 MG tablet Take 97.2 mg by mouth at bedtime.    [provider]  polyethylene glycol (MIRALAX  / GLYCOLAX ) 17 g packet Take 17 g by mouth daily. Pt uses as neded.    [provider]  pregabalin  (LYRICA ) 150 MG capsule Take 150 mg by mouth 2 (two) times daily.  [provider]  tamsulosin  (FLOMAX ) 0.4 MG CAPS capsule Take 0.4 mg by mouth at bedtime.  09/21/14   [provider]  Tavaborole  (KERYDIN ) 5 % SOLN Apply 1 drop topically daily. Apply 1 drop to the toenail daily. 01/16/24   Gershon Donnice SAUNDERS, DPM    Family History    Family History  Problem Relation Age of Onset   Asthma Mother    Heart disease Mother 75   Coronary artery disease Father    Heart disease Father    Cancer Sister 54       breast   He indicated that his mother is deceased. He indicated that his father is deceased. He indicated that his sister is alive. He indicated that his brother is alive. He indicated that his maternal grandmother is deceased. He indicated that his maternal grandfather is deceased. He indicated that his paternal grandmother is deceased. He indicated that his paternal grandfather is deceased.  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   Tobacco Use   Smoking status: Former    Current packs/day: 0.00    Average packs/day: 1 pack/day for 5.0 years (5.0 ttl pk-yrs)    Types: Cigarettes    Start date: 07/01/1960    Quit date: 07/01/1965    Years since quitting: 58.9   Smokeless tobacco: Never  Vaping Use   Vaping status: Never Used  Substance and Sexual Activity   Alcohol use: No   Drug use: No   Sexual activity: Not Currently    Comment: MARRIED  Other Topics Concern   Not on file  Social History Narrative   Not on file   Social Drivers of Health   Financial Resource Strain: Not on file  Food Insecurity: Not on file  Transportation Needs: Not on file  Physical Activity: Not on file  Stress: Not on file  Social Connections: Not on file  Intimate Partner Violence: Not on file     Review of Systems    General:  No chills, fever, night sweats or weight changes.  Cardiovascular:  No chest pain, dyspnea on exertion, edema, orthopnea, palpitations, paroxysmal nocturnal dyspnea. Dermatological: No rash, lesions/masses Respiratory: No cough, dyspnea Urologic: No hematuria, dysuria Abdominal:   No nausea, vomiting, diarrhea, bright red blood per rectum, melena, or hematemesis Neurologic:  No visual changes, wkns, changes in mental status. All other systems reviewed and are otherwise negative except as noted above.  Physical Exam    VS:  BP (!) 118/52   Pulse 62   Ht 6' (1.829 m)   Wt 180 lb (81.6 kg)   SpO2 96%   BMI 24.41 kg/m  , BMI Body mass index is 24.41 kg/m. GEN: Well nourished, well developed, in no acute distress. HEENT: normal. Neck: Supple, no JVD, carotid bruits, or masses. Cardiac: RRR, no murmurs, rubs, or gallops. No clubbing, cyanosis, 1+ bilateral lower extremity edema to above ankle.  Radials/DP/PT 2+ and equal bilaterally.  Respiratory:  Respirations regular and unlabored, clear to auscultation bilaterally. GI: Soft,  nontender, nondistended, BS + x 4. MS: no deformity or atrophy. Skin: warm and dry, no rash. Neuro:  Strength and sensation are intact. Psych: Normal affect.  Accessory Clinical Findings    Recent Labs: 08/01/2023: ALT 25; BNP 51.8; BUN 16; Creatinine, Ser 0.75; Hemoglobin 13.3; Platelets 198; Potassium 4.4; Sodium 140; TSH 2.930   Recent Lipid Panel    Component Value Date/Time   CHOL 181 08/01/2023 1630   TRIG 71 08/01/2023 1630   HDL 78 08/01/2023 1630   CHOLHDL 2.3 08/01/2023 1630   CHOLHDL 2.4 09/06/2016 1508   VLDL 15 09/06/2016 1508   LDLCALC 90 08/01/2023 1630         ECG personally reviewed by me today- None today.    Echocardiogram 09/09/2023  IMPRESSIONS     1. Left ventricular ejection fraction, by estimation, is 55 to 60%. Left  ventricular ejection fraction by PLAX is 56 %. The left ventricle has  normal function. The left ventricle has no regional wall motion  abnormalities. Left ventricular diastolic  parameters are consistent with Grade I diastolic dysfunction (impaired  relaxation). The average left ventricular global longitudinal strain is  19.8 %. The global longitudinal strain is normal.   2. Right ventricular systolic function is normal. The right ventricular  size is normal.   3. The mitral valve is grossly normal. No evidence of mitral valve  regurgitation.   4. The aortic valve is tricuspid. Aortic valve regurgitation is not  visualized.   Comparison(s): No prior Echocardiogram.   FINDINGS   Left Ventricle: Left ventricular ejection fraction, by estimation, is 55  to 60%. Left ventricular ejection fraction by PLAX is 56 %. The left  ventricle has normal function. The left ventricle has no regional wall  motion abnormalities. The average left  ventricular global longitudinal strain is 19.8 %. The global longitudinal  strain is normal. The left ventricular internal cavity size was normal in  size. There is no left ventricular hypertrophy.  Left ventricular diastolic  parameters are consistent with  Grade I diastolic dysfunction (impaired relaxation). Indeterminate filling  pressures.   Right Ventricle: The right ventricular size is normal. No increase in  right ventricular wall thickness. Right ventricular systolic function is  normal.   Left Atrium: Left atrial size was normal in size.   Right Atrium: Right atrial size was normal in size.   Pericardium: There is no evidence of pericardial effusion.   Mitral Valve: The mitral valve is grossly normal. No evidence of mitral  valve regurgitation.   Tricuspid Valve: The tricuspid valve is grossly normal. Tricuspid valve  regurgitation is trivial.   Aortic Valve: The aortic valve is tricuspid. Aortic valve regurgitation is  not visualized.   Pulmonic Valve: The pulmonic valve was normal in structure. Pulmonic valve  regurgitation is not visualized.   Aorta: The aortic root and ascending aorta are structurally normal, with  no evidence of dilitation.   Venous: The inferior vena cava was not well visualized.   IAS/Shunts: No atrial level shunt detected by color flow Doppler.        Assessment & Plan   1.  Bilateral lower extremity edema-noted to have bilateral lower extremity 1+ pitting edema to above his ankle.  Previously conservative management was recommended.  It was felt that his lower extremity symptoms were related to primarily his neuropathy.  He was started on furosemide  and did not have much benefit from this.  He contacted nurse triage line 05/02/2024.  It appears that his lower extremity edema is related to neuropathy and has a component of dependent edema as well.  Echocardiogram previously reassuring.  Details above.  Unable to weigh daily.  Now wheelchair-bound. Heart healthy low-sodium diet Elevate lower extremities when not active Lower extremity support stockings-place in the morning when getting out of bed and remove upon getting in bed in the  evening. Continue furosemide  20 mg daily for the next 7 days Lower extremity support stockings-support stocking sheet given Elevate lower extremities when not active Chair exercises Order BMP   Coronary artery disease-he is status post CABG x 3 in 2004.  Denies exertional chest discomfort.  Fairly sedentary. Continue aspirin , atorvastatin , omega-3 fatty acids Heart healthy low-sodium diet Increase physical activity as tolerated  Hyperlipidemia-LDL 90 on 11/24. High-fiber diet Continue ezetimibe , aspirin , atorvastatin , omega-3 fatty acids Not fasting today.  Will have fasting labs at follow-up with Dr. Swaziland  Disposition: Follow-up with Dr. Swaziland as scheduled.  Josefa HERO. Marisha Renier NP-C     05/17/2024, 3:03 PM [facility] [address] [place], [place] [address] Office [phone]      I spent 14 minutes examining this patient, reviewing medications, and using patient centered shared decision making involving their cardiac care.   I spent  20 minutes reviewing past medical history,  medications, and prior cardiac tests.

## 2024-05-17 ENCOUNTER — Ambulatory Visit: Attending: Cardiology | Admitting: General Practice

## 2024-05-17 ENCOUNTER — Encounter: Payer: Self-pay | Admitting: General Practice

## 2024-05-17 VITALS — BP 118/52 | HR 62 | Temp 97.5°F | Resp 20 | Ht 72.0 in | Wt 180.0 lb

## 2024-05-17 DIAGNOSIS — I25810 Atherosclerosis of coronary artery bypass graft(s) without angina pectoris: Secondary | ICD-10-CM | POA: Diagnosis not present

## 2024-05-17 DIAGNOSIS — R609 Edema, unspecified: Secondary | ICD-10-CM | POA: Diagnosis not present

## 2024-05-17 DIAGNOSIS — E785 Hyperlipidemia, unspecified: Secondary | ICD-10-CM

## 2024-05-17 NOTE — Patient Instructions (Addendum)
 Medication Instructions:  Continue Lasix  20 mg for 1 more week then stop *If you need a refill on your cardiac medications before your next appointment, please call your pharmacy*  Lab Work: BMP today If you have labs (blood work) drawn today and your tests are completely normal, you will receive your results only by: MyChart Message (if you have MyChart) OR A paper copy in the mail If you have any lab test that is abnormal or we need to change your treatment, we will call you to review the results.  Testing/Procedures: None ordered today.  Follow-Up: At [facility], you and your health needs are our priority.  As part of our continuing mission to provide you with exceptional heart care, our providers are all part of one team.  This team includes your primary Cardiologist (physician) and Advanced Practice Providers or APPs (Physician Assistants and Nurse Practitioners) who all work together to provide you with the care you need, when you need it.  Your next appointment:   As scheduled with Dr. Swaziland   We recommend signing up for the patient portal called MyChart.  Sign up information is provided on this After Visit Summary.  MyChart is used to connect with patients for Virtual Visits (Telemedicine).  Patients are able to view lab/test results, encounter notes, upcoming appointments, etc.  Non-urgent messages can be sent to your provider as well.   To learn more about what you can do with MyChart, go to [email or web address].   Other Instructions Please follow fluid restriction 30 to 40 ounces daily     PLEASE READ AND FOLLOW ATTACHED  [facility]  [employer]  Outlet Store  Training and development officer for [employer] Mailing Address:  [address];   [address]  [place], [place] [address]  Tel [phone] Fx [phone]     High Quality Legwear for Today's Active Lifestyles Maximum Compression at the ankle. Compression lessens gradually up the leg.    We manufacture a wide range of compression hosiery for men and women in  different styles, constructions and levels of support.  How Compression Hosiery Works Regulatory affairs officer, [employer]. compression hosiery works by applying graduated pressure to the  muscles and veins in the legs.  When the calf muscle contracts such as during walking  the compression hosiery will "give" and then return to its original position. By doing so  the hosiery is assists your body's circulatory wellness.  The result is increased leg health and vitality.   Maximum Compression at the ankle Compression lessens gradually up the leg  We Offer: Sheer & Opaque Stockings       COLORS:  Nude, black, white and misc. prints Below Knee Thigh High Pantyhose  High Quality Legwear for Today's Active Lifestyles We manufacture a wide range of compression hosiery for men and women in different styles, construction sand levels of support.  Socks:                     Sheer & Opaque   Compression Levels Include:                                  Stockings Men's               Below Knee                8-15 mmHg   Women's  Thigh High                 15-20 mmHg  Unisex             Pantyhose                 20-30 mmHg                                                                      30-40 mmHg  4 Simple Ways to Order   Email  [email or web address] Mail/Email orders are subject to processing and handling charges. Allow 7-10 days for receipt.  Phone [phone]  Please allow 24 hours for return call.   In Person  We recommend calling prior to your visit to confirm store hours as they may change due to holiday, weather, and maintenance.   By Mail When placing an order, please have the following information available. Our representatives are available to assist.     Measurements    THIGH      in.   CALF        in.   ANKLE     in.    Compression  8-15 mmHg 15-20 mmHg**   20-30 mmHg 30-40 mmHg   WOMEN'S MEN'S   Shoe Size Sock Size Shoe Size Sock Size  4 - 5 Small 7.5 and Under Small  5.5 - 7.5 Medium 8 - 10 Medium  8 - 10 Large 10.5 - 12 Large  10.5 and Over X-Large 12.5 and Over X-Large   Knee High Size Chart  Length from CALF MEASUREMENT  floor to bend   in knee. 11 12 13 14 15 16 17 18 19 20  21" 22"  14 S S S S M M L L L XL XL XL  15 S S S M M L L L XL XL XXL XXL  16 S S M M M L L L XL XXL XXL XXL  17 S M M M M L L XL XL XXL XXL XXL  18 M M M M L L L XL XL XXL XXL XXL  19 M M M M L L XL XL XL XXL XXL XXL   Thigh High Circumference Sizing Chart                 S M L XL XXL  ANKLE 6.5 - 8 8 - 9.5 9.5 - 11 11 - 12.5 12.5 - 14  CALF 10.5 - 14.5 11.5 - 15.5 12.5 - 17 13.5 - 17.5 14.5 - 19.5  THIGH 15.5 - 22 17.5 - 24 19.5 - 26 22 - 28 26 - 32  HIP UP TO 40 UP TO 44 UP TO 48' UP TO 52 UP TO 56   Pantyhose Size Chart  Height Petite Medium Tall X-Tall Queen Queen +   Weight Weight Weight Weight Weight Weight  4'11 95-130 135      5'0 95-125 130-145   170-185   5'1 90-120 125-155 160-165  170-195   5'2 90-115 120-145 150-165  170-195   5'3 90-110 115-140 145-165  170-200 200-225  5'4 100-105 110-135 140-160 165 170-200 200-225  5'5 100 105-130 135-160 165 170-200 200-225  5'6  110-125  130-155 160-165 170-200 200-225  5'7  110-120 125-150 155-165 170-200 195-225  5'8   120-145 150-165 170-200 190-225  5'9   125-140 145-170 175-190 185-220  5'10   125-135 140-185  185-215  5'11   130-135 140-185  190-210    Your provider would like for you to do chair exercises

## 2024-05-18 ENCOUNTER — Ambulatory Visit: Payer: Self-pay | Admitting: General Practice

## 2024-05-18 DIAGNOSIS — G40909 Epilepsy, unspecified, not intractable, without status epilepticus: Secondary | ICD-10-CM | POA: Diagnosis not present

## 2024-05-18 DIAGNOSIS — M810 Age-related osteoporosis without current pathological fracture: Secondary | ICD-10-CM | POA: Diagnosis not present

## 2024-05-18 DIAGNOSIS — Z9181 History of falling: Secondary | ICD-10-CM | POA: Diagnosis not present

## 2024-05-18 DIAGNOSIS — Z7982 Long term (current) use of aspirin: Secondary | ICD-10-CM | POA: Diagnosis not present

## 2024-05-18 DIAGNOSIS — Z87891 Personal history of nicotine dependence: Secondary | ICD-10-CM | POA: Diagnosis not present

## 2024-05-18 DIAGNOSIS — G629 Polyneuropathy, unspecified: Secondary | ICD-10-CM | POA: Diagnosis not present

## 2024-05-18 DIAGNOSIS — Z96651 Presence of right artificial knee joint: Secondary | ICD-10-CM | POA: Diagnosis not present

## 2024-05-18 DIAGNOSIS — I2510 Atherosclerotic heart disease of native coronary artery without angina pectoris: Secondary | ICD-10-CM | POA: Diagnosis not present

## 2024-05-18 DIAGNOSIS — M25461 Effusion, right knee: Secondary | ICD-10-CM | POA: Diagnosis not present

## 2024-05-18 DIAGNOSIS — R3912 Poor urinary stream: Secondary | ICD-10-CM | POA: Diagnosis not present

## 2024-05-18 LAB — BASIC METABOLIC PANEL WITH GFR
BUN/Creatinine Ratio: 25 — ABNORMAL HIGH (ref 10–24)
BUN: 17 mg/dL (ref 8–27)
CO2: 25 mmol/L (ref 20–29)
Calcium: 9.4 mg/dL (ref 8.6–10.2)
Chloride: 95 mmol/L — ABNORMAL LOW (ref 96–106)
Creatinine, Ser: 0.67 mg/dL — ABNORMAL LOW (ref 0.76–1.27)
Glucose: 86 mg/dL (ref 70–99)
Potassium: 4.9 mmol/L (ref 3.5–5.2)
Sodium: 134 mmol/L (ref 134–144)
eGFR: 91 mL/min/1.73 (ref >59)

## 2024-05-22 DIAGNOSIS — M25512 Pain in left shoulder: Secondary | ICD-10-CM | POA: Diagnosis not present

## 2024-05-24 DIAGNOSIS — Z9181 History of falling: Secondary | ICD-10-CM | POA: Diagnosis not present

## 2024-05-24 DIAGNOSIS — Z7982 Long term (current) use of aspirin: Secondary | ICD-10-CM | POA: Diagnosis not present

## 2024-05-24 DIAGNOSIS — I2510 Atherosclerotic heart disease of native coronary artery without angina pectoris: Secondary | ICD-10-CM | POA: Diagnosis not present

## 2024-05-24 DIAGNOSIS — Z96651 Presence of right artificial knee joint: Secondary | ICD-10-CM | POA: Diagnosis not present

## 2024-05-24 DIAGNOSIS — Z87891 Personal history of nicotine dependence: Secondary | ICD-10-CM | POA: Diagnosis not present

## 2024-05-24 DIAGNOSIS — R3912 Poor urinary stream: Secondary | ICD-10-CM | POA: Diagnosis not present

## 2024-05-24 DIAGNOSIS — M810 Age-related osteoporosis without current pathological fracture: Secondary | ICD-10-CM | POA: Diagnosis not present

## 2024-05-24 DIAGNOSIS — N401 Enlarged prostate with lower urinary tract symptoms: Secondary | ICD-10-CM | POA: Diagnosis not present

## 2024-05-24 DIAGNOSIS — G40909 Epilepsy, unspecified, not intractable, without status epilepticus: Secondary | ICD-10-CM | POA: Diagnosis not present

## 2024-05-24 DIAGNOSIS — M25461 Effusion, right knee: Secondary | ICD-10-CM | POA: Diagnosis not present

## 2024-05-25 DIAGNOSIS — M25461 Effusion, right knee: Secondary | ICD-10-CM | POA: Diagnosis not present

## 2024-05-25 DIAGNOSIS — N401 Enlarged prostate with lower urinary tract symptoms: Secondary | ICD-10-CM | POA: Diagnosis not present

## 2024-05-25 DIAGNOSIS — I2510 Atherosclerotic heart disease of native coronary artery without angina pectoris: Secondary | ICD-10-CM | POA: Diagnosis not present

## 2024-05-25 DIAGNOSIS — Z87891 Personal history of nicotine dependence: Secondary | ICD-10-CM | POA: Diagnosis not present

## 2024-05-25 DIAGNOSIS — R3912 Poor urinary stream: Secondary | ICD-10-CM | POA: Diagnosis not present

## 2024-05-25 DIAGNOSIS — M810 Age-related osteoporosis without current pathological fracture: Secondary | ICD-10-CM | POA: Diagnosis not present

## 2024-05-25 DIAGNOSIS — Z96651 Presence of right artificial knee joint: Secondary | ICD-10-CM | POA: Diagnosis not present

## 2024-05-25 DIAGNOSIS — G629 Polyneuropathy, unspecified: Secondary | ICD-10-CM | POA: Diagnosis not present

## 2024-05-25 DIAGNOSIS — G40909 Epilepsy, unspecified, not intractable, without status epilepticus: Secondary | ICD-10-CM | POA: Diagnosis not present

## 2024-05-25 DIAGNOSIS — Z7982 Long term (current) use of aspirin: Secondary | ICD-10-CM | POA: Diagnosis not present

## 2024-05-25 DIAGNOSIS — Z9181 History of falling: Secondary | ICD-10-CM | POA: Diagnosis not present

## 2024-05-29 DIAGNOSIS — M25461 Effusion, right knee: Secondary | ICD-10-CM | POA: Diagnosis not present

## 2024-05-29 DIAGNOSIS — N401 Enlarged prostate with lower urinary tract symptoms: Secondary | ICD-10-CM | POA: Diagnosis not present

## 2024-05-29 DIAGNOSIS — G629 Polyneuropathy, unspecified: Secondary | ICD-10-CM | POA: Diagnosis not present

## 2024-05-29 DIAGNOSIS — M810 Age-related osteoporosis without current pathological fracture: Secondary | ICD-10-CM | POA: Diagnosis not present

## 2024-05-29 DIAGNOSIS — I2510 Atherosclerotic heart disease of native coronary artery without angina pectoris: Secondary | ICD-10-CM | POA: Diagnosis not present

## 2024-05-29 DIAGNOSIS — Z7982 Long term (current) use of aspirin: Secondary | ICD-10-CM | POA: Diagnosis not present

## 2024-05-29 DIAGNOSIS — R3912 Poor urinary stream: Secondary | ICD-10-CM | POA: Diagnosis not present

## 2024-05-29 DIAGNOSIS — G40909 Epilepsy, unspecified, not intractable, without status epilepticus: Secondary | ICD-10-CM | POA: Diagnosis not present

## 2024-05-29 DIAGNOSIS — Z9181 History of falling: Secondary | ICD-10-CM | POA: Diagnosis not present

## 2024-05-29 DIAGNOSIS — Z96651 Presence of right artificial knee joint: Secondary | ICD-10-CM | POA: Diagnosis not present

## 2024-05-29 DIAGNOSIS — Z87891 Personal history of nicotine dependence: Secondary | ICD-10-CM | POA: Diagnosis not present

## 2024-05-30 DIAGNOSIS — Z96651 Presence of right artificial knee joint: Secondary | ICD-10-CM | POA: Diagnosis not present

## 2024-05-31 NOTE — Progress Notes (Signed)
 Brandon Ray Date of Birth: April 15, 1935   History of Present Illness: Brandon Ray is seen today for followup leg swelling. He has a history of coronary disease and is status post CABG in 2004 after a NSTEMI. He had severe 3 vessel disease and normal LV function. 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 This has significantly limited his activity.  He has had some edema over the past year but this became worse over the past week. Since he started using compression hose this has improved. He did have SBO in January 2018 that resolved with conservative measures.  He was seen in our office in April. Noted some DOE that he felt was due to deconditioning. Declined further work up at that time.   When last seen noted LE edema and SOB. Neuropathy is still a major problem. He does wear compression hose. Labs were done and were normal including BNP. Echo was normal.   He is seen today for follow up. Legs are much better with compression wraps.Daughter is a Engineer, civil (consulting) and showed him how to do wraps. Still some swelling in feet. Hasn't filled Rx for compression hose yet. Sleeps in recliner. Watching salt and fluid intake.     Current Outpatient Medications on File Prior to Visit  Medication Sig Dispense Refill   aspirin  81 MG EC tablet Take by mouth.     atorvastatin  (LIPITOR) 10 MG tablet TAKE 1 TABLET BY MOUTH FIVE TIMES A WEEK 90 tablet 3   calcium -vitamin D  (OSCAL WITH D) 500-200 MG-UNIT per tablet Take 1 tablet by mouth daily at 2 PM.     Cholecalciferol (VITAMIN D -3) 5000 units TABS Take 5,000 Units by mouth every evening. Take 5000 IU 5 x week     Cyanocobalamin  (B-12) 2500 MCG TABS Take by mouth.     ezetimibe  (ZETIA ) 10 MG tablet Take 1 tablet (10 mg total) by mouth daily. 90 tablet 3   furosemide  (LASIX ) 20 MG tablet Take 20 mg every morning 90 tablet 3   Multiple Vitamins-Minerals (PRESERVISION/LUTEIN) CAPS Take by mouth 2 (two) times daily.     Omega-3 Fatty Acids  (FISH OIL ) 1200 MG CAPS Take 1,200 mg by mouth at bedtime.      PHENobarbital  (LUMINAL) 97.2 MG tablet Take 97.2 mg by mouth at bedtime.     polyethylene glycol (MIRALAX  / GLYCOLAX ) 17 g packet Take 17 g by mouth daily. Pt uses as neded.     pregabalin  (LYRICA ) 150 MG capsule Take 150 mg by mouth 2 (two) times daily.     tamsulosin  (FLOMAX ) 0.4 MG CAPS capsule Take 0.4 mg by mouth at bedtime.      Tavaborole  (KERYDIN ) 5 % SOLN Apply 1 drop topically daily. Apply 1 drop to the toenail daily. 10 mL 2   Current Facility-Administered Medications on File Prior to Visit  Medication Dose Route Frequency Provider Last Rate Last Admin   denosumab  (PROLIA ) injection 60 mg  60 mg Subcutaneous Q6 months Trixie File, MD       NOREEN ON 06/21/2024] denosumab  (PROLIA ) injection 60 mg  60 mg Subcutaneous Once Gherghe, Cristina, MD        Allergies  Allergen Reactions   Amoxicillin Anaphylaxis and Rash    Upper torso only. Has patient had a PCN reaction causing immediate rash, facial/tongue/throat swelling, SOB or lightheadedness with hypotension: No Has patient had a PCN reaction causing severe rash involving mucus membranes or skin necrosis: No Has patient had a PCN  reaction that required hospitalization: No Has patient had a PCN reaction occurring within the last 10 years: Yes If all of the above answers are NO, then may proceed with Cephalosporin use.    Tetanus Toxoid-Containing Vaccines Other (See Comments)    TDP vaccine- family stated this caused neuropathy.   Boostrix [Tetanus-Diphth-Acell Pertussis] Other (See Comments)    Neuropathy symptoms   Diphth-Acell Pertussis-Tetanus    Other Other (See Comments)    Flu shot-neuropathy symptoms    Past Medical History:  Diagnosis Date   Arthritis    BPH (benign prostatic hypertrophy) 2014   Coronary artery disease 01/2003   CABG   Free monoclonal light chain    urine   History of blood transfusion 2004   Hyperlipidemia    on  medication   Macular degeneration    both eyes   Peripheral neuropathy 05/2011    follow up with Dr. Jeanetta multiple issues   Pleural effusion 05/2011   from abdominal surgery   Seizures (HCC) 1950's   last one 1961   Small bowel obstruction (HCC) 05/2011   due to adhesion   Weakness 2013-May began   lower  from neuropathy from knees down    Past Surgical History:  Procedure Laterality Date   ABDOMINAL SURGERY  05/2011   bowel obstruction   ANKLE FRACTURE SURGERY  1997   left   CARDIAC CATHETERIZATION  01/27/2003   NORMAL LEFT VENTRICULAR SIZE WITH MILD FOCAL LATERAL WALL HYPOKINESIA. EF 60%   CORONARY ARTERY BYPASS GRAFT  2004   LIMA GRAFT TO THE LAD, SAPHENOUS VEIN GRAFT TO THE DIAGONAL, SAPHENOUS VEIN GRAFT TO THE FIRST OM, SAPHENOUS VEIN GRAFT TO THE PDA   HAND SURGERY  2000   left   TOTAL KNEE ARTHROPLASTY Right 01/03/2018   Procedure: RIGHT TOTAL KNEE ARTHROPLASTY;  Surgeon: Ernie Cough, MD;  Location: [facility];  Service: Orthopedics;  Laterality: Right;   TRANSURETHRAL RESECTION OF PROSTATE  1992   TRANSURETHRAL RESECTION OF PROSTATE N/A 12/22/2012   Procedure: TRANSURETHRAL RESECTION OF THE PROSTATE WITH GYRUS (TURP);  Surgeon: Cough Gwenyth Brooks, MD;  Location: [facility];  Service: Urology;  Laterality: N/A;    Social History   Tobacco Use  Smoking Status Former   Current packs/day: 0.00   Average packs/day: 1 pack/day for 5.0 years (5.0 ttl pk-yrs)   Types: Cigarettes   Start date: 07/01/1960   Quit date: 07/01/1965   Years since quitting: 58.9  Smokeless Tobacco Never    Social History   Substance and Sexual Activity  Alcohol Use No    Family History  Problem Relation Age of Onset   Asthma Mother    Heart disease Mother 32   Coronary artery disease Father    Heart disease Father    Cancer Sister 71       breast    Review of Systems: As noted in history of present illness.  All other systems were reviewed and are  negative.  Physical Exam: BP 114/66 (BP Location: Left Arm, Patient Position: Sitting, Cuff Size: Normal)   Pulse 71   Ht 6' 0.05 (1.83 m)   Wt 180 lb (81.6 kg)   SpO2 93%   BMI 24.38 kg/m  GENERAL:  Well appearing WM in NAD HEENT:  PERRL, EOMI, sclera are clear. Oropharynx is clear. NECK:  No jugular venous distention, carotid upstroke brisk and symmetric, no bruits, no thyromegaly or adenopathy LUNGS:  Clear to auscultation bilaterally CHEST:  Unremarkable HEART:  RRR,  PMI not displaced or sustained,S1 and S2 within normal limits, no S3, no S4: no clicks, no rubs, no murmurs ABD:  Soft, nontender. BS +, no masses or bruits. No hepatomegaly, no splenomegaly EXT:  2 + pulses throughout, mild pedal edema. no cyanosis no clubbing SKIN:  Warm and dry.  No rashes NEURO:  Alert and oriented x 3. Cranial nerves II through XII intact. PSYCH:  Cognitively intact    LABORATORY DATA: Lab Results  Component Value Date   WBC 4.2 08/01/2023   HGB 13.3 08/01/2023   HCT 39.9 08/01/2023   PLT 198 08/01/2023   GLUCOSE 86 05/17/2024   CHOL 181 08/01/2023   TRIG 71 08/01/2023   HDL 78 08/01/2023   LDLCALC 90 08/01/2023   ALT 25 08/01/2023   AST 24 08/01/2023   NA 134 05/17/2024   K 4.9 05/17/2024   CL 95 (L) 05/17/2024   CREATININE 0.67 (L) 05/17/2024   BUN 17 05/17/2024   CO2 25 05/17/2024   TSH 2.930 08/01/2023   PSA 0.97 10/28/2021   INR 1.05 06/07/2012   HGBA1C 6.1 09/28/2012   Labs from [facility] 10/15/15: Normal CBC and CMET.  Dated 03/31/17: normal CMET and CBC.  Dated 10/12/17: sodium 130 otherwise CMET normal. CBC normal. 10/11/18: normal CMET and CBC  Echo 09/09/23: IMPRESSIONS     1. Left ventricular ejection fraction, by estimation, is 55 to 60%. Left  ventricular ejection fraction by PLAX is 56 %. The left ventricle has  normal function. The left ventricle has no regional wall motion  abnormalities. Left ventricular diastolic  parameters are consistent with Grade I  diastolic dysfunction (impaired  relaxation). The average left ventricular global longitudinal strain is  19.8 %. The global longitudinal strain is normal.   2. Right ventricular systolic function is normal. The right ventricular  size is normal.   3. The mitral valve is grossly normal. No evidence of mitral valve  regurgitation.   4. The aortic valve is tricuspid. Aortic valve regurgitation is not  visualized.   Comparison(s): No prior Echocardiogram.        Assessment / Plan: 1. Coronary disease status post CABG in 2004. His last nuclear stress test in May of 2011 was normal. We will continue with his medical management and risk factor modification. He is asymptomatic.   2. Hyperlipidemia on statin. LDL 90 - we added Zetia .   3. PolyNeuropathy.  4. Leg edema. evaluation suggests this is not cardiac. Mostly related to dependency and neuropathy. Has responded best to leg wraps. On maintenance lasix  dose. May try and transition from wraps to compression hose since swelling is so much better. Continue to try and elevate as much as possible.

## 2024-06-01 DIAGNOSIS — G40909 Epilepsy, unspecified, not intractable, without status epilepticus: Secondary | ICD-10-CM | POA: Diagnosis not present

## 2024-06-01 DIAGNOSIS — I2510 Atherosclerotic heart disease of native coronary artery without angina pectoris: Secondary | ICD-10-CM | POA: Diagnosis not present

## 2024-06-01 DIAGNOSIS — Z96651 Presence of right artificial knee joint: Secondary | ICD-10-CM | POA: Diagnosis not present

## 2024-06-01 DIAGNOSIS — Z7982 Long term (current) use of aspirin: Secondary | ICD-10-CM | POA: Diagnosis not present

## 2024-06-01 DIAGNOSIS — Z9181 History of falling: Secondary | ICD-10-CM | POA: Diagnosis not present

## 2024-06-01 DIAGNOSIS — M810 Age-related osteoporosis without current pathological fracture: Secondary | ICD-10-CM | POA: Diagnosis not present

## 2024-06-01 DIAGNOSIS — M25461 Effusion, right knee: Secondary | ICD-10-CM | POA: Diagnosis not present

## 2024-06-01 DIAGNOSIS — N401 Enlarged prostate with lower urinary tract symptoms: Secondary | ICD-10-CM | POA: Diagnosis not present

## 2024-06-01 DIAGNOSIS — Z87891 Personal history of nicotine dependence: Secondary | ICD-10-CM | POA: Diagnosis not present

## 2024-06-01 DIAGNOSIS — R3912 Poor urinary stream: Secondary | ICD-10-CM | POA: Diagnosis not present

## 2024-06-01 DIAGNOSIS — G629 Polyneuropathy, unspecified: Secondary | ICD-10-CM | POA: Diagnosis not present

## 2024-06-04 NOTE — Telephone Encounter (Signed)
 Prolia  VOB initiated via [email or web address]  Next Prolia  inj DUE: 06/22/24

## 2024-06-05 ENCOUNTER — Encounter: Payer: Self-pay | Admitting: Cardiology

## 2024-06-05 ENCOUNTER — Ambulatory Visit: Attending: Cardiology | Admitting: Cardiology

## 2024-06-05 VITALS — BP 114/66 | HR 71 | Ht 72.05 in | Wt 180.0 lb

## 2024-06-05 DIAGNOSIS — R609 Edema, unspecified: Secondary | ICD-10-CM | POA: Diagnosis not present

## 2024-06-05 DIAGNOSIS — E785 Hyperlipidemia, unspecified: Secondary | ICD-10-CM | POA: Diagnosis not present

## 2024-06-05 DIAGNOSIS — I25810 Atherosclerosis of coronary artery bypass graft(s) without angina pectoris: Secondary | ICD-10-CM | POA: Diagnosis not present

## 2024-06-05 MED ORDER — FUROSEMIDE 20 MG PO TABS: ORAL_TABLET | ORAL | 3 refills | Status: AC

## 2024-06-05 NOTE — Patient Instructions (Signed)

## 2024-06-05 NOTE — Addendum Note (Signed)
 Addended by: CHRISTIANNE CHANNING PARAS on: 06/05/2024 11:29 AM   Modules accepted: Orders

## 2024-06-06 DIAGNOSIS — H43813 Vitreous degeneration, bilateral: Secondary | ICD-10-CM | POA: Diagnosis not present

## 2024-06-06 DIAGNOSIS — H43393 Other vitreous opacities, bilateral: Secondary | ICD-10-CM | POA: Diagnosis not present

## 2024-06-06 DIAGNOSIS — H3562 Retinal hemorrhage, left eye: Secondary | ICD-10-CM | POA: Diagnosis not present

## 2024-06-06 DIAGNOSIS — H353221 Exudative age-related macular degeneration, left eye, with active choroidal neovascularization: Secondary | ICD-10-CM | POA: Diagnosis not present

## 2024-06-06 DIAGNOSIS — H532 Diplopia: Secondary | ICD-10-CM | POA: Diagnosis not present

## 2024-06-06 DIAGNOSIS — H353132 Nonexudative age-related macular degeneration, bilateral, intermediate dry stage: Secondary | ICD-10-CM | POA: Diagnosis not present

## 2024-06-07 DIAGNOSIS — Z9181 History of falling: Secondary | ICD-10-CM | POA: Diagnosis not present

## 2024-06-09 DIAGNOSIS — K56699 Other intestinal obstruction unspecified as to partial versus complete obstruction: Secondary | ICD-10-CM | POA: Diagnosis not present

## 2024-06-09 DIAGNOSIS — L89609 Pressure ulcer of unspecified heel, unspecified stage: Secondary | ICD-10-CM | POA: Diagnosis not present

## 2024-06-13 DIAGNOSIS — Z9181 History of falling: Secondary | ICD-10-CM | POA: Diagnosis not present

## 2024-06-13 DIAGNOSIS — Z96651 Presence of right artificial knee joint: Secondary | ICD-10-CM | POA: Diagnosis not present

## 2024-06-13 DIAGNOSIS — G40909 Epilepsy, unspecified, not intractable, without status epilepticus: Secondary | ICD-10-CM | POA: Diagnosis not present

## 2024-06-13 DIAGNOSIS — N401 Enlarged prostate with lower urinary tract symptoms: Secondary | ICD-10-CM | POA: Diagnosis not present

## 2024-06-13 DIAGNOSIS — G629 Polyneuropathy, unspecified: Secondary | ICD-10-CM | POA: Diagnosis not present

## 2024-06-13 DIAGNOSIS — Z87891 Personal history of nicotine dependence: Secondary | ICD-10-CM | POA: Diagnosis not present

## 2024-06-13 DIAGNOSIS — M25461 Effusion, right knee: Secondary | ICD-10-CM | POA: Diagnosis not present

## 2024-06-13 DIAGNOSIS — Z7982 Long term (current) use of aspirin: Secondary | ICD-10-CM | POA: Diagnosis not present

## 2024-06-13 DIAGNOSIS — M810 Age-related osteoporosis without current pathological fracture: Secondary | ICD-10-CM | POA: Diagnosis not present

## 2024-06-13 DIAGNOSIS — R3912 Poor urinary stream: Secondary | ICD-10-CM | POA: Diagnosis not present

## 2024-06-14 DIAGNOSIS — G40909 Epilepsy, unspecified, not intractable, without status epilepticus: Secondary | ICD-10-CM | POA: Diagnosis not present

## 2024-06-14 NOTE — Telephone Encounter (Signed)
 [employer] - Prior Authorization REQUIRED for [employer]

## 2024-06-15 DIAGNOSIS — I2510 Atherosclerotic heart disease of native coronary artery without angina pectoris: Secondary | ICD-10-CM | POA: Diagnosis not present

## 2024-06-15 DIAGNOSIS — N401 Enlarged prostate with lower urinary tract symptoms: Secondary | ICD-10-CM | POA: Diagnosis not present

## 2024-06-15 DIAGNOSIS — M25461 Effusion, right knee: Secondary | ICD-10-CM | POA: Diagnosis not present

## 2024-06-15 DIAGNOSIS — Z7982 Long term (current) use of aspirin: Secondary | ICD-10-CM | POA: Diagnosis not present

## 2024-06-15 DIAGNOSIS — R3912 Poor urinary stream: Secondary | ICD-10-CM | POA: Diagnosis not present

## 2024-06-15 DIAGNOSIS — M810 Age-related osteoporosis without current pathological fracture: Secondary | ICD-10-CM | POA: Diagnosis not present

## 2024-06-15 DIAGNOSIS — Z96651 Presence of right artificial knee joint: Secondary | ICD-10-CM | POA: Diagnosis not present

## 2024-06-15 DIAGNOSIS — G40909 Epilepsy, unspecified, not intractable, without status epilepticus: Secondary | ICD-10-CM | POA: Diagnosis not present

## 2024-06-15 DIAGNOSIS — Z87891 Personal history of nicotine dependence: Secondary | ICD-10-CM | POA: Diagnosis not present

## 2024-06-15 DIAGNOSIS — Z9181 History of falling: Secondary | ICD-10-CM | POA: Diagnosis not present

## 2024-06-15 DIAGNOSIS — G629 Polyneuropathy, unspecified: Secondary | ICD-10-CM | POA: Diagnosis not present

## 2024-06-21 DIAGNOSIS — R3912 Poor urinary stream: Secondary | ICD-10-CM | POA: Diagnosis not present

## 2024-06-21 DIAGNOSIS — Z7982 Long term (current) use of aspirin: Secondary | ICD-10-CM | POA: Diagnosis not present

## 2024-06-21 DIAGNOSIS — M810 Age-related osteoporosis without current pathological fracture: Secondary | ICD-10-CM | POA: Diagnosis not present

## 2024-06-21 DIAGNOSIS — Z9181 History of falling: Secondary | ICD-10-CM | POA: Diagnosis not present

## 2024-06-21 DIAGNOSIS — G629 Polyneuropathy, unspecified: Secondary | ICD-10-CM | POA: Diagnosis not present

## 2024-06-21 DIAGNOSIS — N401 Enlarged prostate with lower urinary tract symptoms: Secondary | ICD-10-CM | POA: Diagnosis not present

## 2024-06-21 DIAGNOSIS — I2510 Atherosclerotic heart disease of native coronary artery without angina pectoris: Secondary | ICD-10-CM | POA: Diagnosis not present

## 2024-06-21 DIAGNOSIS — Z87891 Personal history of nicotine dependence: Secondary | ICD-10-CM | POA: Diagnosis not present

## 2024-06-21 DIAGNOSIS — Z96651 Presence of right artificial knee joint: Secondary | ICD-10-CM | POA: Diagnosis not present

## 2024-06-21 DIAGNOSIS — M25461 Effusion, right knee: Secondary | ICD-10-CM | POA: Diagnosis not present

## 2024-06-21 DIAGNOSIS — G40909 Epilepsy, unspecified, not intractable, without status epilepticus: Secondary | ICD-10-CM | POA: Diagnosis not present

## 2024-06-21 NOTE — Telephone Encounter (Signed)
 Prior Authorization initiated for PROLIA  via [employer] Case ID: [id number]

## 2024-06-22 DIAGNOSIS — M25512 Pain in left shoulder: Secondary | ICD-10-CM | POA: Diagnosis not present

## 2024-06-26 NOTE — Telephone Encounter (Signed)
 [employer]  Patient is ready for scheduling on or after 06/22/24  Out-of-pocket cost due at time of visit: $356.87   Primary: [employer] Advantage  PPO Prolia  co-insurance: 20% (approximately $331.87) Admin fee co-insurance: 20% (approximately $25)  Deductible: does not apply  Prior Auth: APPROVED PA# [id number] Valid: 06/21/24-06/21/25  Secondary: N/A Prolia  co-insurance:  Admin fee co-insurance:  Deductible:  Prior Auth:  PA# Valid:   ** This summary of benefits is an estimation of the patient's out-of-pocket cost. Exact cost may vary based on individual plan coverage.

## 2024-06-26 NOTE — Telephone Encounter (Signed)
 [employer]  Prior Authorization for PROLIA  APPROVED PA# [id number] Valid: 06/21/24-06/21/25

## 2024-06-29 DIAGNOSIS — M25512 Pain in left shoulder: Secondary | ICD-10-CM | POA: Diagnosis not present

## 2024-07-04 DIAGNOSIS — G629 Polyneuropathy, unspecified: Secondary | ICD-10-CM | POA: Diagnosis not present

## 2024-07-04 DIAGNOSIS — R531 Weakness: Secondary | ICD-10-CM | POA: Diagnosis not present

## 2024-07-04 DIAGNOSIS — Z993 Dependence on wheelchair: Secondary | ICD-10-CM | POA: Diagnosis not present

## 2024-07-04 DIAGNOSIS — Z6825 Body mass index (BMI) 25.0-25.9, adult: Secondary | ICD-10-CM | POA: Diagnosis not present

## 2024-07-05 DIAGNOSIS — N401 Enlarged prostate with lower urinary tract symptoms: Secondary | ICD-10-CM | POA: Diagnosis not present

## 2024-07-05 DIAGNOSIS — Z96651 Presence of right artificial knee joint: Secondary | ICD-10-CM | POA: Diagnosis not present

## 2024-07-05 DIAGNOSIS — G629 Polyneuropathy, unspecified: Secondary | ICD-10-CM | POA: Diagnosis not present

## 2024-07-05 DIAGNOSIS — G40909 Epilepsy, unspecified, not intractable, without status epilepticus: Secondary | ICD-10-CM | POA: Diagnosis not present

## 2024-07-05 DIAGNOSIS — I2510 Atherosclerotic heart disease of native coronary artery without angina pectoris: Secondary | ICD-10-CM | POA: Diagnosis not present

## 2024-07-05 DIAGNOSIS — Z9181 History of falling: Secondary | ICD-10-CM | POA: Diagnosis not present

## 2024-07-05 DIAGNOSIS — M810 Age-related osteoporosis without current pathological fracture: Secondary | ICD-10-CM | POA: Diagnosis not present

## 2024-07-05 DIAGNOSIS — R3912 Poor urinary stream: Secondary | ICD-10-CM | POA: Diagnosis not present

## 2024-07-05 DIAGNOSIS — Z87891 Personal history of nicotine dependence: Secondary | ICD-10-CM | POA: Diagnosis not present

## 2024-07-05 DIAGNOSIS — M25461 Effusion, right knee: Secondary | ICD-10-CM | POA: Diagnosis not present

## 2024-07-06 ENCOUNTER — Ambulatory Visit

## 2024-07-06 DIAGNOSIS — Z87891 Personal history of nicotine dependence: Secondary | ICD-10-CM | POA: Diagnosis not present

## 2024-07-06 DIAGNOSIS — M25461 Effusion, right knee: Secondary | ICD-10-CM | POA: Diagnosis not present

## 2024-07-06 DIAGNOSIS — I2510 Atherosclerotic heart disease of native coronary artery without angina pectoris: Secondary | ICD-10-CM | POA: Diagnosis not present

## 2024-07-06 DIAGNOSIS — M810 Age-related osteoporosis without current pathological fracture: Secondary | ICD-10-CM | POA: Diagnosis not present

## 2024-07-06 DIAGNOSIS — G629 Polyneuropathy, unspecified: Secondary | ICD-10-CM | POA: Diagnosis not present

## 2024-07-06 DIAGNOSIS — G40909 Epilepsy, unspecified, not intractable, without status epilepticus: Secondary | ICD-10-CM | POA: Diagnosis not present

## 2024-07-06 DIAGNOSIS — Z9181 History of falling: Secondary | ICD-10-CM | POA: Diagnosis not present

## 2024-07-06 DIAGNOSIS — Z96651 Presence of right artificial knee joint: Secondary | ICD-10-CM | POA: Diagnosis not present

## 2024-07-06 DIAGNOSIS — Z7982 Long term (current) use of aspirin: Secondary | ICD-10-CM | POA: Diagnosis not present

## 2024-07-06 DIAGNOSIS — N401 Enlarged prostate with lower urinary tract symptoms: Secondary | ICD-10-CM | POA: Diagnosis not present

## 2024-07-06 DIAGNOSIS — R3912 Poor urinary stream: Secondary | ICD-10-CM | POA: Diagnosis not present

## 2024-07-06 MED ORDER — DENOSUMAB 60 MG/ML ~~LOC~~ SOSY: 60.0000 mg | PREFILLED_SYRINGE | Freq: Once | SUBCUTANEOUS | Status: AC

## 2024-07-06 NOTE — Progress Notes (Signed)
 Medication: Prolia  Dosage:60 mg Location: Right Arm Injection given ab:[id number] Provider: Lela Trixie COME

## 2024-07-08 NOTE — Telephone Encounter (Signed)
 Last Prolia  inj 07/06/24 Next Prolia  inj due 01/05/25

## 2024-07-09 DIAGNOSIS — K56699 Other intestinal obstruction unspecified as to partial versus complete obstruction: Secondary | ICD-10-CM | POA: Diagnosis not present

## 2024-07-09 DIAGNOSIS — L89609 Pressure ulcer of unspecified heel, unspecified stage: Secondary | ICD-10-CM | POA: Diagnosis not present

## 2024-07-24 DIAGNOSIS — M75102 Unspecified rotator cuff tear or rupture of left shoulder, not specified as traumatic: Secondary | ICD-10-CM | POA: Diagnosis not present

## 2024-07-24 DIAGNOSIS — M25512 Pain in left shoulder: Secondary | ICD-10-CM | POA: Diagnosis not present

## 2024-07-25 ENCOUNTER — Other Ambulatory Visit: Payer: Self-pay | Admitting: Cardiology

## 2024-07-30 DIAGNOSIS — L89609 Pressure ulcer of unspecified heel, unspecified stage: Secondary | ICD-10-CM | POA: Diagnosis not present

## 2024-07-30 DIAGNOSIS — K56699 Other intestinal obstruction unspecified as to partial versus complete obstruction: Secondary | ICD-10-CM | POA: Diagnosis not present

## 2024-08-03 DIAGNOSIS — M75102 Unspecified rotator cuff tear or rupture of left shoulder, not specified as traumatic: Secondary | ICD-10-CM | POA: Diagnosis not present

## 2024-08-03 DIAGNOSIS — M25512 Pain in left shoulder: Secondary | ICD-10-CM | POA: Diagnosis not present

## 2024-08-09 DIAGNOSIS — L89609 Pressure ulcer of unspecified heel, unspecified stage: Secondary | ICD-10-CM | POA: Diagnosis not present

## 2024-08-09 DIAGNOSIS — K56699 Other intestinal obstruction unspecified as to partial versus complete obstruction: Secondary | ICD-10-CM | POA: Diagnosis not present

## 2024-08-10 DIAGNOSIS — M75102 Unspecified rotator cuff tear or rupture of left shoulder, not specified as traumatic: Secondary | ICD-10-CM | POA: Diagnosis not present

## 2024-08-10 DIAGNOSIS — M25512 Pain in left shoulder: Secondary | ICD-10-CM | POA: Diagnosis not present

## 2024-08-13 DIAGNOSIS — M25512 Pain in left shoulder: Secondary | ICD-10-CM | POA: Diagnosis not present

## 2024-08-13 DIAGNOSIS — M75102 Unspecified rotator cuff tear or rupture of left shoulder, not specified as traumatic: Secondary | ICD-10-CM | POA: Diagnosis not present

## 2024-08-14 DIAGNOSIS — H43813 Vitreous degeneration, bilateral: Secondary | ICD-10-CM | POA: Diagnosis not present

## 2024-08-14 DIAGNOSIS — H43393 Other vitreous opacities, bilateral: Secondary | ICD-10-CM | POA: Diagnosis not present

## 2024-08-14 DIAGNOSIS — H353132 Nonexudative age-related macular degeneration, bilateral, intermediate dry stage: Secondary | ICD-10-CM | POA: Diagnosis not present

## 2024-08-14 DIAGNOSIS — H532 Diplopia: Secondary | ICD-10-CM | POA: Diagnosis not present

## 2024-08-14 DIAGNOSIS — H3562 Retinal hemorrhage, left eye: Secondary | ICD-10-CM | POA: Diagnosis not present

## 2024-08-14 DIAGNOSIS — H353221 Exudative age-related macular degeneration, left eye, with active choroidal neovascularization: Secondary | ICD-10-CM | POA: Diagnosis not present

## 2024-08-21 DIAGNOSIS — M25512 Pain in left shoulder: Secondary | ICD-10-CM | POA: Diagnosis not present

## 2024-08-21 DIAGNOSIS — M75102 Unspecified rotator cuff tear or rupture of left shoulder, not specified as traumatic: Secondary | ICD-10-CM | POA: Diagnosis not present

## 2024-08-23 DIAGNOSIS — M75102 Unspecified rotator cuff tear or rupture of left shoulder, not specified as traumatic: Secondary | ICD-10-CM | POA: Diagnosis not present

## 2024-08-23 DIAGNOSIS — M25512 Pain in left shoulder: Secondary | ICD-10-CM | POA: Diagnosis not present

## 2024-08-28 DIAGNOSIS — M75102 Unspecified rotator cuff tear or rupture of left shoulder, not specified as traumatic: Secondary | ICD-10-CM | POA: Diagnosis not present

## 2024-08-28 DIAGNOSIS — M25512 Pain in left shoulder: Secondary | ICD-10-CM | POA: Diagnosis not present

## 2024-08-30 DIAGNOSIS — M75102 Unspecified rotator cuff tear or rupture of left shoulder, not specified as traumatic: Secondary | ICD-10-CM | POA: Diagnosis not present

## 2024-08-30 DIAGNOSIS — M25512 Pain in left shoulder: Secondary | ICD-10-CM | POA: Diagnosis not present

## 2024-10-24 ENCOUNTER — Ambulatory Visit: Payer: Medicare HMO | Admitting: Internal Medicine

## 2024-11-09 ENCOUNTER — Ambulatory Visit: Admitting: Internal Medicine

## 2024-12-06 ENCOUNTER — Ambulatory Visit: Admitting: Cardiology

## 2025-01-18 ENCOUNTER — Ambulatory Visit: Admitting: Cardiology
